# Patient Record
Sex: Female | Born: 1960
Health system: Southern US, Community
[De-identification: ages and names within clinical notes are randomized; demographics above are authoritative.]

## PROBLEM LIST (undated history)

## (undated) DIAGNOSIS — E039 Hypothyroidism, unspecified: Secondary | ICD-10-CM

## (undated) DIAGNOSIS — K604 Rectal fistula, unspecified: Secondary | ICD-10-CM

## (undated) DIAGNOSIS — E079 Disorder of thyroid, unspecified: Secondary | ICD-10-CM

## (undated) DIAGNOSIS — R7989 Other specified abnormal findings of blood chemistry: Secondary | ICD-10-CM

## (undated) DIAGNOSIS — Z5189 Encounter for other specified aftercare: Secondary | ICD-10-CM

## (undated) DIAGNOSIS — N189 Chronic kidney disease, unspecified: Secondary | ICD-10-CM

## (undated) DIAGNOSIS — F319 Bipolar disorder, unspecified: Secondary | ICD-10-CM

## (undated) DIAGNOSIS — A048 Other specified bacterial intestinal infections: Secondary | ICD-10-CM

## (undated) DIAGNOSIS — M7061 Trochanteric bursitis, right hip: Secondary | ICD-10-CM

## (undated) DIAGNOSIS — F32A Depression, unspecified: Secondary | ICD-10-CM

## (undated) HISTORY — DX: Disorder of thyroid, unspecified: E07.9

## (undated) HISTORY — DX: Depression, unspecified: F32.A

## (undated) HISTORY — PX: WISDOM TOOTH EXTRACTION: SHX21

## (undated) HISTORY — PX: HERNIA REPAIR: SHX51

## (undated) HISTORY — PX: LAPAROSCOPIC APPENDECTOMY: SUR753

## (undated) HISTORY — PX: POLYPECTOMY: SHX149

## (undated) HISTORY — PX: VAGINAL HYSTERECTOMY: SUR661

## (undated) HISTORY — DX: Chronic kidney disease, unspecified: N18.9

## (undated) HISTORY — DX: Encounter for other specified aftercare: Z51.89

---

## 2014-06-25 ENCOUNTER — Other Ambulatory Visit: Payer: Self-pay

## 2014-06-25 DIAGNOSIS — R5383 Other fatigue: Secondary | ICD-10-CM | POA: Insufficient documentation

## 2014-06-25 DIAGNOSIS — Z1231 Encounter for screening mammogram for malignant neoplasm of breast: Secondary | ICD-10-CM

## 2014-06-25 DIAGNOSIS — F319 Bipolar disorder, unspecified: Secondary | ICD-10-CM | POA: Insufficient documentation

## 2014-06-25 DIAGNOSIS — M25551 Pain in right hip: Secondary | ICD-10-CM | POA: Insufficient documentation

## 2014-06-25 DIAGNOSIS — M25559 Pain in unspecified hip: Secondary | ICD-10-CM | POA: Insufficient documentation

## 2014-06-25 DIAGNOSIS — E039 Hypothyroidism, unspecified: Secondary | ICD-10-CM | POA: Insufficient documentation

## 2014-06-27 ENCOUNTER — Other Ambulatory Visit: Payer: Self-pay | Admitting: Family Medicine

## 2014-06-27 DIAGNOSIS — R5381 Other malaise: Secondary | ICD-10-CM

## 2014-07-01 ENCOUNTER — Other Ambulatory Visit: Payer: Self-pay | Admitting: Family Medicine

## 2014-07-01 DIAGNOSIS — Z780 Asymptomatic menopausal state: Secondary | ICD-10-CM

## 2014-07-08 ENCOUNTER — Encounter (INDEPENDENT_AMBULATORY_CARE_PROVIDER_SITE_OTHER): Payer: Self-pay

## 2014-07-08 ENCOUNTER — Ambulatory Visit
Admission: RE | Admit: 2014-07-08 | Discharge: 2014-07-08 | Disposition: A | Discharge status: Home / Self Care | Payer: 59 | Source: Ambulatory Visit | Attending: Family Medicine | Admitting: Family Medicine

## 2014-07-08 ENCOUNTER — Ambulatory Visit
Admission: RE | Admit: 2014-07-08 | Discharge: 2014-07-08 | Disposition: A | Discharge status: Home / Self Care | Payer: 59 | Source: Ambulatory Visit

## 2014-07-08 ENCOUNTER — Ambulatory Visit: Payer: Self-pay

## 2014-07-08 DIAGNOSIS — Z780 Asymptomatic menopausal state: Secondary | ICD-10-CM

## 2014-07-08 DIAGNOSIS — Z1231 Encounter for screening mammogram for malignant neoplasm of breast: Secondary | ICD-10-CM

## 2014-07-31 ENCOUNTER — Other Ambulatory Visit: Payer: Self-pay | Admitting: Family Medicine

## 2014-08-26 DIAGNOSIS — R7989 Other specified abnormal findings of blood chemistry: Secondary | ICD-10-CM | POA: Insufficient documentation

## 2014-11-03 ENCOUNTER — Ambulatory Visit (HOSPITAL_BASED_OUTPATIENT_CLINIC_OR_DEPARTMENT_OTHER): Payer: 59 | Attending: Specialist | Admitting: Radiology

## 2014-11-03 VITALS — Ht 69.0 in | Wt 230.0 lb

## 2014-11-03 DIAGNOSIS — R0683 Snoring: Secondary | ICD-10-CM | POA: Insufficient documentation

## 2014-11-08 DIAGNOSIS — R0683 Snoring: Secondary | ICD-10-CM

## 2014-11-08 NOTE — Sleep Study (Signed)
   NAME: Miranda Harris DATE OF BIRTH:  09/17/1961 MEDICAL RECORD NUMBER [id number]  LOCATION: [facility]  PHYSICIAN: Marquize Seib D  DATE OF STUDY: 11/03/2014  SLEEP STUDY TYPE: Nocturnal Polysomnogram               REFERRING PHYSICIAN: Harvie Junior, MD  INDICATION FOR STUDY: Insomnia with sleep apnea  EPWORTH SLEEPINESS SCORE:   1/24 HEIGHT: 5\' 9"  (175.3 cm)  WEIGHT: 230 lb (104.327 kg)    Body mass index is 33.95 kg/(m^2).  NECK SIZE: 14.5 in.  MEDICATIONS: Charted for review  SLEEP ARCHITECTURE: Total sleep time 289 minutes with sleep efficiency 72%. Stage I was 2.1%, stage II 97.9%, stages 3 and REM were absent. Sleep latency 33 minutes, awake after sleep onset 68.5 minutes, arousal index 3.1, bedtime medication: Lithium, Prozac, Stelazine, Xanax, Synthroid  RESPIRATORY DATA: Apnea hypopnea index (AHI) 0.0 per hour. There were no respiratory events meeting scoring criteria.  OXYGEN DATA: Moderate snoring with oxygen desaturation to a nadir of 88% and mean saturation 93.1% on room air  CARDIAC DATA: Normal sinus rhythm  MOVEMENT/PARASOMNIA: No significant movement disturbance, bathroom 3. Reported leg cramps.  IMPRESSION/ RECOMMENDATION:   1) Sleep architecture noteworthy for absence of REM sleep. Sleep was otherwise unremarkable except interrupted 3 for bathroom leg cramps were reported. Bedtime medications as noted above. 2) There were no respiratory events causing sleep disturbance, within normal limits. AHI 0.0 per hour. The normal range for adults is an AHI from 0-5 events per hour. Moderate snoring with oxygen desaturation to a nadir of 88% and mean saturation 93.1% on room air.   Deneise Lever Diplomate, American Board of Sleep Medicine  ELECTRONICALLY SIGNED ON:  11/08/2014, 11:31 AM [place] PH: [phone]   FX: [phone] [place]

## 2014-12-10 DIAGNOSIS — Z8619 Personal history of other infectious and parasitic diseases: Secondary | ICD-10-CM | POA: Insufficient documentation

## 2014-12-10 DIAGNOSIS — Z9289 Personal history of other medical treatment: Secondary | ICD-10-CM | POA: Insufficient documentation

## 2015-04-19 DIAGNOSIS — M7060 Trochanteric bursitis, unspecified hip: Secondary | ICD-10-CM | POA: Insufficient documentation

## 2015-05-20 ENCOUNTER — Encounter: Payer: Self-pay | Admitting: Nurse Practitioner

## 2015-05-26 ENCOUNTER — Ambulatory Visit (INDEPENDENT_AMBULATORY_CARE_PROVIDER_SITE_OTHER): Payer: 59 | Admitting: Psychiatry

## 2015-05-26 ENCOUNTER — Encounter (INDEPENDENT_AMBULATORY_CARE_PROVIDER_SITE_OTHER): Payer: Self-pay

## 2015-05-26 ENCOUNTER — Encounter (HOSPITAL_COMMUNITY): Payer: Self-pay | Admitting: Psychiatry

## 2015-05-26 VITALS — BP 126/80 | HR 68 | Ht 69.0 in | Wt 243.6 lb

## 2015-05-26 DIAGNOSIS — F3160 Bipolar disorder, current episode mixed, unspecified: Secondary | ICD-10-CM | POA: Diagnosis not present

## 2015-05-26 DIAGNOSIS — F419 Anxiety disorder, unspecified: Secondary | ICD-10-CM

## 2015-05-26 DIAGNOSIS — Z79899 Other long term (current) drug therapy: Secondary | ICD-10-CM

## 2015-05-26 DIAGNOSIS — F3161 Bipolar disorder, current episode mixed, mild: Secondary | ICD-10-CM

## 2015-05-26 MED ORDER — TRAZODONE HCL 50 MG PO TABS: ORAL_TABLET | ORAL | Status: DC

## 2015-05-26 MED ORDER — FLUOXETINE HCL 10 MG PO CAPS: ORAL_CAPSULE | ORAL | Status: DC

## 2015-05-26 NOTE — Progress Notes (Signed)
[facility] Initial Assessment Note  Miranda Harris [id number] 54 y.o.  05/26/2015 11:57 AM  Chief Complaint:  I need a new psychiatrist.  My psychiatrist left.  I need medication.  History of Present Illness:  Patient is 54 year old Caucasian, employed, married female who is self-referred for the management of her bipolar disorder and depression.  She was seeing Dr. Candis Sch[facility] at [facility] however her psychiatrist left the practice.  Patient moved from [place] in March 2015 as her husband got a new job in this area.  Patient carry diagnoses of bipolar disorder and depression and she was seeing Dr. Harriette Bouillon in [place] and then Dr. Candis Schatz at [place].  She is taking Xanax , lithium and Stelazine.  She is taking the Stelazine, Prozac and lithium for more than 25 years.  She started taking Xanax four years ago prescribed by her psychiatrist because she could not sleep .  Initially she was taking 2 mg however when she read about Xanax she started cutting down and now she is taking 0.75 mg.  She had tried 0.5 mg which she get very nervous anxious and could not sleep.  Patient is still endorse symptoms of depression, anxiety, low energy, racing thoughts and social isolation but denies any mania or any psychosis.  She is taking Prozac 20 mg, she used to take 40 mg but she felt very restless and decided to take 20 mg.  Patient remember her last psychiatric hospitalization was at age 54 when she was going through severe manic psychosis and depression.  She admitted she was noncompliant with medication and after that she learn the lesson that she has to take the medication.  Says that she's been compliant with lithium and Stelazine.  She's not sure if her lithium level was done recently.  She wants to continue lithium Prozac and Stelazine however she wants to come off from Xanax.  She endorse there are times when she feel sad or depressed mood but denies any feeling of hopelessness or  worthlessness.  She denies any nightmares, flashback, panic attacks, PTSD symptoms or any OCD symptoms.  She lives with her husband who is very supportive.  She has a son who lives in [place].  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  She admitted limited social network and she is trying to make friends .  She goes to primary care physician [name] who is managing her medication for her health needs.  Patient denies any mania in recent years.  Patient denies drinking or using any illegal substances.  Her appetite is okay.  Her vitals are stable.  She has no tremors or shakes.    Suicidal Ideation: No Plan Formed: No Patient has means to carry out plan: No  Homicidal Ideation: No Plan Formed: No Patient has means to carry out plan: No  Past Psychiatric History/Hospitalization(s): Patient remember first hospitalization at age 54 at that time she has severe manic psychosis and depression.  She was hyperverbal, extreme mood swing, sexually promiscuous, paranoia, hallucination and having suicidal thoughts.  She denies any suicidal attempt in the past.  She was admitted in [place] but soon after admission she stopped taking her medication and age 54 she admitted to gain for same symptoms.  Since then she's been taking her medication most of the time.  In the past she had tried Abilify which causes flulike symptoms, Lunesta causes sedation and tired feeling, Ambien which causes sleepwalking, driving and eating and  the night, Haldol and Thorazine which causes EPS and shakes.  She's been taking Stelazine and lithium for more than 25 years and she reported no side effects.  She was seeing Dr. Harriette Bouillon in [place] and Dr. Candis Schatz at [facility].  She was getting Xanax in [place] for insomnia and she was taking 2 mg until recently she has been trying to cut down the dose. Anxiety: Yes Bipolar Disorder: Yes Depression: Yes Mania: Yes Psychosis:  Yes Schizophrenia: No Personality Disorder: No Hospitalization for psychiatric illness: Yes History of Electroconvulsive Shock Therapy: No Prior Suicide Attempts: No  Medical History; Patient has hypothyroidism, bladder problem and chronic pain.  She see [name] for her primary care needs.  Traumatic brain injury: Patient denies any history of traumatic brain injury.  Family History; Patient endorse mother has bipolar-like symptoms are she never diagnosed.  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  Psychosocial History; Patient born and raised in [place].  She lived in [place], [place] and recently [place].  Most of the time she moved due to her husband's job.  Together they have 88 year old son who lives in [place].  Patient recently moved to this area and she has limited social network.  Legal History; Patient denies any legal issues.  History Of Abuse; Patient denies any history of abuse.  Substance Abuse History; Patient endorse distant history of drinking alcohol heavily however claims to be sober for many years.  She denies any withdrawal symptoms including tremors, shakes or any seizures.  Review of Systems: Psychiatric: Agitation: No Hallucination: No Depressed Mood: Yes Insomnia: No Hypersomnia: No Altered Concentration: No Feels Worthless: No Grandiose Ideas: No Belief In Special Powers: No New/Increased Substance Abuse: No Compulsions: No  Neurologic: Headache: No Seizure: No Paresthesias: No   Outpatient Encounter Prescriptions as of 05/26/2015  Medication Sig  . FLUoxetine (PROZAC) 10 MG capsule Take 3 capsule daily  . ibuprofen (ADVIL,MOTRIN) 800 MG tablet Take 800 mg by mouth every 8 (eight) hours as needed. for pain  . levothyroxine (SYNTHROID, LEVOTHROID) 150 MCG tablet   . lithium 300 MG tablet Take 1,200 mg by mouth at bedtime.  . traZODone (DESYREL) 50 MG  tablet Take 1/2 to 1 tab at bed time  . trifluoperazine (STELAZINE) 2 MG tablet Take 2 mg by mouth at bedtime.  . [DISCONTINUED] FLUoxetine (PROZAC) 20 MG capsule Take 20 mg by mouth at bedtime.   No facility-administered encounter medications on file as of 05/26/2015.    No results found for this or any previous visit (from the past 2160 hour(s)).    Constitutional:  BP 126/80 mmHg  Pulse 68  Ht 5\' 9"  (1.753 m)  Wt 243 lb 9.6 oz (110.496 kg)  BMI 35.96 kg/m2   Musculoskeletal: Strength & Muscle Tone: within normal limits Gait & Station: normal Patient leans: N/A  Psychiatric Specialty Exam: General Appearance: Casual  Eye Contact::  Good  Speech:  Slow  Volume:  Decreased  Mood:  Anxious and Depressed  Affect:  Congruent  Thought Process:  Coherent  Orientation:  Full (Time, Place, and Person)  Thought Content:  WDL  Suicidal Thoughts:  No  Homicidal Thoughts:  No  Memory:  Immediate;   Fair Recent;   Fair Remote;   Fair  Judgement:  Fair  Insight:  Good  Psychomotor Activity:  Normal  Concentration:  Good  Recall:  Good  Fund of Knowledge:  Good  Language:  Good  Akathisia:  No  Handed:  Right  AIMS (if indicated):     Assets:  Communication Skills Desire for Improvement Financial Resources/Insurance Housing Physical Health Social Support  ADL's:  Intact  Cognition:  WNL  Sleep:        Established Problem, Stable/Improving (1), Review of Psycho-Social Stressors (1), Review or order clinical lab tests (1), Decision to obtain old records (1), Review and summation of old records (2), Established Problem, Worsening (2), New Problem, with no additional work-up planned (3), Review of Medication Regimen & Side Effects (2) and Review of New Medication or Change in Dosage (2)  Assessment: Axis I: Bipolar disorder, mixed.  Anxiety disorder NOS.  Axis II: Deferred  Axis III:  Past Medical History  Diagnosis Date  . Thyroid disease      Plan:  I review  her symptoms, history, psychosocial stressors, current medication, blood work from primary care physician.  She has mild elevation of BUN however there were no lithium level done.  CBC is normal.  Discussed medication in detail.  Patient wants to come off from Xanax.  Currently she is taking Xanax 0.75 mg, she used to take 2 mg and now she read about benzodiazepine dependence she wants to come off slowly.  She had never tried trazodone before.  In the past she had tried Ambien, Lunesta, but side effects.  I recommended to try trazodone 50 mg half to one tablet to help insomnia and anxiety.  I also recommended increased Prozac 30 mg since she had tried 40 mg causing restlessness.  At this time continue lithium 1200 mg a day.  Patient has no side effects however we will do a lithium level along with him ability A1c and comprehensive metabolic panel.  Encourage to think about seeing a therapist but patient like to take some time to make a decision.  Discussed medication side effects and benefits.  We will get records from Dr. Candis Schatz.  Recommended to call us back if she has any question or any concern.  I will see her again in 3 weeks.  Discuss safety plan that anytime having active suicidal thoughts or homicidal thoughts and she need to call 911 or go to the local emergency room.  Zarek Relph T., MD 05/26/2015

## 2015-06-02 ENCOUNTER — Ambulatory Visit (HOSPITAL_COMMUNITY): Payer: 59 | Admitting: Psychiatry

## 2015-06-11 LAB — HEMOGLOBIN A1C
Hgb A1c MFr Bld: 5.4 % (ref <5.7)
MEAN PLASMA GLUCOSE: 108 mg/dL (ref <117)

## 2015-06-11 LAB — LITHIUM LEVEL: LITHIUM LVL: 0.8 meq/L (ref 0.80–1.40)

## 2015-06-12 LAB — COMPLETE METABOLIC PANEL WITH GFR
ALT: 20 U/L (ref 6–29)
AST: 20 U/L (ref 10–35)
Albumin: 4 g/dL (ref 3.6–5.1)
Alkaline Phosphatase: 79 U/L (ref 33–130)
BUN: 19 mg/dL (ref 7–25)
CHLORIDE: 109 mmol/L (ref 98–110)
CO2: 22 mmol/L (ref 20–31)
Calcium: 10.1 mg/dL (ref 8.6–10.4)
Creat: 1.04 mg/dL (ref 0.50–1.05)
GFR, EST NON AFRICAN AMERICAN: 61 mL/min (ref >=60)
GFR, Est African American: 70 mL/min (ref >=60)
GLUCOSE: 76 mg/dL (ref 65–99)
POTASSIUM: 4.6 mmol/L (ref 3.5–5.3)
SODIUM: 138 mmol/L (ref 135–146)
Total Bilirubin: 0.4 mg/dL (ref 0.2–1.2)
Total Protein: 6.2 g/dL (ref 6.1–8.1)

## 2015-06-19 ENCOUNTER — Ambulatory Visit (INDEPENDENT_AMBULATORY_CARE_PROVIDER_SITE_OTHER): Payer: 59 | Admitting: Psychiatry

## 2015-06-19 ENCOUNTER — Encounter (HOSPITAL_COMMUNITY): Payer: Self-pay | Admitting: Psychiatry

## 2015-06-19 VITALS — BP 122/84 | HR 63 | Ht 69.0 in | Wt 245.4 lb

## 2015-06-19 DIAGNOSIS — F419 Anxiety disorder, unspecified: Secondary | ICD-10-CM | POA: Diagnosis not present

## 2015-06-19 DIAGNOSIS — F3160 Bipolar disorder, current episode mixed, unspecified: Secondary | ICD-10-CM | POA: Diagnosis not present

## 2015-06-19 DIAGNOSIS — F3161 Bipolar disorder, current episode mixed, mild: Secondary | ICD-10-CM

## 2015-06-19 MED ORDER — ALPRAZOLAM 0.5 MG PO TABS: 0.5000 mg | ORAL_TABLET | Freq: Every evening | ORAL | Status: DC | PRN

## 2015-06-19 MED ORDER — HYDROXYZINE HCL 25 MG PO TABS: 25.0000 mg | ORAL_TABLET | Freq: Every day | ORAL | Status: DC

## 2015-06-19 MED ORDER — TRIFLUOPERAZINE HCL 2 MG PO TABS: 2.0000 mg | ORAL_TABLET | Freq: Every day | ORAL | Status: DC

## 2015-06-19 MED ORDER — FLUOXETINE HCL 10 MG PO CAPS: ORAL_CAPSULE | ORAL | Status: DC

## 2015-06-19 MED ORDER — LITHIUM CARBONATE 300 MG PO TABS: 1200.0000 mg | ORAL_TABLET | Freq: Every day | ORAL | Status: DC

## 2015-06-19 NOTE — Progress Notes (Signed)
Miranda Harris [phone] Progress Note  Miranda Harris [id number] 54 y.o.  06/19/2015 12:09 PM  Chief Complaint:  I cannot take trazodone.  It is making him very drowsy in the morning.    History of Present Illness:  Miranda Harris came for her follow-up appointment.  She is a 54 year old Caucasian employed married female who was seen first time on August 30 as initial evaluation.  Patient has history of bipolar disorder and she was seeing Dr. Candis Schatz who recently left the practice.  She was taking Xanax 0.75 mg, lithium 1200 mg, Stelazine 2 mg and Prozac 20 mg.  Despite taking these medication she continued to have anxiety, depression, sadness, decreased energy and racing thoughts.  We have suggested to increase Prozac 30 mg.  She wanted to stop Xanax but she was worried about her poor sleep and we recommended to try trazodone.  She tried trazodone 50 mg half tablet but she complained excessive sedation and drowsiness in the morning.  She tried for 7 days and she could not tolerate and decided to stop the trazodone.  She restarted taking Xanax 0.75 mg and she is not happy about it.  She wants to stop because she felt that she is getting addicted and feeling dependent on Xanax.  In the past she had tried Ambien and Lunesta but it causes sleepwalking, driving and eating in the nighttime.  She like increase Prozac because it helped her depression and energy level.  She also had blood work and her lithium level is 0.80, hemoglobin A1c 5.4 and her basic chemistry is normal.  Patient denies any paranoia or any hallucination.  She lives with her husband who is very supportive.  Her son lives in [place].  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  Patient denies any feeling of hopelessness or worthlessness.  Patient denies any hallucination paranoia or any active or passive suicidal thoughts.  Her appetite is  okay.  Her vitals are stable.  Suicidal Ideation: No Plan Formed: No Patient has means to carry out plan: No  Homicidal Ideation: No Plan Formed: No Patient has means to carry out plan: No  Past Psychiatric History/Hospitalization(s): Patient has hospitalization at age 26 due to mania and psychosis and depression.  She was hyperverbal, extreme mood swing, sexually promiscuous, paranoia, hallucination and having suicidal thoughts.  She has another episode at age 68 due to noncompliance with medication.  Since then she is taking her medication most of the time.  In the past she had tried Abilify which causes flulike symptoms, Lunesta causes sedation and tired feeling, Ambien which causes sleepwalking, driving and eating and the night, Haldol and Thorazine which causes EPS and shakes.  She's been taking Stelazine and lithium for more than 25 years and she reported no side effects.  She was seeing Dr. Harriette Bouillon in [place] and Dr. [facility]is Schatz at [facility].  She was getting Xanax in [place] for insomnia and she was taking 2 mg until recently she has been trying to cut down the dose. Anxiety: Yes Bipolar Disorder: Yes Depression: Yes Mania: Yes Psychosis: Yes Schizophrenia: No Personality Disorder: No Hospitalization for psychiatric illness: Yes History of Electroconvulsive Shock Therapy: No Prior Suicide Attempts: No  Medical History; Patient has hypothyroidism, bladder problem and chronic pain.  She see [name] for her primary care needs.  Family History; Patient endorse mother has bipolar-like symptoms are she never diagnosed.  Substance  Abuse History; Patient endorse distant history of drinking alcohol heavily however claims to be sober for many years.  She denies any withdrawal symptoms including tremors, shakes or any seizures.  Review of Systems: Psychiatric: Agitation: No Hallucination: No Depressed Mood: No Insomnia: No Hypersomnia: No Altered Concentration: No Feels  Worthless: No Grandiose Ideas: No Belief In Special Powers: No New/Increased Substance Abuse: No Compulsions: No  Neurologic: Headache: No Seizure: No Paresthesias: No   Outpatient Encounter Prescriptions as of 06/19/2015  Medication Sig  . FLUoxetine (PROZAC) 10 MG capsule Take 3 capsule daily  . ibuprofen (ADVIL,MOTRIN) 800 MG tablet Take 800 mg by mouth every 8 (eight) hours as needed. for pain  . levothyroxine (SYNTHROID, LEVOTHROID) 150 MCG tablet   . lithium 300 MG tablet Take 4 tablets (1,200 mg total) by mouth at bedtime.  [name] trifluoperazine (STELAZINE) 2 MG tablet Take 1 tablet (2 mg total) by mouth at bedtime.  . [DISCONTINUED] FLUoxetine (PROZAC) 10 MG capsule Take 3 capsule daily  . [DISCONTINUED] lithium 300 MG tablet Take 1,200 mg by mouth at bedtime.  . [DISCONTINUED] trifluoperazine (STELAZINE) 2 MG tablet Take 2 mg by mouth at bedtime.  . ALPRAZolam (XANAX) 0.5 MG tablet Take 1 tablet (0.5 mg total) by mouth at bedtime as needed for anxiety.  . hydrOXYzine (ATARAX/VISTARIL) 25 MG tablet Take 1 tablet (25 mg total) by mouth at bedtime.  . [DISCONTINUED] traZODone (DESYREL) 50 MG tablet Take 1/2 to 1 tab at bed time (Patient not taking: Reported on 06/19/2015)   No facility-administered encounter medications on file as of 06/19/2015.    Recent Results (from the past 2160 hour(s))  Lithium level     Status: None   Collection Time: 06/10/15 11:56 AM  Result Value Ref Range   Lithium Lvl 0.80 0.80 - 1.40 mEq/L    Comment:   Footnotes:  (1) ** Please note change in unit of measure and reference range(s). **     Hemoglobin A1c     Status: None   Collection Time: 06/10/15 11:56 AM  Result Value Ref Range   Hgb A1c MFr Bld 5.4 <5.7 %    Comment:                                                                        According to the ADA Clinical Practice Recommendations for 2011, when HbA1c is used as a screening test:     >=6.5%   Diagnostic of Diabetes Mellitus             (if abnormal result is confirmed)   5.7-6.4%   Increased risk of developing Diabetes Mellitus   References:Diagnosis and Classification of Diabetes Mellitus,Diabetes ZYYQ,8250,03(BCWUG 1):S62-S69 and Standards of Medical Care in         Diabetes - 2011,Diabetes Care,2011,34 (Suppl 1):S11-S61.      Mean Plasma Glucose 108 <117 mg/dL  COMPLETE METABOLIC PANEL WITH GFR     Status: None   Collection Time: 06/10/15 11:56 AM  Result Value Ref Range   Sodium 138 135 - 146 mmol/L   Potassium 4.6 3.5 - 5.3 mmol/L   Chloride 109 98 - 110 mmol/L   CO2 22 20 - 31 mmol/L   Glucose, Bld 76 65 -  99 mg/dL   BUN 19 7 - 25 mg/dL   Creat 1.04 0.50 - 1.05 mg/dL   Total Bilirubin 0.4 0.2 - 1.2 mg/dL   Alkaline Phosphatase 79 33 - 130 U/L   AST 20 10 - 35 U/L   ALT 20 6 - 29 U/L   Total Protein 6.2 6.1 - 8.1 g/dL   Albumin 4.0 3.6 - 5.1 g/dL   Calcium 10.1 8.6 - 10.4 mg/dL   GFR, Est African American 70 >=60 mL/min   GFR, Est Non African American 61 >=60 mL/min    Comment:   The estimated GFR is a calculation valid for adults (>=45 years old) that uses the CKD-EPI algorithm to adjust for age and sex. It is   not to be used for children, pregnant women, hospitalized patients,    patients on dialysis, or with rapidly changing kidney function. According to the NKDEP, eGFR >89 is normal, 60-89 shows mild impairment, 30-59 shows moderate impairment, 15-29 shows severe impairment and <15 is ESRD.         Constitutional:  BP 122/84 mmHg  Pulse 63  Ht 5' 9"  (1.753 m)  Wt 245 lb 6.4 oz (111.313 kg)  BMI 36.22 kg/m2   Musculoskeletal: Strength & Muscle Tone: within normal limits Gait & Station: normal Patient leans: N/A  Psychiatric Specialty Exam: General Appearance: Casual  Eye Contact::  Good  Speech:  Slow  Volume:  Decreased  Mood:  Anxious  Affect:  Congruent  Thought Process:  Coherent  Orientation:  Full (Time, Place, and Person)  Thought Content:  WDL  Suicidal  Thoughts:  No  Homicidal Thoughts:  No  Memory:  Immediate;   Fair Recent;   Fair Remote;   Fair  Judgement:  Fair  Insight:  Good  Psychomotor Activity:  Normal  Concentration:  Good  Recall:  Good  Fund of Knowledge:  Good  Language:  Good  Akathisia:  No  Handed:  Right  AIMS (if indicated):     Assets:  Communication Skills Desire for Improvement Financial Resources/Insurance Housing Physical Health Social Support  ADL's:  Intact  Cognition:  WNL  Sleep:        Established Problem, Stable/Improving (1), Review of Psycho-Social Stressors (1), Review or order clinical lab tests (1), Review and summation of old records (2), Review of Last Therapy Session (1), Review of Medication Regimen & Side Effects (2) and Review of New Medication or Change in Dosage (2)  Assessment: Axis I: Bipolar disorder, mixed.  Anxiety disorder NOS.  Axis II: Deferred  Axis III:  Past Medical History  Diagnosis Date  . Thyroid disease      Plan:  I review her chart, current medication and blood work results.  Her lithium level is normal.  Her hemoglobin A1c and CBC chemistry is also normal.  She has no tremors, shakes or any EPS.  We have a long and lengthy discussion about benzodiazepine dependence and tolerance.  Patient like to come off from Xanax however she need something to help her sleep at night.  We tried trazodone but it costs too with sedation in the morning and interfere with her job.  I will discontinue trazodone and we will try low-dose Vistaril which she has never tried before.  We will start 25 mg at bedtime.  I also recommended if she cannot sleep with the Vistaril that she can take 0.5 mg Xanax for now.  I explained that she need to come off slowly from benzodiazepine.  Continue lithium 1200 mg daily, Stelazine 2 mg at bedtime and Prozac 30 mg daily.  Recommended to call us back if she has any question or any concern.  At this time patient is not interested to see a counselor.   We are still awaiting records from Dr. Dayle Points office.  follow-up in 4 weeks.  Discuss safety plan that anytime having active suicidal thoughts or homicidal thoughts and she need to call 911 or go to the local emergency room.  ARFEEN,SYED T., MD 06/19/2015

## 2015-06-25 ENCOUNTER — Other Ambulatory Visit (HOSPITAL_COMMUNITY): Payer: Self-pay | Admitting: Psychiatry

## 2015-06-25 NOTE — Telephone Encounter (Signed)
It was discontinued on 06/19/15.

## 2015-07-14 ENCOUNTER — Encounter (HOSPITAL_COMMUNITY): Payer: Self-pay | Admitting: Psychiatry

## 2015-07-14 ENCOUNTER — Ambulatory Visit (INDEPENDENT_AMBULATORY_CARE_PROVIDER_SITE_OTHER): Payer: 59 | Admitting: Psychiatry

## 2015-07-14 VITALS — BP 130/84 | HR 75 | Ht 69.0 in | Wt 248.6 lb

## 2015-07-14 DIAGNOSIS — F3161 Bipolar disorder, current episode mixed, mild: Secondary | ICD-10-CM

## 2015-07-14 DIAGNOSIS — F419 Anxiety disorder, unspecified: Secondary | ICD-10-CM

## 2015-07-14 MED ORDER — ALPRAZOLAM 0.5 MG PO TABS: 0.5000 mg | ORAL_TABLET | Freq: Every evening | ORAL | Status: DC | PRN

## 2015-07-14 MED ORDER — HYDROXYZINE HCL 25 MG PO TABS: ORAL_TABLET | ORAL | Status: DC

## 2015-07-14 MED ORDER — LITHIUM CARBONATE 300 MG PO TABS: 1200.0000 mg | ORAL_TABLET | Freq: Every day | ORAL | Status: DC

## 2015-07-14 MED ORDER — FLUOXETINE HCL 10 MG PO CAPS: ORAL_CAPSULE | ORAL | Status: DC

## 2015-07-14 MED ORDER — TRIFLUOPERAZINE HCL 2 MG PO TABS: 2.0000 mg | ORAL_TABLET | Freq: Every day | ORAL | Status: DC

## 2015-07-14 NOTE — Progress Notes (Signed)
[facility] Progress Note  Miranda Harris [id number] 54 y.o.  07/14/2015 4:35 PM  Chief Complaint:  I tried Vistaril 50 mg but it did not help sleep.  I still have to take Xanax.  I want to come off from Xanax.   History of Present Illness:  Miranda Harris came for her follow-up appointment.  On her last visit we recommended to try Vistaril which she took up to 50 mg along with Xanax 0.5 mg but she did not see any improvement.  However she did not have any side effects either.  She wants to come off from Xanax.  She denies any paranoia, hallucination, irritability.  She admitted anxiety and racing thoughts because of insomnia.  She denies any side effects of medication.  In the past she had tried multiple medication with limited response.  She like increase Prozac which is helping her depression.  She has no side effects.  She denies any feeling of hopelessness or worthlessness.  Her job is going very well.  She denies any hallucination or any paranoia.  She denies any mania or any suicidal thoughts.  She is compliant with Stelazine.  Her vitals are okay.  Her appetite is okay.  She denies drinking or using any illegal substances.  Suicidal Ideation: No Plan Formed: No Patient has means to carry out plan: No  Homicidal Ideation: No Plan Formed: No Patient has means to carry out plan: No  Past Psychiatric History/Hospitalization(s): Patient has hospitalization at age 47 due to mania and psychosis and depression.  She was hyperverbal, extreme mood swing, sexually promiscuous, paranoia, hallucination and having suicidal thoughts.  She has another episode at age 89 due to noncompliance with medication.  Since then she is taking her medication most of the time.  In the past she had tried Abilify which causes flulike symptoms, Lunesta causes sedation and tired feeling, Ambien which causes sleepwalking, driving and eating and the night, Haldol and Thorazine which causes EPS and shakes and  trazodone which cause excessive sedation.[name]  She's been taking Stelazine and lithium for more than 25 years and she reported no side effects.  She was seeing Dr. Harriette Bouillon in [place] and [facility].  She was getting Xanax in [place] for insomnia and she was taking 2 mg until recently she has been trying to cut down the dose. Anxiety: Yes Bipolar Disorder: Yes Depression: Yes Mania: Yes Psychosis: Yes Schizophrenia: No Personality Disorder: No Hospitalization for psychiatric illness: Yes History of Electroconvulsive Shock Therapy: No Prior Suicide Attempts: No  Medical History; Patient has hypothyroidism, bladder problem and chronic pain.  She see [name] for her primary care needs.  Family History; Patient endorse mother has bipolar-like symptoms are she never diagnosed.  Substance Abuse History; Patient endorse distant history of drinking alcohol heavily however claims to be sober for many years.  She denies any withdrawal symptoms including tremors, shakes or any seizures.  Review of Systems: Psychiatric: Agitation: No Hallucination: No Depressed Mood: No Insomnia: No Hypersomnia: No Altered Concentration: No Feels Worthless: No Grandiose Ideas: No Belief In Special Powers: No New/Increased Substance Abuse: No Compulsions: No  Neurologic: Headache: No Seizure: No Paresthesias: No   Outpatient Encounter Prescriptions as of 07/14/2015  Medication Sig  . ALPRAZolam (XANAX) 0.5 MG tablet Take 1 tablet (0.5 mg total) by mouth at bedtime as needed for anxiety.  [name] FLUoxetine (PROZAC) 10 MG capsule Take 3 capsule daily  . hydrOXYzine (ATARAX/VISTARIL) 25 MG tablet Take 3-4 at bed time  .  ibuprofen (ADVIL,MOTRIN) 800 MG tablet Take 800 mg by mouth every 8 (eight) hours as needed. for pain  . levothyroxine (SYNTHROID, LEVOTHROID) 150 MCG tablet   . lithium 300 MG tablet Take 4 tablets (1,200 mg total) by mouth at bedtime.  [name] trifluoperazine  (STELAZINE) 2 MG tablet Take 1 tablet (2 mg total) by mouth at bedtime.  . [DISCONTINUED] ALPRAZolam (XANAX) 0.5 MG tablet Take 1 tablet (0.5 mg total) by mouth at bedtime as needed for anxiety.  . [DISCONTINUED] FLUoxetine (PROZAC) 10 MG capsule Take 3 capsule daily  . [DISCONTINUED] hydrOXYzine (ATARAX/VISTARIL) 25 MG tablet Take 1 tablet (25 mg total) by mouth at bedtime.  . [DISCONTINUED] lithium 300 MG tablet Take 4 tablets (1,200 mg total) by mouth at bedtime.  . [DISCONTINUED] trifluoperazine (STELAZINE) 2 MG tablet Take 1 tablet (2 mg total) by mouth at bedtime.   No facility-administered encounter medications on file as of 07/14/2015.    Recent Results (from the past 2160 hour(s))  Lithium level     Status: None   Collection Time: 06/10/15 11:56 AM  Result Value Ref Range   Lithium Lvl 0.80 0.80 - 1.40 mEq/L    Comment:   Footnotes:  (1) ** Please note change in unit of measure and reference range(s). **     Hemoglobin A1c     Status: None   Collection Time: 06/10/15 11:56 AM  Result Value Ref Range   Hgb A1c MFr Bld 5.4 <5.7 %    Comment:                                                                        According to the ADA Clinical Practice Recommendations for 2011, when HbA1c is used as a screening test:     >=6.5%   Diagnostic of Diabetes Mellitus            (if abnormal result is confirmed)   5.7-6.4%   Increased risk of developing Diabetes Mellitus   References:Diagnosis and Classification of Diabetes Mellitus,Diabetes LAGT,3646,80(HOZYY 1):S62-S69 and Standards of Medical Care in         Diabetes - 2011,Diabetes Care,2011,34 (Suppl 1):S11-S61.      Mean Plasma Glucose 108 <117 mg/dL  COMPLETE METABOLIC PANEL WITH GFR     Status: None   Collection Time: 06/10/15 11:56 AM  Result Value Ref Range   Sodium 138 135 - 146 mmol/L   Potassium 4.6 3.5 - 5.3 mmol/L   Chloride 109 98 - 110 mmol/L   CO2 22 20 - 31 mmol/L   Glucose, Bld 76 65 - 99 mg/dL   BUN  19 7 - 25 mg/dL   Creat 1.04 0.50 - 1.05 mg/dL   Total Bilirubin 0.4 0.2 - 1.2 mg/dL   Alkaline Phosphatase 79 33 - 130 U/L   AST 20 10 - 35 U/L   ALT 20 6 - 29 U/L   Total Protein 6.2 6.1 - 8.1 g/dL   Albumin 4.0 3.6 - 5.1 g/dL   Calcium 10.1 8.6 - 10.4 mg/dL   GFR, Est African American 70 >=60 mL/min   GFR, Est Non African American 61 >=60 mL/min    Comment:   The estimated GFR is a calculation valid for adults (>=18  years old) that uses the CKD-EPI algorithm to adjust for age and sex. It is   not to be used for children, pregnant women, hospitalized patients,    patients on dialysis, or with rapidly changing kidney function. According to the NKDEP, eGFR >89 is normal, 60-89 shows mild impairment, 30-59 shows moderate impairment, 15-29 shows severe impairment and <15 is ESRD.         Constitutional:  BP 130/84 mmHg  Pulse 75  Ht 5' 9" (1.753 m)  Wt 248 lb 9.6 oz (112.764 kg)  BMI 36.69 kg/m2   Musculoskeletal: Strength & Muscle Tone: within normal limits Gait & Station: normal Patient leans: N/A  Psychiatric Specialty Exam: General Appearance: Casual  Eye Contact::  Good  Speech:  Slow  Volume:  Decreased  Mood:  Anxious  Affect:  Congruent  Thought Process:  Coherent  Orientation:  Full (Time, Place, and Person)  Thought Content:  WDL  Suicidal Thoughts:  No  Homicidal Thoughts:  No  Memory:  Immediate;   Fair Recent;   Fair Remote;   Fair  Judgement:  Fair  Insight:  Good  Psychomotor Activity:  Normal  Concentration:  Good  Recall:  Good  Fund of Knowledge:  Good  Language:  Good  Akathisia:  No  Handed:  Right  AIMS (if indicated):     Assets:  Communication Skills Desire for Improvement Financial Resources/Insurance Housing Physical Health Social Support  ADL's:  Intact  Cognition:  WNL  Sleep:        Established Problem, Stable/Improving (1), Review of Psycho-Social Stressors (1), Review of Last Therapy Session (1), Review of  Medication Regimen & Side Effects (2) and Review of New Medication or Change in Dosage (2)  Assessment: Axis I: Bipolar disorder, mixed.  Anxiety disorder NOS.  Axis II: Deferred  Axis III:  Past Medical History  Diagnosis Date  . Thyroid disease      Plan:  I recommended to increase Vistaril up to 75-100 mg at bedtime and takes Xanax 0.5 mg half to one tablet as needed.  Discussed benzodiazepine withdrawals, dependence and tolerance.  She wants to come off from Xanax eventually.  Continue lithium 1200 mg daily, Stelazine 2 mg at bedtime and Prozac 30 mg daily.  Recommended to call us back if she has any question or any concern.  At this time patient is not interested to see a counselor.  Follow-up in 6 weeks.  Discuss safety plan that anytime having active suicidal thoughts or homicidal thoughts and she need to call 911 or go to the local emergency room.  Kelty Szafran T., MD 07/14/2015

## 2015-07-16 ENCOUNTER — Telehealth (HOSPITAL_COMMUNITY): Payer: Self-pay

## 2015-07-16 NOTE — Telephone Encounter (Signed)
Medication management - Telephone call with pt. to follow up on stated needed new Alprazolam prescription.  Informed pt. this was given to her on 07/14/15 when evaluated and pt. stated she was not sure if she had thrown it away by mistake.  Informed patient she should look for the prescription and let this nurse know if it is not found.  Agreed would inform Dr. Adele Schilder she is looking for the prescription and would call our office back.

## 2015-07-20 ENCOUNTER — Ambulatory Visit (HOSPITAL_COMMUNITY): Payer: Self-pay | Admitting: Psychiatry

## 2015-08-17 ENCOUNTER — Other Ambulatory Visit: Payer: Self-pay

## 2015-08-17 DIAGNOSIS — Z1231 Encounter for screening mammogram for malignant neoplasm of breast: Secondary | ICD-10-CM

## 2015-08-26 ENCOUNTER — Encounter (HOSPITAL_COMMUNITY): Payer: Self-pay | Admitting: Psychiatry

## 2015-08-26 ENCOUNTER — Ambulatory Visit (INDEPENDENT_AMBULATORY_CARE_PROVIDER_SITE_OTHER): Payer: 59 | Admitting: Psychiatry

## 2015-08-26 VITALS — BP 131/78 | HR 98 | Ht 69.0 in | Wt 240.0 lb

## 2015-08-26 DIAGNOSIS — F419 Anxiety disorder, unspecified: Secondary | ICD-10-CM

## 2015-08-26 DIAGNOSIS — F3161 Bipolar disorder, current episode mixed, mild: Secondary | ICD-10-CM | POA: Diagnosis not present

## 2015-08-26 MED ORDER — ALPRAZOLAM 0.5 MG PO TABS: 0.5000 mg | ORAL_TABLET | Freq: Every evening | ORAL | Status: DC | PRN

## 2015-08-26 MED ORDER — ESCITALOPRAM OXALATE 10 MG PO TABS
10.0000 mg | ORAL_TABLET | Freq: Every day | ORAL | Status: DC
Start: 2015-08-26 — End: 2015-10-07

## 2015-08-26 MED ORDER — LITHIUM CARBONATE 300 MG PO TABS: 1200.0000 mg | ORAL_TABLET | Freq: Every day | ORAL | Status: DC

## 2015-08-26 MED ORDER — HYDROXYZINE HCL 25 MG PO TABS: ORAL_TABLET | ORAL | Status: DC

## 2015-08-26 MED ORDER — TRIFLUOPERAZINE HCL 2 MG PO TABS: 2.0000 mg | ORAL_TABLET | Freq: Every day | ORAL | Status: DC

## 2015-08-26 NOTE — Progress Notes (Signed)
[facility] [phone] Progress Note  Miranda Harris [id number] 54 y.o.  08/26/2015 5:11 PM  Chief Complaint:  I tried coming off from Xanax but I cannot sleep.  I think I'm getting depressed.  I don't think Prozac working.    History of Present Illness:  Miranda Harris came for her follow-up appointment.  She tried coming off from Xanax but she could not sleep and decided to have hallucination and she got scared and now she is taking Xanax 0.5 mg along with Vistaril 75 mg at bedtime.  She is relieved that her sleep is improved but she still have depression and having crying spells.  Sometimes she feel isolated, withdrawn and hopeless.  She is not sure what trigger the depression .  She admitted some time having passive and fleeting suicidal thoughts but denies any plan or any intent.  She has no concerned about her job which is going very well.  She had a good Thanksgiving.  She has no tremors or shakes.  She does not feel Prozac working and she is willing to try a different antidepressant.  Her appetite is okay.  Her vitals are stable.  She denies any mania or any delusions.  Patient denies drinking or using any illegal substances.    Suicidal Ideation: Patient endorse fleeting and passive suicidal thoughts but no plan. Plan Formed: No Patient has means to carry out plan: No  Homicidal Ideation: No Plan Formed: No Patient has means to carry out plan: No  Past Psychiatric History/Hospitalization(s): Patient has hospitalization at age 34 due to mania and psychosis and depression.  She was hyperverbal, extreme mood swing, sexually promiscuous, paranoia, hallucination and having suicidal thoughts.  She has another episode at age 56 due to noncompliance with medication.  Since then she is taking her medication most of the time.  In the past she had tried Abilify which causes flulike symptoms, Lunesta causes sedation and tired feeling, Ambien which causes sleepwalking, driving and eating and the night,  Haldol and Thorazine which causes EPS and shakes and trazodone which cause excessive sedation.[name]  She's been taking Stelazine and lithium for more than 25 years and she reported no side effects.  She had tried Cymbalta, Zoloft and Wellbutrin but limited response.  She was seeing Dr. Harriette Bouillon in [place] and [facility].  She was getting Xanax in [place] for insomnia and she was taking 2 mg until recently she has been trying to cut down the dose. Anxiety: Yes Bipolar Disorder: Yes Depression: Yes Mania: Yes Psychosis: Yes Schizophrenia: No Personality Disorder: No Hospitalization for psychiatric illness: Yes History of Electroconvulsive Shock Therapy: No Prior Suicide Attempts: No  Medical History; Patient has hypothyroidism, bladder problem and chronic pain.  She see [name] for her primary care needs.  Family History; Patient endorse mother has bipolar-like symptoms but never diagnosed.  Substance Abuse History; Patient endorse distant history of drinking alcohol heavily however claims to be sober for many years.  She denies any withdrawal symptoms including tremors, shakes or any seizures.  Review of Systems  Constitutional: Negative.   Cardiovascular: Negative for chest pain.  Musculoskeletal: Negative.   Skin: Negative for itching and rash.  Neurological: Negative for dizziness, tremors and headaches.  Psychiatric/Behavioral: Positive for depression. The patient has insomnia.     Psychiatric: Agitation: No Hallucination: No Depressed Mood: Yes Insomnia: No Hypersomnia: No Altered Concentration: No Feels Worthless: Yes Grandiose Ideas: No Belief In Special Powers: No New/Increased Substance Abuse: No Compulsions: No  Neurologic:  Headache: No Seizure: No Paresthesias: No   Outpatient Encounter Prescriptions as of 08/26/2015  Medication Sig  . ALPRAZolam (XANAX) 0.5 MG tablet Take 1 tablet (0.5 mg total) by mouth at bedtime as needed for  anxiety.  [name] escitalopram (LEXAPRO) 10 MG tablet Take 1 tablet (10 mg total) by mouth daily.  . hydrOXYzine (ATARAX/VISTARIL) 25 MG tablet Take 3-4 at bed time  . ibuprofen (ADVIL,MOTRIN) 800 MG tablet Take 800 mg by mouth every 8 (eight) hours as needed. for pain  . levothyroxine (SYNTHROID, LEVOTHROID) 150 MCG tablet   . lithium 300 MG tablet Take 4 tablets (1,200 mg total) by mouth at bedtime.  [name] trifluoperazine (STELAZINE) 2 MG tablet Take 1 tablet (2 mg total) by mouth at bedtime.  . [DISCONTINUED] ALPRAZolam (XANAX) 0.5 MG tablet Take 1 tablet (0.5 mg total) by mouth at bedtime as needed for anxiety.  . [DISCONTINUED] FLUoxetine (PROZAC) 10 MG capsule Take 3 capsule daily  . [DISCONTINUED] hydrOXYzine (ATARAX/VISTARIL) 25 MG tablet Take 3-4 at bed time  . [DISCONTINUED] lithium 300 MG tablet Take 4 tablets (1,200 mg total) by mouth at bedtime.  . [DISCONTINUED] trifluoperazine (STELAZINE) 2 MG tablet Take 1 tablet (2 mg total) by mouth at bedtime.   No facility-administered encounter medications on file as of 08/26/2015.    Recent Results (from the past 2160 hour(s))  Lithium level     Status: None   Collection Time: 06/10/15 11:56 AM  Result Value Ref Range   Lithium Lvl 0.80 0.80 - 1.40 mEq/L    Comment:   Footnotes:  (1) ** Please note change in unit of measure and reference range(s). **     Hemoglobin A1c     Status: None   Collection Time: 06/10/15 11:56 AM  Result Value Ref Range   Hgb A1c MFr Bld 5.4 <5.7 %    Comment:                                                                        According to the ADA Clinical Practice Recommendations for 2011, when HbA1c is used as a screening test:     >=6.5%   Diagnostic of Diabetes Mellitus            (if abnormal result is confirmed)   5.7-6.4%   Increased risk of developing Diabetes Mellitus   References:Diagnosis and Classification of Diabetes Mellitus,Diabetes WOEH,2122,48(GNOIB 1):S62-S69 and Standards of Medical  Care in         Diabetes - 2011,Diabetes Care,2011,34 (Suppl 1):S11-S61.      Mean Plasma Glucose 108 <117 mg/dL  COMPLETE METABOLIC PANEL WITH GFR     Status: None   Collection Time: 06/10/15 11:56 AM  Result Value Ref Range   Sodium 138 135 - 146 mmol/L   Potassium 4.6 3.5 - 5.3 mmol/L   Chloride 109 98 - 110 mmol/L   CO2 22 20 - 31 mmol/L   Glucose, Bld 76 65 - 99 mg/dL   BUN 19 7 - 25 mg/dL   Creat 1.04 0.50 - 1.05 mg/dL   Total Bilirubin 0.4 0.2 - 1.2 mg/dL   Alkaline Phosphatase 79 33 - 130 U/L   AST 20 10 - 35 U/L   ALT 20 6 -  29 U/L   Total Protein 6.2 6.1 - 8.1 g/dL   Albumin 4.0 3.6 - 5.1 g/dL   Calcium 10.1 8.6 - 10.4 mg/dL   GFR, Est African American 70 >=60 mL/min   GFR, Est Non African American 61 >=60 mL/min    Comment:   The estimated GFR is a calculation valid for adults (>=33 years old) that uses the CKD-EPI algorithm to adjust for age and sex. It is   not to be used for children, pregnant women, hospitalized patients,    patients on dialysis, or with rapidly changing kidney function. According to the NKDEP, eGFR >89 is normal, 60-89 shows mild impairment, 30-59 shows moderate impairment, 15-29 shows severe impairment and <15 is ESRD.         Constitutional:  BP 131/78 mmHg  Pulse 98  Ht _0  (1.753 m)  Wt 240 lb (108.863 kg)  BMI 35.43 kg/m2   Musculoskeletal: Strength & Muscle Tone: within normal limits Gait & Station: normal Patient leans: N/A  Psychiatric Specialty Exam: General Appearance: Casual  Eye Contact::  Fair  Speech:  Slow  Volume:  Decreased  Mood:  Anxious  Affect:  Congruent  Thought Process:  Coherent  Orientation:  Full (Time, Place, and Person)  Thought Content:  WDL  Suicidal Thoughts:  Passive and fleeting thoughts but no plan or intent  Homicidal Thoughts:  No  Memory:  Immediate;   Fair Recent;   Fair Remote;   Fair  Judgement:  Fair  Insight:  Good  Psychomotor Activity:  Normal  Concentration:  Good   Recall:  Good  Fund of Knowledge:  Good  Language:  Good  Akathisia:  No  Handed:  Right  AIMS (if indicated):     Assets:  Communication Skills Desire for Improvement Financial Resources/Insurance Housing Physical Health Social Support  ADL's:  Intact  Cognition:  WNL  Sleep:        Established Problem, Stable/Improving (1), Review of Psycho-Social Stressors (1), Review and summation of old records (2), Established Problem, Worsening (2), Review of Last Therapy Session (1), Review of Medication Regimen & Side Effects (2) and Review of New Medication or Change in Dosage (2)  Assessment: Axis I: Bipolar disorder, mixed.  Anxiety disorder NOS.  Axis II: Deferred  Axis III:  Past Medical History  Diagnosis Date  . Thyroid disease      Plan:  Patient is experiencing more depression and decided to have passive and fleeting suicidal thoughts.  She denies any plan or any intent and contracts for safety.  She wants to try a different antidepressant.  I reviewed her past history in the past she had tried Cymbalta, Zoloft and Wellbutrin with limited response.  She had never tried Lexapro I recommended to try Lexapro 10 mg .  She tried to come off from Xanax but could not sleep and having hallucination and I suggested not to stop Xanax and continue Xanax and Vistaril combination which is helping her sleep.  Patient agree with the plan.  She will also continue Stelazine 2 mg at bedtime.  I would discontinue Prozac.  Discuss safety plan that anytime having active suicidal thoughts or homicidal thought and she need to call 911 or go to the local emergency room.  I will see her again in 4-6 weeks.    Zacchaeus Halm T., MD 08/26/2015

## 2015-09-04 ENCOUNTER — Ambulatory Visit: Payer: Self-pay

## 2015-09-07 ENCOUNTER — Ambulatory Visit
Admission: RE | Admit: 2015-09-07 | Discharge: 2015-09-07 | Disposition: A | Discharge status: Home / Self Care | Payer: 59 | Source: Ambulatory Visit

## 2015-09-07 DIAGNOSIS — Z1231 Encounter for screening mammogram for malignant neoplasm of breast: Secondary | ICD-10-CM

## 2015-10-07 ENCOUNTER — Encounter (HOSPITAL_COMMUNITY): Payer: Self-pay | Admitting: Psychiatry

## 2015-10-07 ENCOUNTER — Ambulatory Visit (INDEPENDENT_AMBULATORY_CARE_PROVIDER_SITE_OTHER): Payer: 59 | Admitting: Psychiatry

## 2015-10-07 VITALS — BP 127/86 | HR 74 | Ht 69.0 in | Wt 242.2 lb

## 2015-10-07 DIAGNOSIS — F3161 Bipolar disorder, current episode mixed, mild: Secondary | ICD-10-CM | POA: Diagnosis not present

## 2015-10-07 DIAGNOSIS — F419 Anxiety disorder, unspecified: Secondary | ICD-10-CM | POA: Diagnosis not present

## 2015-10-07 MED ORDER — ESCITALOPRAM OXALATE 20 MG PO TABS: 20.0000 mg | ORAL_TABLET | Freq: Every day | ORAL | Status: DC

## 2015-10-07 MED ORDER — TRIFLUOPERAZINE HCL 2 MG PO TABS: 2.0000 mg | ORAL_TABLET | Freq: Every day | ORAL | Status: DC

## 2015-10-07 MED ORDER — LITHIUM CARBONATE 300 MG PO TABS: 1200.0000 mg | ORAL_TABLET | Freq: Every day | ORAL | Status: DC

## 2015-10-07 MED ORDER — HYDROXYZINE HCL 25 MG PO TABS: ORAL_TABLET | ORAL | Status: DC

## 2015-10-07 NOTE — Progress Notes (Signed)
[place] [phone] Progress Note  Miranda Harris [id number] 55 y.o.  10/07/2015 4:09 PM  Chief Complaint:  I like Lexapro.  I'm feeling better.  I'm taking 20 mg and I have no side effects.     History of Present Illness:  Miranda Harris came for her follow-up appointment.  On her last visit we started her on Lexapro 10 mg and she is tolerating well.  Recently she increased 20 mg and she has seen a huge improvement in her depression and anxiety symptoms.  She is more relaxed and calm.  She denies any irritability, paranoia, crying spells or any feeling of hopelessness or worthlessness.  She sleeping very well with Vistaril 150 mg and Xanax 0.5 mg at bedtime.  She has no tremors or shakes.  She had a good Christmas.  She was disappointed because her children did not come for Christmas but she had a very good time with her husband.  Since taking Lexapro she has no longer having any suicidal thoughts.  Her energy level is good.  Her attention and concentration is also improved.  She is more social and active.  Patient denies drinking or using any illegal substances.  Her appetite is okay.  Her vitals are stable.  Patient denies drinking or using any illegal substances.  She is taking lithium 1200 mg at bedtime, Stelazine 2 mg at bedtime, Lexapro 20 mg daily and Vistaril one 50 mg at bedtime.   Suicidal Ideation: No Plan Formed: No Patient has means to carry out plan: No  Homicidal Ideation: No Plan Formed: No Patient has means to carry out plan: No  Past Psychiatric History/Hospitalization(s): Patient has hospitalization at age 67 due to mania and psychosis and depression.  She was hyperverbal, extreme mood swing, sexually promiscuous, paranoia, hallucination and having suicidal thoughts.  She has another episode at age 14 due to noncompliance with medication.  Since then she is taking her medication most of the time.  In the past she had tried Abilify which causes flulike symptoms, Lunesta causes  sedation and tired feeling, Ambien which causes sleepwalking, driving and eating and the night, Haldol and Thorazine which causes EPS and shakes and trazodone which cause excessive sedation.[name]  She's been taking Stelazine and lithium for more than 25 years and she reported no side effects.  She had tried Cymbalta, Zoloft and Wellbutrin but limited response.  She was seeing Dr. Harriette Harris in [place] and [facility].  She was getting Xanax in [place] for insomnia and she was taking 2 mg until recently she has been trying to cut down the dose. Anxiety: Yes Bipolar Disorder: Yes Depression: Yes Mania: Yes Psychosis: Yes Schizophrenia: No Personality Disorder: No Hospitalization for psychiatric illness: Yes History of Electroconvulsive Shock Therapy: No Prior Suicide Attempts: No  Medical History; Patient has hypothyroidism, bladder problem and chronic pain.  She see Miranda Harris for her primary care needs.  Family History; Patient endorse mother has bipolar-like symptoms but never diagnosed.  Substance Abuse History; Patient endorse distant history of drinking alcohol heavily however claims to be sober for many years.  She denies any withdrawal symptoms including tremors, shakes or any seizures.  Review of Systems  Constitutional: Negative.   Cardiovascular: Negative for chest pain.  Musculoskeletal: Negative.   Skin: Negative for itching and rash.  Neurological: Negative for dizziness, tremors and headaches.  Psychiatric/Behavioral: Positive for depression. The patient has insomnia.     Psychiatric: Agitation: No Hallucination: No Depressed Mood: No Insomnia: No Hypersomnia: No  Altered Concentration: No Feels Worthless: No Grandiose Ideas: No Belief In Special Powers: No New/Increased Substance Abuse: No Compulsions: No  Neurologic: Headache: No Seizure: No Paresthesias: No   Outpatient Encounter Prescriptions as of 10/07/2015  Medication Sig  .  amoxicillin (AMOXIL) 875 MG tablet Take 875 mg by mouth.  . hydrocortisone (ANUSOL-HC) 25 MG suppository Place 25 mg rectally.  . ALPRAZolam (XANAX) 0.5 MG tablet Take 1 tablet (0.5 mg total) by mouth at bedtime as needed for anxiety.  [name] escitalopram (LEXAPRO) 20 MG tablet Take 1 tablet (20 mg total) by mouth daily.  . hydrOXYzine (ATARAX/VISTARIL) 25 MG tablet Take 3 at bed time  . ibuprofen (ADVIL,MOTRIN) 800 MG tablet Take 800 mg by mouth every 8 (eight) hours as needed. for pain  . levothyroxine (SYNTHROID, LEVOTHROID) 150 MCG tablet   . lithium 300 MG tablet Take 4 tablets (1,200 mg total) by mouth at bedtime.  [name] trifluoperazine (STELAZINE) 2 MG tablet Take 1 tablet (2 mg total) by mouth at bedtime.  . [DISCONTINUED] escitalopram (LEXAPRO) 10 MG tablet Take 1 tablet (10 mg total) by mouth daily.  . [DISCONTINUED] hydrOXYzine (ATARAX/VISTARIL) 25 MG tablet Take 3-4 at bed time  . [DISCONTINUED] lithium 300 MG tablet Take 4 tablets (1,200 mg total) by mouth at bedtime.  . [DISCONTINUED] trifluoperazine (STELAZINE) 2 MG tablet Take 1 tablet (2 mg total) by mouth at bedtime.   No facility-administered encounter medications on file as of 10/07/2015.    No results found for this or any previous visit (from the past 2160 hour(s)).    Constitutional:  BP 127/86 mmHg  Pulse 74  Ht 5\' 9"  (1.753 m)  Wt 242 lb 3.2 oz (109.861 kg)  BMI 35.75 kg/m2   Musculoskeletal: Strength & Muscle Tone: within normal limits Gait & Station: normal Patient leans: N/A  Psychiatric Specialty Exam: General Appearance: Casual  Eye Contact::  Fair  Speech:  Slow  Volume:  Normal  Mood:  Anxious  Affect:  Congruent  Thought Process:  Coherent  Orientation:  Full (Time, Place, and Person)  Thought Content:  WDL  Suicidal Thoughts:  No  Homicidal Thoughts:  No  Memory:  Immediate;   Fair Recent;   Fair Remote;   Fair  Judgement:  Fair  Insight:  Good  Psychomotor Activity:  Normal  Concentration:   Good  Recall:  Good  Fund of Knowledge:  Good  Language:  Good  Akathisia:  No  Handed:  Right  AIMS (if indicated):     Assets:  Communication Skills Desire for Improvement Financial Resources/Insurance Housing Physical Health Social Support  ADL's:  Intact  Cognition:  WNL  Sleep:        Established Problem, Stable/Improving (1), Review of Last Therapy Session (1) and Review of Medication Regimen & Side Effects (2)  Assessment: Axis I: Bipolar disorder, mixed.  Anxiety disorder NOS.  Axis II: Deferred  Axis III:  Past Medical History  Diagnosis Date  . Thyroid disease      Plan:  Patient is doing better on her current medication.  Continue Lexapro 20 mg daily , Xanax 0.5 mg at bedtime, Stelazine 2 mg at bedtime , lithium 1200 mg a day and Vistaril one 50 mg at bedtime.  We will do lithium level on her next appointment.  Discussed medication side effects and benefits.  Recommended to call us back if she has any question or any concern.  Follow-up in 3 months.  Discuss safety plan that anytime having active  suicidal thoughts or homicidal thought and she need to call 911 or go to the local emergency room.   Armanii Pressnell T., MD 10/07/2015

## 2015-10-12 ENCOUNTER — Other Ambulatory Visit (HOSPITAL_COMMUNITY): Payer: Self-pay

## 2015-10-12 DIAGNOSIS — F3161 Bipolar disorder, current episode mixed, mild: Secondary | ICD-10-CM

## 2015-10-12 MED ORDER — ALPRAZOLAM 0.5 MG PO TABS: 0.5000 mg | ORAL_TABLET | Freq: Every evening | ORAL | Status: DC | PRN

## 2015-10-12 NOTE — Telephone Encounter (Signed)
Patient called this morning, she forgot to get a refill of her xanax when she was here on 10/07/15. Will it be okay to refill this medication? Please advise, thank you.

## 2015-10-12 NOTE — Telephone Encounter (Signed)
Refilled xanax, called into [facility]. I accidentally printed the rx - that was voided out and called in. I called the patient as well and let her know that it was okay to fill

## 2015-11-06 ENCOUNTER — Other Ambulatory Visit (HOSPITAL_COMMUNITY): Payer: Self-pay

## 2015-11-06 DIAGNOSIS — F3161 Bipolar disorder, current episode mixed, mild: Secondary | ICD-10-CM

## 2015-11-06 MED ORDER — ALPRAZOLAM 0.5 MG PO TABS: 0.5000 mg | ORAL_TABLET | Freq: Every evening | ORAL | Status: DC | PRN

## 2015-11-06 NOTE — Telephone Encounter (Signed)
Patient is calling for a refill on alprazolam, last visit 1/11 and her f/u is 4/11. She got a prescription of 30 on 1/16 no refills. Is it okay to refill? Please advise, thank you

## 2015-11-06 NOTE — Telephone Encounter (Signed)
I s/w Dr. Adele Schilder and he gave the verbal okay to refill the patients alprazolam. [facility] [address] and called patient to let her know that this was done.

## 2015-11-09 ENCOUNTER — Telehealth (HOSPITAL_COMMUNITY): Payer: Self-pay

## 2015-11-09 DIAGNOSIS — F3161 Bipolar disorder, current episode mixed, mild: Secondary | ICD-10-CM

## 2015-11-09 MED ORDER — ESCITALOPRAM OXALATE 20 MG PO TABS: 20.0000 mg | ORAL_TABLET | Freq: Every day | ORAL | Status: DC

## 2015-11-09 NOTE — Telephone Encounter (Signed)
Medication management - Fax received from [place] on [facility] to change patient's Lexapro to a 90 day prescription.  Met with Dr. Adele Schilder who approved change and new 90 day order e-scribed to pt's [facility] as requested.

## 2015-12-10 ENCOUNTER — Other Ambulatory Visit (HOSPITAL_COMMUNITY): Payer: Self-pay

## 2015-12-10 DIAGNOSIS — F3161 Bipolar disorder, current episode mixed, mild: Secondary | ICD-10-CM

## 2015-12-10 NOTE — Telephone Encounter (Signed)
Patient has an appointment coming up but is running out of Xanax, she would like a refill. Please review and advise, thank you

## 2015-12-11 MED ORDER — ALPRAZOLAM 0.5 MG PO TABS: 0.5000 mg | ORAL_TABLET | Freq: Every evening | ORAL | Status: DC | PRN

## 2015-12-11 NOTE — Telephone Encounter (Signed)
Per Dr. Adele Schilder [facility] and called the patient to let her know that this had been done

## 2016-01-05 ENCOUNTER — Encounter (HOSPITAL_COMMUNITY): Payer: Self-pay | Admitting: Psychiatry

## 2016-01-05 ENCOUNTER — Ambulatory Visit (INDEPENDENT_AMBULATORY_CARE_PROVIDER_SITE_OTHER): Payer: 59 | Admitting: Psychiatry

## 2016-01-05 VITALS — BP 120/76 | HR 78 | Ht 69.0 in | Wt 245.6 lb

## 2016-01-05 DIAGNOSIS — F419 Anxiety disorder, unspecified: Secondary | ICD-10-CM | POA: Diagnosis not present

## 2016-01-05 DIAGNOSIS — F3161 Bipolar disorder, current episode mixed, mild: Secondary | ICD-10-CM

## 2016-01-05 MED ORDER — ESCITALOPRAM OXALATE 20 MG PO TABS
20.0000 mg | ORAL_TABLET | Freq: Every day | ORAL | Status: DC
Start: 2016-01-05 — End: 2016-03-28

## 2016-01-05 MED ORDER — HYDROXYZINE HCL 25 MG PO TABS: ORAL_TABLET | ORAL | Status: DC

## 2016-01-05 MED ORDER — ALPRAZOLAM 0.5 MG PO TABS: 0.5000 mg | ORAL_TABLET | Freq: Every evening | ORAL | Status: DC | PRN

## 2016-01-05 MED ORDER — TRIFLUOPERAZINE HCL 2 MG PO TABS: 2.0000 mg | ORAL_TABLET | Freq: Every day | ORAL | Status: DC

## 2016-01-05 MED ORDER — LITHIUM CARBONATE 600 MG PO CAPS: 600.0000 mg | ORAL_CAPSULE | Freq: Two times a day (BID) | ORAL | Status: DC

## 2016-01-05 NOTE — Progress Notes (Signed)
[place] [phone] Progress Note  Miranda Harris [id number] 55 y.o.  01/05/2016 3:23 PM  Chief Complaint:  I am doing better.  I like my medication.       History of Present Illness:  Miranda Harris came for her follow-up appointment.  She is compliant with her medication and reported no side effects.  She still have some time upset and down but they are less intense and less frequent.  She is taking Vistaril at bedtime.  Helping her sleep.  She denies any irritability, mania, psychosis, anger or any feeling of hopelessness or worthlessness.  She has no tremors or shakes.  She denies any crying spells.  She has noticed much improvement with the addition of Lexapro in recent months.  Patient denies drinking or using any illegal substances.  She continues to take Xanax 0.5 mg at bedtime along with other psychiatric medication.  Her attention concentration is good.  She had good energy level.  Her appetite is okay.  Her vitals are stable.  Patient denies drinking or using any illegal substances.  She lives with her husband.  Suicidal Ideation: No Plan Formed: No Patient has means to carry out plan: No  Homicidal Ideation: No Plan Formed: No Patient has means to carry out plan: No  Past Psychiatric History/Hospitalization(s): Patient has hospitalization at age 22 due to mania and psychosis and depression.  She was hyperverbal, extreme mood swing, sexually promiscuous, paranoia, hallucination and having suicidal thoughts.  She has another episode at age 63 due to noncompliance with medication.  Since then she is taking her medication most of the time.  In the past she had tried Abilify which causes flulike symptoms, Lunesta causes sedation and tired feeling, Ambien which causes sleepwalking, driving and eating and the night, Haldol and Thorazine which causes EPS and shakes and trazodone which cause excessive sedation.[name]  She's been taking Stelazine and lithium for more than 25 years and she reported no  side effects.  She had tried Cymbalta, Zoloft and Wellbutrin but limited response.  She was seeing Dr. Harriette Bouillon in [place] and [facility].  She was getting Xanax in [place] for insomnia and she was taking 2 mg until recently she has been trying to cut down the dose. Anxiety: Yes Bipolar Disorder: Yes Depression: Yes Mania: Yes Psychosis: Yes Schizophrenia: No Personality Disorder: No Hospitalization for psychiatric illness: Yes History of Electroconvulsive Shock Therapy: No Prior Suicide Attempts: No  Medical History; Patient has hypothyroidism, bladder problem and chronic pain.  She see [name] for her primary care needs.  Family History; Patient endorse mother has bipolar-like symptoms but never diagnosed.  Substance Abuse History; Patient endorse distant history of drinking alcohol heavily however claims to be sober for many years.  She denies any withdrawal symptoms including tremors, shakes or any seizures.  Review of Systems  Constitutional: Negative.   Cardiovascular: Negative for chest pain.  Musculoskeletal: Negative.   Skin: Negative for itching and rash.  Neurological: Negative for dizziness, tremors and headaches.    Psychiatric: Agitation: No Hallucination: No Depressed Mood: No Insomnia: No Hypersomnia: No Altered Concentration: No Feels Worthless: No Grandiose Ideas: No Belief In Special Powers: No New/Increased Substance Abuse: No Compulsions: No  Neurologic: Headache: No Seizure: No Paresthesias: No   Outpatient Encounter Prescriptions as of 01/05/2016  Medication Sig  . ALPRAZolam (XANAX) 0.5 MG tablet Take 1 tablet (0.5 mg total) by mouth at bedtime as needed for anxiety.  [name] escitalopram (LEXAPRO) 20 MG tablet Take 1  tablet (20 mg total) by mouth daily.  . hydrocortisone (ANUSOL-HC) 25 MG suppository Place 25 mg rectally.  . hydrOXYzine (ATARAX/VISTARIL) 25 MG tablet Take 3 at bed time  . ibuprofen (ADVIL,MOTRIN) 800  MG tablet Take 800 mg by mouth every 8 (eight) hours as needed. for pain  . levothyroxine (SYNTHROID, LEVOTHROID) 150 MCG tablet   . lithium carbonate 600 MG capsule Take 1 capsule (600 mg total) by mouth 2 (two) times daily with a meal.  . trifluoperazine (STELAZINE) 2 MG tablet Take 1 tablet (2 mg total) by mouth at bedtime.  . [DISCONTINUED] ALPRAZolam (XANAX) 0.5 MG tablet Take 1 tablet (0.5 mg total) by mouth at bedtime as needed for anxiety.  . [DISCONTINUED] escitalopram (LEXAPRO) 20 MG tablet Take 1 tablet (20 mg total) by mouth daily.  . [DISCONTINUED] hydrOXYzine (ATARAX/VISTARIL) 25 MG tablet Take 3 at bed time  . [DISCONTINUED] lithium 300 MG tablet Take 4 tablets (1,200 mg total) by mouth at bedtime.  . [DISCONTINUED] trifluoperazine (STELAZINE) 2 MG tablet Take 1 tablet (2 mg total) by mouth at bedtime.   No facility-administered encounter medications on file as of 01/05/2016.    No results found for this or any previous visit (from the past 2160 hour(s)).    Constitutional:  BP 120/76 mmHg  Pulse 78  Ht 5\' 9"  (1.753 m)  Wt 245 lb 9.6 oz (111.403 kg)  BMI 36.25 kg/m2   Musculoskeletal: Strength & Muscle Tone: within normal limits Gait & Station: normal Patient leans: N/A  Psychiatric Specialty Exam: General Appearance: Casual  Eye Contact::  Fair  Speech:  Slow  Volume:  Normal  Mood:  Euthymic  Affect:  Congruent  Thought Process:  Coherent  Orientation:  Full (Time, Place, and Person)  Thought Content:  WDL  Suicidal Thoughts:  No  Homicidal Thoughts:  No  Memory:  Immediate;   Fair Recent;   Fair Remote;   Fair  Judgement:  Fair  Insight:  Good  Psychomotor Activity:  Normal  Concentration:  Good  Recall:  Good  Fund of Knowledge:  Good  Language:  Good  Akathisia:  No  Handed:  Right  AIMS (if indicated):     Assets:  Communication Skills Desire for Improvement Financial Resources/Insurance Housing Physical Health Social Support  ADL's:   Intact  Cognition:  WNL  Sleep:        Established Problem, Stable/Improving (1), Review of Last Therapy Session (1) and Review of Medication Regimen & Side Effects (2)  Assessment: Axis I: Bipolar disorder, mixed.  Anxiety disorder NOS.  Axis II: Deferred  Axis III:  Past Medical History  Diagnosis Date  . Thyroid disease      Plan:  Patient is Stable on her current psychiatric medication.  I will continue Lexapro 20 mg daily , Xanax 0.5 mg at bedtime, Stelazine 2 mg at bedtime , lithium 1200 mg a day and Vistaril 150 mg at bedtime.  We will do lithium level on her next appointment.  Discussed medication side effects and benefits.  Recommended to call us back if she has any question or any concern.  Follow-up in 3 months.  Discuss safety plan that anytime having active suicidal thoughts or homicidal thought and she need to call 911 or go to the local emergency room.   Kambryn Dapolito T., MD 01/05/2016

## 2016-01-06 ENCOUNTER — Telehealth (HOSPITAL_COMMUNITY): Payer: Self-pay

## 2016-01-06 ENCOUNTER — Other Ambulatory Visit (HOSPITAL_COMMUNITY): Payer: Self-pay | Admitting: Psychiatry

## 2016-01-06 DIAGNOSIS — F3161 Bipolar disorder, current episode mixed, mild: Secondary | ICD-10-CM

## 2016-01-06 HISTORY — PX: COLONOSCOPY: SHX174

## 2016-01-06 MED ORDER — LITHIUM CARBONATE 300 MG PO TABS: 300.0000 mg | ORAL_TABLET | Freq: Four times a day (QID) | ORAL | Status: DC

## 2016-01-06 NOTE — Telephone Encounter (Signed)
Ok to refill 300 mg

## 2016-01-06 NOTE — Telephone Encounter (Signed)
Patient is calling, she states that her pharmacy does not have Lithium 600 mg in stock and would like to know if the prescription can be changed from 600 mg bid to 300 mg 4 times a day. Please review and advise, thank you

## 2016-01-06 NOTE — Telephone Encounter (Signed)
Refilled for 300 mg 4 times a day, I called patient and left voicemail letting her know

## 2016-01-07 ENCOUNTER — Other Ambulatory Visit (HOSPITAL_COMMUNITY): Payer: Self-pay | Admitting: Psychiatry

## 2016-01-07 NOTE — Telephone Encounter (Signed)
Already given 90 days supply

## 2016-03-23 ENCOUNTER — Telehealth (HOSPITAL_COMMUNITY): Payer: Self-pay

## 2016-03-23 DIAGNOSIS — F3161 Bipolar disorder, current episode mixed, mild: Secondary | ICD-10-CM

## 2016-03-23 NOTE — Telephone Encounter (Signed)
Patient is calling, she had to reschedule her appointment for 8/3 and will run out of her medications at the beginning of July. Patient asking for a one month refill to last until her follow up. Please review and advise, thank you

## 2016-03-28 ENCOUNTER — Other Ambulatory Visit (HOSPITAL_COMMUNITY): Payer: Self-pay | Admitting: Psychiatry

## 2016-03-28 MED ORDER — HYDROXYZINE HCL 25 MG PO TABS: ORAL_TABLET | ORAL | Status: DC

## 2016-03-28 MED ORDER — ESCITALOPRAM OXALATE 20 MG PO TABS: 20.0000 mg | ORAL_TABLET | Freq: Every day | ORAL | Status: DC

## 2016-03-28 MED ORDER — TRIFLUOPERAZINE HCL 2 MG PO TABS: 2.0000 mg | ORAL_TABLET | Freq: Every day | ORAL | Status: DC

## 2016-03-28 MED ORDER — ALPRAZOLAM 0.5 MG PO TABS: 0.5000 mg | ORAL_TABLET | Freq: Every evening | ORAL | Status: DC | PRN

## 2016-03-28 MED ORDER — LITHIUM CARBONATE 300 MG PO TABS: 300.0000 mg | ORAL_TABLET | Freq: Four times a day (QID) | ORAL | Status: DC

## 2016-03-28 NOTE — Telephone Encounter (Signed)
Okay to refill? 

## 2016-03-28 NOTE — Telephone Encounter (Signed)
Sent in a one month order for all of patents medications. Called patient and let her know

## 2016-04-06 ENCOUNTER — Other Ambulatory Visit (HOSPITAL_COMMUNITY): Payer: Self-pay | Admitting: Psychiatry

## 2016-04-12 ENCOUNTER — Ambulatory Visit (HOSPITAL_COMMUNITY): Payer: Self-pay | Admitting: Psychiatry

## 2016-04-28 ENCOUNTER — Ambulatory Visit (HOSPITAL_COMMUNITY): Payer: Self-pay | Admitting: Psychiatry

## 2016-05-02 ENCOUNTER — Other Ambulatory Visit (HOSPITAL_COMMUNITY): Payer: Self-pay | Admitting: Psychiatry

## 2016-05-02 ENCOUNTER — Other Ambulatory Visit (HOSPITAL_COMMUNITY): Payer: Self-pay

## 2016-05-02 DIAGNOSIS — F3161 Bipolar disorder, current episode mixed, mild: Secondary | ICD-10-CM

## 2016-05-02 MED ORDER — ALPRAZOLAM 0.5 MG PO TABS: 0.5000 mg | ORAL_TABLET | Freq: Every evening | ORAL | 0 refills | Status: DC | PRN

## 2016-05-03 ENCOUNTER — Other Ambulatory Visit (HOSPITAL_COMMUNITY): Payer: Self-pay | Admitting: Psychiatry

## 2016-05-03 DIAGNOSIS — F3161 Bipolar disorder, current episode mixed, mild: Secondary | ICD-10-CM

## 2016-05-04 DIAGNOSIS — M7061 Trochanteric bursitis, right hip: Secondary | ICD-10-CM | POA: Insufficient documentation

## 2016-05-06 ENCOUNTER — Other Ambulatory Visit (HOSPITAL_COMMUNITY): Payer: Self-pay | Admitting: Psychiatry

## 2016-05-06 DIAGNOSIS — F3161 Bipolar disorder, current episode mixed, mild: Secondary | ICD-10-CM

## 2016-05-09 ENCOUNTER — Other Ambulatory Visit (HOSPITAL_COMMUNITY): Payer: Self-pay

## 2016-05-09 DIAGNOSIS — F3161 Bipolar disorder, current episode mixed, mild: Secondary | ICD-10-CM

## 2016-05-09 MED ORDER — LITHIUM CARBONATE 300 MG PO TABS: 300.0000 mg | ORAL_TABLET | Freq: Four times a day (QID) | ORAL | 0 refills | Status: DC

## 2016-05-31 ENCOUNTER — Other Ambulatory Visit (HOSPITAL_COMMUNITY): Payer: Self-pay | Admitting: Psychiatry

## 2016-05-31 DIAGNOSIS — F3161 Bipolar disorder, current episode mixed, mild: Secondary | ICD-10-CM

## 2016-06-06 ENCOUNTER — Other Ambulatory Visit (HOSPITAL_COMMUNITY): Payer: Self-pay | Admitting: Psychiatry

## 2016-06-06 ENCOUNTER — Other Ambulatory Visit (HOSPITAL_COMMUNITY): Payer: Self-pay

## 2016-06-06 DIAGNOSIS — F3161 Bipolar disorder, current episode mixed, mild: Secondary | ICD-10-CM

## 2016-06-06 MED ORDER — ESCITALOPRAM OXALATE 20 MG PO TABS: 20.0000 mg | ORAL_TABLET | Freq: Every day | ORAL | 0 refills | Status: DC

## 2016-06-06 MED ORDER — HYDROXYZINE HCL 25 MG PO TABS: 75.0000 mg | ORAL_TABLET | Freq: Every day | ORAL | 1 refills | Status: DC

## 2016-06-06 MED ORDER — ALPRAZOLAM 0.5 MG PO TABS: 0.5000 mg | ORAL_TABLET | Freq: Every evening | ORAL | 1 refills | Status: DC | PRN

## 2016-06-06 MED ORDER — ESCITALOPRAM OXALATE 20 MG PO TABS: 20.0000 mg | ORAL_TABLET | Freq: Every day | ORAL | 1 refills | Status: DC

## 2016-06-06 MED ORDER — LITHIUM CARBONATE 300 MG PO TABS: 300.0000 mg | ORAL_TABLET | Freq: Four times a day (QID) | ORAL | 1 refills | Status: DC

## 2016-06-06 MED ORDER — ALPRAZOLAM 0.5 MG PO TABS: 0.5000 mg | ORAL_TABLET | Freq: Every evening | ORAL | 0 refills | Status: DC | PRN

## 2016-06-06 MED ORDER — LITHIUM CARBONATE 300 MG PO TABS: 300.0000 mg | ORAL_TABLET | Freq: Four times a day (QID) | ORAL | 0 refills | Status: DC

## 2016-06-06 NOTE — Progress Notes (Signed)
Patient called for a refill, she had to reschedule due to provider not being in office. Refill is appropriate and patient has a follow up in Nov. Per protocol I sent a one month order to the pharmacy with one refill to get patient to her appointment.

## 2016-06-08 ENCOUNTER — Ambulatory Visit (HOSPITAL_COMMUNITY): Payer: Self-pay | Admitting: Psychiatry

## 2016-06-21 NOTE — Progress Notes (Addendum)
[place] Progress Note  Miranda Harris [id number] 55 y.o.  06/21/2016 12:00 PM  Chief Complaint:  "I feel extremely depressed."  History of Present Illness:  Miranda Harris came for her follow-up appointment.    Patient states that she feels extremely depressed, and does not feel that antidepressant is working. She was fired from her job 3 weeks ago due to difficulty concentration. She hopes to have some medication as she we will start her job next Monday. She tends to be very "negative" and has passive SI with occasional plan to shoot her or cut her wrist, although she adamantly denies any intent, stating that she has family. She discontinued Stelazine, as she does not see any benefit from this medication. (She used to take it for 10 years).  She has insomnia. She denies decreased need for sleep. She denies AH/VH. She denies any previous suicide attempt. She denies alcohol use or drug use. She hopes to wean off her Xanax given her history of substance use in the past. (currently takes 0.25 mg )  Suicidal Ideation: Yes Plan Formed: Yes Patient has means to carry out plan: No  Homicidal Ideation: No Plan Formed: No Patient has means to carry out plan: No  Past Psychiatric History/Hospitalization(s): Per Dr. Marguerite Olea note which verified with patient "Patient has hospitalization at age 76 due to mania and psychosis and depression.  She was hyperverbal, extreme mood swing, sexually promiscuous, paranoia, hallucination and having suicidal thoughts.  She has another episode at age 56 due to noncompliance with medication.  Since then she is taking her medication most of the time.  In the past she had tried Abilify which causes flulike symptoms, Lunesta causes sedation and tired feeling, Ambien which causes sleepwalking, driving and eating and the night, Haldol and Thorazine which causes EPS and shakes and trazodone which cause excessive sedation.[name]  She's been taking Stelazine and lithium for  more than 25 years and she reported no side effects.  She had tried Cymbalta, Zoloft and Wellbutrin but limited response.  She was seeing Dr. Harriette Bouillon in [place] and [facility]  She was getting Xanax in [place] for insomnia and she was taking 2 mg until recently she has been trying to cut down the dose." Anxiety: Yes Bipolar Disorder: Yes Depression: Yes Mania: Yes Psychosis: Yes Schizophrenia: No Personality Disorder: No Hospitalization for psychiatric illness: Yes History of Electroconvulsive Shock Therapy: No Prior Suicide Attempts: No  Medical History; Patient has hypothyroidism, bladder problem and chronic pain.  She see [name] for her primary care needs.  Active Ambulatory Problems    Diagnosis Date Noted  . Affective bipolar disorder ([place]) 06/25/2014  . Arthralgia of hip 06/25/2014  . Adult hypothyroidism 06/25/2014  . Bursitis, trochanteric 04/19/2015   Resolved Ambulatory Problems    Diagnosis Date Noted  . No Resolved Ambulatory Problems   Past Medical History:  Diagnosis Date  . Thyroid disease     Family History; Patient endorse mother has bipolar-like symptoms but never diagnosed.  Substance Abuse History; Patient endorse distant history of drinking alcohol heavily however claims to be sober for many years.  She denies any withdrawal symptoms including tremors, shakes or any seizures.  Review of Systems  Constitutional: Negative.   Cardiovascular: Negative for chest pain.  Musculoskeletal: Negative.   Skin: Negative for itching and rash.  Neurological: Negative for dizziness, tremors and headaches.  Psychiatric/Behavioral: Positive for depression and suicidal ideas. Negative for hallucinations and substance abuse. The patient is nervous/anxious and  has insomnia.     Psychiatric: Agitation: No Hallucination: No Depressed Mood: Yes Insomnia: No Hypersomnia: No Altered Concentration: No Feels Worthless: Yes Grandiose  Ideas: No Belief In Special Powers: No New/Increased Substance Abuse: No Compulsions: No  Neurologic: Headache: No Seizure: No Paresthesias: No   Outpatient Encounter Prescriptions as of 06/22/2016  Medication Sig  . ALPRAZolam (XANAX) 0.5 MG tablet Take 1 tablet (0.5 mg total) by mouth at bedtime as needed for anxiety.  [name] escitalopram (LEXAPRO) 20 MG tablet Take 1 tablet (20 mg total) by mouth daily.  . hydrocortisone (ANUSOL-HC) 25 MG suppository Place 25 mg rectally.  . hydrOXYzine (ATARAX/VISTARIL) 25 MG tablet Take 3 tablets (75 mg total) by mouth at bedtime.  [name] ibuprofen (ADVIL,MOTRIN) 800 MG tablet Take 800 mg by mouth every 8 (eight) hours as needed. for pain  . levothyroxine (SYNTHROID, LEVOTHROID) 150 MCG tablet   . lithium 300 MG tablet Take 1 tablet (300 mg total) by mouth 4 (four) times daily.  [name] trifluoperazine (STELAZINE) 2 MG tablet TAKE 1 TABLET (2 MG TOTAL) BY MOUTH AT BEDTIME.   No facility-administered encounter medications on file as of 06/22/2016.     No results found for this or any previous visit (from the past 2160 hour(s)).    Constitutional:  There were no vitals taken for this visit.   Musculoskeletal: Strength & Muscle Tone: within normal limits Gait & Station: normal Patient leans: N/A  Psychiatric Specialty Exam: General Appearance: Casual  Eye Contact::  Fair  Speech:  Normal Rate  Volume:  Normal  Mood:  Depressed  Affect:  down, slightly restricted  Thought Process:  Coherent  Orientation:  Full (Time, Place, and Person)  Thought Content:  Logical Perceptions: denies AH/Vh  Suicidal Thoughts:  Yes.  without intent/plan, occasionally has plan, but adamantly denies any intent  Homicidal Thoughts:  No  Memory:  Immediate;   Fair Recent;   Fair Remote;   Fair  Judgement:  Fair  Insight:  Good  Psychomotor Activity:  Normal  Concentration:  Good  Recall:  Good  Fund of Knowledge:  Good  Language:  Good  Akathisia:  No  Handed:   Right  AIMS (if indicated):     Assets:  Communication Skills Desire for Improvement Financial Resources/Insurance Housing Physical Health Social Support  ADL's:  Intact  Cognition:  WNL  Sleep:        Established Problem, Stable/Improving (1), Review of Last Therapy Session (1) and Review of Medication Regimen & Side Effects (2)  Assessment: Miranda Harris is a 55 year old female with bipolar disorder, anxiety, presented to the clinic for follow up appointment.   # Bipolar disorder Patient endorses worsening neurovegetative symptoms and anxiety since the last encounter. Patient reports preference to discontinue Lexapro; will switch to Effexor with uptitration. Will obtain labs to check lithium level and r/o other side effects. Noted that patient self discontinued Stelazine. Will not start antipsychotics at this time given lack of any manic/psychotic symptoms on today's evaluation. Will discontinue Xanax and start Trazodone prn for insomnia given concern for her history of substance use.   Plan:   1. Discontinue Lexapro 2. Start Venlafaxine 37.5 mg for two weeks, then 75 mg daily 3. Discontinue Xanax 4. Start Trazodone 25 mg at night as needed for sleep 5. Get blood test (Lithium, CBC, BMP, TSH) 6. Return to clinic in 3 months  The patient demonstrates the following  risk factors for suicide: Chronic risk factors for suicide include psychiatric  disorder /bipolar disorder, history of substance use disorder. Acute risk factors for suicide include none. Protective factors for this patient include positive social support, responsibility to others (family), coping skills, hope for the future. Although patient reports passive SI, she adamantly denies any intent and has had no previous suicide attempt. Considering these factors, the overall suicide risk at this point appears to be low. Discussed emergency resources including calling 911, suicide crisis line and coming to ED. Patient is advised  to call the clinic for earlier appointment if any worsening in her symptoms.  [name], MD 06/21/2016

## 2016-06-22 ENCOUNTER — Ambulatory Visit (INDEPENDENT_AMBULATORY_CARE_PROVIDER_SITE_OTHER): Payer: 59 | Admitting: Psychiatry

## 2016-06-22 ENCOUNTER — Encounter (HOSPITAL_COMMUNITY): Payer: Self-pay | Admitting: Psychiatry

## 2016-06-22 VITALS — BP 110/66 | HR 68 | Ht 69.0 in | Wt 226.2 lb

## 2016-06-22 DIAGNOSIS — F3161 Bipolar disorder, current episode mixed, mild: Secondary | ICD-10-CM

## 2016-06-22 DIAGNOSIS — F3130 Bipolar disorder, current episode depressed, mild or moderate severity, unspecified: Secondary | ICD-10-CM | POA: Insufficient documentation

## 2016-06-22 MED ORDER — HYDROXYZINE HCL 25 MG PO TABS: 75.0000 mg | ORAL_TABLET | Freq: Every day | ORAL | 2 refills | Status: DC

## 2016-06-22 MED ORDER — TRAZODONE HCL 50 MG PO TABS: 25.0000 mg | ORAL_TABLET | Freq: Every day | ORAL | 2 refills | Status: DC

## 2016-06-22 MED ORDER — VENLAFAXINE HCL ER 37.5 MG PO CP24: 37.5000 mg | ORAL_CAPSULE | Freq: Every day | ORAL | 2 refills | Status: DC

## 2016-06-22 NOTE — Patient Instructions (Addendum)
1. Discontinue Lexapro 2. Start Venlafaxine 37.5 mg for two weeks, then 75 mg daily 3. Discontinue Xanax 4. Start Trazodone 25 mg at night as needed for sleep 5. Get blood test (Lithium, CBC, BMP, TSH) 6. Return to clinic in 3 months

## 2016-06-29 ENCOUNTER — Telehealth (HOSPITAL_COMMUNITY): Payer: Self-pay

## 2016-06-29 MED ORDER — TRAZODONE HCL 50 MG PO TABS: 50.0000 mg | ORAL_TABLET | Freq: Every day | ORAL | 2 refills | Status: DC

## 2016-06-29 NOTE — Telephone Encounter (Signed)
Patient calls in if she can try 50 mg trazodone. Will order Trazodone 50 mg qhs prn for insomnia.

## 2016-06-29 NOTE — Telephone Encounter (Signed)
Patient is calling to see if she can take a whole trazodone at night, you currently have her on a half and she says it is not working and would like to increase. Please review and advise, thank you

## 2016-06-29 NOTE — Telephone Encounter (Signed)
Yes, she can take 50 mg. I ordered electrically so that she can take 50 mg 30 tabs for one month with refills. I appreciate if you could inform the patient.

## 2016-06-30 NOTE — Telephone Encounter (Signed)
I called patient and left voicemail letting her know that the Trazodone was at the pharmacy

## 2016-07-26 NOTE — Progress Notes (Signed)
[facility] [id number] Progress Note  Miranda Harris [id number] 55 y.o.  07/27/2016 8:57 AM  Chief Complaint:  "Not so good, I lost another job."  History of Present Illness:  Miranda Harris came for her follow-up appointment.  Patient states that she has not been feeling well since the last appointment. She feels exhausted during the day and could not focus at work. Although she started a new job at  [employer] since 10/2, she was laid off Last Friday. She has been trying to find a job and doing house chores. She denies taking a nap during the day. Her husband of 30 years as being very supportive for her. She reports the last time she felt back to herself was about 3 years ago when she worked for 7 years.   She feels depressed. She endorses insomnia. She states trazodone makes her very tired during the day. Although she endorses passive SI with occasional plan, she adamantly denies any intent stating that she had her husband, son and her family. She also states that she does not want her family to suffer as she did, when her father attempted suicide when she was 42.   Suicidal Ideation: Yes Plan Formed: Yes Patient has means to carry out plan: No  Homicidal Ideation: No Plan Formed: No Patient has means to carry out plan: No  Past Psychiatric History/Hospitalization(s): Per Dr. Marguerite Olea note which verified with patient "Patient has hospitalization at age 43 due to mania and psychosis and depression.  She was hyper verbal, extreme mood swing, sexually promiscuous, paranoia, hallucination and having suicidal thoughts.  She has another episode at age 27 due to noncompliance with medication.  Since then she is taking her medication most of the time.  In the past she had tried Abilify which causes flu like symptoms, Lunesta causes sedation and tired feeling, Ambien which causes sleepwalking, driving and eating and the night, Haldol and Thorazine which causes EPS and shakes and trazodone which  cause excessive sedation..  She used to be taking Stelazine and lithium for more than 25 years and she reported no side effects.  She had tried Cymbalta, Zoloft and Wellbutrin but limited response.  She was seeing Dr. Harriette Bouillon in [place] and [facility].  She was getting Xanax in [place] for insomnia and she was taking 2 mg until recently she has been trying to cut down the dose."  Anxiety: Yes Bipolar Disorder: Yes Depression: Yes Mania: Yes Psychosis: Yes Schizophrenia: No Personality Disorder: No Hospitalization for psychiatric illness: Yes History of Electroconvulsive Shock Therapy: No Prior Suicide Attempts: No  Medical History; Patient has hypothyroidism, bladder problem and chronic pain.  She see [name] for her primary care needs.  Active Ambulatory Problems    Diagnosis Date Noted  . Affective bipolar disorder ([place]) 06/25/2014  . Arthralgia of hip 06/25/2014  . Adult hypothyroidism 06/25/2014  . Bursitis, trochanteric 04/19/2015  . Bipolar I disorder, most recent episode depressed ([place]) 06/22/2016   Resolved Ambulatory Problems    Diagnosis Date Noted  . No Resolved Ambulatory Problems   Past Medical History:  Diagnosis Date  . Thyroid disease     Family History; Patient endorse mother has bipolar-like symptoms but never diagnosed. Her father attempted suicide.   Substance Abuse History; Patient endorse distant history of drinking alcohol heavily however claims to be sober for many years.  She denies any withdrawal symptoms including tremors, shakes or any seizures.  Review of Systems  Constitutional: Negative.   Cardiovascular: Negative  for chest pain.  Musculoskeletal: Negative.   Skin: Negative for itching and rash.  Neurological: Negative for dizziness, tremors and headaches.  Psychiatric/Behavioral: Positive for depression and suicidal ideas. Negative for hallucinations and substance abuse. The patient is nervous/anxious and has  insomnia.   All other systems reviewed and are negative.   Psychiatric: Agitation: No Hallucination: No Depressed Mood: Yes Insomnia: Yes Hypersomnia: No Altered Concentration: Yes Feels Worthless: Yes Grandiose Ideas: No Belief In Special Powers: No New/Increased Substance Abuse: No Compulsions: No  Neurologic: Headache: No Seizure: No Paresthesias: No   Outpatient Encounter Prescriptions as of 07/27/2016  Medication Sig  . hydrocortisone (ANUSOL-HC) 25 MG suppository Place 25 mg rectally.  [name] ibuprofen (ADVIL,MOTRIN) 800 MG tablet Take 800 mg by mouth every 8 (eight) hours as needed. for pain  . levothyroxine (SYNTHROID, LEVOTHROID) 150 MCG tablet   . lithium 300 MG tablet Take 1 tablet (300 mg total) by mouth 4 (four) times daily.  . QUEtiapine (SEROQUEL) 25 MG tablet Take 25-50 mg at night as needed for sleep  . venlafaxine XR (EFFEXOR-XR) 37.5 MG 24 hr capsule Take total of 112.5 mg (75+ 37.5 mg) for two weeks, then increase to 150 mg daily  . venlafaxine XR (EFFEXOR-XR) 75 MG 24 hr capsule Take 1 capsule (75 mg total) by mouth daily with breakfast. Take total of 112.5 mg (75+ 37.5 mg) for two weeks, then 150 mg daily  . [DISCONTINUED] ALPRAZolam (XANAX) 0.5 MG tablet Take 1 tablet (0.5 mg total) by mouth at bedtime as needed for anxiety.  . [DISCONTINUED] hydrOXYzine (ATARAX/VISTARIL) 25 MG tablet Take 3 tablets (75 mg total) by mouth at bedtime.  . [DISCONTINUED] traZODone (DESYREL) 50 MG tablet Take 1 tablet (50 mg total) by mouth at bedtime.  . [DISCONTINUED] trifluoperazine (STELAZINE) 2 MG tablet TAKE 1 TABLET (2 MG TOTAL) BY MOUTH AT BEDTIME.  . [DISCONTINUED] venlafaxine XR (EFFEXOR XR) 37.5 MG 24 hr capsule Take 1 capsule (37.5 mg total) by mouth daily with breakfast. 37.5 mg for two weeks, then increase to 75 mg   No facility-administered encounter medications on file as of 07/27/2016.     No results found for this or any previous visit (from the past 2160  hour(s)).    Constitutional:  BP 126/78   Pulse 75   Ht 5\' 9"  (1.753 m)   Wt 233 lb 9.6 oz (106 kg)   BMI 34.50 kg/m    Musculoskeletal: Strength & Muscle Tone: within normal limits Gait & Station: normal Patient leans: N/A  Psychiatric Specialty Exam: General Appearance: Casual  Eye Contact::  Fair  Speech:  Normal Rate  Volume:  Normal  Mood:  Depressed  Affect:  Restricted and Tearful, down  Thought Process:  Coherent  Orientation:  Full (Time, Place, and Person)  Thought Content:  Logical Perceptions: denies AH/Vh  Suicidal Thoughts:  Yes.  without intent/plan, occasionally has plan, but adamantly denies any intent  Homicidal Thoughts:  No  Memory:  Immediate;   Fair Recent;   Fair Remote;   Fair  Judgement:  Fair  Insight:  Good  Psychomotor Activity:  Normal  Concentration:  Good  Recall:  Good  Fund of Knowledge:  Good  Language:  Good  Akathisia:  No  Handed:  Right  AIMS (if indicated):     Assets:  Communication Skills Desire for Improvement Financial Resources/Insurance Housing Physical Health Social Support  ADL's:  Intact  Cognition:  WNL  Sleep:        Established  Problem, Stable/Improving (1), Review of Last Therapy Session (1) and Review of Medication Regimen & Side Effects (2)  Assessment: Miranda Harris is a 55 year old female with bipolar I disorder, anxiety, alcohol use disorder in sustained remission, presented to the clinic for follow up appointment.   # Bipolar disorder Patient continues to endorse neurovegetative symptoms and anxiety since the last encounter. Will uptitrate Effexor to optimize its effect. Will slowly uptitrate given her history of sensitivity to medication.  Will continue lithium as mood stabilizer; no results available on chart; will inquire it. Will start quetiapine prn for insomnia. Risks including metabolic side effects were discussed. Will discontinue Trazodone (fatigue), hydroxyzine (depression) given adverse  events.   Plan:   1. Increase Effexor to 112.5 mg (75+ 37.5 mg) for two weeks, then increase to 150 mg daily 2. Continue Lithium 3. Discontinue Trazodone, hydroxyzine.  4. Start quetiapine 25 -50 mg at night as needed for sleep 5. Return to clinic in one month 6. Referral to therapy  The patient demonstrates the following  risk factors for suicide: Chronic risk factors for suicide include psychiatric disorder /bipolar disorder, history of substance use disorder, family history of suicide attempt. Acute risk factors for suicide include none. Protective factors for this patient include positive social support, responsibility to others (family), coping skills, hope for the future. Although patient reports passive SI, she adamantly denies any intent and has had no previous suicide attempt. Considering these factors, the overall suicide risk at this point appears to be low. Discussed emergency resources including calling 911, suicide crisis line and coming to ED. Patient is advised to call the clinic for earlier appointment if any worsening in her symptoms.  [name], MD 07/27/2016

## 2016-07-27 ENCOUNTER — Encounter (HOSPITAL_COMMUNITY): Payer: Self-pay | Admitting: Psychiatry

## 2016-07-27 ENCOUNTER — Ambulatory Visit (INDEPENDENT_AMBULATORY_CARE_PROVIDER_SITE_OTHER): Payer: 59 | Admitting: Psychiatry

## 2016-07-27 VITALS — BP 126/78 | HR 75 | Ht 69.0 in | Wt 233.6 lb

## 2016-07-27 DIAGNOSIS — R45851 Suicidal ideations: Secondary | ICD-10-CM | POA: Diagnosis not present

## 2016-07-27 DIAGNOSIS — F3130 Bipolar disorder, current episode depressed, mild or moderate severity, unspecified: Secondary | ICD-10-CM

## 2016-07-27 DIAGNOSIS — Z818 Family history of other mental and behavioral disorders: Secondary | ICD-10-CM | POA: Diagnosis not present

## 2016-07-27 DIAGNOSIS — Z79899 Other long term (current) drug therapy: Secondary | ICD-10-CM

## 2016-07-27 MED ORDER — VENLAFAXINE HCL ER 75 MG PO CP24: 37.5000 mg | ORAL_CAPSULE | Freq: Every day | ORAL | 0 refills | Status: DC

## 2016-07-27 MED ORDER — VENLAFAXINE HCL ER 37.5 MG PO CP24: ORAL_CAPSULE | ORAL | 0 refills | Status: DC

## 2016-07-27 MED ORDER — QUETIAPINE FUMARATE 25 MG PO TABS: ORAL_TABLET | ORAL | 0 refills | Status: DC

## 2016-07-27 NOTE — Patient Instructions (Addendum)
1. Increase Effexor to 112.5 mg (75+ 37.5 mg) for two weeks, then increase to 150 mg daily 2. Continue Lithium 3. Discontinue Trazodone, hydroxyzine.  4. Start quetiapine 25 -50 mg at night as needed for sleep 5. Return to clinic in one month 6. Referral to therapy

## 2016-08-08 ENCOUNTER — Other Ambulatory Visit (HOSPITAL_COMMUNITY): Payer: Self-pay

## 2016-08-08 MED ORDER — VENLAFAXINE HCL ER 150 MG PO CP24: 150.0000 mg | ORAL_CAPSULE | Freq: Every day | ORAL | 2 refills | Status: DC

## 2016-08-25 NOTE — Progress Notes (Deleted)
[facility] [phone] Progress Note  Miranda Harris [id number] 55 y.o.  08/29/2016 10:57 AM  Chief Complaint:  "it's depressed again"  History of Present Illness:  Miranda Harris came for her follow-up appointment.   Seroquel, sertraline worked very well for sleep, issues with sleep  Insomnia, felt great,  Depressed, anger, irritable, unmotivated Thinks about the past Monday, start a job, Web designer, excited,  [employer] work, shopping, watching TV Neighbor friend, boring,  Not excited to things,  Once in a while, taking a nap, three times a week,  Denies SI Taking 50 mg qhs Only on fluoxetine, euphoria Mood swings, a couple of weeks, irritability, down,  Acting inappropriately, manic depression, admission for 6 weeks, at age 51 Fear of making a mistake "not good enough" 55 year old "manic episode" after delivery of her baby  One admission,   Lithium, thryroid,   Patient states that she has not been feeling well since the last appointment. She feels exhausted during the day and could not focus at work. Although she started a new job at  [employer] since 10/2, she was laid off Last Friday. She has been trying to find a job and doing house chores. She denies taking a nap during the day. Her husband of 30 years as being very supportive for her. She reports the last time she felt back to herself was about 3 years ago when she worked for 7 years.   She feels depressed. She endorses insomnia. She states trazodone makes her very tired during the day. Although she endorses passive SI with occasional plan, she adamantly denies any intent stating that she had her husband, son and her family. She also states that she does not want her family to suffer as she did, when her father attempted suicide when she was 74.   Suicidal Ideation: Yes Plan Formed: Yes Patient has means to carry out plan: No  Homicidal Ideation: No Plan Formed: No Patient has means to carry out plan:  No  Past Psychiatric History/Hospitalization(s): Per Dr. Marguerite Harris note which verified with patient "Patient has hospitalization at age 52 due to mania and psychosis and depression.  She was hyper verbal, extreme mood swing, sexually promiscuous, paranoia, hallucination and having suicidal thoughts.  She has another episode at age 49 due to noncompliance with medication.  Since then she is taking her medication most of the time.  In the past she had tried Abilify which causes flu like symptoms, Lunesta causes sedation and tired feeling, Ambien which causes sleepwalking, driving and eating and the night, Haldol and Thorazine which causes EPS and shakes and trazodone which cause excessive sedation..  She used to be taking Stelazine and lithium for more than 25 years and she reported no side effects.  She had tried Cymbalta, Zoloft and Wellbutrin but limited response.  She was seeing Dr. Harriette Harris in [place] and [facility].  She was getting Xanax in [place] for insomnia and she was taking 2 mg until recently she has been trying to cut down the dose."  Anxiety: Yes Bipolar Disorder: Yes Depression: Yes Mania: Yes Psychosis: Yes Schizophrenia: No Personality Disorder: No Hospitalization for psychiatric illness: Yes History of Electroconvulsive Shock Therapy: No Prior Suicide Attempts: No  Medical History; Patient has hypothyroidism, bladder problem and chronic pain.  She see Miranda Harris for her primary care needs.  Active Ambulatory Problems    Diagnosis Date Noted  . Affective bipolar disorder ([place]) 06/25/2014  . Arthralgia of hip 06/25/2014  .  Adult hypothyroidism 06/25/2014  . Bursitis, trochanteric 04/19/2015  . Bipolar I disorder, most recent episode depressed ([place]) 06/22/2016   Resolved Ambulatory Problems    Diagnosis Date Noted  . No Resolved Ambulatory Problems   Past Medical History:  Diagnosis Date  . Thyroid disease     Family History; Patient  endorse mother has bipolar-like symptoms but never diagnosed. Her father attempted suicide.   Substance Abuse History; Patient endorse distant history of drinking alcohol heavily however claims to be sober for many years.  She denies any withdrawal symptoms including tremors, shakes or any seizures.  Review of Systems  Constitutional: Negative.   Cardiovascular: Negative for chest pain.  Musculoskeletal: Negative.   Skin: Negative for itching and rash.  Neurological: Negative for dizziness, tremors and headaches.  Psychiatric/Behavioral: Positive for depression and suicidal ideas. Negative for hallucinations and substance abuse. The patient is nervous/anxious and has insomnia.   All other systems reviewed and are negative.   Psychiatric: Agitation: No Hallucination: No Depressed Mood: Yes Insomnia: Yes Hypersomnia: No Altered Concentration: Yes Feels Worthless: Yes Grandiose Ideas: No Belief In Special Powers: No New/Increased Substance Abuse: No Compulsions: No  Neurologic: Headache: No Seizure: No Paresthesias: No   Outpatient Encounter Prescriptions as of 08/29/2016  Medication Sig  . hydrocortisone (ANUSOL-HC) 25 MG suppository Place 25 mg rectally.  [name] ibuprofen (ADVIL,MOTRIN) 800 MG tablet Take 800 mg by mouth every 8 (eight) hours as needed. for pain  . levothyroxine (SYNTHROID, LEVOTHROID) 150 MCG tablet   . lithium 300 MG tablet Take 1 tablet (300 mg total) by mouth 4 (four) times daily.  . QUEtiapine (SEROQUEL) 25 MG tablet Take 25-50 mg at night as needed for sleep  . venlafaxine XR (EFFEXOR-XR) 150 MG 24 hr capsule Take 1 capsule (150 mg total) by mouth daily with breakfast.   No facility-administered encounter medications on file as of 08/29/2016.     No results found for this or any previous visit (from the past 2160 hour(s)).  Labs 06/23/2016 CBC wnl, BMP wnl except Cre 1.10, TSH 0.31, Li 1.2,   Constitutional:  BP 122/74 (BP Location: Left Arm, Patient  Position: Sitting, Cuff Size: Normal)   Pulse 81   Wt 238 lb 6.4 oz (108.1 kg)   BMI 35.21 kg/m    Musculoskeletal: Strength & Muscle Tone: within normal limits Gait & Station: normal Patient leans: N/A  Psychiatric Specialty Exam: General Appearance: Casual  Eye Contact::  Fair  Speech:  Normal Rate  Volume:  Normal  Mood:  Depressed  Affect:  Restricted and Tearful, down  Thought Process:  Coherent  Orientation:  Full (Time, Place, and Person)  Thought Content:  Logical Perceptions: denies AH/Vh  Suicidal Thoughts:  No,  Homicidal Thoughts:  No  Memory:  Immediate;   Fair Recent;   Fair Remote;   Fair  Judgement:  Fair  Insight:  Good  Psychomotor Activity:  Normal  Concentration:  Good  Recall:  Good  Fund of Knowledge:  Good  Language:  Good  Akathisia:  No  Handed:  Right  AIMS (if indicated):     Assets:  Communication Skills Desire for Improvement Financial Resources/Insurance Housing Physical Health Social Support  ADL's:  Intact  Cognition:  WNL  Sleep:        Established Problem, Stable/Improving (1), Review of Last Therapy Session (1) and Review of Medication Regimen & Side Effects (2)  Assessment: Miranda Harris is a 55 year old female with bipolar I disorder, anxiety, alcohol use  disorder in sustained remission, presented to the clinic for follow up appointment.   # Bipolar disorder Patient continues to endorse neurovegetative symptoms and anxiety since the last encounter. Will uptitrate Effexor to optimize its effect. Will slowly uptitrate given her history of sensitivity to medication.  Will continue lithium as mood stabilizer; no results available on chart; will inquire it. Will start quetiapine prn for insomnia. Risks including metabolic side effects were discussed. Will discontinue Trazodone (fatigue), hydroxyzine (depression) given adverse events.   Plan:   1. Increase Effexor to 112.5 mg (75+ 37.5 mg) for two weeks, then increase to 150 mg  daily 2. Continue Lithium 3. Discontinue Trazodone, hydroxyzine.  4. Start quetiapine 25 -50 mg at night as needed for sleep 5. Return to clinic in one month 6. Referral to therapy  The patient demonstrates the following  risk factors for suicide: Chronic risk factors for suicide include psychiatric disorder /bipolar disorder, history of substance use disorder, family history of suicide attempt. Acute risk factors for suicide include none. Protective factors for this patient include positive social support, responsibility to others (family), coping skills, hope for the future. Although patient reports passive SI, she adamantly denies any intent and has had no previous suicide attempt. Considering these factors, the overall suicide risk at this point appears to be low. Discussed emergency resources including calling 911, suicide crisis line and coming to ED. Patient is advised to call the clinic for earlier appointment if any worsening in her symptoms.  [name], MD 08/29/2016

## 2016-08-29 ENCOUNTER — Encounter (HOSPITAL_COMMUNITY): Payer: Self-pay | Admitting: Psychiatry

## 2016-08-29 ENCOUNTER — Ambulatory Visit (INDEPENDENT_AMBULATORY_CARE_PROVIDER_SITE_OTHER): Payer: 59 | Admitting: Psychiatry

## 2016-08-29 DIAGNOSIS — Z79899 Other long term (current) drug therapy: Secondary | ICD-10-CM

## 2016-08-29 DIAGNOSIS — Z818 Family history of other mental and behavioral disorders: Secondary | ICD-10-CM

## 2016-08-29 DIAGNOSIS — F3132 Bipolar disorder, current episode depressed, moderate: Secondary | ICD-10-CM | POA: Diagnosis not present

## 2016-08-29 MED ORDER — VENLAFAXINE HCL ER 150 MG PO CP24: 150.0000 mg | ORAL_CAPSULE | Freq: Every day | ORAL | 2 refills | Status: DC

## 2016-08-29 MED ORDER — VENLAFAXINE HCL ER 37.5 MG PO CP24: ORAL_CAPSULE | ORAL | 1 refills | Status: DC

## 2016-08-29 MED ORDER — QUETIAPINE FUMARATE 100 MG PO TABS: ORAL_TABLET | ORAL | 1 refills | Status: DC

## 2016-08-29 MED ORDER — LITHIUM CARBONATE 600 MG PO CAPS: 600.0000 mg | ORAL_CAPSULE | Freq: Two times a day (BID) | ORAL | 1 refills | Status: DC

## 2016-08-29 NOTE — Progress Notes (Signed)
[facility] MD/PA/NP OP Progress Note  08/29/2016 11:34 AM Olgie Reedus  MRN:  [id number]  Chief Complaint:  Chief Complaint    Follow-up; Depression     Subjective:  "I'm depressed again" HPI:  Miranda Harris came for her follow-up appointment.  She states that the combination of Effexor and Seroquel worked quite well for a few weeks. She was doing very well and her husband told her that she appeared to be back to herself. She started to notice that medication stopped working; she started to feel depressed and she takes a nap a couple of times per week, feeling tired. She tends to dwell on things in the past. She endorses anhedonia and irritability. 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 She feels excited about this and is hoping that she would feel better after starting a job. She is doing house chores, shopping and watching TV. She occasionally visits her friend in the neighborhood. Patient lives with her husband of 30 years who is supportive to her.   She endorses insomnia with nighttime awakening. She denies SI. She denies AH, VH.  She denies tremor or confusion. She reports history of acting inappropriately at age 36, when she was admitted for 6 weeks; she was diagnosed manic depression. She had another time of "manic episode" doing "inappropriate things" one year after she had delivery. She denies any manic episode since then and reports feeling mostly depressed.   Visit Diagnosis:    ICD-9-CM ICD-10-CM   1. Bipolar 1 disorder, depressed, moderate ([facility]) 296.52 F31.32     Past Psychiatric History:  Outpatient: Used to see Dr. Harriette Bouillon in [place] and [facility] Sch[facility] Psychiatry admission: once at age 11 for "manic depression" Previous suicide attempt: denies  Past trials of medication: Sertraline, Duloxetine, Wellbutrin. Abilify (flu like symptoms), Haldol (EPS), Thorazine (EPS), Stelazine, Lithium, Lunesta (sedation), Ambien (sleepwalking, driving, eating), Trazodone  (excessive sedation), Xanax History of violence: denies  Past Medical History:  Past Medical History:  Diagnosis Date  . Thyroid disease     Past Surgical History:  Procedure Laterality Date  . ABDOMINAL HYSTERECTOMY    . HERNIA REPAIR      Family Psychiatric History:  Patient endorse mother has bipolar-like symptoms but never diagnosed. Her father attempted suicide.   Family History:  Family History  Problem Relation Age of Onset  . Anxiety disorder Brother     Social History:  Social History   Social History  . Marital status: Married    Spouse name: N/A  . Number of children: N/A  . Years of education: N/A   Social History Main Topics  . Smoking status: Former Research scientist (life sciences)  . Smokeless tobacco: Never Used  . Alcohol use No     Comment: Occasional use  . Drug use: No  . Sexual activity: Yes    Partners: Male    Birth control/ protection: Surgical   Other Topics Concern  . None   Social History Narrative  . None    Allergies: No Known Allergies  Metabolic Disorder Labs: Lab Results  Component Value Date   HGBA1C 5.4 06/10/2015   MPG 108 06/10/2015   No results found for: PROLACTIN No results found for: CHOL, TRIG, HDL, CHOLHDL, VLDL, LDLCALC   Labs 06/23/2016 CBC wnl, BMP wnl except Cre 1.10, TSH 0.31, Li 1.2,   Current Medications: Current Outpatient Prescriptions  Medication Sig Dispense Refill  . hydrocortisone (ANUSOL-HC) 25 MG suppository Place 25 mg rectally.    [name] ibuprofen (ADVIL,MOTRIN) 800  MG tablet Take 800 mg by mouth every 8 (eight) hours as needed. for pain  0  . levothyroxine (SYNTHROID, LEVOTHROID) 150 MCG tablet     . lithium 600 MG capsule Take 1 capsule (600 mg total) by mouth 2 (two) times daily with a meal. 60 capsule 1  . QUEtiapine (SEROQUEL) 100 MG tablet Take 50 mg - 100 mg as needed for sleep 30 tablet 1  . venlafaxine XR (EFFEXOR-XR) 150 MG 24 hr capsule Take 1 capsule (150 mg total) by mouth daily with breakfast. 30 capsule 2   . venlafaxine XR (EFFEXOR-XR) 37.5 MG 24 hr capsule Take total of 187.5 mg (37.5 mg + 150 mg) for two weeks, then total of 225 mg (75 mg+ 150 mg) 60 capsule 1   No current facility-administered medications for this visit.     Neurologic: Headache: No Seizure: No Paresthesias: No  Musculoskeletal: Strength & Muscle Tone: within normal limits Gait & Station: normal Patient leans: N/A  Psychiatric Specialty Exam: Review of Systems  Constitutional: Positive for malaise/fatigue.  Psychiatric/Behavioral: Positive for depression. Negative for hallucinations, substance abuse and suicidal ideas. The patient is nervous/anxious and has insomnia.   All other systems reviewed and are negative.   Blood pressure 122/74, pulse 81, weight 238 lb 6.4 oz (108.1 kg).Body mass index is 35.21 kg/m.  General Appearance: Well Groomed  Eye Contact:  Good  Speech:  Clear and Coherent  Volume:  Normal  Mood:  Depressed and Irritable  Affect:  Restricted- improving  Thought Process:  Coherent and Goal Directed  Orientation:  Full (Time, Place, and Person)  Thought Content: Logical Perceptions: denies AH/VH  Suicidal Thoughts:  No  Homicidal Thoughts:  No  Memory:  Immediate;   Good Recent;   Good Remote;   Good  Judgement:  Good  Insight:  Good  Psychomotor Activity:  Normal  Concentration:  Concentration: Good and Attention Span: Good  Recall:  Good  Fund of Knowledge: Good  Language: Good  Akathisia:  No  Handed:  Right  AIMS (if indicated):  N/A  Assets:  Communication Skills Desire for Improvement  ADL's:  Intact  Cognition: WNL  Sleep:  poor   Assessment: Laylana Koster is a 55 year old female with bipolar I disorder, anxiety, alcohol use disorder in sustained remission, hypothyroidism who  presented to the clinic for follow up appointment.   # Bipolar I disorder Today's exam is notable for her appropriately brighter affect and she appears to engage well on activities (meeting with  her friend, start working) despite her subjective neurovegetative symptoms. Will increase Effexor to optimize its effect. Will slowly uptitrate given her history of sensitivity to medication.  Will continue lithium as mood stabilizer; lithium is on higher normal range (1.2) but she denies any side effect from it. Continue current dose; side effects which includes tremors, hypothyroidism and confusion are discussed. Will have quetiapine prn for insomnia; which would serve as adjunctive treatment for bipolar as well. Risks including metabolic side effects were discussed. Will discontinue Trazodone (fatigue), hydroxyzine (depression) given adverse events.   # Hypothyroidism TSH 0.31 in 05/2016. Advised patient to see her PCP for evaluation. Lithium dose may be decreased if necessary in the future (although it is more common to cause hypothyroidism).   Plan:   1. Increase venlafaxine: 187.5 mg (150+37.5 mg) daily for two weeks, then 225 mg (150 mg + 37.5 mg x2) daily  2. Increase quetiapine: take 50- 100 mg at night as needed for sleep  3. Continue Lithium 600 mg twice a day 4. Check in with primary care physician for thyroid 5. Return to clinic in January  The patient demonstrates the following  risk factors for suicide: Chronic risk factors for suicide include psychiatric disorder /bipolar disorder, history of substance use disorder, family history of suicide attempt. Acute risk factors for suicide include none. Protective factors for this patient include positive social support, responsibility to others (family), coping skills, hope for the future. Although patient reports passive SI, she adamantly denies any intent and has had no previous suicide attempt. Considering these factors, the overall suicide risk at this point appears to be low. Discussed emergency resources including calling 911, suicide crisis line and coming to ED. Patient is advised to call the clinic for earlier appointment if any  worsening in her symptoms.  Treatment Plan Summary:Plan as above   [name], MD 08/29/2016, 11:34 AM

## 2016-08-29 NOTE — Patient Instructions (Signed)
1. Increase venlafaxine: 187.5 mg (150+37.5 mg) daily for two weeks, then 225 mg (150 mg + 37.5 mg x2) daily  2. Increase quetiapine: you may take 50- 100 mg at night as needed for sleep 3. Continue Lithium 600 mg twice a day 4. Check in with your primary care physician for your thyroid 5. Return to clinic in January

## 2016-08-31 ENCOUNTER — Ambulatory Visit (HOSPITAL_COMMUNITY): Payer: Self-pay | Admitting: Psychology

## 2016-09-07 ENCOUNTER — Ambulatory Visit (HOSPITAL_COMMUNITY): Payer: Self-pay | Admitting: Psychology

## 2016-09-29 ENCOUNTER — Ambulatory Visit (HOSPITAL_COMMUNITY): Payer: Self-pay | Admitting: Psychiatry

## 2016-10-17 ENCOUNTER — Other Ambulatory Visit (HOSPITAL_COMMUNITY): Payer: Self-pay

## 2016-10-17 MED ORDER — QUETIAPINE FUMARATE 100 MG PO TABS: ORAL_TABLET | ORAL | 0 refills | Status: DC

## 2016-10-17 MED ORDER — VENLAFAXINE HCL ER 37.5 MG PO CP24: ORAL_CAPSULE | ORAL | 0 refills | Status: DC

## 2016-10-17 MED ORDER — LITHIUM CARBONATE 600 MG PO CAPS: 600.0000 mg | ORAL_CAPSULE | Freq: Two times a day (BID) | ORAL | 0 refills | Status: DC

## 2016-10-17 MED ORDER — VENLAFAXINE HCL ER 150 MG PO CP24
150.0000 mg | ORAL_CAPSULE | Freq: Every day | ORAL | 0 refills | Status: DC
Start: 2016-10-17 — End: 2016-10-31

## 2016-10-24 ENCOUNTER — Ambulatory Visit (HOSPITAL_COMMUNITY): Payer: Self-pay | Admitting: Psychiatry

## 2016-10-26 ENCOUNTER — Other Ambulatory Visit: Payer: Self-pay | Admitting: Nurse Practitioner

## 2016-10-26 DIAGNOSIS — Z1231 Encounter for screening mammogram for malignant neoplasm of breast: Secondary | ICD-10-CM

## 2016-10-27 ENCOUNTER — Ambulatory Visit: Payer: Self-pay

## 2016-10-27 NOTE — Progress Notes (Addendum)
BH MD/PA/NP OP Progress Note  10/31/2016 9:11 AM Miranda Harris  MRN:  [id number]  Chief Complaint:  Chief Complaint    Depression; Follow-up     Subjective:  "I'm doing better" HPI:  Miranda Harris came for her follow-up appointment. She states that she has been feeling quite well since the last appointment. She believes that current medication works very well for her. 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 She and her husband will close the house soon. She feels more motivated. She endorses insomnia, but reports that quetiapine 100 mg works good for her. She denies SI. She denies AH/VH.    Visit Diagnosis:    ICD-9-CM ICD-10-CM   1. Bipolar 1 disorder, depressed, moderate (HCC) 296.52 F31.32     Past Psychiatric History:  Outpatient: Used to see Dr. Harriette Bouillon in [place] and [facility] [place] -She reports history of acting inappropriately at age 37, when she was admitted for 6 weeks; she was diagnosed manic depression. She had another time of "manic episode" doing "inappropriate things" one year after she had delivery. She denies any manic episode since then and reports feeling mostly depressed.  Psychiatry admission: once at age 8 for "manic depression" Previous suicide attempt: denies  Past trials of medication: Sertraline, Duloxetine, Wellbutrin. Abilify (flu like symptoms), Haldol (EPS), Thorazine (EPS), Stelazine, Lithium, Lunesta (sedation), Ambien (sleepwalking, driving, eating), Trazodone (excessive sedation), Xanax History of violence: denies  Past Medical History:  Past Medical History:  Diagnosis Date  . Thyroid disease     Past Surgical History:  Procedure Laterality Date  . ABDOMINAL HYSTERECTOMY    . HERNIA REPAIR      Family Psychiatric History:  Patient endorse mother has bipolar-like symptoms but never diagnosed. Her father attempted suicide.    Family History:  Family History  Problem Relation Age of Onset  . Anxiety disorder Brother     Social History:  Social History   Social History  . Marital status: Married    Spouse name: N/A  . Number of children: N/A  . Years of education: N/A   Social History Main Topics  . Smoking status: Former Research scientist (life sciences)  . Smokeless tobacco: Never Used  . Alcohol use No     Comment: Occasional use  . Drug use: No  . Sexual activity: Yes    Partners: Male    Birth control/ protection: Surgical   Other Topics Concern  . None   Social History Narrative  . None    Allergies: No Known Allergies  Metabolic Disorder Labs: Lab Results  Component Value Date   HGBA1C 5.4 06/10/2015   MPG 108 06/10/2015   No results found for: PROLACTIN No results found for: CHOL, TRIG, HDL, CHOLHDL, VLDL, LDLCALC   Labs 06/23/2016 CBC wnl, BMP wnl except Cre 1.10, TSH 0.31, Li 1.2,   Current Medications: Current Outpatient Prescriptions  Medication Sig Dispense Refill  . ibuprofen (ADVIL,MOTRIN) 800 MG tablet Take 800 mg by mouth every 8 (eight) hours as needed. for pain  0  . levothyroxine (SYNTHROID, LEVOTHROID) 150 MCG tablet     . lithium 600 MG capsule Take 1 capsule (600 mg total) by mouth 2 (two) times daily with a meal. 180 capsule 0  . QUEtiapine (SEROQUEL) 100 MG tablet Take 50 mg - 100 mg as needed for sleep 90 tablet 0  . venlafaxine XR (EFFEXOR-XR) 150 MG 24 hr capsule  Take 1 capsule (150 mg total) by mouth daily with breakfast. 90 capsule 0  . venlafaxine XR (EFFEXOR-XR) 75 MG 24 hr capsule Take total of 225 mg (75 mg+ 150 mg) 90 capsule 0   No current facility-administered medications for this visit.     Neurologic: Headache: No Seizure: No Paresthesias: No  Musculoskeletal: Strength & Muscle Tone: within normal limits Gait & Station: normal Patient leans: N/A  Psychiatric Specialty Exam: Review of Systems  Constitutional: Positive for malaise/fatigue.   Psychiatric/Behavioral: Positive for depression. Negative for hallucinations, substance abuse and suicidal ideas. The patient is nervous/anxious and has insomnia.   All other systems reviewed and are negative.   Blood pressure 120/80, pulse 89, height 5\' 8"  (1.727 m), weight 236 lb (107 kg).Body mass index is 35.88 kg/m.  General Appearance: Well Groomed  Eye Contact:  Good  Speech:  Clear and Coherent  Volume:  Normal  Mood:  "better"  Affect:  Restricted- improving  Thought Process:  Coherent and Goal Directed  Orientation:  Full (Time, Place, and Person)  Thought Content: Logical Perceptions: denies AH/VH  Suicidal Thoughts:  No  Homicidal Thoughts:  No  Memory:  Immediate;   Good Recent;   Good Remote;   Good  Judgement:  Good  Insight:  Good  Psychomotor Activity:  Normal  Concentration:  Concentration: Good and Attention Span: Good  Recall:  Good  Fund of Knowledge: Good  Language: Good  Akathisia:  No  Handed:  Right  AIMS (if indicated):  N/A  Assets:  Communication Skills Desire for Improvement  ADL's:  Intact  Cognition: WNL  Sleep:  poor   Labs:   Lab Results  Component Value Date   LITHIUM 0.80 06/10/2015   NA 138 06/10/2015   BUN 19 06/10/2015   CREATININE 1.04 06/10/2015  Lithium 1.2 07/2016  10/28/2015 TSH 0.682      Assessment: Miranda Harris is a 56 year old female with bipolar I disorder, anxiety, alcohol use disorder in sustained remission, hypothyroidism who  presented to the clinic for follow up appointment.   # Bipolar I disorder Patient has responded very well to uptitration of Effexor. Will continue current medication. Will continue lithium as mood stabilizer; lithium is on higher normal range (1.2) but she denies any side effect from it. Continue current dose; side effects which includes tremors, hypothyroidism and confusion are discussed.   # Insomnia Patient reports insomnia which responds well to quetiapine; continue current  medication, which would serve as adjunctive treatment for bipolar as well. Risks including metabolic side effects were discussed.   # Hypothyroidism Reviewed Labs done by her PCP; euthyroid in 10/2015. Will consider monitoring in 6 months given patient is on lithium.   Plan:   1. Continue venlafaxine 225 mg (150 mg + 75 mg) daily  2. Continue quetiapine 50-100 mg at night as needed for sleep 3. Continue Lithium 600 mg twice a day 4. Return to clinic in 3 months (Repeat lithium, TSH in May)  The patient demonstrates the following  risk factors for suicide: Chronic risk factors for suicide include psychiatric disorder /bipolar disorder, history of substance use disorder, family history of suicide attempt. Acute risk factors for suicide include none. Protective factors for this patient include positive social support, responsibility to others (family), coping skills, hope for the future. Although patient reports passive SI, she adamantly denies any intent and has had no previous suicide attempt. Considering these factors, the overall suicide risk at this point appears to be  low. Discussed emergency resources including calling 911, suicide crisis line and coming to ED. Patient is advised to call the clinic for earlier appointment if any worsening in her symptoms.  Treatment Plan Summary:Plan as above   [name], MD 10/31/2016, 9:11 AM

## 2016-10-31 ENCOUNTER — Encounter (HOSPITAL_COMMUNITY): Payer: Self-pay | Admitting: Psychiatry

## 2016-10-31 ENCOUNTER — Ambulatory Visit (INDEPENDENT_AMBULATORY_CARE_PROVIDER_SITE_OTHER): Payer: 59 | Admitting: Psychiatry

## 2016-10-31 VITALS — BP 120/80 | HR 89 | Ht 68.0 in | Wt 236.0 lb

## 2016-10-31 DIAGNOSIS — Z79899 Other long term (current) drug therapy: Secondary | ICD-10-CM

## 2016-10-31 DIAGNOSIS — Z818 Family history of other mental and behavioral disorders: Secondary | ICD-10-CM | POA: Diagnosis not present

## 2016-10-31 DIAGNOSIS — Z87891 Personal history of nicotine dependence: Secondary | ICD-10-CM | POA: Diagnosis not present

## 2016-10-31 DIAGNOSIS — F3132 Bipolar disorder, current episode depressed, moderate: Secondary | ICD-10-CM

## 2016-10-31 MED ORDER — QUETIAPINE FUMARATE 100 MG PO TABS: ORAL_TABLET | ORAL | 0 refills | Status: DC

## 2016-10-31 MED ORDER — LITHIUM CARBONATE 600 MG PO CAPS: 600.0000 mg | ORAL_CAPSULE | Freq: Two times a day (BID) | ORAL | 0 refills | Status: DC

## 2016-10-31 MED ORDER — VENLAFAXINE HCL ER 75 MG PO CP24: ORAL_CAPSULE | ORAL | 0 refills | Status: DC

## 2016-10-31 MED ORDER — VENLAFAXINE HCL ER 150 MG PO CP24: 150.0000 mg | ORAL_CAPSULE | Freq: Every day | ORAL | 0 refills | Status: DC

## 2016-10-31 NOTE — Patient Instructions (Addendum)
1. Continue venlafaxine 225 mg (150 mg + 75 mg) daily  2. Continue quetiapine 50-100 mg at night as needed for sleep 3. Continue Lithium 600 mg twice a day 4. Return to clinic in 3 months

## 2016-11-03 ENCOUNTER — Telehealth (HOSPITAL_COMMUNITY): Payer: Self-pay

## 2016-11-03 ENCOUNTER — Encounter (HOSPITAL_COMMUNITY): Payer: Self-pay | Admitting: Psychiatry

## 2016-11-03 NOTE — Telephone Encounter (Signed)
Please confirm with patient whether it is Trazodone or Quetiapine (as I did not prescribe Trazodone at the last visit). It is fine to prescribe either of them 200 mg qhs.

## 2016-11-03 NOTE — Telephone Encounter (Signed)
Patient is calling because when she picked up her Trazodone and read the label, she realized that she had been taking it wrong. She thought they were 50 mg tabs and she had been taking 2 of them. She now knows they are 100 mg tabs, but she says she still needs to take 2 of them for her to be able to sleep through the night. Patient is requesting a new prescription for 2 tabs of 100 mg qhs. Please review and advise, thank you

## 2016-11-03 NOTE — Telephone Encounter (Signed)
It is quetiapine, I apologize - that was my mistake! I let patient know that she could take 200 mg, and to call me if she ran out before appointment

## 2016-11-10 ENCOUNTER — Ambulatory Visit
Admission: RE | Admit: 2016-11-10 | Discharge: 2016-11-10 | Disposition: A | Discharge status: Home / Self Care | Payer: Managed Care, Other (non HMO) | Source: Ambulatory Visit | Attending: Nurse Practitioner | Admitting: Nurse Practitioner

## 2016-11-10 DIAGNOSIS — Z1231 Encounter for screening mammogram for malignant neoplasm of breast: Secondary | ICD-10-CM

## 2016-11-10 NOTE — Telephone Encounter (Signed)
This encounter was created in error - please disregard.

## 2016-12-19 ENCOUNTER — Other Ambulatory Visit (HOSPITAL_COMMUNITY): Payer: Self-pay

## 2016-12-19 ENCOUNTER — Telehealth (HOSPITAL_COMMUNITY): Payer: Self-pay

## 2016-12-19 MED ORDER — QUETIAPINE FUMARATE 200 MG PO TABS: ORAL_TABLET | ORAL | 1 refills | Status: DC

## 2016-12-19 NOTE — Telephone Encounter (Signed)
Patient called and said that she does not follow up with you until May - she is coming to the [place] office to see you - and she was about out of her Quetiapine. I sent a refill to the pharmacy for 1 month with a refill to get her to the appointment

## 2016-12-19 NOTE — Telephone Encounter (Signed)
Noted  

## 2017-01-05 NOTE — Progress Notes (Addendum)
BH MD/PA/NP OP Progress Note  01/11/2017 2:58 PM Miranda Harris  MRN:  [id number]  Chief Complaint:  Chief Complaint    Depression; Follow-up     Subjective:  "I'm doing well" HPI:  Miranda Harris came for her follow-up appointment. She states that she has been doing well since the last appointment. She feels quetiapine has been working for her, although she is slightly concerned that she does not have any dream. She talks about an history of "no REM sleep" while on Xanax in the past, based on sleep study. She was feeling worse and had sleep walking. She also reports she feels drowsy later in the day; reports Effexor may be helping her to have more energy during the day. 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 She enjoys her job. She and her husband bought a house and she feels good about it. She reports good appetite. She sleeps well. She denies SI, HI, Ah/VH.  Wt Readings from Last 3 Encounters:  10/31/16 236 lb (107 kg)  08/29/16 238 lb 6.4 oz (108.1 kg)  07/27/16 233 lb 9.6 oz (106 kg)    Visit Diagnosis:    ICD-9-CM ICD-10-CM   1. Bipolar I disorder, most recent episode depressed ([place]) 296.50 F31.30     Past Psychiatric History:  Outpatient: Used to see Dr. Harriette Bouillon in [place] and [facility] -She reports history of acting inappropriately at age 61, when she was admitted for 6 weeks; she was diagnosed manic depression. She had another time of "manic episode" doing "inappropriate things" one year after she had delivery. She denies any manic episode since then and reports feeling mostly depressed.  Psychiatry admission: once at age 30 for "manic depression" Previous suicide attempt: denies  Past trials of medication: Sertraline, Duloxetine, Wellbutrin. Abilify (flu like symptoms), Haldol (EPS), Thorazine (EPS), Stelazine, Lithium, Lunesta (sedation), Ambien (sleepwalking, driving, eating), Trazodone (excessive sedation), Xanax History of  violence: denies  Past Medical History:  Past Medical History:  Diagnosis Date  . Thyroid disease     Past Surgical History:  Procedure Laterality Date  . ABDOMINAL HYSTERECTOMY    . HERNIA REPAIR      Family Psychiatric History:  Patient endorse mother has bipolar-like symptoms but never diagnosed. Her father attempted suicide.   Family History:  Family History  Problem Relation Age of Onset  . Anxiety disorder Brother     Social History:  Social History   Social History  . Marital status: Married    Spouse name: N/A  . Number of children: N/A  . Years of education: N/A   Social History Main Topics  . Smoking status: Former Research scientist (life sciences)  . Smokeless tobacco: Never Used  . Alcohol use No     Comment: Occasional use  . Drug use: No  . Sexual activity: Yes    Partners: Male    Birth control/ protection: Surgical   Other Topics Concern  . Not on file   Social History Narrative  . No narrative on file    Allergies: No Known Allergies  Metabolic Disorder Labs: Lab Results  Component Value Date   HGBA1C 5.4 06/10/2015   MPG 108 06/10/2015   No results found for: PROLACTIN No results found for: CHOL, TRIG, HDL, CHOLHDL, VLDL, LDLCALC   Labs 06/23/2016 CBC wnl, BMP wnl except Cre 1.10, TSH 0.31, Li 1.2,   Current Medications: Current Outpatient Prescriptions  Medication Sig Dispense Refill  . ibuprofen (ADVIL,MOTRIN) 800 MG tablet  Take 800 mg by mouth every 8 (eight) hours as needed. for pain  0  . levothyroxine (SYNTHROID, LEVOTHROID) 150 MCG tablet     . lithium 600 MG capsule Take 1 capsule (600 mg total) by mouth 2 (two) times daily with a meal. 180 capsule 1  . QUEtiapine (SEROQUEL) 200 MG tablet Take 1 tablet (200 mg total) by mouth at bedtime. 90 tablet 1  . venlafaxine XR (EFFEXOR-XR) 150 MG 24 hr capsule Take 1 capsule (150 mg total) by mouth daily with breakfast. 90 capsule 1  . venlafaxine XR (EFFEXOR-XR) 75 MG 24 hr capsule Take total of 225 mg (75  mg+ 150 mg) 90 capsule 1   No current facility-administered medications for this visit.     Neurologic: Headache: No Seizure: No Paresthesias: No  Musculoskeletal: Strength & Muscle Tone: within normal limits Gait & Station: normal Patient leans: N/A  Psychiatric Specialty Exam: Review of Systems  Constitutional: Positive for malaise/fatigue.  Psychiatric/Behavioral: Negative for depression, hallucinations, substance abuse and suicidal ideas. The patient is not nervous/anxious and does not have insomnia.   All other systems reviewed and are negative.   There were no vitals taken for this visit.There is no height or weight on file to calculate BMI.- although patient is reminded to take vital signs before she leaves, she left the clinic without taking it. It was likely due to prolonged waiting time.  General Appearance: Well Groomed  Eye Contact:  Good  Speech:  Clear and Coherent  Volume:  Normal  Mood:  "good"  Affect:  Appropriate and Congruent smiles at times- significantly improved  Thought Process:  Coherent and Goal Directed  Orientation:  Full (Time, Place, and Person)  Thought Content: Logical Perceptions: denies AH/VH  Suicidal Thoughts:  No  Homicidal Thoughts:  No  Memory:  Immediate;   Good Recent;   Good Remote;   Good  Judgement:  Good  Insight:  Good  Psychomotor Activity:  Normal  Concentration:  Concentration: Good and Attention Span: Good  Recall:  Good  Fund of Knowledge: Good  Language: Good  Akathisia:  No  Handed:  Right  AIMS (if indicated):  N/A  Assets:  Communication Skills Desire for Improvement  ADL's:  Intact  Cognition: WNL  Sleep:  good   Labs:   Lab Results  Component Value Date   LITHIUM 0.80 06/10/2015   NA 138 06/10/2015   BUN 19 06/10/2015   CREATININE 1.04 06/10/2015  Lithium 1.2 07/2016  10/28/2015 TSH 0.682      Assessment: Miranda Harris is a 56 year old female with bipolar I disorder, anxiety, alcohol use  disorder in sustained remission, hypothyroidism who  presented to the clinic for follow up appointment.   # Bipolar I disorder Patient has responded very well to uptitration of Effexor and quetiapine. Will continue current medication. Risks including metabolic side effects are discussed. Will continue lithium as mood stabilizer; she will see her PCP to get her levels checked.   # Insomnia Discussed behavioral activation. She has responded very well to uptitration of seroquel; will continue current dose. Of note, patient reports history of sleep walking when she used to be on Xanax. She is advised to contact the clinic if any issues with her sleep.   Plan:   1. Continue venlafaxine 225 mg (150 mg + 75 mg) daily  2. Continue quetiapine 200 mg every night 3. Continue Lithium 600 mg twice a day 4. Return to clinic in 4 months for 30  mins (Patient will call the clinic if any worsening in her symptoms) 5. Patient to inform her PCP to send lithium, thyroid test to our office  The patient demonstrates the following  risk factors for suicide: Chronic risk factors for suicide include psychiatric disorder /bipolar disorder, history of substance use disorder, family history of suicide attempt. Acute risk factors for suicide include none. Protective factors for this patient include positive social support, responsibility to others (family), coping skills, hope for the future. Although patient reports passive SI, she adamantly denies any intent and has had no previous suicide attempt. Considering these factors, the overall suicide risk at this point appears to be low. Discussed emergency resources including calling 911, suicide crisis line and coming to ED. Patient is advised to call the clinic for earlier appointment if any worsening in her symptoms.  Treatment Plan Summary:Plan as above  [name], MD 01/11/2017, 2:58 PM

## 2017-01-11 ENCOUNTER — Ambulatory Visit (INDEPENDENT_AMBULATORY_CARE_PROVIDER_SITE_OTHER): Payer: 59 | Admitting: Psychiatry

## 2017-01-11 DIAGNOSIS — Z79899 Other long term (current) drug therapy: Secondary | ICD-10-CM | POA: Diagnosis not present

## 2017-01-11 DIAGNOSIS — G4700 Insomnia, unspecified: Secondary | ICD-10-CM | POA: Diagnosis not present

## 2017-01-11 DIAGNOSIS — F3130 Bipolar disorder, current episode depressed, mild or moderate severity, unspecified: Secondary | ICD-10-CM | POA: Diagnosis not present

## 2017-01-11 DIAGNOSIS — Z818 Family history of other mental and behavioral disorders: Secondary | ICD-10-CM | POA: Diagnosis not present

## 2017-01-11 DIAGNOSIS — Z87891 Personal history of nicotine dependence: Secondary | ICD-10-CM

## 2017-01-11 MED ORDER — VENLAFAXINE HCL ER 150 MG PO CP24: 150.0000 mg | ORAL_CAPSULE | Freq: Every day | ORAL | 1 refills | Status: DC

## 2017-01-11 MED ORDER — QUETIAPINE FUMARATE 200 MG PO TABS: 200.0000 mg | ORAL_TABLET | Freq: Every day | ORAL | 1 refills | Status: DC

## 2017-01-11 MED ORDER — VENLAFAXINE HCL ER 75 MG PO CP24: ORAL_CAPSULE | ORAL | 1 refills | Status: DC

## 2017-01-11 MED ORDER — LITHIUM CARBONATE 600 MG PO CAPS: 600.0000 mg | ORAL_CAPSULE | Freq: Two times a day (BID) | ORAL | 1 refills | Status: DC

## 2017-01-11 NOTE — Addendum Note (Signed)
Addended by: [name] on: 01/11/2017 02:58 PM   Modules accepted: Level of Service

## 2017-01-11 NOTE — Patient Instructions (Addendum)
1. Continue venlafaxine 225 mg (150 mg + 75 mg) daily  2. Continue quetiapine 200 mg every night 3. Continue Lithium 600 mg twice a day 4. Return to clinic in 4 months for 30 mins 5. Please send lithium, thyroid test to our office

## 2017-01-26 ENCOUNTER — Telehealth (HOSPITAL_COMMUNITY): Payer: Self-pay | Admitting: Psychiatry

## 2017-01-26 NOTE — Telephone Encounter (Signed)
Fax received; reviewed labs results on 01/17/2017 Lithium 1.2 TSH 2.86 BMP wnl- Cre 1.13, BUN 21

## 2017-02-06 ENCOUNTER — Ambulatory Visit (HOSPITAL_COMMUNITY): Payer: Self-pay | Admitting: Psychiatry

## 2017-02-09 ENCOUNTER — Other Ambulatory Visit (HOSPITAL_COMMUNITY): Payer: Self-pay | Admitting: Psychiatry

## 2017-02-09 ENCOUNTER — Telehealth (HOSPITAL_COMMUNITY): Payer: Self-pay | Admitting: *Deleted

## 2017-02-09 MED ORDER — QUETIAPINE FUMARATE 100 MG PO TABS: 100.0000 mg | ORAL_TABLET | Freq: Every day | ORAL | 3 refills | Status: DC

## 2017-02-09 MED ORDER — QUETIAPINE FUMARATE 100 MG PO TABS: 150.0000 mg | ORAL_TABLET | Freq: Every day | ORAL | 3 refills | Status: DC

## 2017-02-09 NOTE — Telephone Encounter (Signed)
Discussed with patient. Will try quetiapine 100 mg at night. She will see her PCP to get blood test (TSH, BMP, Lithium) and will have earlier appointment at this clinic. Reviewed symptoms of lithium toxicity. She is advised to go to urgent care if she develops those symptoms.

## 2017-02-09 NOTE — Telephone Encounter (Signed)
phone call from patient, said Seroquel is no REM sleep, getting really tired, also excessive urination,

## 2017-02-09 NOTE — Telephone Encounter (Signed)
Discussed with patient. She states that she has had no dreams, feels confused at times (unable to get back home) and has increased urinary frequency. She is concerned about side effect from quetiapine as she experienced side effect from Xanax (no dreams, later sleep walking) in the past. She denies polydipsia and she denies tremors. Her speech is intact. Last labs with Na 141 and Cre 1.13, Glu 51, Lithium 1.2 on 12/2016. She is scheduled to see her PCP next week; she is advised to check labs again to rule out medical cause (hyponatremia secondary to antidepressant, diabetes insipidus secondary to lithium and lithium toxicity). Will decrease quetiapine to avoid worsening in her confusion and given patient strong preference. Patient is advised to contact the clinic if any worsening symptoms despite this change.  - Decrease quetiapine 150 mg qhs

## 2017-02-09 NOTE — Telephone Encounter (Signed)
please call patient back.  She called back at 3:40 p.m. she said the side effects are so bad for the Seroquel that she just want to get off it, and get on something else.

## 2017-02-15 NOTE — Telephone Encounter (Signed)
noted 

## 2017-02-16 NOTE — Progress Notes (Signed)
[facility] MD/PA/NP OP Progress Note  02/17/2017 12:09 PM Miranda Harris  MRN:  [id number]  Chief Complaint:  Chief Complaint    Depression; Follow-up     Subjective:  "I'm doing well" HPI:  - Since the last appointment, quetiapine was tapered down to 100 mg qhs with concern for adverse events of confusion.  - She was seen by PCP, endorsed diarrhea and dysuria;   Patient presents for follow up appointment. She states that there was improvement in her fatigue and confusion since she was treated for UTI. She has significant concern about quetiapine; she describes in details about the episode she had while she was on Xanax before. She wants to make sure that she has enough "REM sleep" so that she would not experience the same event. She quit the part time job, as the system did not work for her and reports there were similar complaints from other people as well.  She feels good and denies feeling depressed. She has good motivation. She has good appetite. She denies SI. She has mild insomnia with difficulty initiating sleep, but feels rested in the morning. She denies decreased need for sleep or euphoria.   Wt Readings from Last 3 Encounters:  02/17/17 231 lb 6.4 oz (105 kg)  10/31/16 236 lb (107 kg)  08/29/16 238 lb 6.4 oz (108.1 kg)    Visit Diagnosis:    ICD-9-CM ICD-10-CM   1. Bipolar I disorder, most recent episode depressed ([place]) 296.50 F31.30     Past Psychiatric History:  Outpatient: Used to see Dr. Harriette Bouillon in [place] and [place] Cand[place] [place] [facility] -She reports history of acting inappropriately at age 30, when she was admitted for 6 weeks; she was diagnosed manic depression. She had another time of "manic episode" doing "inappropriate things" one year after she had delivery. She denies any manic episode since then and reports feeling mostly depressed.  Psychiatry admission: once at age 40 for "manic depression" Previous suicide attempt: denies  Past trials of medication: Sertraline,  Duloxetine, Wellbutrin. Abilify (flu like symptoms), Haldol (EPS), Thorazine (EPS), Stelazine, Lithium, Lunesta (sedation), Ambien (sleepwalking, driving, eating), Trazodone (excessive sedation), Xanax History of violence: denies  Past Medical History:  Past Medical History:  Diagnosis Date  . Thyroid disease     Past Surgical History:  Procedure Laterality Date  . ABDOMINAL HYSTERECTOMY    . HERNIA REPAIR      Family Psychiatric History:  Patient endorse mother has bipolar-like symptoms but never diagnosed. Her father attempted suicide.   Family History:  Family History  Problem Relation Age of Onset  . Anxiety disorder Brother     Social History:  Social History   Social History  . Marital status: Married    Spouse name: N/A  . Number of children: N/A  . Years of education: N/A   Social History Main Topics  . Smoking status: Former Research scientist (life sciences)  . Smokeless tobacco: Never Used  . Alcohol use No     Comment: Occasional use  . Drug use: No  . Sexual activity: Yes    Partners: Male    Birth control/ protection: Surgical   Other Topics Concern  . Not on file   Social History Narrative  . No narrative on file    Allergies: No Known Allergies  Metabolic Disorder Labs: Lab Results  Component Value Date   HGBA1C 5.4 06/10/2015   MPG 108 06/10/2015   No results found for: PROLACTIN No results found for: CHOL, TRIG, HDL, CHOLHDL, VLDL, LDLCALC  Labs  CBC wnl, BMP wnl except Cre 1.10, TSH 0.31, Li 1.2, on 06/23/2016 Na 141 and Cre 1.13, Glu 51, Lithium 1.2, TSH 2/86  on 12/2016  Current Medications: Current Outpatient Prescriptions  Medication Sig Dispense Refill  . ibuprofen (ADVIL,MOTRIN) 800 MG tablet Take 800 mg by mouth every 8 (eight) hours as needed. for pain  0  . levothyroxine (SYNTHROID, LEVOTHROID) 150 MCG tablet     . lithium 600 MG capsule Take 1 capsule (600 mg total) by mouth 2 (two) times daily with a meal. 180 capsule 1  . QUEtiapine (SEROQUEL)  100 MG tablet Take 1.5 tablets (150 mg total) by mouth at bedtime. 135 tablet 1  . venlafaxine XR (EFFEXOR-XR) 150 MG 24 hr capsule Take 1 capsule (150 mg total) by mouth daily with breakfast. 90 capsule 1  . venlafaxine XR (EFFEXOR-XR) 75 MG 24 hr capsule Take total of 225 mg (75 mg+ 150 mg) 90 capsule 1   No current facility-administered medications for this visit.     Neurologic: Headache: No Seizure: No Paresthesias: No  Musculoskeletal: Strength & Muscle Tone: within normal limits Gait & Station: normal Patient leans: N/A  Psychiatric Specialty Exam: Review of Systems  Constitutional: Negative for malaise/fatigue.  Psychiatric/Behavioral: Negative for depression, hallucinations, substance abuse and suicidal ideas. The patient has insomnia. The patient is not nervous/anxious.   All other systems reviewed and are negative.   Blood pressure 120/88, pulse 84, height 5\' 8"  (1.727 m), weight 231 lb 6.4 oz (105 kg), SpO2 99 %.Body mass index is 35.18 kg/m.  General Appearance: Well Groomed  Eye Contact:  Good  Speech:  Clear and Coherent  Volume:  Normal  Mood:  "good"  Affect:  Appropriate and Congruent smiles, euthymic  Thought Process:  Coherent and Goal Directed  Orientation:  Full (Time, Place, and Person)  Thought Content: Logical Perceptions: denies AH/VH  Suicidal Thoughts:  No  Homicidal Thoughts:  No  Memory:  Immediate;   Good Recent;   Good Remote;   Good  Judgement:  Good  Insight:  Good  Psychomotor Activity:  Normal  Concentration:  Concentration: Good and Attention Span: Good  Recall:  Good  Fund of Knowledge: Good  Language: Good  Akathisia:  No  Handed:  Right  AIMS (if indicated):  N/A  Assets:  Communication Skills Desire for Improvement  ADL's:  Intact  Cognition: WNL  Sleep:  fair    Assessment: Miranda Harris is a 56 year old female with bipolar I disorder, anxiety, alcohol use disorder in sustained remission, hypothyroidism who presented to  the clinic for follow up appointment. This appointment was made urgently to discuss concern about quetiapine.  # Bipolar I disorder Patient reports significant concern about quetiapine may affect her sleep architecture. Discussed in length regarding the benefit and side effect from quetiapine. Given she has had good response to quetiapine as adjunctive treatment for her mood  , it would be beneficial for her to try slow taper so that she would not experience any worsening in her symptoms. Will continue Effexor to target depression. Will continue lithium as mood stabilizer. Discussed lithium toxicity and she is encouraged to hydrate enough during summer time.   # Insomnia Discussed behavioral activation. Although she has responded very well up quetiapine, she would like to taper it off given concern of its impact on her sleep architecture. Will slowly taper own as described above.   Plan:   1. Continue venlafaxine 225 mg (150 mg + 75  mg) daily  2. Continue quetiapine 150 mg every night 3. Continue Lithium 600 mg twice a day 4. Return to clinic in September for 30 mins   The patient demonstrates the following  risk factors for suicide: Chronic risk factors for suicide include psychiatric disorder /bipolar disorder, history of substance use disorder, family history of suicide attempt. Acute risk factors for suicide include none. Protective factors for this patient include positive social support, responsibility to others (family), coping skills, hope for the future. Although patient reports passive SI, she adamantly denies any intent and has had no previous suicide attempt. Considering these factors, the overall suicide risk at this point appears to be low. Discussed emergency resources including calling 911, suicide crisis line and coming to ED. Patient is advised to call the clinic for earlier appointment if any worsening in her symptoms.  Treatment Plan Summary:Plan as above  The duration of  this appointment visit was 30 minutes of face-to-face time with the patient.  Greater than 50% of this time was spent in counseling, explanation of  diagnosis, planning of further management, and coordination of care.  [name], MD 02/17/2017, 12:09 PM

## 2017-02-17 ENCOUNTER — Ambulatory Visit (INDEPENDENT_AMBULATORY_CARE_PROVIDER_SITE_OTHER): Payer: 59 | Admitting: Psychiatry

## 2017-02-17 VITALS — BP 120/88 | HR 84 | Ht 68.0 in | Wt 231.4 lb

## 2017-02-17 DIAGNOSIS — E039 Hypothyroidism, unspecified: Secondary | ICD-10-CM | POA: Diagnosis not present

## 2017-02-17 DIAGNOSIS — F1011 Alcohol abuse, in remission: Secondary | ICD-10-CM

## 2017-02-17 DIAGNOSIS — F3130 Bipolar disorder, current episode depressed, mild or moderate severity, unspecified: Secondary | ICD-10-CM | POA: Diagnosis not present

## 2017-02-17 DIAGNOSIS — F419 Anxiety disorder, unspecified: Secondary | ICD-10-CM | POA: Diagnosis not present

## 2017-02-17 DIAGNOSIS — Z818 Family history of other mental and behavioral disorders: Secondary | ICD-10-CM

## 2017-02-17 DIAGNOSIS — G4700 Insomnia, unspecified: Secondary | ICD-10-CM

## 2017-02-17 DIAGNOSIS — Z87891 Personal history of nicotine dependence: Secondary | ICD-10-CM

## 2017-02-17 MED ORDER — LITHIUM CARBONATE 600 MG PO CAPS: 600.0000 mg | ORAL_CAPSULE | Freq: Two times a day (BID) | ORAL | 1 refills | Status: DC

## 2017-02-17 MED ORDER — VENLAFAXINE HCL ER 75 MG PO CP24: ORAL_CAPSULE | ORAL | 1 refills | Status: DC

## 2017-02-17 MED ORDER — QUETIAPINE FUMARATE 100 MG PO TABS: 150.0000 mg | ORAL_TABLET | Freq: Every day | ORAL | 1 refills | Status: DC

## 2017-02-17 MED ORDER — VENLAFAXINE HCL ER 150 MG PO CP24: 150.0000 mg | ORAL_CAPSULE | Freq: Every day | ORAL | 1 refills | Status: DC

## 2017-02-17 NOTE — Patient Instructions (Addendum)
1. Continue venlafaxine 225 mg (150 mg + 75 mg) daily  2. Continue quetiapine 150 mg every night 3. Continue Lithium 600 mg twice a day 4. Return to clinic in September for 30 mins

## 2017-04-11 ENCOUNTER — Other Ambulatory Visit: Payer: Self-pay | Admitting: Surgery

## 2017-04-18 ENCOUNTER — Encounter (HOSPITAL_COMMUNITY)
Admission: EM | Disposition: A | Discharge status: Home / Self Care | Payer: Self-pay | Source: Home / Self Care | Attending: Emergency Medicine

## 2017-04-18 ENCOUNTER — Emergency Department (HOSPITAL_COMMUNITY): Payer: Managed Care, Other (non HMO)

## 2017-04-18 ENCOUNTER — Encounter (HOSPITAL_COMMUNITY): Payer: Self-pay | Admitting: Certified Registered"

## 2017-04-18 ENCOUNTER — Encounter (HOSPITAL_COMMUNITY): Payer: Self-pay

## 2017-04-18 ENCOUNTER — Emergency Department (HOSPITAL_COMMUNITY): Payer: Managed Care, Other (non HMO) | Admitting: Certified Registered"

## 2017-04-18 ENCOUNTER — Ambulatory Visit (HOSPITAL_COMMUNITY)
Admission: EM | Admit: 2017-04-18 | Discharge: 2017-04-19 | Disposition: A | Discharge status: Home / Self Care | Payer: Managed Care, Other (non HMO) | Attending: Emergency Medicine | Admitting: Emergency Medicine

## 2017-04-18 ENCOUNTER — Encounter (HOSPITAL_COMMUNITY): Payer: Self-pay | Admitting: Emergency Medicine

## 2017-04-18 DIAGNOSIS — L0291 Cutaneous abscess, unspecified: Secondary | ICD-10-CM

## 2017-04-18 DIAGNOSIS — E669 Obesity, unspecified: Secondary | ICD-10-CM | POA: Insufficient documentation

## 2017-04-18 DIAGNOSIS — K612 Anorectal abscess: Secondary | ICD-10-CM | POA: Insufficient documentation

## 2017-04-18 DIAGNOSIS — Z87891 Personal history of nicotine dependence: Secondary | ICD-10-CM | POA: Diagnosis not present

## 2017-04-18 DIAGNOSIS — F319 Bipolar disorder, unspecified: Secondary | ICD-10-CM | POA: Diagnosis not present

## 2017-04-18 DIAGNOSIS — Z79899 Other long term (current) drug therapy: Secondary | ICD-10-CM | POA: Diagnosis not present

## 2017-04-18 DIAGNOSIS — Z6832 Body mass index (BMI) 32.0-32.9, adult: Secondary | ICD-10-CM | POA: Diagnosis not present

## 2017-04-18 DIAGNOSIS — E039 Hypothyroidism, unspecified: Secondary | ICD-10-CM | POA: Diagnosis not present

## 2017-04-18 DIAGNOSIS — F3130 Bipolar disorder, current episode depressed, mild or moderate severity, unspecified: Secondary | ICD-10-CM

## 2017-04-18 HISTORY — PX: INCISION AND DRAINAGE PERIRECTAL ABSCESS: SHX1804

## 2017-04-18 LAB — CBC WITH DIFFERENTIAL/PLATELET
BASOS ABS: 0.1 10*3/uL (ref 0.0–0.1)
BASOS PCT: 1 %
Eosinophils Absolute: 0.4 10*3/uL (ref 0.0–0.7)
Eosinophils Relative: 4 %
HEMATOCRIT: 38.8 % (ref 36.0–46.0)
Hemoglobin: 12.6 g/dL (ref 12.0–15.0)
Lymphocytes Relative: 20 %
Lymphs Abs: 2.1 10*3/uL (ref 0.7–4.0)
MCH: 30.1 pg (ref 26.0–34.0)
MCHC: 32.5 g/dL (ref 30.0–36.0)
MCV: 92.6 fL (ref 78.0–100.0)
MONO ABS: 0.6 10*3/uL (ref 0.1–1.0)
Monocytes Relative: 6 %
NEUTROS ABS: 7 10*3/uL (ref 1.7–7.7)
NEUTROS PCT: 69 %
Platelets: 336 10*3/uL (ref 150–400)
RBC: 4.19 MIL/uL (ref 3.87–5.11)
RDW: 13.7 % (ref 11.5–15.5)
WBC: 10.1 10*3/uL (ref 4.0–10.5)

## 2017-04-18 LAB — COMPREHENSIVE METABOLIC PANEL
ALK PHOS: 85 U/L (ref 38–126)
ALT: 24 U/L (ref 14–54)
ANION GAP: 9 (ref 5–15)
AST: 23 U/L (ref 15–41)
Albumin: 4.1 g/dL (ref 3.5–5.0)
BILIRUBIN TOTAL: 0.5 mg/dL (ref 0.3–1.2)
BUN: 23 mg/dL — ABNORMAL HIGH (ref 6–20)
CALCIUM: 10 mg/dL (ref 8.9–10.3)
CO2: 21 mmol/L — ABNORMAL LOW (ref 22–32)
Chloride: 106 mmol/L (ref 101–111)
Creatinine, Ser: 1.43 mg/dL — ABNORMAL HIGH (ref 0.44–1.00)
GFR, EST AFRICAN AMERICAN: 46 mL/min — AB (ref >60)
GFR, EST NON AFRICAN AMERICAN: 40 mL/min — AB (ref >60)
GLUCOSE: 94 mg/dL (ref 65–99)
Potassium: 4.5 mmol/L (ref 3.5–5.1)
Sodium: 136 mmol/L (ref 135–145)
TOTAL PROTEIN: 7.7 g/dL (ref 6.5–8.1)

## 2017-04-18 LAB — URINALYSIS, ROUTINE W REFLEX MICROSCOPIC
BILIRUBIN URINE: NEGATIVE
Glucose, UA: NEGATIVE mg/dL
HGB URINE DIPSTICK: NEGATIVE
Ketones, ur: NEGATIVE mg/dL
NITRITE: NEGATIVE
PH: 6 (ref 5.0–8.0)
Protein, ur: NEGATIVE mg/dL
SPECIFIC GRAVITY, URINE: 1.003 — AB (ref 1.005–1.030)

## 2017-04-18 LAB — I-STAT CG4 LACTIC ACID, ED
Lactic Acid, Venous: 0.8 mmol/L (ref 0.5–1.9)
Lactic Acid, Venous: 1.96 mmol/L — ABNORMAL HIGH (ref 0.5–1.9)

## 2017-04-18 SURGERY — INCISION AND DRAINAGE, ABSCESS, PERIRECTAL
Anesthesia: General

## 2017-04-18 MED ORDER — BUPIVACAINE LIPOSOME 1.3 % IJ SUSP
20.0000 mL | Freq: Once | INTRAMUSCULAR | Status: AC
  Administered 2017-04-18: 20 mL
  Filled 2017-04-18: qty 20

## 2017-04-18 MED ORDER — SODIUM CHLORIDE 0.9 % IV BOLUS (SEPSIS)
1000.0000 mL | Freq: Once | INTRAVENOUS | Status: AC
  Administered 2017-04-18: 1000 mL via INTRAVENOUS

## 2017-04-18 MED ORDER — GABAPENTIN 300 MG PO CAPS: 300.0000 mg | ORAL_CAPSULE | ORAL | Status: DC

## 2017-04-18 MED ORDER — ONDANSETRON HCL 4 MG/2ML IJ SOLN
INTRAMUSCULAR | Status: DC | PRN
  Administered 2017-04-18: 4 mg via INTRAVENOUS

## 2017-04-18 MED ORDER — SODIUM CHLORIDE 0.9 % IR SOLN
Status: DC | PRN
  Administered 2017-04-18: 1000 mL

## 2017-04-18 MED ORDER — CHLORHEXIDINE GLUCONATE CLOTH 2 % EX PADS: 6.0000 | MEDICATED_PAD | Freq: Once | CUTANEOUS | Status: DC

## 2017-04-18 MED ORDER — ACETAMINOPHEN 500 MG PO TABS: 1000.0000 mg | ORAL_TABLET | ORAL | Status: DC

## 2017-04-18 MED ORDER — MORPHINE SULFATE (PF) 2 MG/ML IV SOLN
4.0000 mg | Freq: Once | INTRAVENOUS | Status: AC
  Administered 2017-04-18: 4 mg via INTRAVENOUS
  Filled 2017-04-18: qty 2

## 2017-04-18 MED ORDER — MIDAZOLAM HCL 2 MG/2ML IJ SOLN
INTRAMUSCULAR | Status: AC
  Filled 2017-04-18: qty 2

## 2017-04-18 MED ORDER — OXYCODONE HCL 5 MG PO TABS: 5.0000 mg | ORAL_TABLET | Freq: Once | ORAL | Status: DC | PRN

## 2017-04-18 MED ORDER — CLINDAMYCIN PHOSPHATE 600 MG/50ML IV SOLN
600.0000 mg | Freq: Once | INTRAVENOUS | Status: AC
  Administered 2017-04-18: 600 mg via INTRAVENOUS
  Filled 2017-04-18: qty 50

## 2017-04-18 MED ORDER — LIDOCAINE 2% (20 MG/ML) 5 ML SYRINGE
INTRAMUSCULAR | Status: DC | PRN
  Administered 2017-04-18: 100 mg via INTRAVENOUS

## 2017-04-18 MED ORDER — PROPOFOL 10 MG/ML IV BOLUS
INTRAVENOUS | Status: DC | PRN
  Administered 2017-04-18: 200 mg via INTRAVENOUS

## 2017-04-18 MED ORDER — BUPIVACAINE HCL (PF) 0.25 % IJ SOLN
INTRAMUSCULAR | Status: AC
  Filled 2017-04-18: qty 30

## 2017-04-18 MED ORDER — FENTANYL CITRATE (PF) 100 MCG/2ML IJ SOLN
INTRAMUSCULAR | Status: AC
  Filled 2017-04-18: qty 2

## 2017-04-18 MED ORDER — DEXTROSE 5 % IV SOLN
2.0000 g | INTRAVENOUS | Status: AC
  Administered 2017-04-18: 2 g via INTRAVENOUS

## 2017-04-18 MED ORDER — IOPAMIDOL (ISOVUE-300) INJECTION 61%
INTRAVENOUS | Status: AC
  Filled 2017-04-18: qty 100

## 2017-04-18 MED ORDER — PROPOFOL 10 MG/ML IV BOLUS
INTRAVENOUS | Status: AC
  Filled 2017-04-18: qty 20

## 2017-04-18 MED ORDER — DEXTROSE 5 % IV SOLN
INTRAVENOUS | Status: AC
  Filled 2017-04-18: qty 2

## 2017-04-18 MED ORDER — CEFOTETAN DISODIUM-DEXTROSE 2-2.08 GM-% IV SOLR: 2.0000 g | INTRAVENOUS | Status: DC

## 2017-04-18 MED ORDER — AMOXICILLIN-POT CLAVULANATE 875-125 MG PO TABS: 1.0000 | ORAL_TABLET | Freq: Two times a day (BID) | ORAL | 0 refills | Status: AC

## 2017-04-18 MED ORDER — IOPAMIDOL (ISOVUE-300) INJECTION 61%
100.0000 mL | Freq: Once | INTRAVENOUS | Status: AC | PRN
  Administered 2017-04-18: 100 mL via INTRAVENOUS

## 2017-04-18 MED ORDER — PROMETHAZINE HCL 25 MG/ML IJ SOLN: 6.2500 mg | INTRAMUSCULAR | Status: DC | PRN

## 2017-04-18 MED ORDER — SODIUM CHLORIDE 0.9 % IV SOLN: INTRAVENOUS | Status: DC

## 2017-04-18 MED ORDER — SODIUM CHLORIDE 0.9 % IV SOLN
INTRAVENOUS | Status: DC | PRN
  Administered 2017-04-18 (×2): via INTRAVENOUS

## 2017-04-18 MED ORDER — MIDAZOLAM HCL 5 MG/5ML IJ SOLN
INTRAMUSCULAR | Status: DC | PRN
  Administered 2017-04-18: 2 mg via INTRAVENOUS

## 2017-04-18 MED ORDER — LIDOCAINE 2% (20 MG/ML) 5 ML SYRINGE
INTRAMUSCULAR | Status: AC
  Filled 2017-04-18: qty 5

## 2017-04-18 MED ORDER — ONDANSETRON HCL 4 MG/2ML IJ SOLN
INTRAMUSCULAR | Status: AC
  Filled 2017-04-18: qty 2

## 2017-04-18 MED ORDER — OXYCODONE HCL 5 MG PO TABS: 5.0000 mg | ORAL_TABLET | ORAL | 0 refills | Status: DC | PRN

## 2017-04-18 MED ORDER — BUPIVACAINE HCL (PF) 0.25 % IJ SOLN
INTRAMUSCULAR | Status: DC | PRN
  Administered 2017-04-18: 25 mL

## 2017-04-18 MED ORDER — HYDROMORPHONE HCL-NACL 0.5-0.9 MG/ML-% IV SOSY: 0.2500 mg | PREFILLED_SYRINGE | INTRAVENOUS | Status: DC | PRN

## 2017-04-18 MED ORDER — OXYCODONE HCL 5 MG/5ML PO SOLN: 5.0000 mg | Freq: Once | ORAL | Status: DC | PRN

## 2017-04-18 MED ORDER — FENTANYL CITRATE (PF) 100 MCG/2ML IJ SOLN
INTRAMUSCULAR | Status: DC | PRN
  Administered 2017-04-18: 50 ug via INTRAVENOUS
  Administered 2017-04-18: 100 ug via INTRAVENOUS
  Administered 2017-04-18: 50 ug via INTRAVENOUS

## 2017-04-18 SURGICAL SUPPLY — 25 items
BLADE HEX COATED 2.75 (ELECTRODE) ×3 IMPLANT
BLADE SURG 15 STRL LF DISP TIS (BLADE) ×1 IMPLANT
BLADE SURG 15 STRL SS (BLADE) ×2
COVER SURGICAL LIGHT HANDLE (MISCELLANEOUS) ×3 IMPLANT
ELECT PENCIL ROCKER SW 15FT (MISCELLANEOUS) ×3 IMPLANT
ELECT REM PT RETURN 15FT ADLT (MISCELLANEOUS) ×3 IMPLANT
GAUZE PACKING IODOFORM 1/4X15 (GAUZE/BANDAGES/DRESSINGS) ×3 IMPLANT
GAUZE SPONGE 4X4 12PLY STRL (GAUZE/BANDAGES/DRESSINGS) ×3 IMPLANT
GLOVE BIOGEL PI IND STRL 7.0 (GLOVE) ×1 IMPLANT
GLOVE BIOGEL PI IND STRL 7.5 (GLOVE) ×1 IMPLANT
GLOVE BIOGEL PI INDICATOR 7.0 (GLOVE) ×2
GLOVE BIOGEL PI INDICATOR 7.5 (GLOVE) ×2
GOWN STRL REUS W/TWL LRG LVL3 (GOWN DISPOSABLE) ×3 IMPLANT
GOWN STRL REUS W/TWL XL LVL3 (GOWN DISPOSABLE) ×3 IMPLANT
KIT BASIN OR (CUSTOM PROCEDURE TRAY) ×3 IMPLANT
LOOP VESSEL MAXI BLUE (MISCELLANEOUS) ×3 IMPLANT
LUBRICANT JELLY K Y 4OZ (MISCELLANEOUS) IMPLANT
PACK LITHOTOMY IV (CUSTOM PROCEDURE TRAY) ×3 IMPLANT
PAD ABD 8X10 STRL (GAUZE/BANDAGES/DRESSINGS) ×3 IMPLANT
SOL PREP PROV IODINE SCRUB 4OZ (MISCELLANEOUS) ×3 IMPLANT
SWAB COLLECTION DEVICE MRSA (MISCELLANEOUS) ×3 IMPLANT
SYR CONTROL 10ML LL (SYRINGE) ×3 IMPLANT
TOWEL OR 17X26 10 PK STRL BLUE (TOWEL DISPOSABLE) ×3 IMPLANT
UNDERPAD 30X30 (UNDERPADS AND DIAPERS) ×3 IMPLANT
YANKAUER SUCT BULB TIP 10FT TU (MISCELLANEOUS) ×3 IMPLANT

## 2017-04-18 NOTE — Discharge Instructions (Signed)
[facility]  RECTAL SURGERY POST OP INSTRUCTIONS: POST OP INSTRUCTIONS  Always review your discharge instruction sheet given to you by the facility where your surgery was performed. IF YOU HAVE DISABILITY OR FAMILY LEAVE FORMS, YOU MUST BRING THEM TO THE OFFICE FOR PROCESSING.   DO NOT GIVE THEM TO YOUR DOCTOR.  1. A  prescription for pain medication may be given to you upon discharge.  Take your pain medication as prescribed, if needed.  If narcotic pain medicine is not needed, then you may take acetaminophen (Tylenol) or ibuprofen (Advil) as needed. 2. Take your usually prescribed medications unless otherwise directed. 3. If you need a refill on your pain medication, please contact your pharmacy.  They will contact our office to request authorization. Prescriptions will not be filled after 5 pm or on week-ends. 4. You should follow a light diet the first 48 hours after arrival home, such as soup and crackers, etc.  Be sure to include lots of fluids daily.  Resume your normal diet 2-3 days after surgery.. 5. Most patients will experience some swelling and discomfort in the rectal area. Ice packs, reclining and warm tub soaks will help.  Swelling and discomfort can take several days to resolve.  6. It is common to experience some constipation if taking pain medication after surgery.  Increasing fluid intake and taking a stool softener (such as Colace) will usually help or prevent this problem from occurring.  A mild laxative (Milk of Magnesia or Miralax) should be taken according to package directions if there are no bowel movements after 48 hours. 7. Unless discharge instructions indicate otherwise, leave your bandage dry and in place for 24 hours, or remove the bandage if you have a bowel movement. You may notice a small amount of bleeding with bowel movements for the first few days. You may have some packing in the rectum which will come out over the first day or two. You  will need to wear an absorbent pad or soft cotton gauze in your underwear until the drainage stops.it. 8. ACTIVITIES:  You may resume regular (light) daily activities beginning the next day--such as daily self-care, walking, climbing stairs--gradually increasing activities as tolerated.  You may have sexual intercourse when it is comfortable.  Refrain from any heavy lifting or straining until approved by your doctor. a. You may drive when you are no longer taking prescription pain medication, you can comfortably wear a seatbelt, and you can safely maneuver your car and apply brakes. b. RETURN TO WORK: : ___As tolerated _________________ c.  9. You should see your doctor in the office for a follow-up appointment approximately 2-3 weeks after your surgery.  Make sure that you call for this appointment within a day or two after you arrive home to insure a convenient appointment time. 10. OTHER INSTRUCTIONS:  ______________________________ remove gauze packing on Friday after soaking in a time ____________________________________________________________________________________________________________________________________________________________  WHEN TO CALL YOUR DOCTOR: 1. Fever over 101.0 2. Inability to urinate 3. Nausea and/or vomiting 4. Extreme swelling or bruising 5. Continued bleeding from rectum. 6. Increased pain, redness, or drainage from the incision 7. Constipation  The clinic staff is available to answer your questions during regular business hours.  Please dont hesitate to call and ask to speak to one of the nurses for clinical concerns.  If you have a medical emergency, go to the nearest emergency room or call 911.  A surgeon from [facility] is always on call at the  hospital   [address], [place], [place], [place]  [address] ?  [address], [place], [place]   [address] [phone] ? [phone] ? FAX [phone] Web site:  [email or web address] General Anesthesia, Adult, Care After These instructions provide you with information about caring for yourself after your procedure. Your health care provider may also give you more specific instructions. Your treatment has been planned according to current medical practices, but problems sometimes occur. Call your health care provider if you have any problems or questions after your procedure. What can I expect after the procedure? After the procedure, it is common to have:  Vomiting.  A sore throat.  Mental slowness.  It is common to feel:  Nauseous.  Cold or shivery.  Sleepy.  Tired.  Sore or achy, even in parts of your body where you did not have surgery.  Follow these instructions at home: For at least 24 hours after the procedure:  Do not: ? Participate in activities where you could fall or become injured. ? Drive. ? Use heavy machinery. ? Drink alcohol. ? Take sleeping pills or medicines that cause drowsiness. ? Make important decisions or sign legal documents. ? Take care of children on your own.  Rest. Eating and drinking  If you vomit, drink water, juice, or soup when you can drink without vomiting.  Drink enough fluid to keep your urine clear or pale yellow.  Make sure you have little or no nausea before eating solid foods.  Follow the diet recommended by your health care provider. General instructions  Have a responsible adult stay with you until you are awake and alert.  Return to your normal activities as told by your health care provider. Ask your health care provider what activities are safe for you.  Take over-the-counter and prescription medicines only as told by your health care provider.  If you smoke, do not smoke without supervision.  Keep all follow-up visits as told by your health care provider. This is important. Contact a health care provider if:  You continue to have nausea or vomiting at home, and  medicines are not helpful.  You cannot drink fluids or start eating again.  You cannot urinate after 8-12 hours.  You develop a skin rash.  You have fever.  You have increasing redness at the site of your procedure. Get help right away if:  You have difficulty breathing.  You have chest pain.  You have unexpected bleeding.  You feel that you are having a life-threatening or urgent problem. This information is not intended to replace advice given to you by your health care provider. Make sure you discuss any questions you have with your health care provider. Document Released: 12/19/2000 Document Revised: 02/15/2016 Document Reviewed: 08/27/2015 Elsevier Interactive Patient Education  [employer].

## 2017-04-18 NOTE — Op Note (Signed)
Preoperative Diagnosis: Perirectal abscess and fistula  Postoprative Diagnosis: Same Procedure: Procedure(s): Incision and drainage PERIRECTAL ABSCESS, placement of seton, sigmoidoscopy   Surgeon: Excell Seltzer T   Assistants: None  Anesthesia:  General endotracheal anesthesia  Indications: Patient is a 56 year old female with an approximately 6-8 week history of persistent peri-anal drainage and swelling and tenderness. Previous exam has shown a probable chronic perirectal abscess and fistula. Electrosurgery was scheduled. She presents with 5 days of worsening pain and swelling and fever. She presented to the ED and CT scan shows a 4 cm perirectal abscess. I recommended exam under anesthesia and incision and drainage with possible fistulotomy or seton placement and sigmoidoscopy. The procedure and risks were discussed detailed elsewhere and she agrees to proceed.    Procedure Detail:  Patient was brought to the operating room, placed in the supine position on the operating table, and general endotracheal anesthesia induced. She received preoperative IV antibiotics. She was carefully positioned in lithotomy position and the perineum widely sterilely prepped and draped. Patient timeout was performed and correct procedure verified. Exam revealed a chronic external fistula opening in the right anterior perianal area which was draining with pressure. In the right lateral perirectal space was a fairly large, 5 cm or so area of induration and mild erythema. Anoscopy was performed and I was able to easily insert a probe from the external fistula opening through an obvious internal opening at the dentate line straight and from the external opening. This did not appear to communicate with the larger lateral abscess. I then made an incision over the area of induration more laterally and dissection was carried down to subcutaneous tissue and a deep abscess cavity entered several centimeters in diameter with  purulence and necrotic debris. This was completely broken up and evacuated and thoroughly irrigated. A sessile loop seton was placed via the probe and passed back through the fistula opening and loosely tied. This appeared to traverse at least a portion of the external sphincter. I then performed rigid sigmoidoscopy to 20 cm and the mucosa appeared normal. The abscess cavity was packed with iodoform gauze. Soft tissue was extensively infiltrated with Exparel diluted with Marcaine. There was no bleeding or other complication. Clean dry dressings were applied and patient was taken to recovery in good condition.    Findings: As above  Estimated Blood Loss:  Minimal         Drains: Wound packed with iodoform gauze and seton placed through the fistula tract  Blood Given: none          Specimens: Culture and sensitivity        Complications:  * No complications entered in OR log *         Disposition: PACU - hemodynamically stable.         Condition: stable

## 2017-04-18 NOTE — Transfer of Care (Signed)
Immediate Anesthesia Transfer of Care Note  Patient: Miranda Harris  Procedure(s) Performed: Procedure(s): IRRIGATION AND DEBRIDEMENT PERIRECTAL ABSCESS, placement of seton, sigmoidoscopy (N/A)  Patient Location: PACU  Anesthesia Type:General  Level of Consciousness: awake, alert  and oriented  Airway & Oxygen Therapy: Patient Spontanous Breathing and Patient connected to face mask oxygen  Post-op Assessment: Report given to RN and Post -op Vital signs reviewed and stable  Post vital signs: Reviewed and stable  Last Vitals:  Vitals:   04/18/17 1531 04/18/17 1857  BP: 140/85 128/83  Pulse: 83 65  Resp: 16 18  Temp: 36.8 C     Last Pain:  Vitals:   04/18/17 1946  TempSrc:   PainSc: 4          Complications: No apparent anesthesia complications

## 2017-04-18 NOTE — ED Provider Notes (Signed)
[facility] DEPT Provider Note   CSN: [id number] Arrival date & time: 04/18/17  1319     History   Chief Complaint Chief Complaint  Patient presents with  . Abscess  . Fever    HPI Miranda Harris is a 56 y.o. female.  The history is provided by the patient, medical records and the spouse.  Abscess  Location:  Pelvis Pelvic abscess location:  Perianal Abscess quality: fluctuance, induration, painful, redness and warmth   Abscess quality: not draining   Duration:  4 weeks Progression:  Worsening Pain details:    Quality:  Pressure and sharp   Severity:  Severe   Duration:  5 days   Timing:  Constant   Progression:  Worsening Chronicity:  New Relieved by:  Nothing Ineffective treatments:  Oral antibiotics Associated symptoms: fever   Associated symptoms: no fatigue, no nausea and no vomiting     Past Medical History:  Diagnosis Date  . Bipolar disorder ([place])   . Elevated serum creatinine   . Helicobacter pylori (H. pylori) infection   . Hypothyroidism   . Thyroid disease   . Trochanteric bursitis of right hip     Patient Active Problem List   Diagnosis Date Noted  . Bipolar I disorder, most recent episode depressed ([place]) 06/22/2016  . Bursitis, trochanteric 04/19/2015  . Affective bipolar disorder ([place]) 06/25/2014  . Arthralgia of hip 06/25/2014  . Adult hypothyroidism 06/25/2014    Past Surgical History:  Procedure Laterality Date  . ABDOMINAL HYSTERECTOMY    . HERNIA REPAIR    . LAPAROSCOPIC APPENDECTOMY    . WISDOM TOOTH EXTRACTION      OB History    No data available       Home Medications    Prior to Admission medications   Medication Sig Start Date End Date Taking? Authorizing Provider  Calcium Carbonate-Vitamin D (CALTRATE 600+D PO) Take 2 tablets by mouth daily.    [provider]  cyanocobalamin 500 MCG tablet Take 500 mcg by mouth daily.    [provider]  levothyroxine (SYNTHROID, LEVOTHROID) 137 MCG tablet  Take 137 mcg by mouth daily before breakfast.    [provider]  lithium 600 MG capsule Take 1 capsule (600 mg total) by mouth 2 (two) times daily with a meal. 02/17/17   [name], MD  Multiple Vitamin (MULTIVITAMIN WITH MINERALS) TABS tablet Take 1 tablet by mouth daily.    [provider]  Omega-3 Fatty Acids (FISH OIL) 1000 MG CAPS Take 2,000 mg by mouth daily.    [provider]  QUERCETIN PO Take 1 capsule by mouth 2 (two) times daily.    [provider]  QUEtiapine (SEROQUEL) 100 MG tablet Take 1.5 tablets (150 mg total) by mouth at bedtime. Patient taking differently: Take 100 mg by mouth at bedtime.  02/17/17 02/17/18  [name], MD  sulfamethoxazole-trimethoprim (BACTRIM DS,SEPTRA DS) 800-160 MG tablet Take 1 tablet by mouth 2 (two) times daily. 04/11/17   [provider]  venlafaxine XR (EFFEXOR-XR) 150 MG 24 hr capsule Take 1 capsule (150 mg total) by mouth daily with breakfast. Patient taking differently: Take 300 mg by mouth daily with breakfast.  02/17/17   [name], MD    Family History Family History  Problem Relation Age of Onset  . Anxiety disorder Brother     Social History Social History  Substance Use Topics  . Smoking status: Former Research scientist (life sciences)  . Smokeless tobacco: Never Used  . Alcohol use No  Comment: Occasional use     Allergies   Patient has no known allergies.   Review of Systems Review of Systems  Constitutional: Positive for chills and fever. Negative for diaphoresis and fatigue.  HENT: Negative for congestion and sneezing.   Respiratory: Negative for chest tightness, shortness of breath, wheezing and stridor.   Gastrointestinal: Positive for diarrhea and rectal pain. Negative for abdominal pain, constipation, nausea and vomiting.  Genitourinary: Negative for dysuria and flank pain.  Musculoskeletal: Negative for back pain, neck pain and neck stiffness.  Skin: Positive for rash. Negative for  wound.  Psychiatric/Behavioral: Negative for agitation.  All other systems reviewed and are negative.    Physical Exam Updated Vital Signs BP 140/85 (BP Location: Left Arm)   Pulse 83   Temp 98.2 F (36.8 C) (Oral)   Resp 16   Ht 5\' 9"  (1.753 m)   Wt 99.8 kg (220 lb)   SpO2 100%   BMI 32.49 kg/m   Physical Exam  Constitutional: She is oriented to person, place, and time. She appears well-developed and well-nourished. No distress.  HENT:  Head: Normocephalic and atraumatic.  Mouth/Throat: Oropharynx is clear and moist. No oropharyngeal exudate.  Eyes: Pupils are equal, round, and reactive to light. Conjunctivae and EOM are normal.  Neck: Neck supple.  Cardiovascular: Normal rate and regular rhythm.   No murmur heard. Pulmonary/Chest: Effort normal and breath sounds normal. No stridor. No respiratory distress.  Abdominal: Soft. There is no tenderness.  Genitourinary: Rectal exam shows external hemorrhoid and tenderness.     Musculoskeletal: She exhibits tenderness. She exhibits no edema.  Neurological: She is alert and oriented to person, place, and time. No sensory deficit. She exhibits normal muscle tone.  Skin: Skin is warm and dry. Capillary refill takes less than 2 seconds. There is erythema.  Psychiatric: She has a normal mood and affect.  Nursing note and vitals reviewed.    ED Treatments / Results  Labs (all labs ordered are listed, but only abnormal results are displayed) Labs Reviewed  COMPREHENSIVE METABOLIC PANEL - Abnormal; Notable for the following:       Result Value   CO2 21 (*)    BUN 23 (*)    Creatinine, Ser 1.43 (*)    GFR calc non Af Amer 40 (*)    GFR calc Af Amer 46 (*)    All other components within normal limits  URINALYSIS, ROUTINE W REFLEX MICROSCOPIC - Abnormal; Notable for the following:    Color, Urine COLORLESS (*)    Specific Gravity, Urine 1.003 (*)    Leukocytes, UA TRACE (*)    Bacteria, UA RARE (*)    Squamous Epithelial /  LPF 0-5 (*)    All other components within normal limits  I-STAT CG4 LACTIC ACID, ED - Abnormal; Notable for the following:    Lactic Acid, Venous 1.96 (*)    All other components within normal limits  CULTURE, BLOOD (ROUTINE X 2)  CULTURE, BLOOD (ROUTINE X 2)  AEROBIC/ANAEROBIC CULTURE (SURGICAL/DEEP WOUND)  CBC WITH DIFFERENTIAL/PLATELET  I-STAT CG4 LACTIC ACID, ED    EKG  EKG Interpretation None       Radiology Ct Abdomen Pelvis W Contrast  Result Date: 04/18/2017 CLINICAL DATA:  Anal abscess with new fever EXAM: CT ABDOMEN AND PELVIS WITH CONTRAST TECHNIQUE: Multidetector CT imaging of the abdomen and pelvis was performed using the standard protocol following bolus administration of intravenous contrast. CONTRAST:  144mL ISOVUE-300 IOPAMIDOL (ISOVUE-300) INJECTION 61% COMPARISON:  03/16/2017  FINDINGS: Lower chest: Lung bases show no acute consolidation or pleural effusion. Normal heart size Hepatobiliary: No focal liver abnormality is seen. No gallstones, gallbladder wall thickening, or biliary dilatation. Pancreas: Unremarkable. No pancreatic ductal dilatation or surrounding inflammatory changes. Spleen: Normal in size without focal abnormality. Adrenals/Urinary Tract: Adrenal glands are within normal limits. subcentimeter hypodensities within the kidneys too small to further characterize. 13 mm cyst upper pole left kidney. Bladder normal. Stomach/Bowel: Stomach is within normal limits. Status post appendectomy. No evidence of bowel wall thickening, distention, or inflammatory changes. Perirectal soft tissue thickening. Few colon diverticula without acute inflammation Vascular/Lymphatic: Non aneurysmal aorta. No significantly enlarged abdominal or pelvic lymph nodes. Prominent right inguinal lymph node measuring 13 mm. Reproductive: Status post hysterectomy. No adnexal masses. Other: No free air. Since the prior study, increased edema and soft tissue thickening in the right perirectal  region. Increased right posterior perianal fluid and gas collection, now measuring 3.9 x 1.7 by 3.3 cm. Musculoskeletal: Degenerative changes. No acute or suspicious bone lesion. IMPRESSION: 1. Increased soft tissue thickening and inflammation in the right perirectal/perianal soft tissues. Increased right perianal gas and fluid collection, now measuring 3.9 cm in maximum diameter, consistent with perianal abscess. 2. No acute intra-abdominal or pelvic abnormalities are seen Electronically Signed   By: Donavan Foil M.D.   On: 04/18/2017 18:52    Procedures Procedures (including critical care time)  Medications Ordered in ED Medications  iopamidol (ISOVUE-300) 61 % injection (not administered)  0.9 %  sodium chloride infusion (not administered)  oxyCODONE (Oxy IR/ROXICODONE) immediate release tablet 5 mg (not administered)    Or  oxyCODONE (ROXICODONE) 5 MG/5ML solution 5 mg (not administered)  HYDROmorphone (DILAUDID) injection 0.25-0.5 mg (not administered)  promethazine (PHENERGAN) injection 6.25-12.5 mg (not administered)  Chlorhexidine Gluconate Cloth 2 % PADS 6 each (not administered)    And  Chlorhexidine Gluconate Cloth 2 % PADS 6 each (not administered)  acetaminophen (TYLENOL) tablet 1,000 mg (not administered)  cefoTEtan in Dextrose 5% (CEFOTAN) IVPB 2 g (not administered)  gabapentin (NEURONTIN) capsule 300 mg (not administered)  morphine 2 MG/ML injection 4 mg (4 mg Intravenous Given 04/18/17 1805)  sodium chloride 0.9 % bolus 1,000 mL (0 mLs Intravenous Stopped 04/18/17 1945)  iopamidol (ISOVUE-300) 61 % injection 100 mL (100 mLs Intravenous Contrast Given 04/18/17 1820)  clindamycin (CLEOCIN) IVPB 600 mg (600 mg Intravenous New Bag/Given 04/18/17 1945)  cefTRIAXone (ROCEPHIN) 2 g in dextrose 5 % 50 mL IVPB (2 g Intravenous New Bag/Given 04/18/17 2148)  dextrose 5 % with cefTRIAXone (ROCEPHIN) ADS Med (  Override pull for Anesthesia 04/18/17 2148)  bupivacaine liposome (EXPAREL) 1.3  % injection 266 mg (20 mLs Infiltration Given 04/18/17 2236)     Initial Impression / Assessment and Plan / ED Course  I have reviewed the triage vital signs and the nursing notes.  Pertinent labs & imaging results that were available during my care of the patient were reviewed by me and considered in my medical decision making (see chart for details).     Mckennah Kretchmer is a 56 y.o. female with a past medical history significant for bipolar disorder and recent perirectal abscess with fistulization currently on antibiotics scheduled for surgical exploration and drainage in 7 days who presents with worsening swelling, worsening pain and fevers. Patient reports that for the last several weeks, she has had pain and swelling in her right rectal area. Patient had imaging last month showing concern for pararectal abscess. At that time, patient was placed  on antibiotics. Patient has been on Bactrim for the last few weeks. She says that it was initially draining on its own through her rectum and they were concerned about fistula however, with it persisting despite antibiotics, a decision was made by the general surgery team with Dr. Lucia Gaskins at [facility] to schedule surgical exploration under anesthesia with fistulotomy and incision and drainage on 04/25/17. Patient says that over the last 5 days, it stopped draining and began growing with more pain. Patient also developed fevers up to 101 for the last 5 days. Patient taking Tylenol for her fevers and discomfort but due to worsening she presents for evaluation.  Patient reports persistent diarrhea with no rectal bleeding the last 5 days. She reports no nausea or vomiting but has general malaise and intermittent lightheadedness. She denies any constipation. She denies any other abdominal pain, or urinary symptoms. She denies any chest pain, shortness of breath, or other symptoms. Next  She describes her pain as 7 out of 10 in severity and radiates near groin.  On  exam with a chaperone, patient had external hemorrhoids that were not thrombosed. Patient had severe tenderness and swelling in the right perianal area. Patient had digital rectal exam performed however due to significant pain on that side, exam was stopped. Patient had no other abdominal tenderness and no evidence of purulence. No CVA tenderness. Lungs clear.  Due to concern for worsening abscess with the stop of drainage, patient will have laboratory testing and imaging ordered to evaluate. Patient was given pain medicine and fluids. Patient made nothing by mouth.  Anticipate speaking with general surgery to determine disposition as she was scheduled for procedure this week.   Diagnostic testing results show rising lactic acid. Creatinine also slightly elevated. No leukocytosis.  CT imaging showed concern for worsening perianal abscess with air present.    General surgery was called and will evaluate patient. After their evaluation, they will but the patient to take to the OR for surgical exploration and drainage. Patient was given IV antibiotics and cultures were obtained. Patient admitted in stable condition for further management.   Final Clinical Impressions(s) / ED Diagnoses   Final diagnoses:  Abscess    Clinical Impression: 1. Abscess     Disposition: Admit to General Surgery    Miranda Harris, Gwenyth Allegra, MD 04/19/17 1110

## 2017-04-18 NOTE — Anesthesia Procedure Notes (Signed)
Performed by: Noralyn Pick D

## 2017-04-18 NOTE — Anesthesia Procedure Notes (Signed)
Procedure Name: LMA Insertion Date/Time: 04/18/2017 9:47 PM Performed by: Noralyn Pick D Pre-anesthesia Checklist: Patient identified, Emergency Drugs available, Suction available and Patient being monitored Patient Re-evaluated:Patient Re-evaluated prior to induction Oxygen Delivery Method: Circle system utilized Preoxygenation: Pre-oxygenation with 100% oxygen Induction Type: IV induction Ventilation: Mask ventilation without difficulty LMA: LMA with gastric port inserted LMA Size: 4.0 Tube type: Oral Number of attempts: 1 Placement Confirmation: ETT inserted through vocal cords under direct vision,  positive ETCO2 and breath sounds checked- equal and bilateral Tube secured with: Tape Dental Injury: Teeth and Oropharynx as per pre-operative assessment

## 2017-04-18 NOTE — H&P (Signed)
Miranda Harris is an 56 y.o. female.    Chief Complaint: Rectal pain, drainage  HPI: Patient is a 56 year old female with no history of previous similar symptoms or any chronic GI complaints until about 8 weeks ago. She has had gradually worsening discomfort and swelling and drainage in her right perianal space. She was started on oral antibiotics by her primary several weeks ago. She was referred to our office and seen by Dr. Lucia Gaskins who felt she had a small developing abscess and fistula and incision and drainage and possible fistulotomy was scheduled for next week. About 5 days ago the area stop draining. She has had increasing pain and swelling. She has had increasing fever up as high as 101 on 1 occasion. Some malaise as well. No change and normal bowel movements. Again no previous history of anything similar.   Past Medical History:  Diagnosis Date  . Bipolar disorder ([place])   . Elevated serum creatinine   . Helicobacter pylori (H. pylori) infection   . Hypothyroidism   . Thyroid disease   . Trochanteric bursitis of right hip     Past Surgical History:  Procedure Laterality Date  . ABDOMINAL HYSTERECTOMY    . HERNIA REPAIR    . LAPAROSCOPIC APPENDECTOMY    . WISDOM TOOTH EXTRACTION      Family History  Problem Relation Age of Onset  . Anxiety disorder Brother    Social History:  reports that she has quit smoking. She has never used smokeless tobacco. She reports that she does not drink alcohol or use drugs.  Allergies: No Known Allergies  Current Facility-Administered Medications  Medication Dose Route Frequency Provider Last Rate Last Dose  . iopamidol (ISOVUE-300) 61 % injection            Current Outpatient Prescriptions  Medication Sig Dispense Refill  . Calcium Carbonate-Vitamin D (CALTRATE 600+D PO) Take 2 tablets by mouth daily.    . cyanocobalamin 500 MCG tablet Take 500 mcg by mouth daily.    [name] levothyroxine (SYNTHROID, LEVOTHROID) 137 MCG tablet Take 137 mcg by  mouth daily before breakfast.    . lithium 600 MG capsule Take 1 capsule (600 mg total) by mouth 2 (two) times daily with a meal. 180 capsule 1  . Multiple Vitamin (MULTIVITAMIN WITH MINERALS) TABS tablet Take 1 tablet by mouth daily.    . Omega-3 Fatty Acids (FISH OIL) 1000 MG CAPS Take 2,000 mg by mouth daily.    [name] QUERCETIN PO Take 1 capsule by mouth 2 (two) times daily.    . QUEtiapine (SEROQUEL) 100 MG tablet Take 1.5 tablets (150 mg total) by mouth at bedtime. 135 tablet 1  . sulfamethoxazole-trimethoprim (BACTRIM DS,SEPTRA DS) 800-160 MG tablet Take 1 tablet by mouth 2 (two) times daily.    [name] venlafaxine XR (EFFEXOR-XR) 150 MG 24 hr capsule Take 1 capsule (150 mg total) by mouth daily with breakfast. (Patient taking differently: Take 300 mg by mouth daily with breakfast. ) 90 capsule 1     Results for orders placed or performed during the hospital encounter of 04/18/17 (from the past 48 hour(s))  Comprehensive metabolic panel     Status: Abnormal   Collection Time: 04/18/17  1:29 PM  Result Value Ref Range   Sodium 136 135 - 145 mmol/L   Potassium 4.5 3.5 - 5.1 mmol/L   Chloride 106 101 - 111 mmol/L   CO2 21 (L) 22 - 32 mmol/L   Glucose, Bld 94 65 - 99 mg/dL  BUN 23 (H) 6 - 20 mg/dL   Creatinine, Ser 1.43 (H) 0.44 - 1.00 mg/dL   Calcium 10.0 8.9 - 10.3 mg/dL   Total Protein 7.7 6.5 - 8.1 g/dL   Albumin 4.1 3.5 - 5.0 g/dL   AST 23 15 - 41 U/L   ALT 24 14 - 54 U/L   Alkaline Phosphatase 85 38 - 126 U/L   Total Bilirubin 0.5 0.3 - 1.2 mg/dL   GFR calc non Af Amer 40 (L) >60 mL/min   GFR calc Af Amer 46 (L) >60 mL/min    Comment: (NOTE) The eGFR has been calculated using the CKD EPI equation. This calculation has not been validated in all clinical situations. eGFR's persistently <60 mL/min signify possible Chronic Kidney Disease.    Anion gap 9 5 - 15  CBC with Differential     Status: None   Collection Time: 04/18/17  1:29 PM  Result Value Ref Range   WBC 10.1 4.0 - 10.5  K/uL   RBC 4.19 3.87 - 5.11 MIL/uL   Hemoglobin 12.6 12.0 - 15.0 g/dL   HCT 38.8 36.0 - 46.0 %   MCV 92.6 78.0 - 100.0 fL   MCH 30.1 26.0 - 34.0 pg   MCHC 32.5 30.0 - 36.0 g/dL   RDW 13.7 11.5 - 15.5 %   Platelets 336 150 - 400 K/uL   Neutrophils Relative % 69 %   Neutro Abs 7.0 1.7 - 7.7 K/uL   Lymphocytes Relative 20 %   Lymphs Abs 2.1 0.7 - 4.0 K/uL   Monocytes Relative 6 %   Monocytes Absolute 0.6 0.1 - 1.0 K/uL   Eosinophils Relative 4 %   Eosinophils Absolute 0.4 0.0 - 0.7 K/uL   Basophils Relative 1 %   Basophils Absolute 0.1 0.0 - 0.1 K/uL  I-Stat CG4 Lactic Acid, ED     Status: None   Collection Time: 04/18/17  1:41 PM  Result Value Ref Range   Lactic Acid, Venous 0.80 0.5 - 1.9 mmol/L  Urinalysis, Routine w reflex microscopic     Status: Abnormal   Collection Time: 04/18/17  5:02 PM  Result Value Ref Range   Color, Urine COLORLESS (A) YELLOW   APPearance CLEAR CLEAR   Specific Gravity, Urine 1.003 (L) 1.005 - 1.030   pH 6.0 5.0 - 8.0   Glucose, UA NEGATIVE NEGATIVE mg/dL   Hgb urine dipstick NEGATIVE NEGATIVE   Bilirubin Urine NEGATIVE NEGATIVE   Ketones, ur NEGATIVE NEGATIVE mg/dL   Protein, ur NEGATIVE NEGATIVE mg/dL   Nitrite NEGATIVE NEGATIVE   Leukocytes, UA TRACE (A) NEGATIVE   RBC / HPF 0-5 0 - 5 RBC/hpf   WBC, UA 0-5 0 - 5 WBC/hpf   Bacteria, UA RARE (A) NONE SEEN   Squamous Epithelial / LPF 0-5 (A) NONE SEEN  I-Stat CG4 Lactic Acid, ED     Status: Abnormal   Collection Time: 04/18/17  5:58 PM  Result Value Ref Range   Lactic Acid, Venous 1.96 (H) 0.5 - 1.9 mmol/L   Ct Abdomen Pelvis W Contrast  Result Date: 04/18/2017 CLINICAL DATA:  Anal abscess with new fever EXAM: CT ABDOMEN AND PELVIS WITH CONTRAST TECHNIQUE: Multidetector CT imaging of the abdomen and pelvis was performed using the standard protocol following bolus administration of intravenous contrast. CONTRAST:  135m ISOVUE-300 IOPAMIDOL (ISOVUE-300) INJECTION 61% COMPARISON:  03/16/2017  FINDINGS: Lower chest: Lung bases show no acute consolidation or pleural effusion. Normal heart size Hepatobiliary: No focal liver abnormality is  seen. No gallstones, gallbladder wall thickening, or biliary dilatation. Pancreas: Unremarkable. No pancreatic ductal dilatation or surrounding inflammatory changes. Spleen: Normal in size without focal abnormality. Adrenals/Urinary Tract: Adrenal glands are within normal limits. subcentimeter hypodensities within the kidneys too small to further characterize. 13 mm cyst upper pole left kidney. Bladder normal. Stomach/Bowel: Stomach is within normal limits. Status post appendectomy. No evidence of bowel wall thickening, distention, or inflammatory changes. Perirectal soft tissue thickening. Few colon diverticula without acute inflammation Vascular/Lymphatic: Non aneurysmal aorta. No significantly enlarged abdominal or pelvic lymph nodes. Prominent right inguinal lymph node measuring 13 mm. Reproductive: Status post hysterectomy. No adnexal masses. Other: No free air. Since the prior study, increased edema and soft tissue thickening in the right perirectal region. Increased right posterior perianal fluid and gas collection, now measuring 3.9 x 1.7 by 3.3 cm. Musculoskeletal: Degenerative changes. No acute or suspicious bone lesion. IMPRESSION: 1. Increased soft tissue thickening and inflammation in the right perirectal/perianal soft tissues. Increased right perianal gas and fluid collection, now measuring 3.9 cm in maximum diameter, consistent with perianal abscess. 2. No acute intra-abdominal or pelvic abnormalities are seen Electronically Signed   By: Donavan Foil M.D.   On: 04/18/2017 18:52    Review of Systems  Constitutional: Positive for chills and fever.  Respiratory: Negative.   Cardiovascular: Negative.   Gastrointestinal: Negative.   Genitourinary: Negative.     Blood pressure 128/83, pulse 65, temperature 98.2 F (36.8 C), temperature source Oral,  resp. rate 18, height 5' 9"  (1.753 m), weight 99.8 kg (220 lb), SpO2 100 %. Physical Exam  General: Alert, moderately obese Caucasian female in no distress Skin: Warm and dry without rash or infection. HEENT: No palpable masses or thyromegaly. Sclera nonicteric. Pupils equal round and reactive. Oropharynx clear. Lungs: Breath sounds clear and equal without increased work of breathing Cardiovascular: Regular rate and rhythm without murmur. No JVD or edema. Peripheral pulses intact. Abdomen: Nondistended. Soft and nontender. No masses palpable. No organomegaly. No palpable hernias. Rectal: In the right posterior perirectal space is a 4-5 cm area of firm induration and tenderness and mild erythema. No definite fluctuance. Extremities: No edema or joint swelling or deformity. No chronic venous stasis changes. Neurologic: Alert and fully oriented. Gait normal.   Assessment/Plan Worsening perirectal abscess and probably fistula. Patient is not tolerating this at home and I do not believe can be discharged for later surgery. I recommended proceeding with incision and drainage of her perirectal abscess under general anesthesia today. We will try to identify fistula. I discussed the nature of the surgery with the patient and her husband including its indications and expected recovery. We discussed we may or may not be able to find the fistula and recurrent abscess/fistula is possible. In the face of acute abscess and inflammation if I can find a fistula opening I would plan to place a seton for marking and I discussed this with him. All questions were answered and she desires to proceed. She is receiving broad-spectrum IV antibiotics.  Edward Jolly, MD 04/18/2017, 8:50 PM

## 2017-04-18 NOTE — ED Triage Notes (Signed)
Pt verbalizes scheduled for surgery end of month related to anal abscess; abscess draining up until 5 days ago and new onset fever within past 5 days.

## 2017-04-18 NOTE — Anesthesia Preprocedure Evaluation (Signed)
Anesthesia Evaluation  Patient identified by MRN, date of birth, ID band Patient awake    Reviewed: Allergy & Precautions, NPO status , Patient's Chart, lab work & pertinent test results  Airway Mallampati: III  TM Distance: >3 FB Neck ROM: Full    Dental no notable dental hx.    Pulmonary former smoker,    Pulmonary exam normal breath sounds clear to auscultation       Cardiovascular Exercise Tolerance: Good negative cardio ROS Normal cardiovascular exam Rhythm:Regular Rate:Normal     Neuro/Psych PSYCHIATRIC DISORDERS Depression Bipolar Disorder negative neurological ROS     GI/Hepatic negative GI ROS, Neg liver ROS,   Endo/Other  Hypothyroidism   Renal/GU negative Renal ROS     Musculoskeletal negative musculoskeletal ROS (+)   Abdominal (+) + obese,   Peds  Hematology negative hematology ROS (+)   Anesthesia Other Findings   Reproductive/Obstetrics                             Anesthesia Physical Anesthesia Plan  ASA: II and emergent  Anesthesia Plan: General   Post-op Pain Management:    Induction: Intravenous  PONV Risk Score and Plan: 3 and Ondansetron, Dexamethasone, Propofol and Midazolam  Airway Management Planned: LMA  Additional Equipment:   Intra-op Plan:   Post-operative Plan: Extubation in OR  Informed Consent: I have reviewed the patients History and Physical, chart, labs and discussed the procedure including the risks, benefits and alternatives for the proposed anesthesia with the patient or authorized representative who has indicated his/her understanding and acceptance.   Dental advisory given  Plan Discussed with: CRNA  Anesthesia Plan Comments:         Anesthesia Quick Evaluation

## 2017-04-18 NOTE — Anesthesia Procedure Notes (Signed)
Procedures

## 2017-04-19 ENCOUNTER — Inpatient Hospital Stay (HOSPITAL_COMMUNITY): Admission: RE | Admit: 2017-04-19 | Payer: Self-pay | Source: Ambulatory Visit

## 2017-04-19 ENCOUNTER — Encounter (HOSPITAL_COMMUNITY): Payer: Self-pay | Admitting: General Surgery

## 2017-04-19 HISTORY — DX: Other specified abnormal findings of blood chemistry: R79.89

## 2017-04-19 HISTORY — DX: Other specified bacterial intestinal infections: A04.8

## 2017-04-19 HISTORY — DX: Bipolar disorder, unspecified: F31.9

## 2017-04-19 HISTORY — DX: Trochanteric bursitis, right hip: M70.61

## 2017-04-19 HISTORY — DX: Hypothyroidism, unspecified: E03.9

## 2017-04-20 NOTE — Anesthesia Postprocedure Evaluation (Signed)
Anesthesia Post Note  Patient: Miranda Harris  Procedure(s) Performed: Procedure(s) (LRB): IRRIGATION AND DEBRIDEMENT PERIRECTAL ABSCESS, placement of seton, sigmoidoscopy (N/A)     Patient location during evaluation: PACU Anesthesia Type: General Level of consciousness: awake and alert Pain management: pain level controlled Vital Signs Assessment: post-procedure vital signs reviewed and stable Respiratory status: spontaneous breathing, nonlabored ventilation, respiratory function stable and patient connected to nasal cannula oxygen Cardiovascular status: blood pressure returned to baseline and stable Postop Assessment: no signs of nausea or vomiting Anesthetic complications: no    Last Vitals:  Vitals:   04/18/17 2330 04/18/17 2345  BP: 135/87 135/80  Pulse: 61 60  Resp: 18 20  Temp:  36.8 C    Last Pain:  Vitals:   04/18/17 1946  TempSrc:   PainSc: 4    Pain Goal:                 Miranda Harris

## 2017-04-23 LAB — CULTURE, BLOOD (ROUTINE X 2)
CULTURE: NO GROWTH
Culture: NO GROWTH
Special Requests: ADEQUATE
Special Requests: ADEQUATE

## 2017-04-24 LAB — AEROBIC/ANAEROBIC CULTURE W GRAM STAIN (SURGICAL/DEEP WOUND)

## 2017-04-24 LAB — AEROBIC/ANAEROBIC CULTURE (SURGICAL/DEEP WOUND): CULTURE: NORMAL

## 2017-04-25 ENCOUNTER — Ambulatory Visit (HOSPITAL_COMMUNITY): Admission: RE | Admit: 2017-04-25 | Payer: Managed Care, Other (non HMO) | Source: Ambulatory Visit | Admitting: Surgery

## 2017-04-25 ENCOUNTER — Encounter (HOSPITAL_COMMUNITY): Admission: RE | Payer: Self-pay | Source: Ambulatory Visit

## 2017-04-25 SURGERY — EXAM UNDER ANESTHESIA WITH ANAL FISTULECTOMY
Anesthesia: General
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
## 2017-05-12 ENCOUNTER — Encounter (HOSPITAL_COMMUNITY): Payer: Self-pay | Admitting: Psychiatry

## 2017-05-12 ENCOUNTER — Ambulatory Visit (INDEPENDENT_AMBULATORY_CARE_PROVIDER_SITE_OTHER): Payer: 59 | Admitting: Psychiatry

## 2017-05-12 VITALS — BP 114/73 | HR 75 | Ht 69.0 in | Wt 227.0 lb

## 2017-05-12 DIAGNOSIS — E039 Hypothyroidism, unspecified: Secondary | ICD-10-CM | POA: Diagnosis not present

## 2017-05-12 DIAGNOSIS — F419 Anxiety disorder, unspecified: Secondary | ICD-10-CM

## 2017-05-12 DIAGNOSIS — K611 Rectal abscess: Secondary | ICD-10-CM

## 2017-05-12 DIAGNOSIS — M25559 Pain in unspecified hip: Secondary | ICD-10-CM | POA: Diagnosis not present

## 2017-05-12 DIAGNOSIS — Z87891 Personal history of nicotine dependence: Secondary | ICD-10-CM

## 2017-05-12 DIAGNOSIS — Z818 Family history of other mental and behavioral disorders: Secondary | ICD-10-CM | POA: Diagnosis not present

## 2017-05-12 DIAGNOSIS — Z560 Unemployment, unspecified: Secondary | ICD-10-CM | POA: Diagnosis not present

## 2017-05-12 DIAGNOSIS — F3130 Bipolar disorder, current episode depressed, mild or moderate severity, unspecified: Secondary | ICD-10-CM

## 2017-05-12 DIAGNOSIS — F1011 Alcohol abuse, in remission: Secondary | ICD-10-CM

## 2017-05-12 DIAGNOSIS — R5383 Other fatigue: Secondary | ICD-10-CM | POA: Diagnosis not present

## 2017-05-12 MED ORDER — VENLAFAXINE HCL ER 150 MG PO CP24: ORAL_CAPSULE | ORAL | 1 refills | Status: DC

## 2017-05-12 MED ORDER — LITHIUM CARBONATE 600 MG PO CAPS: 600.0000 mg | ORAL_CAPSULE | Freq: Two times a day (BID) | ORAL | 1 refills | Status: DC

## 2017-05-12 MED ORDER — VENLAFAXINE HCL ER 75 MG PO CP24: ORAL_CAPSULE | ORAL | 1 refills | Status: DC

## 2017-05-12 MED ORDER — QUETIAPINE FUMARATE 100 MG PO TABS
150.0000 mg | ORAL_TABLET | Freq: Every day | ORAL | 1 refills | Status: DC
Start: 2017-05-12 — End: 2017-05-12

## 2017-05-12 MED ORDER — QUETIAPINE FUMARATE 100 MG PO TABS: 150.0000 mg | ORAL_TABLET | Freq: Every day | ORAL | 1 refills | Status: DC

## 2017-05-12 NOTE — Patient Instructions (Signed)
1. Continue vanlafaxine 225 mg (150+75 mg) 2. Continue quetiapine 150 mg at night 3. Continue lithium 600 mg twice a day 4. Return to clinic in three months for 30 mins

## 2017-06-08 ENCOUNTER — Other Ambulatory Visit: Payer: Self-pay | Admitting: Surgery

## 2017-06-15 ENCOUNTER — Other Ambulatory Visit: Payer: Self-pay | Admitting: Surgery

## 2017-06-15 DIAGNOSIS — K603 Anal fistula: Secondary | ICD-10-CM

## 2017-06-20 ENCOUNTER — Ambulatory Visit: Payer: Self-pay | Admitting: Surgery

## 2017-06-20 NOTE — H&P (Signed)
Miranda Harris 06/20/2017 9:20 AM Location: [facility] Patient #: [id number] DOB: July 09, 1961 Married / Language: Miranda Harris / Race: White Female  History of Present Illness Miranda Hector MD; 06/20/2017 10:09 AM) The patient is a 56 year old female who presents with anal fistula. Note for "Anal fistula": ` ` ` Patient sent for surgical consultation at the request of Dr. Lucia Harris  Chief Complaint: Miranda Harris  The patient is a 56 year old female who had severe perirectal pain and found to have a perirectal abscess suspected in late June. Seen by our office in July. Felt to have a draining perirectal abscess and possible fistula. Dr. Elder Harris recommended to complete antibiotics and probable examination under anesthesia and fistula repair once the infection resolved. Unfortunately she felt worsening pain and swelling. Repeat CT scan showed worsening abscess. Therefore she underwent emergent incision and drainage 04/18/2017 by Dr Miranda Harris. Fistula diagnosed. Seton placed since it was felt to involve the external sphincter at least. Sent follow-up in the office. Miranda Harris was irritating. In trying to adjust it. Loosened and fell out. Followed up back with original surgeon Dr. Lucia Harris. Seton apparently fell out. Recommendation made for semination under anesthesia with fistula repair. She wished for a second opinion, therefore Dr. Lucia Harris sent to me.  She notes that she is having some mild blood and drainage. Moving her bowels a little more often than usual. She takes Metamucil every day and usually moves her bowels once or twice a day. She's occasionally up to 3 about once a day. More loose. Endoscopies in the past for polyps. She believes the last one was in 2016 or 17. She's been off antibiotics for a while. No personal nor family history of GI/colon cancer, inflammatory bowel disease, irritable bowel syndrome, allergy such as Celiac Sprue, dietary/dairy problems, colitis,  ulcers nor gastritis. No recent sick contacts/gastroenteritis. No travel outside the country. No changes in diet. No dysphagia to solids or liquids. No significant heartburn or reflux. No hematochezia, hematemesis, coffee ground emesis. No evidence of prior gastric/peptic ulceration.   (Review of systems as stated in this history (HPI) or in the review of systems. Otherwise all other 12 point ROS are negative)    Physical Exam Miranda Hector MD; 06/20/2017 10:10 AM)  General Mental Status-Alert. General Appearance-Not in acute distress, Not Sickly. Orientation-Oriented X3. Hydration-Well hydrated. Voice-Normal.  Integumentary Global Assessment Upon inspection and palpation of skin surfaces of the - Axillae: non-tender, no inflammation or ulceration, no drainage. and Distribution of scalp and body hair is normal. General Characteristics Temperature - normal warmth is noted.  Head and Neck Head-normocephalic, atraumatic with no lesions or palpable masses. Face Global Assessment - atraumatic, no absence of expression. Neck Global Assessment - no abnormal movements, no bruit auscultated on the right, no bruit auscultated on the left, no decreased range of motion, non-tender. Trachea-midline. Thyroid Gland Characteristics - non-tender.  Eye Eyeball - Left-Extraocular movements intact, No Nystagmus. Eyeball - Right-Extraocular movements intact, No Nystagmus. Cornea - Left-No Hazy. Cornea - Right-No Hazy. Sclera/Conjunctiva - Left-No scleral icterus, No Discharge. Sclera/Conjunctiva - Right-No scleral icterus, No Discharge. Pupil - Left-Direct reaction to light normal. Pupil - Right-Direct reaction to light normal.  ENMT Ears Pinna - Left - no drainage observed, no generalized tenderness observed. Right - no drainage observed, no generalized tenderness observed. Nose and Sinuses External Inspection of the Nose - no destructive lesion  observed. Inspection of the nares - Left - quiet respiration. Right - quiet respiration. Mouth and Throat Lips - Upper  Lip - no fissures observed, no pallor noted. Lower Lip - no fissures observed, no pallor noted. Nasopharynx - no discharge present. Oral Cavity/Oropharynx - Tongue - no dryness observed. Oral Mucosa - no cyanosis observed. Hypopharynx - no evidence of airway distress observed.  Chest and Lung Exam Inspection Movements - Normal and Symmetrical. Accessory muscles - No use of accessory muscles in breathing. Palpation Palpation of the chest reveals - Non-tender. Auscultation Breath sounds - Normal and Clear.  Cardiovascular Auscultation Rhythm - Regular. Murmurs & Other Heart Sounds - Auscultation of the heart reveals - No Murmurs and No Systolic Clicks.  Abdomen Inspection Inspection of the abdomen reveals - No Visible peristalsis and No Abnormal pulsations. Umbilicus - No Bleeding, No Urine drainage. Palpation/Percussion Palpation and Percussion of the abdomen reveal - Soft, Non Tender, No Rebound tenderness, No Rigidity (guarding) and No Cutaneous hyperesthesia. Note: Abdomen obese but soft. Nondistended. Mild discomfort left lower side but no guarding. No umbilical or other anterior abdominal wall hernias  Female Genitourinary Sexual Maturity Tanner 5 - Adult hair pattern. Note: No vaginal bleeding nor discharge  Rectal Note: Perianal skin clear. Right anterior perianal hypertrophic granulation tissue 1 cm, 2.5 cm from sphincter. Some drainage. Cord felt toward sphincter. Consistent with perirectal fistula. No abscess. No cellulitis. Mildly increased sphincter tone. No fissure. Anterior external hemorrhoid tags with mild irritation. No pruritus. NO warts. Held off on more aggressive digital or anoscopic exam given her recent prior examinations  Peripheral Vascular Upper Extremity Inspection - Left - No Cyanotic nailbeds, Not Ischemic. Right - No Cyanotic  nailbeds, Not Ischemic.  Neurologic Neurologic evaluation reveals -normal attention span and ability to concentrate, able to name objects and repeat phrases. Appropriate fund of knowledge , normal sensation and normal coordination. Mental Status Affect - not angry, not paranoid. Cranial Nerves-Normal Bilaterally. Gait-Normal.  Neuropsychiatric Mental status exam performed with findings of-able to articulate well with normal speech/language, rate, volume and coherence, thought content normal with ability to perform basic computations and apply abstract reasoning and no evidence of hallucinations, delusions, obsessions or homicidal/suicidal ideation.  Musculoskeletal Global Assessment Spine, Ribs and Pelvis - no instability, subluxation or laxity. Right Upper Extremity - no instability, subluxation or laxity.  Lymphatic Head & Neck  General Head & Neck Lymphatics: Bilateral - Description - No Localized lymphadenopathy. Axillary  General Axillary Region: Bilateral - Description - No Localized lymphadenopathy. Femoral & Inguinal  Generalized Femoral & Inguinal Lymphatics: Left - Description - No Localized lymphadenopathy. Right - Description - No Localized lymphadenopathy.    Assessment & Plan Miranda Hector MD; 06/20/2017 10:04 AM)  ANAL FISTULA (K60.3) Impression: No evidence of abscess but definite persistent chronic anal fistulous tract.  I agree that she would benefit from examination under anesthesia and fistulotomy versus repair. I suspect that this is at least partially transected enteric and will require a LIFT repair. Possible repeat seton.  I do not think she needs an MRI at this point. Anatomy seems rather straightforward. Type of intervention and repair will be based on intraoperative findings  I reassured her she has no active infection or abscess at that time. Explained the reasoning for the drainage of the abscess with seton placement. Allow the infection  and cellulitis to resolve. Allow the fistula tract to mature before proceeding with fistula repair surgery. I think that help her understand why there was the need for emergent and then delayed intervention. She expressed understanding and is interested in proceeding with surgery.  Current Plans Pt Education -  [facility] Abscess/Fistula (AT): discussed with patient and provided information. The anatomy & physiology of the anorectal region was discussed. We discussed the pathophysiology of anorectal abscess and fistula. Differential diagnosis was discussed. Natural history progression was discussed. I stressed the importance of a bowel regimen to have daily soft bowel movements to minimize progression of disease.  The patient's condition is not adequately controlled. Non-operative treatment has not healed the fistula. Therefore, I recommended examination under anaesthesia to confirm the diagnosis and treat the fistula. I discussed techniques that may be required such as fistulotomy, ligation by LIFT technique, and/or seton placement. Benefits & alternatives discussed. I noted a good likelihood this will help address the problem, but sometimes repeat operations and prolonged healing times may occur. Risks such as bleeding, pain, recurrence, reoperation, incontinence, heart attack, death, and other risks were discussed.  Educational handouts further explaining the pathology, treatment options, and bowel regimen were given. The patient expressed understanding & wishes to proceed. We will work to coordinate surgery for a mutually convenient time.  ENCOUNTER FOR PREOPERATIVE EXAMINATION FOR GENERAL SURGICAL PROCEDURE (Z01.818)  Current Plans You are being scheduled for surgery- Our schedulers will call you.  You should hear from our office's scheduling department within 5 working days about the location, date, and time of surgery. We try to make accommodations for patient's preferences in  scheduling surgery, but sometimes the OR schedule or the surgeon's schedule prevents [place] from making those accommodations.  If you have not heard from our office [phone]) in 5 working days, call the office and ask for your surgeon's nurse.  If you have other questions about your diagnosis, plan, or surgery, call the office and ask for your surgeon's nurse.  Pt Education - [facility] Rectal Prep for Anorectal outpatient/office surgery: discussed with patient and provided information. Pt Education - [facility] Rectal Surgery HCI (Ambera Fedele): discussed with patient and provided information. Pt Education - [facility] Good Bowel Health (Cherron Blitzer) EXTERNAL HEMORRHOIDS WITH COMPLICATION (Y70.6) Impression: Some irritated external hemorrhoids anteriorly. She would like to have those removed at the same time if possible. I think that is reasonable.  Current Plans The anatomy & physiology of the anorectal region was discussed. The pathophysiology of hemorrhoids and differential diagnosis was discussed. Natural history risks without surgery was discussed. I stressed the importance of a bowel regimen to have daily soft bowel movements to minimize progression of disease. Interventions such as sclerotherapy & banding were discussed.  The patient's symptoms are not adequately controlled by medicines and other non-operative treatments. I feel the risks & problems of no surgery outweigh the operative risks; therefore, I recommended surgery to treat the hemorrhoids by ligation, pexy, and possible resection.  Risks such as bleeding, infection, urinary difficulties, need for further treatment, heart attack, death, and other risks were discussed. I noted a good likelihood this will help address the problem. Goals of post-operative recovery were discussed as well. Possibility that this will not correct all symptoms was explained. Post-operative pain, bleeding, constipation, and other problems after surgery were discussed. We will  work to minimize complications. Educational handouts further explaining the pathology, treatment options, and bowel regimen were given as well. Questions were answered. The patient expresses understanding & wishes to proceed with surgery.  Miranda Harris, M.D., F.A.C.S. Gastrointestinal and Minimally Invasive Surgery [facility] [address] [place], [place] [id number] [phone] Main / Paging

## 2017-06-23 ENCOUNTER — Encounter (HOSPITAL_BASED_OUTPATIENT_CLINIC_OR_DEPARTMENT_OTHER): Payer: Self-pay | Admitting: *Deleted

## 2017-06-23 NOTE — Progress Notes (Signed)
Npo after midnight arrive 1315 needs isat 8. Take levothryoxine, lithium,  Venlafaxine, (EFFEXOR), PT HAS RECTAL PREP FROM DR GROSS OFFICE INSTRUCTIONS

## 2017-06-27 ENCOUNTER — Other Ambulatory Visit: Payer: Self-pay

## 2017-06-28 ENCOUNTER — Ambulatory Visit (HOSPITAL_BASED_OUTPATIENT_CLINIC_OR_DEPARTMENT_OTHER)
Admission: RE | Admit: 2017-06-28 | Discharge: 2017-06-28 | Disposition: A | Discharge status: Home / Self Care | Payer: Managed Care, Other (non HMO) | Source: Ambulatory Visit | Attending: Surgery | Admitting: Surgery

## 2017-06-28 ENCOUNTER — Ambulatory Visit (HOSPITAL_BASED_OUTPATIENT_CLINIC_OR_DEPARTMENT_OTHER): Payer: Managed Care, Other (non HMO) | Admitting: Anesthesiology

## 2017-06-28 ENCOUNTER — Encounter (HOSPITAL_BASED_OUTPATIENT_CLINIC_OR_DEPARTMENT_OTHER)
Admission: RE | Disposition: A | Discharge status: Home / Self Care | Payer: Self-pay | Source: Ambulatory Visit | Attending: Surgery

## 2017-06-28 ENCOUNTER — Encounter (HOSPITAL_BASED_OUTPATIENT_CLINIC_OR_DEPARTMENT_OTHER): Payer: Self-pay | Admitting: Anesthesiology

## 2017-06-28 DIAGNOSIS — Z87891 Personal history of nicotine dependence: Secondary | ICD-10-CM | POA: Diagnosis not present

## 2017-06-28 DIAGNOSIS — Z90710 Acquired absence of both cervix and uterus: Secondary | ICD-10-CM | POA: Diagnosis not present

## 2017-06-28 DIAGNOSIS — Z6832 Body mass index (BMI) 32.0-32.9, adult: Secondary | ICD-10-CM | POA: Diagnosis not present

## 2017-06-28 DIAGNOSIS — K603 Anal fistula, unspecified: Secondary | ICD-10-CM | POA: Insufficient documentation

## 2017-06-28 DIAGNOSIS — K604 Rectal fistula: Secondary | ICD-10-CM | POA: Insufficient documentation

## 2017-06-28 DIAGNOSIS — E039 Hypothyroidism, unspecified: Secondary | ICD-10-CM | POA: Diagnosis not present

## 2017-06-28 DIAGNOSIS — Z818 Family history of other mental and behavioral disorders: Secondary | ICD-10-CM | POA: Insufficient documentation

## 2017-06-28 DIAGNOSIS — K644 Residual hemorrhoidal skin tags: Secondary | ICD-10-CM | POA: Diagnosis not present

## 2017-06-28 DIAGNOSIS — K6289 Other specified diseases of anus and rectum: Secondary | ICD-10-CM | POA: Insufficient documentation

## 2017-06-28 DIAGNOSIS — Z79899 Other long term (current) drug therapy: Secondary | ICD-10-CM | POA: Insufficient documentation

## 2017-06-28 DIAGNOSIS — E669 Obesity, unspecified: Secondary | ICD-10-CM | POA: Insufficient documentation

## 2017-06-28 DIAGNOSIS — F319 Bipolar disorder, unspecified: Secondary | ICD-10-CM | POA: Diagnosis not present

## 2017-06-28 DIAGNOSIS — K614 Intrasphincteric abscess: Secondary | ICD-10-CM | POA: Insufficient documentation

## 2017-06-28 DIAGNOSIS — Z9889 Other specified postprocedural states: Secondary | ICD-10-CM | POA: Diagnosis not present

## 2017-06-28 HISTORY — PX: EVALUATION UNDER ANESTHESIA WITH HEMORRHOIDECTOMY: SHX5624

## 2017-06-28 HISTORY — DX: Rectal fistula: K60.4

## 2017-06-28 HISTORY — DX: Rectal fistula, unspecified: K60.40

## 2017-06-28 LAB — POCT I-STAT, CHEM 8
BUN: 25 mg/dL — ABNORMAL HIGH (ref 6–20)
CHLORIDE: 107 mmol/L (ref 101–111)
CREATININE: 1.2 mg/dL — AB (ref 0.44–1.00)
Calcium, Ion: 1.34 mmol/L (ref 1.15–1.40)
GLUCOSE: 95 mg/dL (ref 65–99)
HEMATOCRIT: 39 % (ref 36.0–46.0)
Hemoglobin: 13.3 g/dL (ref 12.0–15.0)
POTASSIUM: 4.5 mmol/L (ref 3.5–5.1)
Sodium: 140 mmol/L (ref 135–145)
TCO2: 27 mmol/L (ref 22–32)

## 2017-06-28 SURGERY — EXAM UNDER ANESTHESIA WITH HEMORRHOIDECTOMY
Anesthesia: General

## 2017-06-28 MED ORDER — FENTANYL CITRATE (PF) 100 MCG/2ML IJ SOLN
INTRAMUSCULAR | Status: DC | PRN
  Administered 2017-06-28 (×4): 50 ug via INTRAVENOUS

## 2017-06-28 MED ORDER — SUGAMMADEX SODIUM 200 MG/2ML IV SOLN
INTRAVENOUS | Status: AC
  Filled 2017-06-28: qty 2

## 2017-06-28 MED ORDER — MIDAZOLAM HCL 2 MG/2ML IJ SOLN
INTRAMUSCULAR | Status: DC | PRN
  Administered 2017-06-28: 2 mg via INTRAVENOUS

## 2017-06-28 MED ORDER — CHLORHEXIDINE GLUCONATE CLOTH 2 % EX PADS
6.0000 | MEDICATED_PAD | Freq: Once | CUTANEOUS | Status: DC
  Filled 2017-06-28: qty 6

## 2017-06-28 MED ORDER — BUPIVACAINE LIPOSOME 1.3 % IJ SUSP
20.0000 mL | INTRAMUSCULAR | Status: DC
  Filled 2017-06-28: qty 20

## 2017-06-28 MED ORDER — CEFAZOLIN SODIUM-DEXTROSE 2-4 GM/100ML-% IV SOLN
INTRAVENOUS | Status: AC
  Filled 2017-06-28: qty 100

## 2017-06-28 MED ORDER — PROPOFOL 10 MG/ML IV BOLUS
INTRAVENOUS | Status: AC
  Filled 2017-06-28: qty 40

## 2017-06-28 MED ORDER — LIDOCAINE 2% (20 MG/ML) 5 ML SYRINGE
INTRAMUSCULAR | Status: AC
  Filled 2017-06-28: qty 5

## 2017-06-28 MED ORDER — FENTANYL CITRATE (PF) 100 MCG/2ML IJ SOLN
25.0000 ug | INTRAMUSCULAR | Status: DC | PRN
  Filled 2017-06-28: qty 1

## 2017-06-28 MED ORDER — CEFAZOLIN SODIUM-DEXTROSE 2-4 GM/100ML-% IV SOLN
2.0000 g | INTRAVENOUS | Status: AC
  Administered 2017-06-28: 2 g via INTRAVENOUS
  Filled 2017-06-28: qty 100

## 2017-06-28 MED ORDER — EPHEDRINE SULFATE-NACL 50-0.9 MG/10ML-% IV SOSY
PREFILLED_SYRINGE | INTRAVENOUS | Status: DC | PRN
  Administered 2017-06-28 (×5): 10 mg via INTRAVENOUS

## 2017-06-28 MED ORDER — LACTATED RINGERS IV SOLN
INTRAVENOUS | Status: DC
  Administered 2017-06-28 (×2): via INTRAVENOUS
  Filled 2017-06-28: qty 1000

## 2017-06-28 MED ORDER — LIDOCAINE HCL 4 % MT SOLN
OROMUCOSAL | Status: DC | PRN
  Administered 2017-06-28: 2 mL via TOPICAL

## 2017-06-28 MED ORDER — GABAPENTIN 300 MG PO CAPS
300.0000 mg | ORAL_CAPSULE | ORAL | Status: AC
  Administered 2017-06-28: 300 mg via ORAL
  Filled 2017-06-28: qty 1

## 2017-06-28 MED ORDER — ONDANSETRON HCL 4 MG/2ML IJ SOLN
INTRAMUSCULAR | Status: DC | PRN
  Administered 2017-06-28: 4 mg via INTRAVENOUS

## 2017-06-28 MED ORDER — GLYCOPYRROLATE 0.2 MG/ML IV SOSY
PREFILLED_SYRINGE | INTRAVENOUS | Status: AC
  Filled 2017-06-28: qty 5

## 2017-06-28 MED ORDER — OXYCODONE HCL 5 MG PO TABS: 5.0000 mg | ORAL_TABLET | ORAL | 0 refills | Status: DC | PRN

## 2017-06-28 MED ORDER — CELECOXIB 200 MG PO CAPS
ORAL_CAPSULE | ORAL | Status: AC
  Filled 2017-06-28: qty 1

## 2017-06-28 MED ORDER — OXYCODONE HCL 5 MG PO TABS
ORAL_TABLET | ORAL | Status: AC
  Filled 2017-06-28: qty 1

## 2017-06-28 MED ORDER — FENTANYL CITRATE (PF) 100 MCG/2ML IJ SOLN
INTRAMUSCULAR | Status: AC
  Filled 2017-06-28: qty 2

## 2017-06-28 MED ORDER — BUPIVACAINE LIPOSOME 1.3 % IJ SUSP
INTRAMUSCULAR | Status: DC | PRN
  Administered 2017-06-28: 20 mL

## 2017-06-28 MED ORDER — PROMETHAZINE HCL 25 MG/ML IJ SOLN
6.2500 mg | INTRAMUSCULAR | Status: DC | PRN
  Filled 2017-06-28: qty 1

## 2017-06-28 MED ORDER — DEXAMETHASONE SODIUM PHOSPHATE 10 MG/ML IJ SOLN
INTRAMUSCULAR | Status: AC
  Filled 2017-06-28: qty 1

## 2017-06-28 MED ORDER — EPHEDRINE 5 MG/ML INJ
INTRAVENOUS | Status: AC
  Filled 2017-06-28: qty 10

## 2017-06-28 MED ORDER — OXYCODONE HCL 5 MG PO TABS
5.0000 mg | ORAL_TABLET | ORAL | Status: DC | PRN
  Administered 2017-06-28: 5 mg via ORAL
  Filled 2017-06-28: qty 1

## 2017-06-28 MED ORDER — GABAPENTIN 300 MG PO CAPS
ORAL_CAPSULE | ORAL | Status: AC
  Filled 2017-06-28: qty 1

## 2017-06-28 MED ORDER — ACETAMINOPHEN 500 MG PO TABS
1000.0000 mg | ORAL_TABLET | ORAL | Status: AC
  Administered 2017-06-28: 1000 mg via ORAL
  Filled 2017-06-28: qty 2

## 2017-06-28 MED ORDER — ACETAMINOPHEN 500 MG PO TABS
ORAL_TABLET | ORAL | Status: AC
  Filled 2017-06-28: qty 2

## 2017-06-28 MED ORDER — SUGAMMADEX SODIUM 200 MG/2ML IV SOLN
INTRAVENOUS | Status: DC | PRN
  Administered 2017-06-28: 200 mg via INTRAVENOUS

## 2017-06-28 MED ORDER — ROCURONIUM BROMIDE 10 MG/ML (PF) SYRINGE
PREFILLED_SYRINGE | INTRAVENOUS | Status: DC | PRN
  Administered 2017-06-28: 40 mg via INTRAVENOUS
  Administered 2017-06-28: 10 mg via INTRAVENOUS

## 2017-06-28 MED ORDER — LIDOCAINE 2% (20 MG/ML) 5 ML SYRINGE
INTRAMUSCULAR | Status: DC | PRN
  Administered 2017-06-28: 100 mg via INTRAVENOUS

## 2017-06-28 MED ORDER — DEXAMETHASONE SODIUM PHOSPHATE 10 MG/ML IJ SOLN
INTRAMUSCULAR | Status: DC | PRN
  Administered 2017-06-28: 10 mg via INTRAVENOUS

## 2017-06-28 MED ORDER — CELECOXIB 200 MG PO CAPS
200.0000 mg | ORAL_CAPSULE | ORAL | Status: AC
  Administered 2017-06-28: 200 mg via ORAL
  Filled 2017-06-28: qty 1

## 2017-06-28 MED ORDER — METRONIDAZOLE IN NACL 5-0.79 MG/ML-% IV SOLN
500.0000 mg | INTRAVENOUS | Status: AC
  Administered 2017-06-28: 500 mg via INTRAVENOUS
  Filled 2017-06-28 (×2): qty 100

## 2017-06-28 MED ORDER — MIDAZOLAM HCL 2 MG/2ML IJ SOLN
INTRAMUSCULAR | Status: AC
  Filled 2017-06-28: qty 2

## 2017-06-28 MED ORDER — HYDROCORTISONE 2.5 % RE CREA: 1.0000 "application " | TOPICAL_CREAM | Freq: Two times a day (BID) | RECTAL | 2 refills | Status: DC

## 2017-06-28 MED ORDER — GLYCOPYRROLATE 0.2 MG/ML IV SOSY
PREFILLED_SYRINGE | INTRAVENOUS | Status: DC | PRN
  Administered 2017-06-28: .2 mg via INTRAVENOUS

## 2017-06-28 MED ORDER — BUPIVACAINE-EPINEPHRINE 0.25% -1:200000 IJ SOLN
INTRAMUSCULAR | Status: DC | PRN
  Administered 2017-06-28: 20 mL

## 2017-06-28 MED ORDER — PROPOFOL 10 MG/ML IV BOLUS
INTRAVENOUS | Status: DC | PRN
  Administered 2017-06-28: 50 mg via INTRAVENOUS
  Administered 2017-06-28: 150 mg via INTRAVENOUS

## 2017-06-28 MED ORDER — ONDANSETRON HCL 4 MG/2ML IJ SOLN
INTRAMUSCULAR | Status: AC
  Filled 2017-06-28: qty 2

## 2017-06-28 SURGICAL SUPPLY — 55 items
BENZOIN TINCTURE PRP APPL 2/3 (GAUZE/BANDAGES/DRESSINGS) ×3 IMPLANT
BLADE HEX COATED 2.75 (ELECTRODE) ×3 IMPLANT
BLADE SURG 10 STRL SS (BLADE) IMPLANT
BLADE SURG 15 STRL LF DISP TIS (BLADE) ×1 IMPLANT
BLADE SURG 15 STRL SS (BLADE) ×2
BRIEF STRETCH FOR OB PAD LRG (UNDERPADS AND DIAPERS) ×3 IMPLANT
CANISTER SUCTION 1200CC (MISCELLANEOUS) ×3 IMPLANT
COVER BACK TABLE 60X90IN (DRAPES) ×3 IMPLANT
COVER MAYO STAND STRL (DRAPES) ×3 IMPLANT
DECANTER SPIKE VIAL GLASS SM (MISCELLANEOUS) ×3 IMPLANT
DRAPE LAPAROTOMY 100X72 PEDS (DRAPES) ×3 IMPLANT
DRAPE LG THREE QUARTER DISP (DRAPES) IMPLANT
ELECT NEEDLE TIP 2.8 STRL (NEEDLE) IMPLANT
ELECT REM PT RETURN 9FT ADLT (ELECTROSURGICAL) ×3
ELECTRODE REM PT RTRN 9FT ADLT (ELECTROSURGICAL) ×1 IMPLANT
GAUZE IODOFORM PACK 1/2 7832 (GAUZE/BANDAGES/DRESSINGS) ×3 IMPLANT
GAUZE SPONGE 4X4 16PLY XRAY LF (GAUZE/BANDAGES/DRESSINGS) ×6 IMPLANT
GAUZE SPONGE 4X4 8PLY STR LF (GAUZE/BANDAGES/DRESSINGS) ×3 IMPLANT
GLOVE BIOGEL PI IND STRL 8 (GLOVE) ×1 IMPLANT
GLOVE BIOGEL PI INDICATOR 8 (GLOVE) ×2
GLOVE ECLIPSE 8.0 STRL XLNG CF (GLOVE) ×3 IMPLANT
GOWN STRL REUS W/ TWL LRG LVL3 (GOWN DISPOSABLE) ×1 IMPLANT
GOWN STRL REUS W/ TWL XL LVL3 (GOWN DISPOSABLE) ×1 IMPLANT
GOWN STRL REUS W/TWL LRG LVL3 (GOWN DISPOSABLE) ×2
GOWN STRL REUS W/TWL XL LVL3 (GOWN DISPOSABLE) ×2
KIT RM TURNOVER CYSTO AR (KITS) ×3 IMPLANT
LEGGING LITHOTOMY PAIR STRL (DRAPES) IMPLANT
NEEDLE HYPO 22GX1.5 SAFETY (NEEDLE) ×3 IMPLANT
NS IRRIG 500ML POUR BTL (IV SOLUTION) ×3 IMPLANT
PACK BASIN DAY SURGERY FS (CUSTOM PROCEDURE TRAY) ×3 IMPLANT
PAD ABD 8X10 STRL (GAUZE/BANDAGES/DRESSINGS) ×3 IMPLANT
PENCIL BUTTON HOLSTER BLD 10FT (ELECTRODE) ×3 IMPLANT
SCRUB TECHNI CARE 4 OZ NO DYE (MISCELLANEOUS) ×6 IMPLANT
SHEARS HARMONIC 9CM CVD (BLADE) ×3 IMPLANT
SPONGE SURGIFOAM ABS GEL 100 (HEMOSTASIS) IMPLANT
SPONGE SURGIFOAM ABS GEL 12-7 (HEMOSTASIS) IMPLANT
SURGILUBE 2OZ TUBE FLIPTOP (MISCELLANEOUS) ×3 IMPLANT
SUT CHROMIC 2 0 SH (SUTURE) ×3 IMPLANT
SUT CHROMIC 3 0 SH 27 (SUTURE) IMPLANT
SUT VIC AB 2-0 SH 27 (SUTURE)
SUT VIC AB 2-0 SH 27XBRD (SUTURE) IMPLANT
SUT VIC AB 2-0 UR6 27 (SUTURE) ×21 IMPLANT
SUT VICRYL 0 UR6 27IN ABS (SUTURE) IMPLANT
SUT VICRYL AB 2 0 TIE (SUTURE) IMPLANT
SUT VICRYL AB 2 0 TIES (SUTURE)
SYR 20CC LL (SYRINGE) ×3 IMPLANT
SYR BULB IRRIGATION 50ML (SYRINGE) ×3 IMPLANT
SYR CONTROL 10ML LL (SYRINGE) IMPLANT
TAPE CLOTH 3X10 TAN LF (GAUZE/BANDAGES/DRESSINGS) ×3 IMPLANT
TOWEL OR 17X24 6PK STRL BLUE (TOWEL DISPOSABLE) ×6 IMPLANT
TRAY DSU PREP LF (CUSTOM PROCEDURE TRAY) ×3 IMPLANT
TUBE CONNECTING 12'X1/4 (SUCTIONS) ×1
TUBE CONNECTING 12X1/4 (SUCTIONS) ×2 IMPLANT
UNDERPAD 30X30 INCONTINENT (UNDERPADS AND DIAPERS) ×3 IMPLANT
YANKAUER SUCT BULB TIP NO VENT (SUCTIONS) ×3 IMPLANT

## 2017-06-28 NOTE — Op Note (Signed)
06/28/2017  4:28 PM  PATIENT:  Miranda Harris  56 y.o. female  Patient Care Team: Berkley Harvey, NP as PCP - General (Nurse Practitioner) Michael Boston, MD as Consulting Physician (General Surgery)  PRE-OPERATIVE DIAGNOSIS:    INTERSPHINCTERIC PERIRECTAL FISTULA EXTERNAL HEMORRHOIDS  POST-OPERATIVE DIAGNOSIS:    INTERSPHINCTERIC PERIRECTAL FISTULA CHRONIC PERIRECTAL ABSCESS CAVITY EXTERNAL HEMORRHOIDS  PROCEDURE:    LIFT REPAIR INTERSPHINCTERIC PERIRECTAL FISTULA EXAM UNDER ANESTHESIA  EXTERNAL HEMORRHOIDECTOMY   SURGEON:  Adin Hector, MD  ASSISTANT: [place] Hall-Potvin, PA-S, [employer]   ANESTHESIA:   Local field block Anorectal block General  0.25% bupivacaine with epinephrine WITH Liposomal bupivacaine (Experel)   EBL:  Total I/O In: 800 [I.V.:800] Out: - .  See anesthesia record  Delay start of Pharmacological VTE agent (>24hrs) due to surgical blood loss or risk of bleeding:  no  DRAINS: none   SPECIMEN:  Source of Specimen:  1.  PERIRECTAL FISTULA  2.  EXTERNAL HEMORRHOID TAGS  DISPOSITION OF SPECIMEN:  PATHOLOGY  COUNTS:  YES  PLAN OF CARE: Discharge to home after PACU  PATIENT DISPOSITION:  PACU - hemodynamically stable.  INDICATION: Patient with probable perirectal fistula.  I recommended examination and surgical treatment:  The anatomy & physiology of the anorectal region was discussed.  We discussed the pathophysiology of anorectal abscess and fistula.  Differential diagnosis was discussed.  Natural history progression was discussed.   I stressed the importance of a bowel regimen to have daily soft bowel movements to minimize progression of disease.     The patient's condition is not adequately controlled.  Non-operative treatment has not healed the fistula.  Therefore, I recommended examination under anaesthesia to confirm the diagnosis and treat the fistula.  I discussed techniques that may be required such as fistulotomy, ligation  by LIFT technique, and/or seton placement.  Benefits & alternatives discussed.  I noted a good likelihood this will help address the problem, but sometimes repeat operations and prolonged healing times may occur.  Risks such as bleeding, pain, recurrence, reoperation, incontinence, heart attack, death, and other risks were discussed.      Educational handouts further explaining the pathology, treatment options, and bowel regimen were given.  The patient expressed understanding & wishes to proceed.  We will work to coordinate surgery for a mutually convenient time.   OR FINDINGS: patient had a superficial right anterior midline fistula.  Patient had an intersphincteric fistula.    External location RIGHT ANTERIOR   about 4 cm from anal verge.  Internal location : RIGHT Anterior midline at anal crypt about 1 cm from anal verge.  Chronic right sided ischiorectal fat 7x4x4cm cavity consistent with drained with collapsed perirectal abscess.  Contiguous with intersphincteric fistula.  Unroofed with counterincision right posteriorly..External hemorrhoidal tags along anterior and left & right anterior circumference.  Some excised to remove chronic scar.  DESCRIPTION:   Informed consent was confirmed. Patient underwent general anesthesia without difficulty. Patient was placed into prone positioning.  The perianal region was prepped and draped in sterile fashion. Surgical timeout confirmed or plan.  I did digital rectal examination and then transitioned over to anoscopy to get a sense of the anatomy.  Patient had a 2 x 2 centimeter elevated patch of hypertrophic granulation tissue in the right anterior aspect.  Pressing on this relieves some thinly feculent material and coagulum.   I did place a probe through this external opening.  It did head toward the right anterior sphincter complex but I could not  get it to go through it.  I also injected the track with methylene blue.  With this I was able to locate an  internal opening in the right anterior anal canal.  In examining this area patient had a superficial perirectal fistula in addition on top of it.  It was 1 cm long and superficial.  The deeper tract did not feel superficial, concerning for a probable intersphincteric fistula.  No abscess located.  I did a fistulotomy of the superficial fistula after confirming the sphincters were not involved.  The resulting external hemorrhoid tags around here were carefully trimmed off to help flatten out the anal verge.  I began to excise the granulation tissue of the external opening.  As I did this I came into an obvious cavity in the right anterior ischial rectal fat pad.  It was thinly stained with some methylene blue.  I could probe my finger easily up towards the right posterior SQ fat about 7 cm.  I came around this  collapsed cavity to help straighten out the fistulous tract.  With that I could get the probe to go intersphincterically with a proximal exit point at a right anterior anal crypt.   I went ahead and proceeded with the LIFT technique.  I made a transverse incision at the right anterior anal squamocolumnar junction .  Did careful dissection to get down to the sphincter complex.  I carefully went between the internal and external sphincter using careful blunt dissection parallel to the fibers.  There was chronically inflamed.  Planes were poor and scarred Carefully, I was able to locate the intersphincteric component of the fistulous tract.  I was able to get around it gently with a right angle clamp.  I carefully skeletonized the intersphincteric component.  I placed 2-0 Vicryl stitches through the intersphincteric tract on the proximal side and on the distal side in between the external & internal sphincters.  I transected the fistulous tract between the sphincters.  I then ligated the transected opened fistulous tract with 2-0 Vicryl again parallel to the sphincter fibers.  I then transitioned to the  rectal component.  Did a figure-of-eight stitch of 2-0 Vicryl suture 6 centimeters proximal to the internal opening in the rectum.  I ran that longitudinally until it came to the opening.  I debrided off some fibrous material and side had much healthy noninflamed anoderm.   I transected the rectal tissue anorectal component and opening like an internal hemorrhoidectomy to get healthier tissue.  I then ran the 2-0 Vicryl stitch down to the anal verge to also help cover up the intersphincteric ligation wound as well.  I tied that running suture down, thus closing the internal opening and protecting the LIFT repair.  I did this over a large Hill-Ferguson retractor to avoid narrowing.[name]  Hemostasis was excellent.  I excised to excise the external opening.  I transitioned to cautery and help free the ischiorectal external part of the fistulous tract circumferentially all way down towards the sphincter component.  I removed the superficial end of the fistulous tract.  I probed the methylene blue tract to fine a short segmentof the fistula tract going towards the sphincter complex.  I confirmed that I had ligated the intersphincteric tract.  I additionallyligated the base of the external wound just outside the sphincter component with 2-0 vicryl.   I excised some redundant skin to create a more open and flat wound that would not easily closed down.  This help remove  some redundancies and also take a little tension off the closure of the anal verge.   I then closed over the anoderm skin and anal verge skin sphincters and anal verge with interrupted 2-0 Vicryl horizontal mattress sutures over a medium Hill-Ferguson retractor.  I reexamined the anal canal.  There was was no narrowing.  Hemostasis was excellent.  I repeated anoscopy and examination.  Hemostasis was good.  Sphincter ring complex felt intact. I created a counter incision in the posterior gluteal region.  I packed a 1 inch iodoform Nu Gauze from the large right  anterior wound out the counterincision posterior wound.  We placed fluff gauze to onlay over the wounds.  Patient is being extubated go to recovery room.  I had discussed postop care with the patient and her husband in the holding area.  Instructions are written as well.  I discussed operative findings, updated the patient's status, discussed probable steps to recovery, and gave postoperative recommendations to the patient's spouse.  Recommendations were made.  Questions were answered.  He expressed understanding & appreciation.   Adin Hector, M.D., F.A.C.S. Gastrointestinal and Minimally Invasive Surgery [facility] [address] [place], [place] [address] [phone] Main / Paging

## 2017-06-28 NOTE — Discharge Instructions (Signed)
ANORECTAL SURGERY:  POST OPERATIVE INSTRUCTIONS  ######################################################################  EAT Start with a pureed / full liquid diet After 24 hours, gradually transition to a high fiber diet.    CONTROL PAIN Control pain so you can tolerate bowel movements,  walk, sleep, tolerate sneezing/coughing, and go up/down stairs.   HAVE A BOWEL MOVEMENT DAILY Keep your bowels regular to avoid problems.   Taking a fiber supplement every day help.   Try a laxative to override constipation. Use an antidairrheal to slow down diarrhea.   Call if not better after 2 tries  WALK Walk an hour a day.  Control your pain to do that.   CALL IF YOU HAVE PROBLEMS/CONCERNS Call if you are still struggling despite following these instructions. Call if you have concerns not answered by these instructions  ######################################################################    1. Take your usually prescribed home medications unless otherwise directed. 2. DIET: Follow a light bland diet the first 24 hours after arrival home, such as soup, liquids, crackers, etc.  Be sure to include lots of fluids daily.  Avoid fast food or heavy meals as your are more likely to get nauseated.  Eat a low fat the next few days after surgery.   3. PAIN CONTROL: a. Pain is best controlled by a usual combination of three different methods TOGETHER: i. Ice/Heat ii. Over the counter pain medication iii. Prescription pain medication b. Expect swelling and discomfort in the anus/rectal area.  Warm water baths (30-60 minutes up to 6 times a day, especially after bowel meovements) will help. Use ice for the first few days to help decrease swelling and bruising, then switch to heat such as warm towels, sitz baths, warm baths, etc to help relax tight/sore spots and speed recovery.  Some people prefer to use ice alone, heat alone, alternating between ice & heat.  Experiment to what works for you.    c. It is helpful to take an over-the-counter pain medication regularly for the first few weeks.  Choose one of the following that works best for you: i. Naproxen (Aleve, etc)  Two 244m tabs twice a day ii. Ibuprofen (Advil, etc) Three 2023mtabs four times a day (every meal & bedtime) iii. Acetaminophen (Tylenol, etc) 500-65031mour times a day (every meal & bedtime) d. A  prescription for pain medication (such as oxycodone, hydrocodone, etc) should be given to you upon discharge.  Take your pain medication as prescribed.  i. If you are having problems/concerns with the prescription medicine (does not control pain, nausea, vomiting, rash, itching, etc), please call us [phone] see if we need to switch you to a different pain medicine that will work better for you and/or control your side effect better. ii. If you need a refill on your pain medication, please contact your pharmacy.  They will contact our office to request authorization. Prescriptions will not be filled after 5 pm or on week-ends.  Use a Sitz Bath 4-8 times a day for relief   [employer] bath is a warm water bath taken in the sitting position that covers only the hips and buttocks. It may be used for either healing or hygiene purposes. Sitz baths are also used to relieve pain, itching, or muscle spasms. The water may contain medicine. Moist heat will help you heal and relax.  HOME CARE INSTRUCTIONS  Take 3 to 4 sitz baths a day. 1. Fill the bathtub half full with warm water. 2. Sit in the water and open  the drain a little. 3. Turn on the warm water to keep the tub half full. Keep the water running constantly. 4. Soak in the water for 15 to 20 minutes. 5. After the sitz bath, pat the affected area dry first.   4. KEEP YOUR BOWELS REGULAR a. The goal is one bowel movement a day b. Avoid getting constipated.  Between the surgery and the pain medications, it is common to experience some constipation.  Increasing  fluid intake and taking a fiber supplement (such as Metamucil, Citrucel, FiberCon, MiraLax, etc) 2-3 times a day regularly will usually help prevent this problem from occurring.  A mild laxative (prune juice, Milk of Magnesia, MiraLax, etc) should be taken according to package directions if there are no bowel movements after 48 hours. c. Watch out for diarrhea.  If you have many loose bowel movements, simplify your diet to bland foods & liquids for a few days.  Stop any stool softeners and decrease your fiber supplement.  Switching to mild anti-diarrheal medications (Kayopectate, Pepto Bismol) can help.  If this worsens or does not improve, please call us.  5. Wound Care  a. Remove your bandages with your first bowel movement, usually the day after surgery.  You may have packing if you had an abscess.  Let any packing come out.   b. Wear an absorbent pad or soft cotton balls in your underwear as needed to catch any drainage and help keep the area  c. Keep the area clean and dry.  Bathe / shower every day.  Keep the area clean by showering / bathing over the incision / wound.   It is okay to soak an open wound to help wash it.  Wet wipes or showers / gentle washing after bowel movements is often less traumatic than regular toilet paper. d. Dennis Bast will often notice bleeding with bowel movements.  This should slow down by the end of the first week of surgery.  Sitting on an ice pack can help. e. Expect some drainage.  This should slow down by the end of the first week of surgery, but you will have occasional bleeding or drainage up to a few months after surgery.  Wear an absorbent pad or soft cotton gauze in your underwear until the drainage stops.  6. ACTIVITIES as tolerated:   a. You may resume regular (light) daily activities beginning the next day--such as daily self-care, walking, climbing stairs--gradually increasing activities as tolerated.  If you can walk 30 minutes without difficulty, it is safe to  try more intense activity such as jogging, treadmill, bicycling, low-impact aerobics, swimming, etc. b. Save the most intensive and strenuous activity for last such as sit-ups, heavy lifting, contact sports, etc  Refrain from any heavy lifting or straining until you are off narcotics for pain control.   c. DO NOT PUSH THROUGH PAIN.  Let pain be your guide: If it hurts to do something, don't do it.  Pain is your body warning you to avoid that activity for another week until the pain goes down. d. You may drive when you are no longer taking prescription pain medication, you can comfortably sit for long periods of time, and you can safely maneuver your car and apply brakes. e. Dennis Bast may have sexual intercourse when it is comfortable.  7. FOLLOW UP in our office a. Please call [facility] at [phone] to set up an appointment to see your surgeon in the office for a follow-up appointment approximately 2 weeks after  your surgery. b. Make sure that you call for this appointment the day you arrive home to insure a convenient appointment time. 10. IF YOU HAVE DISABILITY OR FAMILY LEAVE FORMS, BRING THEM TO THE OFFICE FOR PROCESSING.  DO NOT GIVE THEM TO YOUR DOCTOR.        WHEN TO CALL us [phone]: 1. Poor pain control 2. Reactions / problems with new medications (rash/itching, nausea, etc)  3. Fever over 101.5 F (38.5 C) 4. Inability to urinate 5. Nausea and/or vomiting 6. Worsening swelling or bruising 7. Continued bleeding from incision. 8. Increased pain, redness, or drainage from the incision  The clinic staff is available to answer your questions during regular business hours (8:30am-5pm).  Please dont hesitate to call and ask to speak to one of our nurses for clinical concerns.   A surgeon from [facility] is always on call at the hospitals   If you have a medical emergency, go to the nearest emergency room or call 911.    [facility], [place], [place], [place], [place]  [address] ? MAIN: [phone] ? TOLL FREE: [phone] ? FAX [phone] [email or web address]    Anal Fistula An anal fistula is an abnormal tunnel that develops between the bowel and the skin near the outside of the anus, where stool (feces) comes out. The anus has many tiny glands that make lubricating fluid. Sometimes, these glands become plugged and infected, and that can cause a fluid-filled pocket (abscess) to form. An anal fistula often develops after this infection or abscess. What are the causes? In most cases, an anal fistula is caused by a past or current anal abscess. Other causes include:  A complication of surgery.  Trauma to the rectal area.  Radiation to the area.  Medical conditions or diseases, such as: ? Chronic inflammatory bowel disease, such as Crohn disease or ulcerative colitis. ? Colon cancer or rectal cancer. ? Diverticular disease, such as diverticulitis. ? An STD (sexually transmitted disease), such as gonorrhea, chlamydia, or syphilis. ? An infection that is caused by HIV (human immunodeficiency virus). ? Foreign body in the rectum.  What are the signs or symptoms? Symptoms of this condition include:  Throbbing or constant pain that may be worse while you are sitting.  Swelling or irritation around the anus.  Drainage of pus or blood from an opening near the anus.  Pain with bowel movements.  Fever or chills.  How is this diagnosed? Your health care provider will examine the area to find the openings of the anal fistula and the fistula tract. The external opening of the anal fistula may be seen during a physical exam. You may also have tests, including:  An exam of the rectal area with a gloved hand (digital rectal exam).  An exam with a probe or scope to help locate the internal opening of the fistula.  Imaging tests to find the exact location and path of the fistula. These tests may include  X-rays, an ultrasound, a CT scan, or MRI. The path is made visible by a dye that is injected into the fistula opening.  You may have other tests to find the cause of the anal fistula. How is this treated? The most common treatment for an anal fistula is surgery. The type of surgery that is used will depend on where the fistula is located and how complex the fistula is. Surgical options include:  A fistulotomy. The whole fistula is opened  up, and the contents are drained to promote healing.  Seton placement. A silk string (seton) is placed into the fistula during a fistulotomy. This helps to drain any infection to promote healing.  Advancement flap procedure. Tissue is removed from your rectum or the skin around the anus and is attached to the opening of the fistula.  Bioprosthetic plug. A cone-shaped plug is made from your tissue and is used to block the opening of the fistula.  Some anal fistulas do not require surgery. A nonsurgical treatment option involves injecting a fibrin glue to seal the fistula. You also may be prescribed an antibiotic medicine to treat an infection. Follow these instructions at home: Medicines  Take over-the-counter and prescription medicines only as told by your health care provider.  If you were prescribed an antibiotic medicine, take it as told by your health care provider. Do not stop taking the antibiotic even if you start to feel better.  Use a stool softener or a laxative if told to do so by your health care provider. General instructions  Eat a high-fiber diet as told by your health care provider. This can help to prevent constipation.  Drink enough fluid to keep your urine clear or pale yellow.  Take a warm sitz bath for 15-20 minutes, 3-4 times per day, or as told by your health care provider. Sitz baths can ease your pain and discomfort and help with healing.  Follow good hygiene to keep the anal area as clean and dry as possible. Use wet toilet  paper or a moist towelette after each bowel movement.  Keep all follow-up visits as told by your health care provider. This is important. Contact a health care provider if:  You have increased pain that is not controlled with medicines.  You have new redness or swelling around the anal area.  You have new fluid, blood, or pus coming from the anal area.  You have tenderness or warmth around the anal area. Get help right away if:  You have a fever.  You have severe pain.  You have chills or diarrhea.  You have severe problems urinating or having a bowel movement. This information is not intended to replace advice given to you by your health care provider. Make sure you discuss any questions you have with your health care provider. Document Released: 08/25/2008 Document Revised: 02/18/2016 Document Reviewed: 12/08/2014 Elsevier Interactive Patient Education  2018 [place] [facility] Home Care Instructions  Activity: Get plenty of rest for the remainder of the day. A responsible individual must stay with you for 24 hours following the procedure.  For the next 24 hours, DO NOT: -Drive a car -Paediatric nurse -Drink alcoholic beverages -Take any medication unless instructed by your physician -Make any legal decisions or sign important papers.  Meals: Start with liquid foods such as gelatin or soup. Progress to regular foods as tolerated. Avoid greasy, spicy, heavy foods. If nausea and/or vomiting occur, drink only clear liquids until the nausea and/or vomiting subsides. Call your physician if vomiting continues.  Special Instructions/Symptoms: Your throat may feel dry or sore from the anesthesia or the breathing tube placed in your throat during surgery. If this causes discomfort, gargle with warm salt water. The discomfort should disappear within 24 hours.  If you had a scopolamine patch placed behind your ear for the management of post- operative nausea and/or  vomiting:  1. The medication in the patch is effective for 72 hours, after which it should be  removed.  Wrap patch in a tissue and discard in the trash. Wash hands thoroughly with soap and water. 2. You may remove the patch earlier than 72 hours if you experience unpleasant side effects which may include dry mouth, dizziness or visual disturbances. 3. Avoid touching the patch. Wash your hands with soap and water after contact with the patch.   Information for Discharge Teaching: EXPAREL (bupivacaine liposome injectable suspension)   Your surgeon gave you EXPAREL(bupivacaine) in your surgical incision to help control your pain after surgery.   EXPAREL is a local anesthetic that provides pain relief by numbing the tissue around the surgical site.  EXPAREL is designed to release pain medication over time and can control pain for up to 72 hours.  Depending on how you respond to EXPAREL, you may require less pain medication during your recovery.  Possible side effects:  Temporary loss of sensation or ability to move in the area where bupivacaine was injected.  Nausea, vomiting, constipation  Rarely, numbness and tingling in your mouth or lips, lightheadedness, or anxiety may occur.  Call your doctor right away if you think you may be experiencing any of these sensations, or if you have other questions regarding possible side effects.  Follow all other discharge instructions given to you by your surgeon or nurse. Eat a healthy diet and drink plenty of water or other fluids.  If you return to the hospital for any reason within 96 hours following the administration of EXPAREL, please inform your health care providers.

## 2017-06-28 NOTE — Anesthesia Postprocedure Evaluation (Signed)
Anesthesia Post Note  Patient: Riata Ikeda  Procedure(s) Performed: EXAM UNDER ANESTHESIA WITH EXTERNAL HEMORRHOIDECTOMY AND REPAIR OF PERIRECTAL FISTULA (N/A )     Patient location during evaluation: PACU Anesthesia Type: General Level of consciousness: awake and alert Pain management: pain level controlled Vital Signs Assessment: post-procedure vital signs reviewed and stable Respiratory status: spontaneous breathing, nonlabored ventilation, respiratory function stable and patient connected to nasal cannula oxygen Cardiovascular status: blood pressure returned to baseline and stable Postop Assessment: no apparent nausea or vomiting Anesthetic complications: no    Last Vitals:  Vitals:   06/28/17 1730 06/28/17 1745  BP: 118/75 131/78  Pulse: 64 62  Resp: 18 16  Temp:    SpO2: 100% 100%    Last Pain:  Vitals:   06/28/17 1730  TempSrc:   PainSc: 4                  Tiajuana Amass

## 2017-06-28 NOTE — Anesthesia Procedure Notes (Signed)
Procedure Name: Intubation Date/Time: 06/28/2017 2:52 PM Performed by: Candida Peeling RAY Pre-anesthesia Checklist: Patient identified, Emergency Drugs available, Suction available and Patient being monitored Patient Re-evaluated:Patient Re-evaluated prior to induction Oxygen Delivery Method: Circle system utilized Preoxygenation: Pre-oxygenation with 100% oxygen Induction Type: IV induction Ventilation: Mask ventilation without difficulty Laryngoscope Size: Mac and 3 Grade View: Grade I Tube type: Oral Number of attempts: 1 Airway Equipment and Method: Stylet and LTA kit utilized Placement Confirmation: ETT inserted through vocal cords under direct vision,  positive ETCO2 and breath sounds checked- equal and bilateral Secured at: 21 cm Tube secured with: Tape Dental Injury: Teeth and Oropharynx as per pre-operative assessment

## 2017-06-28 NOTE — H&P (View-Only) (Signed)
Miranda Harris 06/20/2017 9:20 AM Location: [facility] Patient #: [id number] DOB: 1961/08/30 Married / Language: Cleophus Molt / Race: White Female  History of Present Illness Adin Hector MD; 06/20/2017 10:09 AM) The patient is a 56 year old female who presents with anal fistula. Note for "Anal fistula": ` ` ` Patient sent for surgical consultation at the request of Dr. Lucia Gaskins  Chief Complaint: [name]  The patient is a 56 year old female who had severe perirectal pain and found to have a perirectal abscess suspected in late June. Seen by our office in July. Felt to have a draining perirectal abscess and possible fistula. Dr. Elder Love recommended to complete antibiotics and probable examination under anesthesia and fistula repair once the infection resolved. Unfortunately she felt worsening pain and swelling. Repeat CT scan showed worsening abscess. Therefore she underwent emergent incision and drainage 04/18/2017 by Dr Excell Seltzer. Fistula diagnosed. Seton placed since it was felt to involve the external sphincter at least. Sent follow-up in the office. Delphina Cahill was irritating. In trying to adjust it. Loosened and fell out. Followed up back with original surgeon Dr. Lucia Gaskins. Seton apparently fell out. Recommendation made for semination under anesthesia with fistula repair. She wished for a second opinion, therefore Dr. Lucia Gaskins sent to me.  She notes that she is having some mild blood and drainage. Moving her bowels a little more often than usual. She takes Metamucil every day and usually moves her bowels once or twice a day. She's occasionally up to 3 about once a day. More loose. Endoscopies in the past for polyps. She believes the last one was in 2016 or 17. She's been off antibiotics for a while. No personal nor family history of GI/colon cancer, inflammatory bowel disease, irritable bowel syndrome, allergy such as Celiac Sprue, dietary/dairy problems, colitis,  ulcers nor gastritis. No recent sick contacts/gastroenteritis. No travel outside the country. No changes in diet. No dysphagia to solids or liquids. No significant heartburn or reflux. No hematochezia, hematemesis, coffee ground emesis. No evidence of prior gastric/peptic ulceration.   (Review of systems as stated in this history (HPI) or in the review of systems. Otherwise all other 12 point ROS are negative)    Physical Exam Adin Hector MD; 06/20/2017 10:10 AM)  General Mental Status-Alert. General Appearance-Not in acute distress, Not Sickly. Orientation-Oriented X3. Hydration-Well hydrated. Voice-Normal.  Integumentary Global Assessment Upon inspection and palpation of skin surfaces of the - Axillae: non-tender, no inflammation or ulceration, no drainage. and Distribution of scalp and body hair is normal. General Characteristics Temperature - normal warmth is noted.  Head and Neck Head-normocephalic, atraumatic with no lesions or palpable masses. Face Global Assessment - atraumatic, no absence of expression. Neck Global Assessment - no abnormal movements, no bruit auscultated on the right, no bruit auscultated on the left, no decreased range of motion, non-tender. Trachea-midline. Thyroid Gland Characteristics - non-tender.  Eye Eyeball - Left-Extraocular movements intact, No Nystagmus. Eyeball - Right-Extraocular movements intact, No Nystagmus. Cornea - Left-No Hazy. Cornea - Right-No Hazy. Sclera/Conjunctiva - Left-No scleral icterus, No Discharge. Sclera/Conjunctiva - Right-No scleral icterus, No Discharge. Pupil - Left-Direct reaction to light normal. Pupil - Right-Direct reaction to light normal.  ENMT Ears Pinna - Left - no drainage observed, no generalized tenderness observed. Right - no drainage observed, no generalized tenderness observed. Nose and Sinuses External Inspection of the Nose - no destructive lesion  observed. Inspection of the nares - Left - quiet respiration. Right - quiet respiration. Mouth and Throat Lips - Upper  Lip - no fissures observed, no pallor noted. Lower Lip - no fissures observed, no pallor noted. Nasopharynx - no discharge present. Oral Cavity/Oropharynx - Tongue - no dryness observed. Oral Mucosa - no cyanosis observed. Hypopharynx - no evidence of airway distress observed.  Chest and Lung Exam Inspection Movements - Normal and Symmetrical. Accessory muscles - No use of accessory muscles in breathing. Palpation Palpation of the chest reveals - Non-tender. Auscultation Breath sounds - Normal and Clear.  Cardiovascular Auscultation Rhythm - Regular. Murmurs & Other Heart Sounds - Auscultation of the heart reveals - No Murmurs and No Systolic Clicks.  Abdomen Inspection Inspection of the abdomen reveals - No Visible peristalsis and No Abnormal pulsations. Umbilicus - No Bleeding, No Urine drainage. Palpation/Percussion Palpation and Percussion of the abdomen reveal - Soft, Non Tender, No Rebound tenderness, No Rigidity (guarding) and No Cutaneous hyperesthesia. Note: Abdomen obese but soft. Nondistended. Mild discomfort left lower side but no guarding. No umbilical or other anterior abdominal wall hernias  Female Genitourinary Sexual Maturity Tanner 5 - Adult hair pattern. Note: No vaginal bleeding nor discharge  Rectal Note: Perianal skin clear. Right anterior perianal hypertrophic granulation tissue 1 cm, 2.5 cm from sphincter. Some drainage. Cord felt toward sphincter. Consistent with perirectal fistula. No abscess. No cellulitis. Mildly increased sphincter tone. No fissure. Anterior external hemorrhoid tags with mild irritation. No pruritus. NO warts. Held off on more aggressive digital or anoscopic exam given her recent prior examinations  Peripheral Vascular Upper Extremity Inspection - Left - No Cyanotic nailbeds, Not Ischemic. Right - No Cyanotic  nailbeds, Not Ischemic.  Neurologic Neurologic evaluation reveals -normal attention span and ability to concentrate, able to name objects and repeat phrases. Appropriate fund of knowledge , normal sensation and normal coordination. Mental Status Affect - not angry, not paranoid. Cranial Nerves-Normal Bilaterally. Gait-Normal.  Neuropsychiatric Mental status exam performed with findings of-able to articulate well with normal speech/language, rate, volume and coherence, thought content normal with ability to perform basic computations and apply abstract reasoning and no evidence of hallucinations, delusions, obsessions or homicidal/suicidal ideation.  Musculoskeletal Global Assessment Spine, Ribs and Pelvis - no instability, subluxation or laxity. Right Upper Extremity - no instability, subluxation or laxity.  Lymphatic Head & Neck  General Head & Neck Lymphatics: Bilateral - Description - No Localized lymphadenopathy. Axillary  General Axillary Region: Bilateral - Description - No Localized lymphadenopathy. Femoral & Inguinal  Generalized Femoral & Inguinal Lymphatics: Left - Description - No Localized lymphadenopathy. Right - Description - No Localized lymphadenopathy.    Assessment & Plan Adin Hector MD; 06/20/2017 10:04 AM)  ANAL FISTULA (K60.3) Impression: No evidence of abscess but definite persistent chronic anal fistulous tract.  I agree that she would benefit from examination under anesthesia and fistulotomy versus repair. I suspect that this is at least partially transected enteric and will require a LIFT repair. Possible repeat seton.  I do not think she needs an MRI at this point. Anatomy seems rather straightforward. Type of intervention and repair will be based on intraoperative findings  I reassured her she has no active infection or abscess at that time. Explained the reasoning for the drainage of the abscess with seton placement. Allow the infection  and cellulitis to resolve. Allow the fistula tract to mature before proceeding with fistula repair surgery. I think that help her understand why there was the need for emergent and then delayed intervention. She expressed understanding and is interested in proceeding with surgery.  Current Plans Pt Education -  CCS Abscess/Fistula (AT): discussed with patient and provided information. The anatomy & physiology of the anorectal region was discussed. We discussed the pathophysiology of anorectal abscess and fistula. Differential diagnosis was discussed. Natural history progression was discussed. I stressed the importance of a bowel regimen to have daily soft bowel movements to minimize progression of disease.  The patient's condition is not adequately controlled. Non-operative treatment has not healed the fistula. Therefore, I recommended examination under anaesthesia to confirm the diagnosis and treat the fistula. I discussed techniques that may be required such as fistulotomy, ligation by LIFT technique, and/or seton placement. Benefits & alternatives discussed. I noted a good likelihood this will help address the problem, but sometimes repeat operations and prolonged healing times may occur. Risks such as bleeding, pain, recurrence, reoperation, incontinence, heart attack, death, and other risks were discussed.  Educational handouts further explaining the pathology, treatment options, and bowel regimen were given. The patient expressed understanding & wishes to proceed. We will work to coordinate surgery for a mutually convenient time.  ENCOUNTER FOR PREOPERATIVE EXAMINATION FOR GENERAL SURGICAL PROCEDURE (Z01.818)  Current Plans You are being scheduled for surgery- Our schedulers will call you.  You should hear from our office's scheduling department within 5 working days about the location, date, and time of surgery. We try to make accommodations for patient's preferences in  scheduling surgery, but sometimes the OR schedule or the surgeon's schedule prevents [place] from making those accommodations.  If you have not heard from our office [phone]) in 5 working days, call the office and ask for your surgeon's nurse.  If you have other questions about your diagnosis, plan, or surgery, call the office and ask for your surgeon's nurse.  Pt Education - CCS Rectal Prep for Anorectal outpatient/office surgery: discussed with patient and provided information. Pt Education - CCS Rectal Surgery HCI (Halo Shevlin): discussed with patient and provided information. Pt Education - CCS Good Bowel Health (Shauntavia Brackin) EXTERNAL HEMORRHOIDS WITH COMPLICATION (M19.6) Impression: Some irritated external hemorrhoids anteriorly. She would like to have those removed at the same time if possible. I think that is reasonable.  Current Plans The anatomy & physiology of the anorectal region was discussed. The pathophysiology of hemorrhoids and differential diagnosis was discussed. Natural history risks without surgery was discussed. I stressed the importance of a bowel regimen to have daily soft bowel movements to minimize progression of disease. Interventions such as sclerotherapy & banding were discussed.  The patient's symptoms are not adequately controlled by medicines and other non-operative treatments. I feel the risks & problems of no surgery outweigh the operative risks; therefore, I recommended surgery to treat the hemorrhoids by ligation, pexy, and possible resection.  Risks such as bleeding, infection, urinary difficulties, need for further treatment, heart attack, death, and other risks were discussed. I noted a good likelihood this will help address the problem. Goals of post-operative recovery were discussed as well. Possibility that this will not correct all symptoms was explained. Post-operative pain, bleeding, constipation, and other problems after surgery were discussed. We will  work to minimize complications. Educational handouts further explaining the pathology, treatment options, and bowel regimen were given as well. Questions were answered. The patient expresses understanding & wishes to proceed with surgery.  Adin Hector, M.D., F.A.C.S. Gastrointestinal and Minimally Invasive Surgery [facility] [address] [place], [place] [address] [phone] Main / Paging

## 2017-06-28 NOTE — Anesthesia Preprocedure Evaluation (Signed)
Anesthesia Evaluation  Patient identified by MRN, date of birth, ID band Patient awake    Reviewed: Allergy & Precautions, NPO status , Patient's Chart, lab work & pertinent test results  Airway Mallampati: II  TM Distance: >3 FB Neck ROM: Full    Dental  (+) Teeth Intact, Dental Advisory Given   Pulmonary former smoker,    Pulmonary exam normal breath sounds clear to auscultation       Cardiovascular negative cardio ROS Normal cardiovascular exam Rhythm:Regular Rate:Normal     Neuro/Psych PSYCHIATRIC DISORDERS Bipolar Disorder negative neurological ROS     GI/Hepatic negative GI ROS, Neg liver ROS, Perirectal fistula    Endo/Other  Hypothyroidism Obesity   Renal/GU Renal InsufficiencyRenal disease (Related to lithium use)     Musculoskeletal negative musculoskeletal ROS (+)   Abdominal   Peds  Hematology negative hematology ROS (+)   Anesthesia Other Findings Day of surgery medications reviewed with the patient.  Reproductive/Obstetrics                             Anesthesia Physical Anesthesia Plan  ASA: II  Anesthesia Plan: General   Post-op Pain Management:    Induction: Intravenous  PONV Risk Score and Plan: 3 and Ondansetron, Dexamethasone and Midazolam  Airway Management Planned: Oral ETT  Additional Equipment:   Intra-op Plan:   Post-operative Plan: Extubation in OR  Informed Consent: I have reviewed the patients History and Physical, chart, labs and discussed the procedure including the risks, benefits and alternatives for the proposed anesthesia with the patient or authorized representative who has indicated his/her understanding and acceptance.   Dental advisory given  Plan Discussed with: CRNA  Anesthesia Plan Comments: (Risks/benefits of general anesthesia discussed with patient including risk of damage to teeth, lips, gum, and tongue, nausea/vomiting,  allergic reactions to medications, and the possibility of heart attack, stroke and death.  All patient questions answered.  Patient wishes to proceed.)        Anesthesia Quick Evaluation

## 2017-06-28 NOTE — Transfer of Care (Signed)
  Last Vitals:  Vitals:   06/28/17 1313 06/28/17 1643  BP: 125/73 (P) 119/69  Pulse: 67   Resp: 16   Temp: 37.2 C (P) 36.8 C  SpO2: 96%     Last Pain:  Vitals:   06/28/17 1349  TempSrc:   PainSc: 4       Patients Stated Pain Goal: 0 (06/28/17 1349) Immediate Anesthesia Transfer of Care Note  Patient: Miranda Harris  Procedure(s) Performed: Procedure(s) (LRB): EXAM UNDER ANESTHESIA WITH EXTERNAL HEMORRHOIDECTOMY AND REPAIR OF PERIRECTAL FISTULA (N/A)  Patient Location: PACU  Anesthesia Type: General  Level of Consciousness: awake, alert  and oriented  Airway & Oxygen Therapy: Patient Spontanous Breathing and Patient connected to face mask oxygen  Post-op Assessment: Report given to PACU RN and Post -op Vital signs reviewed and stable  Post vital signs: Reviewed and stable  Complications: No apparent anesthesia complications

## 2017-06-28 NOTE — Interval H&P Note (Signed)
History and Physical Interval Note:  06/28/2017 2:35 PM  Miranda Harris  has presented today for surgery, with the diagnosis of PERIRECTAL FISTULA, EXTERNAL HEMORRHOIDS  The various methods of treatment have been discussed with the patient and family. After consideration of risks, benefits and other options for treatment, the patient has consented to  Procedure(s): EXAM UNDER ANESTHESIA WITH EXTERNAL HEMORRHOIDECTOMY AND REPAIR OF PERIRECTAL FISTULA (N/A) as a surgical intervention .  The patient's history has been reviewed, patient examined, no change in status, stable for surgery.  I have reviewed the patient's chart and labs.  Questions were answered to the patient's satisfaction.    I have re-reviewed the the patient's records, history, medications, and allergies.  I have re-examined the patient.  I again discussed intraoperative plans and goals of post-operative recovery.  The patient agrees to proceed.  Analys Miranda Harris  23-May-1961 [id number]  Patient Care Team: Berkley Harvey, NP as PCP - General (Nurse Practitioner) Michael Boston, MD as Consulting Physician (General Surgery)  Patient Active Problem List   Diagnosis Date Noted  . Bipolar I disorder, most recent episode depressed ([place]) 06/22/2016  . Bursitis, trochanteric 04/19/2015  . Affective bipolar disorder ([place]) 06/25/2014  . Arthralgia of hip 06/25/2014  . Adult hypothyroidism 06/25/2014    Past Medical History:  Diagnosis Date  . Bipolar disorder ([place])   . Elevated serum creatinine    related to lithium use  . Helicobacter pylori (H. pylori) infection    pt denies  . Hypothyroidism   . Perirectal fistula   . Thyroid disease   . Trochanteric bursitis of right hip     Past Surgical History:  Procedure Laterality Date  . HERNIA REPAIR  7-84 yrs ago   umbilical hernia  . INCISION AND DRAINAGE PERIRECTAL ABSCESS N/A 04/18/2017   Procedure: IRRIGATION AND DEBRIDEMENT PERIRECTAL ABSCESS, placement of seton, sigmoidoscopy;   Surgeon: Excell Seltzer, MD;  Location: [facility];  Service: General;  Laterality: N/A;  . LAPAROSCOPIC APPENDECTOMY    . VAGINAL HYSTERECTOMY Bilateral    complete  . WISDOM TOOTH EXTRACTION      Social History   Social History  . Marital status: Married    Spouse name: N/A  . Number of children: N/A  . Years of education: N/A   Occupational History  . Not on file.   Social History Main Topics  . Smoking status: Former Smoker    Packs/day: 1.00    Years: 30.00    Types: Cigarettes  . Smokeless tobacco: Never Used     Comment: quit 2008  . Alcohol use No     Comment: Occasional use  . Drug use: No  . Sexual activity: Yes    Partners: Male    Birth control/ protection: Surgical   Other Topics Concern  . Not on file   Social History Narrative  . No narrative on file    Family History  Problem Relation Age of Onset  . Anxiety disorder Brother     Current Facility-Administered Medications  Medication Dose Route Frequency Provider Last Rate Last Dose  . [START ON 06/29/2017] bupivacaine liposome (EXPAREL) 1.3 % injection 266 mg  20 mL Infiltration On Call to OR Michael Boston, MD      . Derrill Memo ON 06/29/2017] ceFAZolin (ANCEF) IVPB 2g/100 mL premix  2 g Intravenous On Call to OR Michael Boston, MD       And  . Derrill Memo ON 06/29/2017] metroNIDAZOLE (FLAGYL) IVPB 500 mg  500 mg Intravenous On Call  to OR Michael Boston, MD      . Chlorhexidine Gluconate Cloth 2 % PADS 6 each  6 each Topical Once Alphonsa Overall, MD       And  . Chlorhexidine Gluconate Cloth 2 % PADS 6 each  6 each Topical Once Alphonsa Overall, MD      . lactated ringers infusion   Intravenous Continuous Catalina Gravel, MD         No Known Allergies  BP 125/73   Pulse 67   Temp 98.9 F (37.2 C) (Oral)   Resp 16   Ht 5\' 9"  (1.753 m)   Wt 101.2 kg (223 lb)   SpO2 96%   BMI 32.93 kg/m   Labs: Results for orders placed or performed during the hospital encounter of 06/28/17 (from the past 48 hour(s))   I-STAT, chem 8     Status: Abnormal   Collection Time: 06/28/17  2:13 PM  Result Value Ref Range   Sodium 140 135 - 145 mmol/L   Potassium 4.5 3.5 - 5.1 mmol/L   Chloride 107 101 - 111 mmol/L   BUN 25 (H) 6 - 20 mg/dL   Creatinine, Ser 1.20 (H) 0.44 - 1.00 mg/dL   Glucose, Bld 95 65 - 99 mg/dL   Calcium, Ion 1.34 1.15 - 1.40 mmol/L   TCO2 27 22 - 32 mmol/L   Hemoglobin 13.3 12.0 - 15.0 g/dL   HCT 39.0 36.0 - 46.0 %    Imaging / Studies: No results found.   Miranda Harris, M.D., F.A.C.S. Gastrointestinal and Minimally Invasive Surgery [facility] [address] [place], [place] [address] [phone] Main / Paging  06/28/2017 2:35 PM    Miranda Harris C.

## 2017-06-29 ENCOUNTER — Encounter (HOSPITAL_BASED_OUTPATIENT_CLINIC_OR_DEPARTMENT_OTHER): Payer: Self-pay | Admitting: Surgery

## 2017-09-01 NOTE — Progress Notes (Deleted)
BH MD/PA/NP OP Progress Note  09/01/2017 10:04 AM Miranda Harris  MRN:  [id number]  Chief Complaint:  HPI:  - Since the last appointment, patient was admitted for LIFT REPAIR INTERSPHINCTERIC PERIRECTAL FISTULA, EXTERNAL HEMORRHOIDECTOMY    Visit Diagnosis: No diagnosis found.  Past Psychiatric History:  I have reviewed the patient's psychiatry history in detail and updated the patient record. Outpatient: Used to see Dr. Harriette Bouillon in [place] and [facility] Candis [facility] -She reports history of acting inappropriately at age 37, when she was admitted for 6 weeks; she was diagnosed manic depression. She had another time of "manic episode" doing "inappropriate things" one year after she had delivery. She denies any manic episode since then and reports feeling mostly depressed.  Psychiatry admission: once at age 63 for "manic depression" Previous suicide attempt: denies  Past trials of medication: Sertraline, Duloxetine, Wellbutrin. Abilify (flu like symptoms), Haldol (EPS), Thorazine (EPS), Stelazine, Lithium, Lunesta (sedation), Ambien (sleepwalking, driving, eating), Trazodone (excessive sedation), Xanax History of violence: denies  Past Medical History:  Past Medical History:  Diagnosis Date  . Bipolar disorder ([place])   . Elevated serum creatinine    related to lithium use  . Helicobacter pylori (H. pylori) infection    pt denies  . Hypothyroidism   . Perirectal fistula   . Thyroid disease   . Trochanteric bursitis of right hip     Past Surgical History:  Procedure Laterality Date  . EVALUATION UNDER ANESTHESIA WITH HEMORRHOIDECTOMY N/A 06/28/2017   Procedure: EXAM UNDER ANESTHESIA WITH EXTERNAL HEMORRHOIDECTOMY AND REPAIR OF PERIRECTAL FISTULA;  Surgeon: Michael Boston, MD;  Location: [facility];  Service: General;  Laterality: N/A;  . HERNIA REPAIR  5-09 yrs ago   umbilical hernia  . INCISION AND DRAINAGE PERIRECTAL ABSCESS N/A 04/18/2017   Procedure:  IRRIGATION AND DEBRIDEMENT PERIRECTAL ABSCESS, placement of seton, sigmoidoscopy;  Surgeon: Excell Seltzer, MD;  Location: [facility];  Service: General;  Laterality: N/A;  . LAPAROSCOPIC APPENDECTOMY    . VAGINAL HYSTERECTOMY Bilateral    complete  . WISDOM TOOTH EXTRACTION      Family Psychiatric History:  I have reviewed the patient's family history in detail and updated the patient record. Patient endorse mother has bipolar-like symptoms but never diagnosed. Her father attempted suicide.     Family History:  Family History  Problem Relation Age of Onset  . Anxiety disorder Brother     Social History:  Social History   Socioeconomic History  . Marital status: Married    Spouse name: Not on file  . Number of children: Not on file  . Years of education: Not on file  . Highest education level: Not on file  Social Needs  . Financial resource strain: Not on file  . Food insecurity - worry: Not on file  . Food insecurity - inability: Not on file  . Transportation needs - medical: Not on file  . Transportation needs - non-medical: Not on file  Occupational History  . Not on file  Tobacco Use  . Smoking status: Former Smoker    Packs/day: 1.00    Years: 30.00    Pack years: 30.00    Types: Cigarettes  . Smokeless tobacco: Never Used  . Tobacco comment: quit 2008  Substance and Sexual Activity  . Alcohol use: No    Alcohol/week: 0.0 oz    Comment: Occasional use  . Drug use: No  . Sexual activity: Yes    Partners: Male    Birth control/protection: Surgical  Other Topics Concern  . Not on file  Social History Narrative  . Not on file    Allergies: No Known Allergies  Metabolic Disorder Labs: Lab Results  Component Value Date   HGBA1C 5.4 06/10/2015   MPG 108 06/10/2015   No results found for: PROLACTIN No results found for: CHOL, TRIG, HDL, CHOLHDL, VLDL, LDLCALC No results found for: TSH  Therapeutic Level Labs: Lab Results  Component Value Date    LITHIUM 0.80 06/10/2015   No results found for: VALPROATE No components found for:  CBMZ  Current Medications: Current Outpatient Medications  Medication Sig Dispense Refill  . Calcium Carbonate-Vitamin D (CALTRATE 600+D PO) Take 2 tablets by mouth daily.    . hydrocortisone (ANUSOL-HC) 2.5 % rectal cream Place 1 application rectally 2 (two) times daily. Apply around anus for irritated & painful hemorrhoids 15 g 2  . levothyroxine (SYNTHROID, LEVOTHROID) 137 MCG tablet Take 137 mcg by mouth daily before breakfast.    . lithium 600 MG capsule Take 1 capsule (600 mg total) by mouth 2 (two) times daily with a meal. 180 capsule 1  . Multiple Vitamin (MULTIVITAMIN WITH MINERALS) TABS tablet Take 1 tablet by mouth daily.    . Omega-3 Fatty Acids (FISH OIL) 1000 MG CAPS Take 2,000 mg by mouth daily.    [name] oxyCODONE (OXY IR/ROXICODONE) 5 MG immediate release tablet Take 1-2 tablets (5-10 mg total) by mouth every 4 (four) hours as needed for moderate pain, severe pain or breakthrough pain. 40 tablet 0  . QUERCETIN PO Take 1 capsule by mouth 2 (two) times daily.    . QUEtiapine (SEROQUEL) 100 MG tablet Take 1.5 tablets (150 mg total) by mouth at bedtime. 135 tablet 1  . venlafaxine XR (EFFEXOR-XR) 150 MG 24 hr capsule Take total of 225 mg (150+75) mg 90 capsule 1  . venlafaxine XR (EFFEXOR-XR) 75 MG 24 hr capsule Take total of 225 mg (150+75) mg 90 capsule 1   No current facility-administered medications for this visit.      Musculoskeletal: Strength & Muscle Tone: within normal limits Gait & Station: normal Patient leans: N/A  Psychiatric Specialty Exam: ROS  There were no vitals taken for this visit.There is no height or weight on file to calculate BMI.  General Appearance: Fairly Groomed  Eye Contact:  Good  Speech:  Clear and Coherent  Volume:  Normal  Mood:  {BHH MOOD:22306}  Affect:  {Affect (PAA):22687}  Thought Process:  Coherent and Goal Directed  Orientation:  Full (Time, Place,  and Person)  Thought Content: Logical   Suicidal Thoughts:  {ST/HT (PAA):22692}  Homicidal Thoughts:  {ST/HT (PAA):22692}  Memory:  Immediate;   Good Recent;   Good Remote;   Good  Judgement:  {Judgement (PAA):22694}  Insight:  {Insight (PAA):22695}  Psychomotor Activity:  Normal  Concentration:  Concentration: Good and Attention Span: Good  Recall:  Good  Fund of Knowledge: Good  Language: Good  Akathisia:  No  Handed:  Right  AIMS (if indicated): not done  Assets:  Communication Skills Desire for Improvement  ADL's:  Intact  Cognition: WNL  Sleep:  {BHH GOOD/FAIR/POOR:22877}   Screenings:   Assessment and Plan:  Miranda Harris is a 56 y.o. year old female with a history of bipolar I disorder, anxiety,  alcohol use disorder in sustained remission, hypothyroidism, who presents for follow up appointment for No diagnosis found.  # Bipolar I disorder  There has been overall improvement in her mood symptoms/insomnia on current medication (  She does not endorse any concern about her sleep architecture ). Will continue Effexor for depression. Will continue quetiapine for bipolar disorder, insomnia while monitoring metabolic side effect. Will continue lithium as mood stabilization. Explore her life value. Discussed behavioral activation.   # r/o cognitive impairment Patient endorses difficulty with concentration which has worsened gradually. ADL/IADL independent. Will evaluate with MOCA at the next encounter.  Plan:  1. Continue venlafaxine 225 mg (150+75 mg) 2. Continue quetiapine 150 mg at night 3. Continue lithium 600 mg twice a day 4. Return to clinic in four months for 30 mins (TSH wnl, creat 1.13, Lithium 1.2, checked 12/2016)  The patient demonstrates the following risk factors for suicide: Chronic risk factorsfor suicide include psychiatric disorder /bipolar disorder,history of substance use disorder, family history of suicide attempt. Acute risk factorsfor suicide  includenone.Protective factorsfor this patient include positive social support, responsibility to others (family), coping skills, hope for the future. Although patient reports passive SI, she adamantly denies any intent and has had no previous suicide attempt.Considering these factors, the overall suicide risk at this point appears to be low. Discussed emergency resources including calling 911, suicide crisis line and coming to ED. Patient is advised to call the clinic for earlier appointment if any worsening in her symptoms.     [name], MD 09/01/2017, 10:04 AM

## 2017-09-05 ENCOUNTER — Ambulatory Visit (HOSPITAL_COMMUNITY): Payer: Self-pay | Admitting: Psychiatry

## 2017-10-02 NOTE — Progress Notes (Deleted)
BH MD/PA/NP OP Progress Note  10/02/2017 10:35 AM Miranda Harris  MRN:  [id number]  Chief Complaint:  HPI:  - Since the last appointment, patient was admitted for LIFT REPAIR INTERSPHINCTERIC PERIRECTAL FISTULA, EXTERNAL HEMORRHOIDECTOMY    Visit Diagnosis: No diagnosis found.  Past Psychiatric History: I have reviewed the patient's psychiatry history in detail and updated the patient record. Outpatient: Used to see Dr. Harriette Bouillon in [place] and [facility] -She reports history of acting inappropriately at age 57, when she was admitted for 6 weeks; she was diagnosed manic depression. She had another time of "manic episode" doing "inappropriate things" one year after she had delivery. She denies any manic episode since then and reports feeling mostly depressed.  Psychiatry admission: once at age 57 for "manic depression" Previous suicide attempt: denies  Past trials of medication: Sertraline, Duloxetine, Wellbutrin. Abilify (flu like symptoms), Haldol (EPS), Thorazine (EPS), Stelazine, Lithium, Lunesta (sedation), Ambien (sleepwalking, driving, eating), Trazodone (excessive sedation), Xanax History of violence: denies   Past Medical History:  Past Medical History:  Diagnosis Date  . Bipolar disorder ([place])   . Elevated serum creatinine    related to lithium use  . Helicobacter pylori (H. pylori) infection    pt denies  . Hypothyroidism   . Perirectal fistula   . Thyroid disease   . Trochanteric bursitis of right hip     Past Surgical History:  Procedure Laterality Date  . EVALUATION UNDER ANESTHESIA WITH HEMORRHOIDECTOMY N/A 06/28/2017   Procedure: EXAM UNDER ANESTHESIA WITH EXTERNAL HEMORRHOIDECTOMY AND REPAIR OF PERIRECTAL FISTULA;  Surgeon: Michael Boston, MD;  Location: [facility];  Service: General;  Laterality: N/A;  . HERNIA REPAIR  5-00 yrs ago   umbilical hernia  . INCISION AND DRAINAGE PERIRECTAL ABSCESS N/A 04/18/2017   Procedure:  IRRIGATION AND DEBRIDEMENT PERIRECTAL ABSCESS, placement of seton, sigmoidoscopy;  Surgeon: Excell Seltzer, MD;  Location: [facility];  Service: General;  Laterality: N/A;  . LAPAROSCOPIC APPENDECTOMY    . VAGINAL HYSTERECTOMY Bilateral    complete  . WISDOM TOOTH EXTRACTION      Family Psychiatric History: I have reviewed the patient's family history in detail and updated the patient record. Patient endorse mother has bipolar-like symptoms but never diagnosed. Her father attempted suicide.     Family History:  Family History  Problem Relation Age of Onset  . Anxiety disorder Brother     Social History:  Social History   Socioeconomic History  . Marital status: Married    Spouse name: Not on file  . Number of children: Not on file  . Years of education: Not on file  . Highest education level: Not on file  Social Needs  . Financial resource strain: Not on file  . Food insecurity - worry: Not on file  . Food insecurity - inability: Not on file  . Transportation needs - medical: Not on file  . Transportation needs - non-medical: Not on file  Occupational History  . Not on file  Tobacco Use  . Smoking status: Former Smoker    Packs/day: 1.00    Years: 30.00    Pack years: 30.00    Types: Cigarettes  . Smokeless tobacco: Never Used  . Tobacco comment: quit 2008  Substance and Sexual Activity  . Alcohol use: No    Alcohol/week: 0.0 oz    Comment: Occasional use  . Drug use: No  . Sexual activity: Yes    Partners: Male    Birth control/protection: Surgical  Other Topics Concern  . Not on file  Social History Narrative  . Not on file    Allergies: No Known Allergies  Metabolic Disorder Labs: Lab Results  Component Value Date   HGBA1C 5.4 06/10/2015   MPG 108 06/10/2015   No results found for: PROLACTIN No results found for: CHOL, TRIG, HDL, CHOLHDL, VLDL, LDLCALC No results found for: TSH  Therapeutic Level Labs: Lab Results  Component Value Date    LITHIUM 0.80 06/10/2015   No results found for: VALPROATE No components found for:  CBMZ  Current Medications: Current Outpatient Medications  Medication Sig Dispense Refill  . Calcium Carbonate-Vitamin D (CALTRATE 600+D PO) Take 2 tablets by mouth daily.    . hydrocortisone (ANUSOL-HC) 2.5 % rectal cream Place 1 application rectally 2 (two) times daily. Apply around anus for irritated & painful hemorrhoids 15 g 2  . levothyroxine (SYNTHROID, LEVOTHROID) 137 MCG tablet Take 137 mcg by mouth daily before breakfast.    . lithium 600 MG capsule Take 1 capsule (600 mg total) by mouth 2 (two) times daily with a meal. 180 capsule 1  . Multiple Vitamin (MULTIVITAMIN WITH MINERALS) TABS tablet Take 1 tablet by mouth daily.    . Omega-3 Fatty Acids (FISH OIL) 1000 MG CAPS Take 2,000 mg by mouth daily.    [name] oxyCODONE (OXY IR/ROXICODONE) 5 MG immediate release tablet Take 1-2 tablets (5-10 mg total) by mouth every 4 (four) hours as needed for moderate pain, severe pain or breakthrough pain. 40 tablet 0  . QUERCETIN PO Take 1 capsule by mouth 2 (two) times daily.    . QUEtiapine (SEROQUEL) 100 MG tablet Take 1.5 tablets (150 mg total) by mouth at bedtime. 135 tablet 1  . venlafaxine XR (EFFEXOR-XR) 150 MG 24 hr capsule Take total of 225 mg (150+75) mg 90 capsule 1  . venlafaxine XR (EFFEXOR-XR) 75 MG 24 hr capsule Take total of 225 mg (150+75) mg 90 capsule 1   No current facility-administered medications for this visit.      Musculoskeletal: Strength & Muscle Tone: within normal limits Gait & Station: normal Patient leans: N/A  Psychiatric Specialty Exam: ROS  There were no vitals taken for this visit.There is no height or weight on file to calculate BMI.  General Appearance: Fairly Groomed  Eye Contact:  Good  Speech:  Clear and Coherent  Volume:  Normal  Mood:  {BHH MOOD:22306}  Affect:  {Affect (PAA):22687}  Thought Process:  Coherent and Goal Directed  Orientation:  Full (Time, Place,  and Person)  Thought Content: Logical   Suicidal Thoughts:  {ST/HT (PAA):22692}  Homicidal Thoughts:  {ST/HT (PAA):22692}  Memory:  Immediate;   Good Recent;   Good Remote;   Good  Judgement:  {Judgement (PAA):22694}  Insight:  {Insight (PAA):22695}  Psychomotor Activity:  Normal  Concentration:  Concentration: Good and Attention Span: Good  Recall:  Good  Fund of Knowledge: Good  Language: Good  Akathisia:  No  Handed:  Right  AIMS (if indicated): not done  Assets:  Communication Skills Desire for Improvement  ADL's:  Intact  Cognition: WNL  Sleep:  {BHH GOOD/FAIR/POOR:22877}   Screenings:   Assessment and Plan:  Tamora Huneke is a 57 y.o. year old female with a history of bipolar I disorder, anxiety, alcohol use disorder in sustained remissino, hypothyroidism, who presents for follow up appointment for No diagnosis found.  # Bipolar I disorder  There has been overall improvement in her mood symptoms/insomnia on current medication (She  does not endorse any concern about her sleep architecture ). Will continue Effexor for depression. Will continue quetiapine for bipolar disorder, insomnia while monitoring metabolic side effect. Will continue lithium as mood stabilization. Explore her life value. Discussed behavioral activation.   # r/o cognitive impairment Patient endorses difficulty with concentration which has worsened gradually. ADL/IADL independent. Will evaluate with MOCA at the next encounter.  Plan:  1. Continue venlafaxine 225 mg (150+75 mg) 2. Continue quetiapine 150 mg at night 3. Continue lithium 600 mg twice a day 4. Return to clinic in four months for 30 mins (TSH wnl, creat 1.13, Lithium 1.2, checked 12/2016)  The patient demonstrates the following risk factors for suicide: Chronic risk factorsfor suicide include psychiatric disorder /bipolar disorder,history of substance use disorder, family history of suicide attempt. Acute risk factorsfor suicide  includenone.Protective factorsfor this patient include positive social support, responsibility to others (family), coping skills, hope for the future. Although patient reports passive SI, she adamantly denies any intent and has had no previous suicide attempt.Considering these factors, the overall suicide risk at this point appears to be low. Discussed emergency resources including calling 911, suicide crisis line and coming to ED. Patient is advised to call the clinic for earlier appointment if any worsening in her symptoms.    [name], MD 10/02/2017, 10:35 AM

## 2017-10-04 ENCOUNTER — Ambulatory Visit (HOSPITAL_COMMUNITY): Payer: Self-pay | Admitting: Psychiatry

## 2017-10-16 NOTE — Progress Notes (Signed)
BH MD/PA/NP OP Progress Note  10/19/2017 12:11 PM Miranda Harris  MRN:  [id number]  Chief Complaint:  Chief Complaint    Follow-up; Manic Behavior; Anxiety; Depression     HPI:  She presents for follow up appointment for bipolar disorder. She states that she is not doing well. She is concerned about upcoming surgery for cataract in her left eye. She also complains of right hip pain and is planning to see her PCP. She states that she feels down about her physical changes, stating that her parents were healthier.  She is concerned about her memory loss; although she did not have issues when she was in [place],  working as an Psychologist, occupational assistance for many years, she has not been able to keep her job as she makes mistakes and "missing details". She denies difficulty with sustaining attention. She feels good that her son is getting married in September.  She has feels sleep.  She feels down and reports mild anhedonia.  She reports mildly fatigued.  She denies SI.  She denies decreased need for sleep or euphoria she denies increased goal-directed activity.    Functional Status Instrumental Activities of Daily Living (IADLs):  Regena Delucchi is independent in the following: medications, driving Requires assistance with the following: finances, (tends to forget things in shopping)  Activities of Daily Living (ADL's):  Leenah Seidner is independent in the following: bathing and hygiene, feeding, continence, grooming and toileting, walking  Visit Diagnosis:    ICD-10-CM   1. Bipolar I disorder, most recent episode depressed ([place]) F31.30     Past Psychiatric History:  I have reviewed the patient's psychiatry history in detail and updated the patient record. Outpatient: Used to see Dr. Harriette Bouillon in [place] and [facility] -She reports history of acting inappropriately at age 26, when she was admitted for 6 weeks; she was diagnosed manic depression. She had another time of "manic  episode" doing "inappropriate things" one year after she had delivery. She denies any manic episode since then and reports feeling mostly depressed.  Psychiatry admission: once at age 64 for "manic depression" Previous suicide attempt: denies  Past trials of medication: Sertraline, Duloxetine, Wellbutrin. Abilify (flu like symptoms), Haldol (EPS), Thorazine (EPS), Stelazine, Lithium, Lunesta (sedation), Ambien (sleepwalking, driving, eating), Trazodone (excessive sedation), Xanax History of violence: denies  Past Medical History:  Past Medical History:  Diagnosis Date  . Bipolar disorder ([place])   . Elevated serum creatinine    related to lithium use  . Helicobacter pylori (H. pylori) infection    pt denies  . Hypothyroidism   . Perirectal fistula   . Thyroid disease   . Trochanteric bursitis of right hip     Past Surgical History:  Procedure Laterality Date  . EVALUATION UNDER ANESTHESIA WITH HEMORRHOIDECTOMY N/A 06/28/2017   Procedure: EXAM UNDER ANESTHESIA WITH EXTERNAL HEMORRHOIDECTOMY AND REPAIR OF PERIRECTAL FISTULA;  Surgeon: Michael Boston, MD;  Location: [facility];  Service: General;  Laterality: N/A;  . HERNIA REPAIR  3-81 yrs ago   umbilical hernia  . INCISION AND DRAINAGE PERIRECTAL ABSCESS N/A 04/18/2017   Procedure: IRRIGATION AND DEBRIDEMENT PERIRECTAL ABSCESS, placement of seton, sigmoidoscopy;  Surgeon: Excell Seltzer, MD;  Location: [facility];  Service: General;  Laterality: N/A;  . LAPAROSCOPIC APPENDECTOMY    . VAGINAL HYSTERECTOMY Bilateral    complete  . WISDOM TOOTH EXTRACTION      Family Psychiatric History:  I have reviewed the patient's family history in detail and updated  the patient record. Patient endorse mother has bipolar-like symptoms but never diagnosed. Her father attempted suicide.    Family History:  Family History  Problem Relation Age of Onset  . Anxiety disorder Brother     Social History:  Social History    Socioeconomic History  . Marital status: Married    Spouse name: None  . Number of children: None  . Years of education: None  . Highest education level: None  Social Needs  . Financial resource strain: None  . Food insecurity - worry: None  . Food insecurity - inability: None  . Transportation needs - medical: None  . Transportation needs - non-medical: None  Occupational History  . None  Tobacco Use  . Smoking status: Former Smoker    Packs/day: 1.00    Years: 30.00    Pack years: 30.00    Types: Cigarettes  . Smokeless tobacco: Never Used  . Tobacco comment: quit 2008  Substance and Sexual Activity  . Alcohol use: No    Alcohol/week: 0.0 oz    Comment: Occasional use  . Drug use: No  . Sexual activity: Yes    Partners: Male    Birth control/protection: Surgical  Other Topics Concern  . None  Social History Narrative  . None    Allergies: No Known Allergies  Metabolic Disorder Labs: Lab Results  Component Value Date   HGBA1C 5.4 06/10/2015   MPG 108 06/10/2015   No results found for: PROLACTIN No results found for: CHOL, TRIG, HDL, CHOLHDL, VLDL, LDLCALC No results found for: TSH  Therapeutic Level Labs: Lab Results  Component Value Date   LITHIUM 0.80 06/10/2015   No results found for: VALPROATE No components found for:  CBMZ  Current Medications: Current Outpatient Medications  Medication Sig Dispense Refill  . Calcium Carbonate-Vitamin D (CALTRATE 600+D PO) Take 2 tablets by mouth daily.    . hydrocortisone (ANUSOL-HC) 2.5 % rectal cream Place 1 application rectally 2 (two) times daily. Apply around anus for irritated & painful hemorrhoids 15 g 2  . levothyroxine (SYNTHROID, LEVOTHROID) 137 MCG tablet Take 137 mcg by mouth daily before breakfast.    . lithium 600 MG capsule Take 1 capsule (600 mg total) by mouth 2 (two) times daily with a meal. 180 capsule 1  . Multiple Vitamin (MULTIVITAMIN WITH MINERALS) TABS tablet Take 1 tablet by mouth  daily.    . Omega-3 Fatty Acids (FISH OIL) 1000 MG CAPS Take 2,000 mg by mouth daily.    [name] oxyCODONE (OXY IR/ROXICODONE) 5 MG immediate release tablet Take 1-2 tablets (5-10 mg total) by mouth every 4 (four) hours as needed for moderate pain, severe pain or breakthrough pain. 40 tablet 0  . QUERCETIN PO Take 1 capsule by mouth 2 (two) times daily.    . QUEtiapine (SEROQUEL) 100 MG tablet Take 1.5 tablets (150 mg total) by mouth at bedtime. 135 tablet 1  . venlafaxine XR (EFFEXOR-XR) 150 MG 24 hr capsule Take total of 225 mg (150+75) mg 90 capsule 1  . venlafaxine XR (EFFEXOR-XR) 75 MG 24 hr capsule Take total of 225 mg (150+75) mg 90 capsule 1   No current facility-administered medications for this visit.      Musculoskeletal: Strength & Muscle Tone: within normal limits Gait & Station: normal Patient leans: N/A  Psychiatric Specialty Exam: Review of Systems  Neurological: Negative for loss of consciousness.  Psychiatric/Behavioral: Positive for depression and memory loss. Negative for hallucinations, substance abuse and suicidal ideas. The patient  is nervous/anxious.   All other systems reviewed and are negative.   Blood pressure 137/87, pulse 73, height 5\' 9"  (1.753 m), weight 219 lb (99.3 kg), SpO2 100 %.Body mass index is 32.34 kg/m.  General Appearance: Fairly Groomed  Eye Contact:  Good  Speech:  Clear and Coherent  Volume:  Normal  Mood:  Anxious  Affect:  Appropriate, Congruent, Restricted and down  Thought Process:  Coherent and Goal Directed  Orientation:  Full (Time, Place, and Person)  Thought Content: Logical   Suicidal Thoughts:  No  Homicidal Thoughts:  No  Memory:  Immediate;   Good  Judgement:  Good  Insight:  Fair  Psychomotor Activity:  Normal  Concentration:  Concentration: Good and Attention Span: Good  Recall:  [place] of Knowledge: Good  Language: Good  Akathisia:  No  Handed:  Right  AIMS (if indicated): not done  Assets:  Communication  Skills Desire for Improvement  ADL's:  Intact  Cognition: WNL  Sleep:  Fair   Screenings: MOCA 24/30 (-1 for visuospatial, -2 for attention, -3 for delayed recall)  Assessment and Plan:  Jaeleen Inzunza is a 57 y.o. year old female with a history of bipolar I disorder, anxiety,  alcohol use disorder in sustained remission, hypothyroidism , who presents for follow up appointment for Bipolar I disorder, most recent episode depressed ([place])  # Bipolar I disorder Patient reports slightly worsening in her neurovegetative symptoms since the last appointment.  She is demoralized by her physical condition.  Will continue Effexor for depression.  Will continue quetiapine for bipolar disorder while monitoring made metabolic side effect.  Will continue lithium for mood stabilization. Validated her concern.  Discussed behavioral activation.   # Mild neurocognitive disorder Patient endorses difficulty with concentration and memory loss. IADL partially independent. She would like to hold off neuropsych evaluation at this time. Will discuss as needed.   Plan:  I have reviewed and updated plans as below 1. Continue venlafaxine 225 mg (150+75 mg) 2. Continue quetiapine 150 mg at night 3. Continue lithium 600 mg twice a day 4. Return to clinic in four months for 30 mins (TSH wnl, creat 1.13, Lithium 1.2, checked 12/2016)  The patient demonstrates the following risk factors for suicide: Chronic risk factorsfor suicide include psychiatric disorder /bipolar disorder,history of substance use disorder, family history of suicide attempt. Acute risk factorsfor suicide includenone.Protective factorsfor this patient include positive social support, responsibility to others (family), coping skills, hope for the future. Although patient reports passive SI, she adamantly denies any intent and has had no previous suicide attempt.Considering these factors, the overall suicide risk at this point appears to be low.  Discussed emergency resources including calling 911, suicide crisis line and coming to ED. Patient is advised to call the clinic for earlier appointment if any worsening in her symptoms.  The duration of this appointment visit was 30 minutes of face-to-face time with the patient.  Greater than 50% of this time was spent in counseling, explanation of  diagnosis, planning of further management, and coordination of care. [name], MD 10/19/2017, 12:11 PM

## 2017-10-19 ENCOUNTER — Ambulatory Visit (INDEPENDENT_AMBULATORY_CARE_PROVIDER_SITE_OTHER): Payer: 59 | Admitting: Psychiatry

## 2017-10-19 ENCOUNTER — Encounter (HOSPITAL_COMMUNITY): Payer: Self-pay | Admitting: Psychiatry

## 2017-10-19 VITALS — BP 137/87 | HR 73 | Ht 69.0 in | Wt 219.0 lb

## 2017-10-19 DIAGNOSIS — R450 Nervousness: Secondary | ICD-10-CM

## 2017-10-19 DIAGNOSIS — R413 Other amnesia: Secondary | ICD-10-CM

## 2017-10-19 DIAGNOSIS — Z818 Family history of other mental and behavioral disorders: Secondary | ICD-10-CM

## 2017-10-19 DIAGNOSIS — F3130 Bipolar disorder, current episode depressed, mild or moderate severity, unspecified: Secondary | ICD-10-CM | POA: Diagnosis not present

## 2017-10-19 DIAGNOSIS — M25551 Pain in right hip: Secondary | ICD-10-CM

## 2017-10-19 DIAGNOSIS — Z87891 Personal history of nicotine dependence: Secondary | ICD-10-CM

## 2017-10-19 DIAGNOSIS — G3184 Mild cognitive impairment, so stated: Secondary | ICD-10-CM

## 2017-10-19 DIAGNOSIS — H269 Unspecified cataract: Secondary | ICD-10-CM

## 2017-10-19 DIAGNOSIS — F419 Anxiety disorder, unspecified: Secondary | ICD-10-CM

## 2017-10-19 MED ORDER — LITHIUM CARBONATE 600 MG PO CAPS: 600.0000 mg | ORAL_CAPSULE | Freq: Two times a day (BID) | ORAL | 1 refills | Status: DC

## 2017-10-19 MED ORDER — VENLAFAXINE HCL ER 75 MG PO CP24: ORAL_CAPSULE | ORAL | 1 refills | Status: DC

## 2017-10-19 MED ORDER — QUETIAPINE FUMARATE 100 MG PO TABS: 150.0000 mg | ORAL_TABLET | Freq: Every day | ORAL | 1 refills | Status: DC

## 2017-10-19 MED ORDER — VENLAFAXINE HCL ER 150 MG PO CP24: ORAL_CAPSULE | ORAL | 1 refills | Status: DC

## 2017-10-19 NOTE — Patient Instructions (Addendum)
1. Continue venlafaxine 225 mg (150+75 mg) 2. Continue quetiapine 150 mg at night 3. Continue lithium 600 mg twice a day 4. Return to clinic in four months for 30 mins

## 2018-02-14 NOTE — Progress Notes (Deleted)
BH MD/PA/NP OP Progress Note  02/14/2018 12:04 PM Miranda Harris  MRN:  [id number]  Chief Complaint:  HPI: *** Visit Diagnosis: No diagnosis found.  Past Psychiatric History:  Please see initial evaluation for full details. I have reviewed the history. No updates at this time.    Outpatient: Used to see Dr. Harriette Bouillon in [place] and [facility] -She reports history of acting inappropriately at age 57, when she was admitted for 6 weeks; she was diagnosed manic depression. She had another time of "manic episode" doing "inappropriate things" one year after she had delivery. She denies any manic episode since then and reports feeling mostly depressed.  Psychiatry admission: once at age 54 for "manic depression" Previous suicide attempt: denies  Past trials of medication: Sertraline, Duloxetine, Wellbutrin. Abilify (flu like symptoms), Haldol (EPS), Thorazine (EPS), Stelazine, Lithium, Lunesta (sedation), Ambien (sleepwalking, driving, eating), Trazodone (excessive sedation), Xanax History of violence: denies   Past Medical History:  Past Medical History:  Diagnosis Date  . Bipolar disorder ([place])   . Elevated serum creatinine    related to lithium use  . Helicobacter pylori (H. pylori) infection    pt denies  . Hypothyroidism   . Perirectal fistula   . Thyroid disease   . Trochanteric bursitis of right hip     Past Surgical History:  Procedure Laterality Date  . EVALUATION UNDER ANESTHESIA WITH HEMORRHOIDECTOMY N/A 06/28/2017   Procedure: EXAM UNDER ANESTHESIA WITH EXTERNAL HEMORRHOIDECTOMY AND REPAIR OF PERIRECTAL FISTULA;  Surgeon: Michael Boston, MD;  Location: [facility];  Service: General;  Laterality: N/A;  . HERNIA REPAIR  3-61 yrs ago   umbilical hernia  . INCISION AND DRAINAGE PERIRECTAL ABSCESS N/A 04/18/2017   Procedure: IRRIGATION AND DEBRIDEMENT PERIRECTAL ABSCESS, placement of seton, sigmoidoscopy;  Surgeon: Excell Seltzer, MD;   Location: [facility];  Service: General;  Laterality: N/A;  . LAPAROSCOPIC APPENDECTOMY    . VAGINAL HYSTERECTOMY Bilateral    complete  . WISDOM TOOTH EXTRACTION      Family Psychiatric History: Please see initial evaluation for full details. I have reviewed the history. No updates at this time.     Family History:  Family History  Problem Relation Age of Onset  . Anxiety disorder Brother     Social History:  Social History   Socioeconomic History  . Marital status: Married    Spouse name: Not on file  . Number of children: Not on file  . Years of education: Not on file  . Highest education level: Not on file  Occupational History  . Not on file  Social Needs  . Financial resource strain: Not on file  . Food insecurity:    Worry: Not on file    Inability: Not on file  . Transportation needs:    Medical: Not on file    Non-medical: Not on file  Tobacco Use  . Smoking status: Former Smoker    Packs/day: 1.00    Years: 30.00    Pack years: 30.00    Types: Cigarettes  . Smokeless tobacco: Never Used  . Tobacco comment: quit 2008  Substance and Sexual Activity  . Alcohol use: No    Alcohol/week: 0.0 oz    Comment: Occasional use  . Drug use: No  . Sexual activity: Yes    Partners: Male    Birth control/protection: Surgical  Lifestyle  . Physical activity:    Days per week: Not on file    Minutes per session: Not on  file  . Stress: Not on file  Relationships  . Social connections:    Talks on phone: Not on file    Gets together: Not on file    Attends religious service: Not on file    Active member of club or organization: Not on file    Attends meetings of clubs or organizations: Not on file    Relationship status: Not on file  Other Topics Concern  . Not on file  Social History Narrative  . Not on file    Allergies: No Known Allergies  Metabolic Disorder Labs: Lab Results  Component Value Date   HGBA1C 5.4 06/10/2015   MPG 108 06/10/2015   No  results found for: PROLACTIN No results found for: CHOL, TRIG, HDL, CHOLHDL, VLDL, LDLCALC No results found for: TSH  Therapeutic Level Labs: Lab Results  Component Value Date   LITHIUM 0.80 06/10/2015   No results found for: VALPROATE No components found for:  CBMZ  Current Medications: Current Outpatient Medications  Medication Sig Dispense Refill  . Calcium Carbonate-Vitamin D (CALTRATE 600+D PO) Take 2 tablets by mouth daily.    . hydrocortisone (ANUSOL-HC) 2.5 % rectal cream Place 1 application rectally 2 (two) times daily. Apply around anus for irritated & painful hemorrhoids 15 g 2  . levothyroxine (SYNTHROID, LEVOTHROID) 137 MCG tablet Take 137 mcg by mouth daily before breakfast.    . lithium 600 MG capsule Take 1 capsule (600 mg total) by mouth 2 (two) times daily with a meal. 180 capsule 1  . Multiple Vitamin (MULTIVITAMIN WITH MINERALS) TABS tablet Take 1 tablet by mouth daily.    . Omega-3 Fatty Acids (FISH OIL) 1000 MG CAPS Take 2,000 mg by mouth daily.    [name] oxyCODONE (OXY IR/ROXICODONE) 5 MG immediate release tablet Take 1-2 tablets (5-10 mg total) by mouth every 4 (four) hours as needed for moderate pain, severe pain or breakthrough pain. 40 tablet 0  . QUERCETIN PO Take 1 capsule by mouth 2 (two) times daily.    . QUEtiapine (SEROQUEL) 100 MG tablet Take 1.5 tablets (150 mg total) by mouth at bedtime. 135 tablet 1  . venlafaxine XR (EFFEXOR-XR) 150 MG 24 hr capsule Take total of 225 mg (150+75) mg 90 capsule 1  . venlafaxine XR (EFFEXOR-XR) 75 MG 24 hr capsule Take total of 225 mg (150+75) mg 90 capsule 1   No current facility-administered medications for this visit.      Musculoskeletal: Strength & Muscle Tone: within normal limits Gait & Station: normal Patient leans: N/A  Psychiatric Specialty Exam: ROS  There were no vitals taken for this visit.There is no height or weight on file to calculate BMI.  General Appearance: Fairly Groomed  Eye Contact:  Good   Speech:  Clear and Coherent  Volume:  Normal  Mood:  {BHH MOOD:22306}  Affect:  {Affect (PAA):22687}  Thought Process:  Coherent  Orientation:  Full (Time, Place, and Person)  Thought Content: Logical   Suicidal Thoughts:  {ST/HT (PAA):22692}  Homicidal Thoughts:  {ST/HT (PAA):22692}  Memory:  Immediate;   Good  Judgement:  {Judgement (PAA):22694}  Insight:  {Insight (PAA):22695}  Psychomotor Activity:  Normal  Concentration:  Concentration: Good and Attention Span: Good  Recall:  Good  Fund of Knowledge: Good  Language: Good  Akathisia:  No  Handed:  Right  AIMS (if indicated): not done  Assets:  Communication Skills Desire for Improvement  ADL's:  Intact  Cognition: WNL  Sleep:  {BHH GOOD/FAIR/POOR:22877}  Screenings: MOCA 24/30 (-1 for visuospatial, -2 for attention, -3 for delayed recall) 09/2017  Assessment and Plan:  Miranda Harris is a 57 y.o. year old female with a history of bipolar I disorder, anxiety,  alcohol use disorder in sustained remission, hypothyroidism, who presents for follow up appointment for No diagnosis found.  # Bipolar I disorder  Patient reports slightly worsening in her neurovegetative symptoms since the last appointment.  She is demoralized by her physical condition.  Will continue Effexor for depression.  Will continue quetiapine for bipolar disorder while monitoring made metabolic side effect.  Will continue lithium for mood stabilization. Validated her concern.  Discussed behavioral activation.   # Mild neurocognitive disorder Patient endorses difficulty with concentration and memory loss. IADL partially independent. She would like to hold off neuropsych evaluation at this time. Will discuss as needed.   Plan:   1. Continue venlafaxine 225 mg (150+75 mg) 2. Continue quetiapine 150 mg at night 3. Continue lithium 600 mg twice a day 4. Return to clinic in four months for 30 mins (TSH wnl, creat 1.13, Lithium 1.2, checked 12/2016)  The  patient demonstrates the following risk factors for suicide: Chronic risk factorsfor suicide include psychiatric disorder /bipolar disorder,history of substance use disorder, family history of suicide attempt. Acute risk factorsfor suicide includenone.Protective factorsfor this patient include positive social support, responsibility to others (family), coping skills, hope for the future. Although patient reports passive SI, she adamantly denies any intent and has had no previous suicide attempt.Considering these factors, the overall suicide risk at this point appears to be low. Discussed emergency resources including calling 911, suicide crisis line and coming to ED. Patient is advised to call the clinic for earlier appointment if any worsening in her symptoms.    [name], MD 02/14/2018, 12:04 PM

## 2018-02-20 ENCOUNTER — Ambulatory Visit (HOSPITAL_COMMUNITY): Payer: Self-pay | Admitting: Psychiatry

## 2018-03-05 ENCOUNTER — Ambulatory Visit (HOSPITAL_COMMUNITY): Payer: Self-pay | Admitting: Psychiatry

## 2018-03-20 NOTE — Progress Notes (Signed)
[facility] MD/PA/NP OP Progress Note  03/21/2018 4:49 PM Miranda Harris  MRN:  [id number]  Chief Complaint:  Chief Complaint    Follow-up; Depression; Other     HPI:  Patient presents for follow-up appointment for bipolar 1 disorder. She states that she has had insomnia, which she attributes to bursitis. She was given pain medication by PCP to see if it is helpful. She has started a job at [employer]. She works for 20 hours per week. She finds it helpful to keep her self doing something as she does not like to be bored. She enjoys doing gardening and making fence in the yard for her dogs. She meets with neighbor family and enjoys it. Although there was a time she felt more depressed than usual, she believes it is because of her insomnia. She also reports struggle about her vision until she had cataract surgery in April. She believes that she had worsening in depression last year when she had anal fistula, and she believes it was more situational. She has not had any manic symptoms for 25 years; she reports history of mania, followed by depression when she was at lower dose of lithium. She denies decreased need for sleep, euphoria. She feels fatigue. She denies SI. She denies anxiety or panic attacks. She is not concerned of memory loss; she is able to function well at work.   Reviewed and updated the following.  Functional Status Instrumental Activities of Daily Living (IADLs):  Miranda Harris is independent in the following: medications, driving Requires assistance with the following: finances, (tends to forget things in shopping)  Activities of Daily Living (ADL's):  Miranda Harris is independent in the following: bathing and hygiene, feeding, continence, grooming and toileting, walking   Wt Readings from Last 3 Encounters:  03/21/18 212 lb (96.2 kg)  10/19/17 219 lb (99.3 kg)  06/28/17 223 lb (101.2 kg)    Visit Diagnosis:    ICD-10-CM   1. Bipolar I disorder, most recent episode depressed ([place])  F31.30     Past Psychiatric History: Please see initial evaluation for full details. I have reviewed the history. No updates at this time.     Past Medical History:  Past Medical History:  Diagnosis Date  . Bipolar disorder ([place])   . Elevated serum creatinine    related to lithium use  . Helicobacter pylori (H. pylori) infection    pt denies  . Hypothyroidism   . Perirectal fistula   . Thyroid disease   . Trochanteric bursitis of right hip     Past Surgical History:  Procedure Laterality Date  . EVALUATION UNDER ANESTHESIA WITH HEMORRHOIDECTOMY N/A 06/28/2017   Procedure: EXAM UNDER ANESTHESIA WITH EXTERNAL HEMORRHOIDECTOMY AND REPAIR OF PERIRECTAL FISTULA;  Surgeon: Michael Boston, MD;  Location: [facility];  Service: General;  Laterality: N/A;  . HERNIA REPAIR  2-03 yrs ago   umbilical hernia  . INCISION AND DRAINAGE PERIRECTAL ABSCESS N/A 04/18/2017   Procedure: IRRIGATION AND DEBRIDEMENT PERIRECTAL ABSCESS, placement of seton, sigmoidoscopy;  Surgeon: Excell Seltzer, MD;  Location: [facility];  Service: General;  Laterality: N/A;  . LAPAROSCOPIC APPENDECTOMY    . VAGINAL HYSTERECTOMY Bilateral    complete  . WISDOM TOOTH EXTRACTION      Family Psychiatric History: Please see initial evaluation for full details. I have reviewed the history. No updates at this time.     Family History:  Family History  Problem Relation Age of Onset  . Anxiety disorder Brother  Social History:  Social History   Socioeconomic History  . Marital status: Married    Spouse name: Not on file  . Number of children: Not on file  . Years of education: Not on file  . Highest education level: Not on file  Occupational History  . Not on file  Social Needs  . Financial resource strain: Not on file  . Food insecurity:    Worry: Not on file    Inability: Not on file  . Transportation needs:    Medical: Not on file    Non-medical: Not on file  Tobacco Use  . Smoking  status: Former Smoker    Packs/day: 1.00    Years: 30.00    Pack years: 30.00    Types: Cigarettes  . Smokeless tobacco: Never Used  . Tobacco comment: quit 2008  Substance and Sexual Activity  . Alcohol use: No    Alcohol/week: 0.0 oz    Comment: Occasional use  . Drug use: No  . Sexual activity: Yes    Partners: Male    Birth control/protection: Surgical  Lifestyle  . Physical activity:    Days per week: Not on file    Minutes per session: Not on file  . Stress: Not on file  Relationships  . Social connections:    Talks on phone: Not on file    Gets together: Not on file    Attends religious service: Not on file    Active member of club or organization: Not on file    Attends meetings of clubs or organizations: Not on file    Relationship status: Not on file  Other Topics Concern  . Not on file  Social History Narrative  . Not on file    Allergies: No Known Allergies  Metabolic Disorder Labs: Lab Results  Component Value Date   HGBA1C 5.4 06/10/2015   MPG 108 06/10/2015   No results found for: PROLACTIN No results found for: CHOL, TRIG, HDL, CHOLHDL, VLDL, LDLCALC No results found for: TSH  Therapeutic Level Labs: Lab Results  Component Value Date   LITHIUM 0.80 06/10/2015   No results found for: VALPROATE No components found for:  CBMZ  Current Medications: Current Outpatient Medications  Medication Sig Dispense Refill  . Calcium Carbonate-Vitamin D (CALTRATE 600+D PO) Take 2 tablets by mouth daily.    . hydrocortisone (ANUSOL-HC) 2.5 % rectal cream Place 1 application rectally 2 (two) times daily. Apply around anus for irritated & painful hemorrhoids 15 g 2  . levothyroxine (SYNTHROID, LEVOTHROID) 137 MCG tablet Take 137 mcg by mouth daily before breakfast.    . lithium 600 MG capsule Take 1 capsule (600 mg total) by mouth 2 (two) times daily with a meal. 180 capsule 0  . Multiple Vitamin (MULTIVITAMIN WITH MINERALS) TABS tablet Take 1 tablet by mouth  daily.    . Omega-3 Fatty Acids (FISH OIL) 1000 MG CAPS Take 2,000 mg by mouth daily.    [name] oxyCODONE (OXY IR/ROXICODONE) 5 MG immediate release tablet Take 1-2 tablets (5-10 mg total) by mouth every 4 (four) hours as needed for moderate pain, severe pain or breakthrough pain. 40 tablet 0  . QUERCETIN PO Take 1 capsule by mouth 2 (two) times daily.    . QUEtiapine (SEROQUEL) 100 MG tablet Take 1.5 tablets (150 mg total) by mouth at bedtime. 135 tablet 0  . venlafaxine XR (EFFEXOR-XR) 150 MG 24 hr capsule Take total of 225 mg (150+75) mg 90 capsule 0  .  venlafaxine XR (EFFEXOR-XR) 75 MG 24 hr capsule Take total of 225 mg (150+75) mg 90 capsule 0   No current facility-administered medications for this visit.      Musculoskeletal: Strength & Muscle Tone: within normal limits Gait & Station: normal Patient leans: N/A  Psychiatric Specialty Exam: Review of Systems  Psychiatric/Behavioral: Positive for depression. Negative for hallucinations, memory loss, substance abuse and suicidal ideas. The patient has insomnia. The patient is not nervous/anxious.   All other systems reviewed and are negative.   Blood pressure 119/81, pulse 70, height 5\' 9"  (1.753 m), weight 212 lb (96.2 kg), SpO2 99 %.Body mass index is 31.31 kg/m.  General Appearance: Fairly Groomed  Eye Contact:  Good  Speech:  Clear and Coherent  Volume:  Normal  Mood:  "better"  Affect:  Appropriate, Congruent and smiles, euthymic  Thought Process:  Coherent  Orientation:  Full (Time, Place, and Person)  Thought Content: Logical   Suicidal Thoughts:  No  Homicidal Thoughts:  No  Memory:  Immediate;   Good  Judgement:  Good  Insight:  Good  Psychomotor Activity:  Normal  Concentration:  Concentration: Good and Attention Span: Good  Recall:  Good  Fund of Knowledge: Good  Language: Good  Akathisia:  No  Handed:  Right  AIMS (if indicated): not done  Assets:  Communication Skills Desire for Improvement  ADL's:  Intact   Cognition: WNL  Sleep:  Poor   Screenings:  MOCA 24/30 (-1 for visuospatial, -2 for attention, -3 for delayed recall) 09/2017   Assessment and Plan:  Miranda Harris is a 57 y.o. year old female with a history of bipolar I disorder, anxiety, alcohol use disorder in sustained remission, hypothyroidism , who presents for follow up appointment for Bipolar I disorder, most recent episode depressed ([place])  # Bipolar I disorder Although patient reports episode of slight worsening in neurovegetative symptoms, it has been improving in the context of starting a job. Will continue Effexor to target depression. Will continue quetiapine for bipolar disorder. Discussed risks, including, but not limited to metabolic side effect.  Willl continue lithium for mood stabilization/. She is advised to obtain labs with her PCP. Discussed behavioral activation.   # Mild neurocognitive disorder Patient denies significant concern since the last appointment. IADL partial independent. Will continue to monitor.  #Mild neurocognitive disorder Patient endorses difficulty with concentration and memory loss. IADL partially independent. She would like to hold off neuropsych evaluation at this time. Will discuss as needed.   Plan:  I have reviewed and updated plans as below 1. Continue venlafaxine 225 mg (150+75 mg) 2. Continue quetiapine 150 mg at night 3. Continue lithium 600 mg twice a day 4. Return to clinic in four months for 30 mins (TSH wnl, creat 1.13, Lithium 1.2, checked 12/2016) 5. Check blood test (lithium,BMP) - she will contact the clinic if she does not see her PCP. TSH is checked this morning per patient.   The patient demonstrates the following risk factors for suicide: Chronic risk factorsfor suicide include psychiatric disorder /bipolar disorder,history of substance use disorder, family history of suicide attempt. Acute risk factorsfor suicide includenone.Protective factorsfor this patient include  positive social support, responsibility to others (family), coping skills, hope for the future. Although patient reports passive SI, she adamantly denies any intent and has had no previous suicide attempt.Considering these factors, the overall suicide risk at this point appears to be low. Discussed emergency resources including calling 911, suicide crisis line and coming to ED.  Patient is advised to call the clinic for earlier appointment if any worsening in her symptoms.  The duration of this appointment visit was 30 minutes of face-to-face time with the patient.  Greater than 50% of this time was spent in counseling, explanation of  diagnosis, planning of further management, and coordination of care.  [name], MD 03/21/2018, 4:49 PM

## 2018-03-21 ENCOUNTER — Ambulatory Visit (INDEPENDENT_AMBULATORY_CARE_PROVIDER_SITE_OTHER): Payer: 59 | Admitting: Psychiatry

## 2018-03-21 VITALS — BP 119/81 | HR 70 | Ht 69.0 in | Wt 212.0 lb

## 2018-03-21 DIAGNOSIS — F3130 Bipolar disorder, current episode depressed, mild or moderate severity, unspecified: Secondary | ICD-10-CM

## 2018-03-21 MED ORDER — VENLAFAXINE HCL ER 75 MG PO CP24: ORAL_CAPSULE | ORAL | 0 refills | Status: DC

## 2018-03-21 MED ORDER — LITHIUM CARBONATE 600 MG PO CAPS: 600.0000 mg | ORAL_CAPSULE | Freq: Two times a day (BID) | ORAL | 0 refills | Status: DC

## 2018-03-21 MED ORDER — VENLAFAXINE HCL ER 150 MG PO CP24: ORAL_CAPSULE | ORAL | 0 refills | Status: DC

## 2018-03-21 MED ORDER — QUETIAPINE FUMARATE 100 MG PO TABS: 150.0000 mg | ORAL_TABLET | Freq: Every day | ORAL | 0 refills | Status: DC

## 2018-03-21 NOTE — Patient Instructions (Addendum)
1. Continue venlafaxine 225 mg (150+75 mg) 2. Continue quetiapine 150 mg at night 3. Continue lithium 600 mg twice a day 4. Return to clinic in four months for 30 mins ( 5. Check blood test (lithiium,BMP)

## 2018-03-27 ENCOUNTER — Other Ambulatory Visit: Payer: Self-pay | Admitting: Nurse Practitioner

## 2018-03-27 DIAGNOSIS — Z1231 Encounter for screening mammogram for malignant neoplasm of breast: Secondary | ICD-10-CM

## 2018-04-06 DIAGNOSIS — Z79899 Other long term (current) drug therapy: Secondary | ICD-10-CM | POA: Diagnosis not present

## 2018-04-10 ENCOUNTER — Telehealth (HOSPITAL_COMMUNITY): Payer: Self-pay | Admitting: Psychiatry

## 2018-04-10 NOTE — Telephone Encounter (Signed)
Received fax. Cre 1.3, Li 1.1. On 04/06/2018. Will continue current dose of lithium.

## 2018-04-25 ENCOUNTER — Ambulatory Visit
Admission: RE | Admit: 2018-04-25 | Discharge: 2018-04-25 | Disposition: A | Discharge status: Home / Self Care | Payer: Medicare Other | Source: Ambulatory Visit | Attending: Nurse Practitioner | Admitting: Nurse Practitioner

## 2018-04-25 DIAGNOSIS — Z1231 Encounter for screening mammogram for malignant neoplasm of breast: Secondary | ICD-10-CM

## 2018-05-23 ENCOUNTER — Encounter

## 2018-05-23 ENCOUNTER — Institutional Professional Consult (permissible substitution): Payer: Self-pay | Admitting: Neurology

## 2018-05-30 DIAGNOSIS — G4719 Other hypersomnia: Secondary | ICD-10-CM | POA: Diagnosis not present

## 2018-05-30 DIAGNOSIS — R400 Somnolence: Secondary | ICD-10-CM | POA: Diagnosis not present

## 2018-06-20 DIAGNOSIS — R400 Somnolence: Secondary | ICD-10-CM | POA: Insufficient documentation

## 2018-07-19 NOTE — Progress Notes (Signed)
[facility] MD/PA/NP OP Progress Note  07/23/2018 10:20 AM Miranda Harris  MRN:  [id number]  Chief Complaint:  Chief Complaint    Follow-up; Depression; Other     HPI:  Patient presents for follow-up appointment for bipolar 1 disorder.  She states that she has been feeling more depressed over the past few months.  She feels inadequate and tends to ruminate on the past.  She also regrets the things she has not done due to her mental illness. However, she also reports that she does not think she was able to meet with her husband and son if she were to go to other route.  She visited her son for wedding in [place].  She liked the [place] much better than [place]. She feels bored at work. 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 She has passive SI. She adamantly denies any intent, stating that her father committed suicide and she is so how devastating it was for her family.  She cannot do that to her husband and son.  She has difficulty in concentration.  She has anhedonia.  She has fair sleep.  She denies decreased need for sleep or euphoria.  She has mild anxiety.  She denies panic attacks. She has good appetite. She attributes her weight loss to be more active at work.   Wt Readings from Last 3 Encounters:  07/23/18 203 lb (92.1 kg)  03/21/18 212 lb (96.2 kg)  10/19/17 219 lb (99.3 kg)    Visit Diagnosis:    ICD-10-CM   1. Bipolar I disorder, most recent episode depressed ([place]) F31.30     Past Psychiatric History: Please see initial evaluation for full details. I have reviewed the history. No updates at this time.     Past Medical History:  Past Medical History:  Diagnosis Date  . Bipolar disorder ([place])   . Elevated serum creatinine    related to lithium use  . Helicobacter pylori (H. pylori) infection    pt denies  . Hypothyroidism   . Perirectal fistula   . Thyroid disease   . Trochanteric bursitis of right hip     Past Surgical History:  Procedure Laterality Date  . EVALUATION  UNDER ANESTHESIA WITH HEMORRHOIDECTOMY N/A 06/28/2017   Procedure: EXAM UNDER ANESTHESIA WITH EXTERNAL HEMORRHOIDECTOMY AND REPAIR OF PERIRECTAL FISTULA;  Surgeon: Michael Boston, MD;  Location: [facility];  Service: General;  Laterality: N/A;  . HERNIA REPAIR  1-06 yrs ago   umbilical hernia  . INCISION AND DRAINAGE PERIRECTAL ABSCESS N/A 04/18/2017   Procedure: IRRIGATION AND DEBRIDEMENT PERIRECTAL ABSCESS, placement of seton, sigmoidoscopy;  Surgeon: Excell Seltzer, MD;  Location: [facility];  Service: General;  Laterality: N/A;  . LAPAROSCOPIC APPENDECTOMY    . VAGINAL HYSTERECTOMY Bilateral    complete  . WISDOM TOOTH EXTRACTION      Family Psychiatric History: Please see initial evaluation for full details. I have reviewed the history. No updates at this time.     Family History:  Family History  Problem Relation Age of Onset  . Anxiety disorder Brother     Social History:  Social History   Socioeconomic History  . Marital status: Married    Spouse name: Not on file  . Number of children: Not on file  . Years of education: Not on file  . Highest education level: Not on file  Occupational History  . Not on file  Social Needs  . Financial resource strain: Not on file  . Food insecurity:  Worry: Not on file    Inability: Not on file  . Transportation needs:    Medical: Not on file    Non-medical: Not on file  Tobacco Use  . Smoking status: Former Smoker    Packs/day: 1.00    Years: 30.00    Pack years: 30.00    Types: Cigarettes  . Smokeless tobacco: Never Used  . Tobacco comment: quit 2008  Substance and Sexual Activity  . Alcohol use: No    Alcohol/week: 0.0 standard drinks    Comment: Occasional use  . Drug use: No  . Sexual activity: Yes    Partners: Male    Birth control/protection: Surgical  Lifestyle  . Physical activity:    Days per week: Not on file    Minutes per session: Not on file  . Stress: Not on file  Relationships  .  Social connections:    Talks on phone: Not on file    Gets together: Not on file    Attends religious service: Not on file    Active member of club or organization: Not on file    Attends meetings of clubs or organizations: Not on file    Relationship status: Not on file  Other Topics Concern  . Not on file  Social History Narrative  . Not on file    Allergies: No Known Allergies  Metabolic Disorder Labs: Lab Results  Component Value Date   HGBA1C 5.4 06/10/2015   MPG 108 06/10/2015   No results found for: PROLACTIN No results found for: CHOL, TRIG, HDL, CHOLHDL, VLDL, LDLCALC No results found for: TSH  Therapeutic Level Labs: Lab Results  Component Value Date   LITHIUM 0.80 06/10/2015   No results found for: VALPROATE No components found for:  CBMZ  Current Medications: Current Outpatient Medications  Medication Sig Dispense Refill  . Calcium Carbonate-Vitamin D (CALTRATE 600+D PO) Take 2 tablets by mouth daily.    . hydrocortisone (ANUSOL-HC) 2.5 % rectal cream Place 1 application rectally 2 (two) times daily. Apply around anus for irritated & painful hemorrhoids 15 g 2  . levothyroxine (SYNTHROID, LEVOTHROID) 137 MCG tablet Take 137 mcg by mouth daily before breakfast.    . lithium 600 MG capsule Take 1 capsule (600 mg total) by mouth 2 (two) times daily with a meal. 180 capsule 0  . Multiple Vitamin (MULTIVITAMIN WITH MINERALS) TABS tablet Take 1 tablet by mouth daily.    . Omega-3 Fatty Acids (FISH OIL) 1000 MG CAPS Take 2,000 mg by mouth daily.    [name] oxyCODONE (OXY IR/ROXICODONE) 5 MG immediate release tablet Take 1-2 tablets (5-10 mg total) by mouth every 4 (four) hours as needed for moderate pain, severe pain or breakthrough pain. 40 tablet 0  . QUERCETIN PO Take 1 capsule by mouth 2 (two) times daily.    [name] venlafaxine XR (EFFEXOR-XR) 150 MG 24 hr capsule Take total of 225 mg (150+75) mg 90 capsule 0  . venlafaxine XR (EFFEXOR-XR) 75 MG 24 hr capsule Take total of  225 mg (150+75) mg 90 capsule 0  . lurasidone (LATUDA) 20 MG TABS tablet Take 1 tablet (20 mg total) by mouth daily. 30 tablet 0  . QUEtiapine (SEROQUEL) 100 MG tablet Take 1.5 tablets (150 mg total) by mouth at bedtime. 135 tablet 0   No current facility-administered medications for this visit.      Musculoskeletal: Strength & Muscle Tone: within normal limits Gait & Station: normal Patient leans: N/A  Psychiatric Specialty Exam:  Review of Systems  Psychiatric/Behavioral: Positive for depression and suicidal ideas. Negative for hallucinations, memory loss and substance abuse. The patient is nervous/anxious. The patient does not have insomnia.   All other systems reviewed and are negative.   Blood pressure 133/84, pulse 74, height 5\' 9"  (1.753 m), weight 203 lb (92.1 kg), SpO2 100 %.Body mass index is 29.98 kg/m.  General Appearance: Fairly Groomed  Eye Contact:  Good  Speech:  Clear and Coherent  Volume:  Normal  Mood:  Depressed  Affect:  Appropriate, Congruent, Tearful and down  Thought Process:  Coherent and Goal Directed  Orientation:  Full (Time, Place, and Person)  Thought Content: Logical   Suicidal Thoughts:  Yes.  without intent/plan  Homicidal Thoughts:  No  Memory:  Immediate;   Good  Judgement:  Good  Insight:  Good  Psychomotor Activity:  Normal  Concentration:  Concentration: Good and Attention Span: Good  Recall:  Good  Fund of Knowledge: Good  Language: Good  Akathisia:  No  Handed:  Right  AIMS (if indicated): not done  Assets:  Communication Skills Desire for Improvement  ADL's:  Intact  Cognition: WNL  Sleep:  Fair   Screenings: MOCA 24/30 (-1 for visuospatial, -2 for attention, -3 for delayed recall) 09/2017  Assessment and Plan:  Shakela Donati is a 57 y.o. year old female with a history of bipolar I disorder, anxiety, alcohol use disorder in sustained remission, hypothyroidism, who presents for follow up appointment for Bipolar I disorder, most  recent episode depressed ([place])  # Bipolar I disorder Patient reports worsening in depressive symptoms without significant trigger.  Will switch from quetiapine to [facility] for treatment of bipolar depression.  Will discontinue quetiapine.  Will continue lithium for mood stabilization.  Will continue venlafaxine.   # Mild neurocognitive disorder Patient reports history of difficulty with concentration and memory loss.  Will continue to monitor.   Plan:  I have reviewed and updated plans as below 1. Continue venlafaxine 225 mg (150+75 mg) 2. Discontinue quetiapine 3. Start Latuda 20 mg daily  3. Continue lithium 600 mg twice a day 4. Return to clinic in one month for 30 mins  5. She will bring the blood test result of lithium, BMP (reportedly normal, a few months ago)  Past trials of medication: Sertraline, Lexapro, Duloxetine, Wellbutrin (insomnia),  Abilify (flu like symptoms), Haldol (EPS), Thorazine (EPS), Stelazine, Lithium, Lunesta (sedation), Ambien (sleepwalking, driving, eating), Trazodone (excessive sedation), Xanax  The patient demonstrates the following risk factors for suicide: Chronic risk factorsfor suicide include psychiatric disorder /bipolar disorder,history of substance use disorder, family history of suicide attempt. Acute risk factorsfor suicide includenone.Protective factorsfor this patient include positive social support, responsibility to others (family), coping skills, hope for the future. Although patient reports passive SI, she adamantly denies any intent and has had no previous suicide attempt.Considering these factors, the overall suicide risk at this point appears to be low. Discussed emergency resources including calling 911, suicide crisis line and coming to ED. Patient is advised to call the clinic for earlier appointment if any worsening in her symptoms.  The duration of this appointment visit was 30 minutes of face-to-face time with the patient.   Greater than 50% of this time was spent in counseling, explanation of  diagnosis, planning of further management, and coordination of care.  [name], MD 07/23/2018, 10:20 AM

## 2018-07-23 ENCOUNTER — Ambulatory Visit (INDEPENDENT_AMBULATORY_CARE_PROVIDER_SITE_OTHER): Payer: 59 | Admitting: Psychiatry

## 2018-07-23 ENCOUNTER — Encounter (HOSPITAL_COMMUNITY): Payer: Self-pay | Admitting: Psychiatry

## 2018-07-23 VITALS — BP 133/84 | HR 74 | Ht 69.0 in | Wt 203.0 lb

## 2018-07-23 DIAGNOSIS — F419 Anxiety disorder, unspecified: Secondary | ICD-10-CM

## 2018-07-23 DIAGNOSIS — F3130 Bipolar disorder, current episode depressed, mild or moderate severity, unspecified: Secondary | ICD-10-CM

## 2018-07-23 MED ORDER — LURASIDONE HCL 20 MG PO TABS: 20.0000 mg | ORAL_TABLET | Freq: Every day | ORAL | 0 refills | Status: DC

## 2018-07-23 MED ORDER — LITHIUM CARBONATE 600 MG PO CAPS: 600.0000 mg | ORAL_CAPSULE | Freq: Two times a day (BID) | ORAL | 0 refills | Status: DC

## 2018-07-23 MED ORDER — VENLAFAXINE HCL ER 150 MG PO CP24: ORAL_CAPSULE | ORAL | 0 refills | Status: DC

## 2018-07-23 MED ORDER — VENLAFAXINE HCL ER 75 MG PO CP24: ORAL_CAPSULE | ORAL | 0 refills | Status: DC

## 2018-07-23 NOTE — Patient Instructions (Signed)
1. Continue venlafaxine 225 mg (150+75 mg) 2. Discontinue quetiapine 3. Start latuda 20 mg daily  3. Continue lithium 600 mg twice a day 4. Return to clinic in one month for 30 mins

## 2018-08-14 DIAGNOSIS — E785 Hyperlipidemia, unspecified: Secondary | ICD-10-CM | POA: Diagnosis not present

## 2018-08-14 DIAGNOSIS — Z5181 Encounter for therapeutic drug level monitoring: Secondary | ICD-10-CM | POA: Diagnosis not present

## 2018-08-14 DIAGNOSIS — Z79899 Other long term (current) drug therapy: Secondary | ICD-10-CM | POA: Diagnosis not present

## 2018-08-16 ENCOUNTER — Telehealth (HOSPITAL_COMMUNITY): Payer: Self-pay | Admitting: Psychiatry

## 2018-08-16 NOTE — Telephone Encounter (Signed)
Lithium 1.3 on 11/19, Cre 1.3 on 04/2018  Left voice message to contact the office regarding her blood test.  Please inform the following when she calls back: her lithium level is marginally high, although it is fine to stay on the current dose unless she has any side effect such as  tremors, confusion or gait imbalances.  Will plan to recheck lithium level in a month. Will discuss more at the next visit.

## 2018-08-22 DIAGNOSIS — R1110 Vomiting, unspecified: Secondary | ICD-10-CM | POA: Diagnosis not present

## 2018-08-22 DIAGNOSIS — R509 Fever, unspecified: Secondary | ICD-10-CM | POA: Diagnosis not present

## 2018-08-22 NOTE — Progress Notes (Deleted)
[facility] MD/PA/NP OP Progress Note  08/22/2018 2:01 PM Miranda Harris  MRN:  [id number]  Chief Complaint:  HPI:   Lithium dose  Visit Diagnosis: No diagnosis found.  Past Psychiatric History: Please see initial evaluation for full details. I have reviewed the history. No updates at this time.     Past Medical History:  Past Medical History:  Diagnosis Date  . Bipolar disorder ([place])   . Elevated serum creatinine    related to lithium use  . Helicobacter pylori (H. pylori) infection    pt denies  . Hypothyroidism   . Perirectal fistula   . Thyroid disease   . Trochanteric bursitis of right hip     Past Surgical History:  Procedure Laterality Date  . EVALUATION UNDER ANESTHESIA WITH HEMORRHOIDECTOMY N/A 06/28/2017   Procedure: EXAM UNDER ANESTHESIA WITH EXTERNAL HEMORRHOIDECTOMY AND REPAIR OF PERIRECTAL FISTULA;  Surgeon: Michael Boston, MD;  Location: [facility];  Service: General;  Laterality: N/A;  . HERNIA REPAIR  3-24 yrs ago   umbilical hernia  . INCISION AND DRAINAGE PERIRECTAL ABSCESS N/A 04/18/2017   Procedure: IRRIGATION AND DEBRIDEMENT PERIRECTAL ABSCESS, placement of seton, sigmoidoscopy;  Surgeon: Excell Seltzer, MD;  Location: [facility];  Service: General;  Laterality: N/A;  . LAPAROSCOPIC APPENDECTOMY    . VAGINAL HYSTERECTOMY Bilateral    complete  . WISDOM TOOTH EXTRACTION      Family Psychiatric History: Please see initial evaluation for full details. I have reviewed the history. No updates at this time.     Family History:  Family History  Problem Relation Age of Onset  . Anxiety disorder Brother     Social History:  Social History   Socioeconomic History  . Marital status: Married    Spouse name: Not on file  . Number of children: Not on file  . Years of education: Not on file  . Highest education level: Not on file  Occupational History  . Not on file  Social Needs  . Financial resource strain: Not on file  . Food insecurity:     Worry: Not on file    Inability: Not on file  . Transportation needs:    Medical: Not on file    Non-medical: Not on file  Tobacco Use  . Smoking status: Former Smoker    Packs/day: 1.00    Years: 30.00    Pack years: 30.00    Types: Cigarettes  . Smokeless tobacco: Never Used  . Tobacco comment: quit 2008  Substance and Sexual Activity  . Alcohol use: No    Alcohol/week: 0.0 standard drinks    Comment: Occasional use  . Drug use: No  . Sexual activity: Yes    Partners: Male    Birth control/protection: Surgical  Lifestyle  . Physical activity:    Days per week: Not on file    Minutes per session: Not on file  . Stress: Not on file  Relationships  . Social connections:    Talks on phone: Not on file    Gets together: Not on file    Attends religious service: Not on file    Active member of club or organization: Not on file    Attends meetings of clubs or organizations: Not on file    Relationship status: Not on file  Other Topics Concern  . Not on file  Social History Narrative  . Not on file    Allergies: No Known Allergies  Metabolic Disorder Labs: Lab Results  Component Value  Date   HGBA1C 5.4 06/10/2015   MPG 108 06/10/2015   No results found for: PROLACTIN No results found for: CHOL, TRIG, HDL, CHOLHDL, VLDL, LDLCALC No results found for: TSH  Therapeutic Level Labs: Lab Results  Component Value Date   LITHIUM 0.80 06/10/2015   No results found for: VALPROATE No components found for:  CBMZ  Current Medications: Current Outpatient Medications  Medication Sig Dispense Refill  . Calcium Carbonate-Vitamin D (CALTRATE 600+D PO) Take 2 tablets by mouth daily.    . hydrocortisone (ANUSOL-HC) 2.5 % rectal cream Place 1 application rectally 2 (two) times daily. Apply around anus for irritated & painful hemorrhoids 15 g 2  . levothyroxine (SYNTHROID, LEVOTHROID) 137 MCG tablet Take 137 mcg by mouth daily before breakfast.    . lithium 600 MG capsule  Take 1 capsule (600 mg total) by mouth 2 (two) times daily with a meal. 180 capsule 0  . lurasidone (LATUDA) 20 MG TABS tablet Take 1 tablet (20 mg total) by mouth daily. 30 tablet 0  . Multiple Vitamin (MULTIVITAMIN WITH MINERALS) TABS tablet Take 1 tablet by mouth daily.    . Omega-3 Fatty Acids (FISH OIL) 1000 MG CAPS Take 2,000 mg by mouth daily.    [name] oxyCODONE (OXY IR/ROXICODONE) 5 MG immediate release tablet Take 1-2 tablets (5-10 mg total) by mouth every 4 (four) hours as needed for moderate pain, severe pain or breakthrough pain. 40 tablet 0  . QUERCETIN PO Take 1 capsule by mouth 2 (two) times daily.    . QUEtiapine (SEROQUEL) 100 MG tablet Take 1.5 tablets (150 mg total) by mouth at bedtime. 135 tablet 0  . venlafaxine XR (EFFEXOR-XR) 150 MG 24 hr capsule Take total of 225 mg (150+75) mg 90 capsule 0  . venlafaxine XR (EFFEXOR-XR) 75 MG 24 hr capsule Take total of 225 mg (150+75) mg 90 capsule 0   No current facility-administered medications for this visit.      Musculoskeletal: Strength & Muscle Tone: within normal limits Gait & Station: normal Patient leans: N/A  Psychiatric Specialty Exam: ROS  There were no vitals taken for this visit.There is no height or weight on file to calculate BMI.  General Appearance: Fairly Groomed  Eye Contact:  Good  Speech:  Clear and Coherent  Volume:  Normal  Mood:  {BHH MOOD:22306}  Affect:  {Affect (PAA):22687}  Thought Process:  Coherent  Orientation:  Full (Time, Place, and Person)  Thought Content: Logical   Suicidal Thoughts:  {ST/HT (PAA):22692}  Homicidal Thoughts:  {ST/HT (PAA):22692}  Memory:  Immediate;   Good  Judgement:  {Judgement (PAA):22694}  Insight:  {Insight (PAA):22695}  Psychomotor Activity:  Normal  Concentration:  Concentration: Good and Attention Span: Good  Recall:  Good  Fund of Knowledge: Good  Language: Good  Akathisia:  No  Handed:  Right  AIMS (if indicated): not done  Assets:  Communication  Skills Desire for Improvement  ADL's:  Intact  Cognition: WNL  Sleep:  {BHH GOOD/FAIR/POOR:22877}   Screenings:  MOCA 24/30 (-1 for visuospatial, -2 for attention, -3 for delayed recall)09/2017  Assessment and Plan:  Miranda Harris is a 57 y.o. year old female with a history of bipolar I disorder, anxiety,  alcohol use disorder in sustained remission, hypothyroidism, who presents for follow up appointment for No diagnosis found.  # Bipolar I disorder  Patient reports worsening in depressive symptoms without significant trigger.  Will switch from quetiapine to [facility] for treatment of bipolar depression.  Will discontinue  quetiapine.  Will continue lithium for mood stabilization.  Will continue venlafaxine.   # Mild neurocognitive disorder Patient reports history of difficulty with concentration and memory loss.  Will continue to monitor.   Plan:   1. Continue venlafaxine 225 mg (150+75 mg) 2. Discontinue quetiapine 3. Start Latuda 20 mg daily  3. Continue lithium 600 mg twice a day 4. Return to clinic in one month for 30 mins  5. She will bring the blood test result of lithium, BMP (reportedly normal, a few months ago)  Past trials of medication: Sertraline, Lexapro, Duloxetine, Wellbutrin (insomnia),  Abilify (flu like symptoms), Haldol (EPS), Thorazine (EPS), Stelazine, Lithium, Lunesta (sedation), Ambien (sleepwalking, driving, eating), Trazodone (excessive sedation), Xanax  The patient demonstrates the following risk factors for suicide: Chronic risk factorsfor suicide include psychiatric disorder /bipolar disorder,history of substance use disorder, family history of suicide attempt. Acute risk factorsfor suicide includenone.Protective factorsfor this patient include positive social support, responsibility to others (family), coping skills, hope for the future. Although patient reports passive SI, she adamantly denies any intent and has had no previous suicide  attempt.Considering these factors, the overall suicide risk at this point appears to be low. Discussed emergency resources including calling 911, suicide crisis line and coming to ED. Patient is advised to call the clinic for earlier appointment if any worsening in her symptoms.   [name], MD 08/22/2018, 2:01 PM

## 2018-08-28 ENCOUNTER — Ambulatory Visit (HOSPITAL_COMMUNITY): Payer: 59 | Admitting: Psychiatry

## 2018-08-29 ENCOUNTER — Ambulatory Visit (INDEPENDENT_AMBULATORY_CARE_PROVIDER_SITE_OTHER): Payer: 59 | Admitting: Psychiatry

## 2018-08-29 ENCOUNTER — Encounter (HOSPITAL_COMMUNITY): Payer: Self-pay | Admitting: Psychiatry

## 2018-08-29 VITALS — BP 120/88 | HR 69 | Ht 69.0 in | Wt 202.0 lb

## 2018-08-29 DIAGNOSIS — F3130 Bipolar disorder, current episode depressed, mild or moderate severity, unspecified: Secondary | ICD-10-CM

## 2018-08-29 MED ORDER — QUETIAPINE FUMARATE 100 MG PO TABS: 150.0000 mg | ORAL_TABLET | Freq: Every day | ORAL | 0 refills | Status: DC

## 2018-08-29 MED ORDER — ESCITALOPRAM OXALATE 20 MG PO TABS: ORAL_TABLET | ORAL | 0 refills | Status: DC

## 2018-08-29 NOTE — Progress Notes (Signed)
[facility] MD/PA/NP OP Progress Note  08/29/2018 4:47 PM Miranda Harris  MRN:  [id number]  Chief Complaint:  Chief Complaint    Follow-up; Other     HPI:  Patient presents for follow-up appointment for bipolar 1 disorder.  She states that she continues to feel down.  She had a GI infection on Thanksgiving, and celebrated it on weekend with her husband.  She states that she has good relationship with her husband, although they sometimes do have disagreement as usual couple.  She also talks about her son, who got married in September.  Her son has been always doing good, and she likes her daughter-in-law, who is a Marine scientist.  She wishes that she has more friends in the area, referring that she had many friends in [place]. she plans to keep her part-time jobs as she likes to socialize with other people.  She currently works 3 to 4 days a week.  She enjoys taking a walk with her dogs.  She sleeps 10 hours and feels refreshed.  She feels fatigue.  She has fair concentration and appetite.  She has fleeting passive SI, although she adamantly denies any intent or plan.  She feels anxious at times. She does not think Effexor is helpful. She could not afford latuda; she also did not notice any difference when she tried samples for 3 weeks.     Wt Readings from Last 3 Encounters:  08/29/18 202 lb (91.6 kg)  07/23/18 203 lb (92.1 kg)  03/21/18 212 lb (96.2 kg)    Visit Diagnosis:    ICD-10-CM   1. Bipolar I disorder, most recent episode depressed ([place]) F31.30     Past Psychiatric History: Please see initial evaluation for full details. I have reviewed the history. No updates at this time.  Patient   Past Medical History:  Past Medical History:  Diagnosis Date  . Bipolar disorder ([place])   . Elevated serum creatinine    related to lithium use  . Helicobacter pylori (H. pylori) infection    pt denies  . Hypothyroidism   . Perirectal fistula   . Thyroid disease   . Trochanteric bursitis of right hip     Past  Surgical History:  Procedure Laterality Date  . EVALUATION UNDER ANESTHESIA WITH HEMORRHOIDECTOMY N/A 06/28/2017   Procedure: EXAM UNDER ANESTHESIA WITH EXTERNAL HEMORRHOIDECTOMY AND REPAIR OF PERIRECTAL FISTULA;  Surgeon: Michael Boston, MD;  Location: [facility];  Service: General;  Laterality: N/A;  . HERNIA REPAIR  8-78 yrs ago   umbilical hernia  . INCISION AND DRAINAGE PERIRECTAL ABSCESS N/A 04/18/2017   Procedure: IRRIGATION AND DEBRIDEMENT PERIRECTAL ABSCESS, placement of seton, sigmoidoscopy;  Surgeon: Excell Seltzer, MD;  Location: [facility];  Service: General;  Laterality: N/A;  . LAPAROSCOPIC APPENDECTOMY    . VAGINAL HYSTERECTOMY Bilateral    complete  . WISDOM TOOTH EXTRACTION      Family Psychiatric History: Please see initial evaluation for full details. I have reviewed the history. No updates at this time.     Family History:  Family History  Problem Relation Age of Onset  . Anxiety disorder Brother     Social History:  Social History   Socioeconomic History  . Marital status: Married    Spouse name: Not on file  . Number of children: Not on file  . Years of education: Not on file  . Highest education level: Not on file  Occupational History  . Not on file  Social Needs  . Financial  resource strain: Not on file  . Food insecurity:    Worry: Not on file    Inability: Not on file  . Transportation needs:    Medical: Not on file    Non-medical: Not on file  Tobacco Use  . Smoking status: Former Smoker    Packs/day: 1.00    Years: 30.00    Pack years: 30.00    Types: Cigarettes  . Smokeless tobacco: Never Used  . Tobacco comment: quit 2008  Substance and Sexual Activity  . Alcohol use: No    Alcohol/week: 0.0 standard drinks    Comment: Occasional use  . Drug use: No  . Sexual activity: Yes    Partners: Male    Birth control/protection: Surgical  Lifestyle  . Physical activity:    Days per week: Not on file    Minutes per  session: Not on file  . Stress: Not on file  Relationships  . Social connections:    Talks on phone: Not on file    Gets together: Not on file    Attends religious service: Not on file    Active member of club or organization: Not on file    Attends meetings of clubs or organizations: Not on file    Relationship status: Not on file  Other Topics Concern  . Not on file  Social History Narrative  . Not on file    Allergies: No Known Allergies  Metabolic Disorder Labs: Lab Results  Component Value Date   HGBA1C 5.4 06/10/2015   MPG 108 06/10/2015   No results found for: PROLACTIN No results found for: CHOL, TRIG, HDL, CHOLHDL, VLDL, LDLCALC No results found for: TSH  Therapeutic Level Labs: Lab Results  Component Value Date   LITHIUM 0.80 06/10/2015   No results found for: VALPROATE No components found for:  CBMZ  Current Medications: Current Outpatient Medications  Medication Sig Dispense Refill  . Calcium Carbonate-Vitamin D (CALTRATE 600+D PO) Take 2 tablets by mouth daily.    . hydrocortisone (ANUSOL-HC) 2.5 % rectal cream Place 1 application rectally 2 (two) times daily. Apply around anus for irritated & painful hemorrhoids 15 g 2  . levothyroxine (SYNTHROID, LEVOTHROID) 137 MCG tablet Take 137 mcg by mouth daily before breakfast.    . lithium 600 MG capsule Take 1 capsule (600 mg total) by mouth 2 (two) times daily with a meal. 180 capsule 0  . Multiple Vitamin (MULTIVITAMIN WITH MINERALS) TABS tablet Take 1 tablet by mouth daily.    . Omega-3 Fatty Acids (FISH OIL) 1000 MG CAPS Take 2,000 mg by mouth daily.    [name] oxyCODONE (OXY IR/ROXICODONE) 5 MG immediate release tablet Take 1-2 tablets (5-10 mg total) by mouth every 4 (four) hours as needed for moderate pain, severe pain or breakthrough pain. 40 tablet 0  . QUERCETIN PO Take 1 capsule by mouth 2 (two) times daily.    [name] escitalopram (LEXAPRO) 20 MG tablet Take 10 mg daily for one week, then 20 mg daily 90 tablet 0   . QUEtiapine (SEROQUEL) 100 MG tablet Take 1.5 tablets (150 mg total) by mouth at bedtime. 135 tablet 0   No current facility-administered medications for this visit.      Musculoskeletal: Strength & Muscle Tone: within normal limits Gait & Station: normal Patient leans: N/A  Psychiatric Specialty Exam: Review of Systems  Psychiatric/Behavioral: Positive for depression and suicidal ideas. Negative for hallucinations, memory loss and substance abuse. The patient is nervous/anxious. The patient does not  have insomnia.   All other systems reviewed and are negative.   Blood pressure 120/88, pulse 69, height 5\' 9"  (1.753 m), weight 202 lb (91.6 kg), SpO2 100 %.Body mass index is 29.83 kg/m.  General Appearance: Fairly Groomed  Eye Contact:  Good  Speech:  Clear and Coherent  Volume:  Normal  Mood:  Depressed  Affect:  Appropriate, Congruent and calm, slightly down  Thought Process:  Coherent  Orientation:  Full (Time, Place, and Person)  Thought Content: Logical   Suicidal Thoughts:  Yes.  without intent/plan  Homicidal Thoughts:  No  Memory:  Immediate;   Good  Judgement:  Good  Insight:  Good  Psychomotor Activity:  Normal  Concentration:  Concentration: Good and Attention Span: Good  Recall:  Good  Fund of Knowledge: Good  Language: Good  Akathisia:  No  Handed:  Right  AIMS (if indicated): not done  Assets:  Communication Skills Desire for Improvement  ADL's:  Intact  Cognition: WNL  Sleep:  Good   Screenings:  MOCA 24/30 (-1 for visuospatial, -2 for attention, -3 for delayed recall)09/2017  Assessment and Plan:  Miranda Harris is a 57 y.o. year old female with a history of bipolar I disorder, anxiety, alcohol use disorder in sustained remission, hypothyroidism , who presents for follow up appointment for Bipolar I disorder, most recent episode depressed ([place])  # Bipolar I disorder Patient continues to have worsening in depressive symptoms without significant  trigger.  She has not noticed significant difference after switching from quetiapine to [place].  Will switch from Effexor to Lexapro, which worked well in the past.  Discussed potential side effect of serotonin syndrome.  Will continue quetiapine for bipolar depression.  Will continue lithium for bipolar disorder.  She is advised to recheck lithium level and creatinine by PCP.   Plan:  I have reviewed and updated plans as below 1. Week 1  Decrease venlafaxine 150 mg daily, start lexapro 10 mg daily  2. Week 2  Decrease venlafaxine 75 mg daily, increase lexapro 20 mg daily  3. Week 3  Discontinue venlafaxine, continue lexapro 20 mg daily  4. Continue lithium 600 mg twice a day 5. Continue quetiapine 150 mg at night 5. Return to clinic in two months for 30 mins  6. Recheck lithium, BMP (Lithium 1.3 on 11/19, Cre 1.3 on 04/2018)  Past trials of medication: Sertraline, fluoxetine (drowsiness), Lexapro, Duloxetine, Wellbutrin (insomnia),  Abilify (flu like symptoms), Haldol (EPS), Thorazine (EPS), Stelazine, Lithium, Lunesta (sedation), Ambien (sleepwalking, driving, eating), Trazodone (excessive sedation), Xanax  The patient demonstrates the following risk factors for suicide: Chronic risk factorsfor suicide include psychiatric disorder /bipolar disorder,history of substance use disorder, family history of suicide attempt. Acute risk factorsfor suicide includenone.Protective factorsfor this patient include positive social support, responsibility to others (family), coping skills, hope for the future. Although patient reports passive SI, she adamantly denies any intent and has had no previous suicide attempt.Considering these factors, the overall suicide risk at this point appears to be low. Discussed emergency resources including calling 911, suicide crisis line and coming to ED. Patient is advised to call the clinic for earlier appointment if any worsening in her symptoms.  The duration of  this appointment visit was 30 minutes of face-to-face time with the patient.  Greater than 50% of this time was spent in counseling, explanation of  diagnosis, planning of further management, and coordination of care.  [name], MD 08/29/2018, 4:47 PM

## 2018-08-29 NOTE — Patient Instructions (Addendum)
1. Week 1  Decrease venlafaxine 150 mg daily, start lexapro 10 mg daily  2. Week 2  Decrease venlafaxine 75 mg daily, increase lexapro 20 mg daily  3. Week 3  Discontinue venlafaxine, continue lexapro 20 mg daily  4. Continue lithium 600 mg twice a day 5. Continue quetiapine 150 mg at night 5. Return to clinic in two months for 30 mins  6. Recheck lithium, BMP

## 2018-10-09 DIAGNOSIS — F3130 Bipolar disorder, current episode depressed, mild or moderate severity, unspecified: Secondary | ICD-10-CM | POA: Diagnosis not present

## 2018-10-09 DIAGNOSIS — E039 Hypothyroidism, unspecified: Secondary | ICD-10-CM | POA: Diagnosis not present

## 2018-10-09 DIAGNOSIS — G4719 Other hypersomnia: Secondary | ICD-10-CM | POA: Diagnosis not present

## 2018-10-15 ENCOUNTER — Telehealth (HOSPITAL_COMMUNITY): Payer: Self-pay | Admitting: Psychiatry

## 2018-10-15 NOTE — Telephone Encounter (Signed)
Received blood test result. 10/09/2018  CBC wnl BUN 34, Cre 1.59 (to be rechecked by PCP) Vt B12, TSH 3.27 Li 1.2

## 2018-10-18 ENCOUNTER — Other Ambulatory Visit (HOSPITAL_COMMUNITY): Payer: Self-pay | Admitting: Psychiatry

## 2018-10-18 MED ORDER — LITHIUM CARBONATE 600 MG PO CAPS: 600.0000 mg | ORAL_CAPSULE | Freq: Two times a day (BID) | ORAL | 0 refills | Status: DC

## 2018-10-25 NOTE — Progress Notes (Signed)
[place] MD/PA/NP OP Progress Note  10/30/2018 9:07 AM Miranda Harris  MRN:  [id number]  Chief Complaint:  Chief Complaint    Follow-up; Other     HPI:  Patient presents for follow-up appointment for bipolar disorder.  She states that she has not noticed any change since switching to Lexapro.  Although she does not feel fatigue so much as she used to, she feels depressed.  She has had issues with concentration at work, and she really needs to pay attention to what she does.  She had a good time during holiday season; she enjoyed inviting her friends.  She states that she is social when she is with other people, and she enjoys it.  She believes that she does not need as therapy as she has good family, and she does not dwell in the past.  Although she has lower self-esteem, she attributes it to medication, and she believes she would feel better if we were to change the medication.  She looked in the website and would like to try Paxil.  She sleeps 8 hours.  She denies having significant fatigue.  She has good appetite, eats healthier food.  She has been more active at work, and has lost some weight.  She denies SI.  She denies anxiety or panic attacks.  She denies decreased need for sleep or euphoria.   Wt Readings from Last 3 Encounters:  10/30/18 199 lb (90.3 kg)  08/29/18 202 lb (91.6 kg)  07/23/18 203 lb (92.1 kg)    Visit Diagnosis:    ICD-10-CM   1. Bipolar I disorder, most recent episode depressed ([place]) F31.30     Past Psychiatric History: Please see initial evaluation for full details. I have reviewed the history. No updates at this time.     Past Medical History:  Past Medical History:  Diagnosis Date  . Bipolar disorder ([place])   . Elevated serum creatinine    related to lithium use  . Helicobacter pylori (H. pylori) infection    pt denies  . Hypothyroidism   . Perirectal fistula   . Thyroid disease   . Trochanteric bursitis of right hip     Past Surgical History:  Procedure  Laterality Date  . EVALUATION UNDER ANESTHESIA WITH HEMORRHOIDECTOMY N/A 06/28/2017   Procedure: EXAM UNDER ANESTHESIA WITH EXTERNAL HEMORRHOIDECTOMY AND REPAIR OF PERIRECTAL FISTULA;  Surgeon: Michael Boston, MD;  Location: [facility];  Service: General;  Laterality: N/A;  . HERNIA REPAIR  3-87 yrs ago   umbilical hernia  . INCISION AND DRAINAGE PERIRECTAL ABSCESS N/A 04/18/2017   Procedure: IRRIGATION AND DEBRIDEMENT PERIRECTAL ABSCESS, placement of seton, sigmoidoscopy;  Surgeon: Excell Seltzer, MD;  Location: [facility];  Service: General;  Laterality: N/A;  . LAPAROSCOPIC APPENDECTOMY    . VAGINAL HYSTERECTOMY Bilateral    complete  . WISDOM TOOTH EXTRACTION      Family Psychiatric History: Please see initial evaluation for full details. I have reviewed the history. No updates at this time.     Family History:  Family History  Problem Relation Age of Onset  . Anxiety disorder Brother     Social History:  Social History   Socioeconomic History  . Marital status: Married    Spouse name: Not on file  . Number of children: Not on file  . Years of education: Not on file  . Highest education level: Not on file  Occupational History  . Not on file  Social Needs  . Financial resource strain:  Not on file  . Food insecurity:    Worry: Not on file    Inability: Not on file  . Transportation needs:    Medical: Not on file    Non-medical: Not on file  Tobacco Use  . Smoking status: Former Smoker    Packs/day: 1.00    Years: 30.00    Pack years: 30.00    Types: Cigarettes  . Smokeless tobacco: Never Used  . Tobacco comment: quit 2008  Substance and Sexual Activity  . Alcohol use: No    Alcohol/week: 0.0 standard drinks    Comment: Occasional use  . Drug use: No  . Sexual activity: Yes    Partners: Male    Birth control/protection: Surgical  Lifestyle  . Physical activity:    Days per week: Not on file    Minutes per session: Not on file  . Stress: Not  on file  Relationships  . Social connections:    Talks on phone: Not on file    Gets together: Not on file    Attends religious service: Not on file    Active member of club or organization: Not on file    Attends meetings of clubs or organizations: Not on file    Relationship status: Not on file  Other Topics Concern  . Not on file  Social History Narrative  . Not on file    Allergies: No Known Allergies  Metabolic Disorder Labs: Lab Results  Component Value Date   HGBA1C 5.4 06/10/2015   MPG 108 06/10/2015   No results found for: PROLACTIN No results found for: CHOL, TRIG, HDL, CHOLHDL, VLDL, LDLCALC No results found for: TSH  Therapeutic Level Labs: Lab Results  Component Value Date   LITHIUM 0.80 06/10/2015   No results found for: VALPROATE No components found for:  CBMZ  Current Medications: Current Outpatient Medications  Medication Sig Dispense Refill  . Calcium Carbonate-Vitamin D (CALTRATE 600+D PO) Take 2 tablets by mouth daily.    [name] escitalopram (LEXAPRO) 20 MG tablet Take 10 mg daily for one week, then 20 mg daily 90 tablet 0  . levothyroxine (SYNTHROID, LEVOTHROID) 137 MCG tablet Take 137 mcg by mouth daily before breakfast.    . lithium 600 MG capsule Take 1 capsule (600 mg total) by mouth 2 (two) times daily with a meal. 180 capsule 0  . Multiple Vitamin (MULTIVITAMIN WITH MINERALS) TABS tablet Take 1 tablet by mouth daily.    . Omega-3 Fatty Acids (FISH OIL) 1000 MG CAPS Take 2,000 mg by mouth daily.    [name] QUERCETIN PO Take 1 capsule by mouth 2 (two) times daily.    . QUEtiapine (SEROQUEL) 100 MG tablet Take 1.5 tablets (150 mg total) by mouth at bedtime. 135 tablet 0  . PARoxetine (PAXIL) 20 MG tablet 10 mg daily for one week, then 20 mg daily 30 tablet 1   No current facility-administered medications for this visit.      Musculoskeletal: Strength & Muscle Tone: within normal limits Gait & Station: normal Patient leans: N/A  Psychiatric  Specialty Exam: Review of Systems  Psychiatric/Behavioral: Positive for depression. Negative for hallucinations, memory loss, substance abuse and suicidal ideas. The patient has insomnia. The patient is not nervous/anxious.   All other systems reviewed and are negative.   Blood pressure 134/82, pulse 71, height 5\' 9"  (1.753 m), weight 199 lb (90.3 kg), SpO2 100 %.Body mass index is 29.39 kg/m.  General Appearance: Fairly Groomed  Eye Contact:  Good  Speech:  Clear and Coherent  Volume:  Normal  Mood:  Depressed  Affect:  Appropriate, Congruent and fatigue  Thought Process:  Coherent  Orientation:  Full (Time, Place, and Person)  Thought Content: Logical   Suicidal Thoughts:  No  Homicidal Thoughts:  No  Memory:  Immediate;   Good  Judgement:  Good  Insight:  Good  Psychomotor Activity:  Normal  Concentration:  Concentration: Good and Attention Span: Good  Recall:  Good  Fund of Knowledge: Good  Language: Good  Akathisia:  No  Handed:  Right  AIMS (if indicated): not done  Assets:  Communication Skills Desire for Improvement  ADL's:  Intact  Cognition: WNL  Sleep:  Good   Screenings: MOCA 24/30 (-1 for visuospatial, -2 for attention, -3 for delayed recall)09/2017  Assessment and Plan:  Chasty Randal is a 58 y.o. year old female with a history of bipolar I disorder, anxiety, alcohol use disorder in sustained remission, hypothyroidism  , who presents for follow up appointment for Bipolar I disorder, most recent episode depressed ([place])  # Bipolar I disorder Patient continues to report depressive symptoms without significant trigger.  She has had no significant benefit from switching from Effexor to Lexapro.  Will switch to Paxil to target depression.  Will continue to monitor hair loss, which she has started to notice since being on Effexor.  Discussed potential side effect of serotonin syndrome.  Will continue quetiapine for bipolar depression.  Discussed potential metabolic  side effect and drowsiness.  Will continue lithium for bipolar disorder; she is encouraged to recheck lithium and creatinine by PCP.  Noted that although she will benefit from CBT, patient declines this option and is more interested in pharmacological treatment.   Plan:  I have reviewed and updated plans as below 1. Decrease lexapro 10 mg daily for one week, then discontinue 2. Start Paxil 10 mg daily for one week, then 20 mg daily  3. Continue lithium 600 mg twice a day 4. Continue quetiapine 150 mg at night 5. Return to clinic inone month for 30 mins 6. Recheck lithium level, kidney function  Labs reviewed. 10/09/2018 CBC wnl BUN 34, Cre 1.59 (to be rechecked by PCP) Vt B12, TSH 3.27 Li 1.2  Past trials of medication: Sertraline, fluoxetine (drowsiness),Lexapro,Duloxetine, venlafaxine (initially worked well, stopped working, hair loss), Wellbutrin(insomnia),Latuda, Abilify (flu like symptoms), Haldol (EPS), Thorazine (EPS), Stelazine,  Lithium, Lunesta (sedation), Ambien (sleepwalking, driving, eating), Trazodone (excessive sedation), Xanax  The patient demonstrates the following risk factors for suicide: Chronic risk factorsfor suicide include psychiatric disorder /bipolar disorder,history of substance use disorder, family history of suicide attempt. Acute risk factorsfor suicide includenone.Protective factorsfor this patient include positive social support, responsibility to others (family), coping skills, hope for the future. Although patient reports passive SI, she adamantly denies any intent and has had no previous suicide attempt.Considering these factors, the overall suicide risk at this point appears to be low. Discussed emergency resources including calling 911, suicide crisis line and coming to ED. Patient is advised to call the clinic for earlier appointment if any worsening in her symptoms.  The duration of this appointment visit was 25 minutes of face-to-face time  with the patient.  Greater than 50% of this time was spent in counseling, explanation of  diagnosis, planning of further management, and coordination of care.  [name], MD 10/30/2018, 9:07 AM

## 2018-10-30 ENCOUNTER — Encounter

## 2018-10-30 ENCOUNTER — Ambulatory Visit (INDEPENDENT_AMBULATORY_CARE_PROVIDER_SITE_OTHER): Payer: 59 | Admitting: Psychiatry

## 2018-10-30 ENCOUNTER — Encounter (HOSPITAL_COMMUNITY): Payer: Self-pay | Admitting: Psychiatry

## 2018-10-30 VITALS — BP 134/82 | HR 71 | Ht 69.0 in | Wt 199.0 lb

## 2018-10-30 DIAGNOSIS — F3130 Bipolar disorder, current episode depressed, mild or moderate severity, unspecified: Secondary | ICD-10-CM

## 2018-10-30 MED ORDER — PAROXETINE HCL 20 MG PO TABS: ORAL_TABLET | ORAL | 1 refills | Status: DC

## 2018-10-30 NOTE — Patient Instructions (Signed)
1. Decrease lexapro 10 mg daily for one week, then discontinue 2. Start Paxil 10 mg daily for one week, then 20 mg daily  3. Continue lithium 600 mg twice a day 4. Continue quetiapine 150 mg at night 5. Return to clinic inone month for 30 mins 6. Recheck lithium level, kidney function

## 2018-11-06 ENCOUNTER — Ambulatory Visit (INDEPENDENT_AMBULATORY_CARE_PROVIDER_SITE_OTHER): Payer: 59 | Admitting: Internal Medicine

## 2018-11-06 ENCOUNTER — Encounter: Payer: Self-pay | Admitting: Internal Medicine

## 2018-11-06 VITALS — BP 130/84 | HR 75 | Temp 97.9°F | Wt 200.0 lb

## 2018-11-06 DIAGNOSIS — F339 Major depressive disorder, recurrent, unspecified: Secondary | ICD-10-CM | POA: Diagnosis not present

## 2018-11-06 DIAGNOSIS — Z8639 Personal history of other endocrine, nutritional and metabolic disease: Secondary | ICD-10-CM

## 2018-11-06 DIAGNOSIS — M25531 Pain in right wrist: Secondary | ICD-10-CM | POA: Diagnosis not present

## 2018-11-06 DIAGNOSIS — M25541 Pain in joints of right hand: Secondary | ICD-10-CM | POA: Diagnosis not present

## 2018-11-06 DIAGNOSIS — E039 Hypothyroidism, unspecified: Secondary | ICD-10-CM

## 2018-11-06 DIAGNOSIS — R5383 Other fatigue: Secondary | ICD-10-CM

## 2018-11-06 DIAGNOSIS — M25542 Pain in joints of left hand: Secondary | ICD-10-CM

## 2018-11-06 DIAGNOSIS — M25532 Pain in left wrist: Secondary | ICD-10-CM | POA: Diagnosis not present

## 2018-11-06 LAB — TSH: TSH: 3.04 u[IU]/mL (ref 0.35–4.50)

## 2018-11-06 NOTE — Patient Instructions (Addendum)
Great meeting you today.  Instructions: -draw labs today and will notify you with results -will look forward to seeing you on Thursday afternoon  Joint Pain  Joint pain can be caused by many things. It is likely to go away if you follow instructions from your doctor for taking care of yourself at home. Sometimes, you may need more treatment. Follow these instructions at home: Managing pain, stiffness, and swelling   If told, put ice on the painful area. ? Put ice in a plastic bag. ? Place a towel between your skin and the bag. ? Leave the ice on for 20 minutes, 2-3 times a day.  If told, put heat on the painful area. Do this as often as told by your doctor. Use the heat source that your doctor recommends, such as a moist heat pack or a heating pad. ? Place a towel between your skin and the heat source. ? Leave the heat on for 20-30 minutes. ? Take off the heat if your skin gets bright red. This is especially important if you are unable to feel pain, heat, or cold. You may have a greater risk of getting burned.  Move your fingers or toes below the painful joint often. This helps with stiffness and swelling.  If possible, raise (elevate) the painful joint above the level of your heart while you are sitting or lying down. To do this, try putting a few pillows under the painful joint. Activity  Rest the painful joint for as long as told. Do not do things that cause pain or make your pain worse.  Begin exercising or stretching the affected area, as told by your doctor. Ask your doctor what types of exercise are safe for you. If you have an elastic bandage, sling, or splint:  Wear the device as told by your doctor. Take it off only as told by your doctor.  Loosen the device if your fingers or toes below the joint: ? Tingle. ? Lose feeling (get numb). ? Get cold and blue.  Keep the device clean.  Ask your doctor if you should take off the device before bathing. You may need to  cover it with a watertight covering when you take a bath or a shower. General instructions  Take over-the-counter and prescription medicines only as told by your doctor.  Do not use any products that contain nicotine or tobacco. These include cigarettes and e-cigarettes. If you need help quitting, ask your doctor.  Keep all follow-up visits as told by your doctor. This is important. Contact a doctor if:  You have pain that gets worse and does not get better with medicine.  Your joint pain does not get better in 3 days.  You have more bruising or swelling.  You have a fever.  You lose 10 lb (4.5 kg) or more without trying. Get help right away if:  You cannot move the joint.  Your fingers or toes tingle, lose feeling, or get cold and blue.  You have a fever along with a joint that is red, warm, and swollen. Summary  Joint pain can be caused by many things. It often goes away if you follow instructions from your doctor for taking care of yourself at home.  Rest the painful joint for as long as told. Do not do things that cause pain or make your pain worse.  Take over-the-counter and prescription medicines only as told by your doctor. This information is not intended to replace advice given to you  by your health care provider. Make sure you discuss any questions you have with your health care provider. Document Released: 08/31/2009 Document Revised: 06/28/2017 Document Reviewed: 06/28/2017 Elsevier Interactive Patient Education  2019 [employer].

## 2018-11-06 NOTE — Progress Notes (Signed)
New Patient Office Visit     CC/Reason for Visit: joint pain in hands  HPI: Miranda Harris is a 58 y.o. female who is coming in today for the above mentioned reasons. Past Medical History is significant for hypothyroidism, bipolar and depression.  Patient sees psych for bipolar and depression on a regular basis and they have been changing medications a lot recently.  She is currently on paxil 20 mg daily.  Today she c/o bilateral finger pain of index, middle and ring fingers of both hands that started 1 year ago, but the last couple of months her left wrist has been aching.  She sts the pain is constant, she takes advil with no relief and activity increases her wrist pain.  She says that heat helps for a short period of time and ice makes the pain worse.  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  She might have a h/o B12 deficiency, but is not sure.   Past Medical/Surgical History: Past Medical History:  Diagnosis Date  . Bipolar disorder ([place])   . Elevated serum creatinine    related to lithium use  . Helicobacter pylori (H. pylori) infection    pt denies  . Hypothyroidism   . Perirectal fistula   . Thyroid disease   . Trochanteric bursitis of right hip     Past Surgical History:  Procedure Laterality Date  . EVALUATION UNDER ANESTHESIA WITH HEMORRHOIDECTOMY N/A 06/28/2017   Procedure: EXAM UNDER ANESTHESIA WITH EXTERNAL HEMORRHOIDECTOMY AND REPAIR OF PERIRECTAL FISTULA;  Surgeon: Michael Boston, MD;  Location: [facility];  Service: General;  Laterality: N/A;  . HERNIA REPAIR  1-61 yrs ago   umbilical hernia  . INCISION AND DRAINAGE PERIRECTAL ABSCESS N/A 04/18/2017   Procedure: IRRIGATION AND DEBRIDEMENT PERIRECTAL ABSCESS, placement of seton, sigmoidoscopy;  Surgeon: Excell Seltzer, MD;  Location: [facility];  Service: General;  Laterality: N/A;  . LAPAROSCOPIC APPENDECTOMY    . VAGINAL HYSTERECTOMY Bilateral    complete  . WISDOM TOOTH EXTRACTION      Social History:  reports that she has quit smoking. Her smoking use included cigarettes. She has a 30.00 pack-year smoking history. She has never used smokeless tobacco. She reports that she does not drink alcohol or use drugs.  Allergies: No Known Allergies  Family History:  Family History  Problem Relation Age of Onset  . Anxiety disorder Brother      Current Outpatient Medications:  .  Calcium Carbonate-Vitamin D (CALTRATE 600+D PO), Take 2 tablets by mouth daily., Disp: , Rfl:  .  levothyroxine (SYNTHROID, LEVOTHROID) 137 MCG tablet, Take 137 mcg by mouth daily before breakfast., Disp: , Rfl:  .  lithium 600 MG capsule, Take 1 capsule (600 mg total) by mouth 2 (two) times daily with a meal., Disp: 180 capsule, Rfl: 0 .  Multiple Vitamin (MULTIVITAMIN WITH MINERALS) TABS tablet, Take 1 tablet by mouth daily., Disp: , Rfl:  .  Omega-3 Fatty Acids (FISH OIL) 1000 MG CAPS, Take 2,000 mg by mouth daily., Disp: , Rfl:  .  PARoxetine (PAXIL) 20 MG tablet, 10 mg daily for one week, then 20 mg daily, Disp: 30 tablet, Rfl: 1 .  QUERCETIN PO, Take 1 capsule by mouth 2 (two) times daily., Disp: , Rfl:  .  QUEtiapine (SEROQUEL) 100 MG tablet, Take 1.5 tablets (150 mg total) by mouth at bedtime., Disp: 135 tablet, Rfl: 0  Review of Systems:  Constitutional:  Denies fever, chills, diaphoresis, appetite change and fatigue.  HEENT: Denies photophobia, eye pain, redness, hearing loss, ear pain, congestion, sore throat, rhinorrhea, sneezing, mouth sores, trouble swallowing, neck pain, neck stiffness and tinnitus.   Respiratory: Denies SOB, DOE, cough, chest tightness,  and wheezing.   Cardiovascular: Denies chest pain, palpitations and leg swelling.  Gastrointestinal: Denies nausea, vomiting, abdominal pain, diarrhea, constipation, blood in stool and abdominal distention.  Genitourinary: Denies dysuria, urgency, frequency, hematuria, flank pain and difficulty  urinating.  Endocrine: Denies: hot or cold intolerance, sweats, changes in hair or nails, polyuria, polydipsia. Musculoskeletal: c/o myalgias in bilateral hands, ; denies back pain, joint swelling, arthralgias and gait problem.  Skin: Denies pallor, rash and wound.  Neurological: Denies dizziness, seizures, syncope, weakness, light-headedness, numbness and headaches.  Hematological: Denies adenopathy. Easy bruising, personal or family bleeding history  Psychiatric/Behavioral: Denies suicidal ideation, mood changes, confusion, nervousness, sleep disturbance and agitation; c/o depression   Physical Exam: Vitals:   11/06/18 1422  BP: 130/84  Pulse: 75  Temp: 97.9 F (36.6 C)  TempSrc: Oral  SpO2: 99%  Weight: 200 lb (90.7 kg)    Body mass index is 29.53 kg/m.   Constitutional: NAD, calm, comfortable Eyes: PERRL, lids and conjunctivae normal Respiratory: clear to auscultation bilaterally, no wheezing, no crackles. Normal respiratory effort. No accessory muscle use.  Cardiovascular: Regular rate and rhythm, no murmurs / rubs / gallops. No extremity edema. 2+ pedal pulses.  Musculoskeletal: no clubbing / cyanosis. No joint deformity upper and lower extremities. Good ROM, no contractures. Normal muscle tone.  Psychiatric: Normal judgment and insight. Alert and oriented x 3. Seems depressed   Impression and Plan:  Arthralgia of both hands and wrists -will draw TSH & B12 looking for potential etiologies. -No visible joint abnormalities. -If above labs normal, can consider depression as a possible etiology (?if she has ever been on cymbalta); can also consider rheumatology referral.  Hypothyroidism, unspecified type -  -check TSH -last TSH was abnormal  Depression, recurrent ([place]) -will follow up with psych and discuss medication management -she feels her mood is stable    Patient Instructions  Great meeting you today.  Instructions: -draw labs today and will notify you  with results -will look forward to seeing you on Thursday afternoon  Joint Pain  Joint pain can be caused by many things. It is likely to go away if you follow instructions from your doctor for taking care of yourself at home. Sometimes, you may need more treatment. Follow these instructions at home: Managing pain, stiffness, and swelling   If told, put ice on the painful area. ? Put ice in a plastic bag. ? Place a towel between your skin and the bag. ? Leave the ice on for 20 minutes, 2-3 times a day.  If told, put heat on the painful area. Do this as often as told by your doctor. Use the heat source that your doctor recommends, such as a moist heat pack or a heating pad. ? Place a towel between your skin and the heat source. ? Leave the heat on for 20-30 minutes. ? Take off the heat if your skin gets bright red. This is especially important if you are unable to feel pain, heat, or cold. You may have a greater risk of getting burned.  Move your fingers or toes below the painful joint often. This helps with stiffness and swelling.  If possible, raise (elevate) the painful joint above the level of your heart while you are  sitting or lying down. To do this, try putting a few pillows under the painful joint. Activity  Rest the painful joint for as long as told. Do not do things that cause pain or make your pain worse.  Begin exercising or stretching the affected area, as told by your doctor. Ask your doctor what types of exercise are safe for you. If you have an elastic bandage, sling, or splint:  Wear the device as told by your doctor. Take it off only as told by your doctor.  Loosen the device if your fingers or toes below the joint: ? Tingle. ? Lose feeling (get numb). ? Get cold and blue.  Keep the device clean.  Ask your doctor if you should take off the device before bathing. You may need to cover it with a watertight covering when you take a bath or a shower. General  instructions  Take over-the-counter and prescription medicines only as told by your doctor.  Do not use any products that contain nicotine or tobacco. These include cigarettes and e-cigarettes. If you need help quitting, ask your doctor.  Keep all follow-up visits as told by your doctor. This is important. Contact a doctor if:  You have pain that gets worse and does not get better with medicine.  Your joint pain does not get better in 3 days.  You have more bruising or swelling.  You have a fever.  You lose 10 lb (4.5 kg) or more without trying. Get help right away if:  You cannot move the joint.  Your fingers or toes tingle, lose feeling, or get cold and blue.  You have a fever along with a joint that is red, warm, and swollen. Summary  Joint pain can be caused by many things. It often goes away if you follow instructions from your doctor for taking care of yourself at home.  Rest the painful joint for as long as told. Do not do things that cause pain or make your pain worse.  Take over-the-counter and prescription medicines only as told by your doctor. This information is not intended to replace advice given to you by your health care provider. Make sure you discuss any questions you have with your health care provider. Document Released: 08/31/2009 Document Revised: 06/28/2017 Document Reviewed: 06/28/2017 Elsevier Interactive Patient Education  2019 [place], RN DNP Student [facility]

## 2018-11-08 ENCOUNTER — Encounter: Payer: Self-pay | Admitting: Internal Medicine

## 2018-11-08 ENCOUNTER — Ambulatory Visit (INDEPENDENT_AMBULATORY_CARE_PROVIDER_SITE_OTHER): Payer: 59 | Admitting: Internal Medicine

## 2018-11-08 VITALS — BP 120/80 | HR 73 | Temp 98.9°F | Ht 69.0 in | Wt 199.0 lb

## 2018-11-08 DIAGNOSIS — G5602 Carpal tunnel syndrome, left upper limb: Secondary | ICD-10-CM | POA: Diagnosis not present

## 2018-11-08 DIAGNOSIS — Z23 Encounter for immunization: Secondary | ICD-10-CM

## 2018-11-08 DIAGNOSIS — E039 Hypothyroidism, unspecified: Secondary | ICD-10-CM

## 2018-11-08 DIAGNOSIS — F3130 Bipolar disorder, current episode depressed, mild or moderate severity, unspecified: Secondary | ICD-10-CM

## 2018-11-08 LAB — BASIC METABOLIC PANEL
BUN: 32 mg/dL — ABNORMAL HIGH (ref 6–23)
CO2: 26 mEq/L (ref 19–32)
Calcium: 10 mg/dL (ref 8.4–10.5)
Chloride: 106 mEq/L (ref 96–112)
Creatinine, Ser: 1.32 mg/dL — ABNORMAL HIGH (ref 0.40–1.20)
GFR: 41.21 mL/min — ABNORMAL LOW (ref >60.00)
Glucose, Bld: 80 mg/dL (ref 70–99)
Potassium: 4.6 mEq/L (ref 3.5–5.1)
Sodium: 137 mEq/L (ref 135–145)

## 2018-11-08 LAB — VITAMIN B12: Vitamin B-12: 710 pg/mL (ref 211–911)

## 2018-11-08 MED ORDER — LEVOTHYROXINE SODIUM 150 MCG PO TABS: 150.0000 ug | ORAL_TABLET | Freq: Every day | ORAL | 3 refills | Status: DC

## 2018-11-08 NOTE — Addendum Note (Signed)
Addended by: Elmer Picker on: 11/08/2018 02:34 PM   Modules accepted: Orders

## 2018-11-08 NOTE — Addendum Note (Signed)
Addended by: Westley Hummer B on: 11/08/2018 03:01 PM   Modules accepted: Orders

## 2018-11-08 NOTE — Patient Instructions (Signed)
-Nice seeing you today!  -Wear left wrist splint every night and as much as you can tolerate during the day. Ice as needed. Let me know if no improvement.  -Lab work today. Will notify you when results are available.   Carpal Tunnel Syndrome  Carpal tunnel syndrome is a condition that causes pain in your hand and arm. The carpal tunnel is a narrow area that is on the palm side of your wrist. Repeated wrist motion or certain diseases may cause swelling in the tunnel. This swelling can pinch the main nerve in the wrist (median nerve). What are the causes? This condition may be caused by:  Repeated wrist motions.  Wrist injuries.  Arthritis.  A sac of fluid (cyst) or abnormal growth (tumor) in the carpal tunnel.  Fluid buildup during pregnancy. Sometimes the cause is not known. What increases the risk? The following factors may make you more likely to develop this condition:  Having a job in which you move your wrist in the same way many times. This includes jobs like being a Software engineer or a Scientist, water quality.  Being a woman.  Having other health conditions, such as: ? Diabetes. ? Obesity. ? A thyroid gland that is not active enough (hypothyroidism). ? Kidney failure. What are the signs or symptoms? Symptoms of this condition include:  A tingling feeling in your fingers.  Tingling or a loss of feeling (numbness) in your hand.  Pain in your entire arm. This pain may get worse when you bend your wrist and elbow for a long time.  Pain in your wrist that goes up your arm to your shoulder.  Pain that goes down into your palm or fingers.  A weak feeling in your hands. You may find it hard to grab and hold items. You may feel worse at night. How is this diagnosed? This condition is diagnosed with a medical history and physical exam. You may also have tests, such as:  Electromyogram (EMG). This test checks the signals that the nerves send to the muscles.  Nerve conduction study. This  test checks how well signals pass through your nerves.  Imaging tests, such as X-rays, ultrasound, and MRI. These tests check for what might be the cause of your condition. How is this treated? This condition may be treated with:  Lifestyle changes. You will be asked to stop or change the activity that caused your problem.  Doing exercise and activities that make bones and muscles stronger (physical therapy).  Learning how to use your hand again (occupational therapy).  Medicines for pain and swelling (inflammation). You may have injections in your wrist.  A wrist splint.  Surgery. Follow these instructions at home: If you have a splint:  Wear the splint as told by your doctor. Remove it only as told by your doctor.  Loosen the splint if your fingers: ? Tingle. ? Lose feeling (become numb). ? Turn cold and blue.  Keep the splint clean.  If the splint is not waterproof: ? Do not let it get wet. ? Cover it with a watertight covering when you take a bath or a shower. Managing pain, stiffness, and swelling   If told, put ice on the painful area: ? If you have a removable splint, remove it as told by your doctor. ? Put ice in a plastic bag. ? Place a towel between your skin and the bag. ? Leave the ice on for 20 minutes, 2-3 times per day. General instructions  Take over-the-counter and prescription  medicines only as told by your doctor.  Rest your wrist from any activity that may cause pain. If needed, talk with your boss at work about changes that can help your wrist heal.  Do any exercises as told by your doctor, physical therapist, or occupational therapist.  Keep all follow-up visits as told by your doctor. This is important. Contact a doctor if:  You have new symptoms.  Medicine does not help your pain.  Your symptoms get worse. Get help right away if:  You have very bad numbness or tingling in your wrist or hand. Summary  Carpal tunnel syndrome is a  condition that causes pain in your hand and arm.  It is often caused by repeated wrist motions.  Lifestyle changes and medicines are used to treat this problem. Surgery may help in very bad cases.  Follow your doctor's instructions about wearing a splint, resting your wrist, keeping follow-up visits, and calling for help. This information is not intended to replace advice given to you by your health care provider. Make sure you discuss any questions you have with your health care provider. Document Released: 09/01/2011 Document Revised: 01/19/2018 Document Reviewed: 01/19/2018 Elsevier Interactive Patient Education  2019 [employer].

## 2018-11-08 NOTE — Progress Notes (Signed)
Established Patient Office Visit     CC/Reason for Visit: Establish care, follow-up chronic conditions, concern about elevated lithium level, continued numbness and tingling of left fingers and wrist  HPI: Miranda Harris is a 58 y.o. female who is coming in today for the above mentioned reasons. Past Medical History is significant for: Bipolar disorder with depressed mood followed by psychiatry, Dr. Irine Seal; is on Paxil, lithium, quetiapine, she was recently on Effexor however this was transitioned over to Paxil 3 weeks ago because she was still depressed. She has hypothyroidism with a recent normal TSH. She has also been complaining of left wrist pain, sometimes above the wrist, but mostly with numbness and tingling of thumb, index, middle and ring finger. Sometimes her right wrist hurts but it is mainly the left.   Past Medical/Surgical History: Past Medical History:  Diagnosis Date  . Bipolar disorder ([place])   . Elevated serum creatinine    related to lithium use  . Helicobacter pylori (H. pylori) infection    pt denies  . Hypothyroidism   . Perirectal fistula   . Thyroid disease   . Trochanteric bursitis of right hip     Past Surgical History:  Procedure Laterality Date  . EVALUATION UNDER ANESTHESIA WITH HEMORRHOIDECTOMY N/A 06/28/2017   Procedure: EXAM UNDER ANESTHESIA WITH EXTERNAL HEMORRHOIDECTOMY AND REPAIR OF PERIRECTAL FISTULA;  Surgeon: Michael Boston, MD;  Location: [facility];  Service: General;  Laterality: N/A;  . HERNIA REPAIR  9-16 yrs ago   umbilical hernia  . INCISION AND DRAINAGE PERIRECTAL ABSCESS N/A 04/18/2017   Procedure: IRRIGATION AND DEBRIDEMENT PERIRECTAL ABSCESS, placement of seton, sigmoidoscopy;  Surgeon: Excell Seltzer, MD;  Location: [facility];  Service: General;  Laterality: N/A;  . LAPAROSCOPIC APPENDECTOMY    . VAGINAL HYSTERECTOMY Bilateral    complete  . WISDOM TOOTH EXTRACTION      Social History:  reports that she  has quit smoking. Her smoking use included cigarettes. She has a 30.00 pack-year smoking history. She has never used smokeless tobacco. She reports that she does not drink alcohol or use drugs.  Allergies: No Known Allergies  Family History:  Family History  Problem Relation Age of Onset  . Anxiety disorder Brother      Current Outpatient Medications:  .  Calcium Carbonate-Vitamin D (CALTRATE 600+D PO), Take 2 tablets by mouth daily., Disp: , Rfl:  .  levothyroxine (SYNTHROID, LEVOTHROID) 150 MCG tablet, TAKE 1 TABLET BY MOUTH EVERY DAY, Disp: , Rfl:  .  lithium 600 MG capsule, Take 1 capsule (600 mg total) by mouth 2 (two) times daily with a meal., Disp: 180 capsule, Rfl: 0 .  Multiple Vitamin (MULTIVITAMIN WITH MINERALS) TABS tablet, Take 1 tablet by mouth daily., Disp: , Rfl:  .  Omega-3 Fatty Acids (FISH OIL) 1000 MG CAPS, Take 2,000 mg by mouth daily., Disp: , Rfl:  .  PARoxetine (PAXIL) 20 MG tablet, 10 mg daily for one week, then 20 mg daily, Disp: 30 tablet, Rfl: 1 .  QUERCETIN PO, Take 1 capsule by mouth 2 (two) times daily., Disp: , Rfl:  .  QUEtiapine (SEROQUEL) 100 MG tablet, Take 1.5 tablets (150 mg total) by mouth at bedtime., Disp: 135 tablet, Rfl: 0  Review of Systems:  Constitutional: Denies fever, chills, diaphoresis, appetite change and fatigue.  HEENT: Denies photophobia, eye pain, redness, hearing loss, ear pain, congestion, sore throat, rhinorrhea, sneezing, mouth sores, trouble swallowing, neck pain, neck stiffness and tinnitus.  Respiratory: Denies SOB, DOE, cough, chest tightness,  and wheezing.   Cardiovascular: Denies chest pain, palpitations and leg swelling.  Gastrointestinal: Denies nausea, vomiting, abdominal pain, diarrhea, constipation, blood in stool and abdominal distention.  Genitourinary: Denies dysuria, urgency, frequency, hematuria, flank pain and difficulty urinating.  Endocrine: Denies: hot or cold intolerance, sweats, changes in hair or nails,  polyuria, polydipsia. Musculoskeletal: Denies myalgias, back pain, joint swelling, arthralgias and gait problem.  Skin: Denies pallor, rash and wound.  Neurological: Denies dizziness, seizures, syncope, weakness, light-headedness, numbness and headaches.  Hematological: Denies adenopathy. Easy bruising, personal or family bleeding history  Psychiatric/Behavioral: Denies suicidal ideation, mood changes, confusion, nervousness, sleep disturbance and agitation    Physical Exam: Vitals:   11/08/18 1334  BP: 120/80  Pulse: 73  Temp: 98.9 F (37.2 C)  TempSrc: Oral  SpO2: 99%  Weight: 199 lb (90.3 kg)  Height: 5\' 9"  (1.753 m)    Body mass index is 29.39 kg/m.   Constitutional: NAD, calm, comfortable Eyes: PERRL, lids and conjunctivae normal ENMT: Mucous membranes are moist.  Neck: normal, supple, no masses, no thyromegaly Respiratory: clear to auscultation bilaterally, no wheezing, no crackles. Normal respiratory effort. No accessory muscle use.  Cardiovascular: Regular rate and rhythm, no murmurs / rubs / gallops. No extremity edema. 2+ pedal pulses. No carotid bruits.  Musculoskeletal: no clubbing / cyanosis. No joint deformity upper and lower extremities. Good ROM, no contractures. Normal muscle tone.  Neurologic: CN 2-12 grossly intact. Sensation intact, DTR normal. Strength 5/5 in all 4.  Psychiatric: Normal judgment and insight. Alert and oriented x 3. Normal mood.    Impression and Plan:  Bipolar I disorder, most recent episode depressed ([facility]) -States she feels much better since starting on Paxil; she is followed frequently by psychiatry. -She is concerned that her lithium level might be high as she has been more fatigued and was told recently that her kidney tests were abnormal. -check lithium level and BMET today. If lithium abnormal, will discuss with her psychiatrist.  Adult hypothyroidism -Recent TSH abnormal. Will send Rx for synthroid.  Left carpal tunnel  syndrome -left wrist splint. -if no improvement with conservative measures will refer to ortho.    Patient Instructions  -Nice seeing you today!  -Wear left wrist splint every night and as much as you can tolerate during the day. Ice as needed. Let me know if no improvement.  -Lab work today. Will notify you when results are available.   Carpal Tunnel Syndrome  Carpal tunnel syndrome is a condition that causes pain in your hand and arm. The carpal tunnel is a narrow area that is on the palm side of your wrist. Repeated wrist motion or certain diseases may cause swelling in the tunnel. This swelling can pinch the main nerve in the wrist (median nerve). What are the causes? This condition may be caused by:  Repeated wrist motions.  Wrist injuries.  Arthritis.  A sac of fluid (cyst) or abnormal growth (tumor) in the carpal tunnel.  Fluid buildup during pregnancy. Sometimes the cause is not known. What increases the risk? The following factors may make you more likely to develop this condition:  Having a job in which you move your wrist in the same way many times. This includes jobs like being a Software engineer or a Scientist, water quality.  Being a woman.  Having other health conditions, such as: ? Diabetes. ? Obesity. ? A thyroid gland that is not active enough (hypothyroidism). ? Kidney failure. What are the signs  or symptoms? Symptoms of this condition include:  A tingling feeling in your fingers.  Tingling or a loss of feeling (numbness) in your hand.  Pain in your entire arm. This pain may get worse when you bend your wrist and elbow for a long time.  Pain in your wrist that goes up your arm to your shoulder.  Pain that goes down into your palm or fingers.  A weak feeling in your hands. You may find it hard to grab and hold items. You may feel worse at night. How is this diagnosed? This condition is diagnosed with a medical history and physical exam. You may also have tests, such  as:  Electromyogram (EMG). This test checks the signals that the nerves send to the muscles.  Nerve conduction study. This test checks how well signals pass through your nerves.  Imaging tests, such as X-rays, ultrasound, and MRI. These tests check for what might be the cause of your condition. How is this treated? This condition may be treated with:  Lifestyle changes. You will be asked to stop or change the activity that caused your problem.  Doing exercise and activities that make bones and muscles stronger (physical therapy).  Learning how to use your hand again (occupational therapy).  Medicines for pain and swelling (inflammation). You may have injections in your wrist.  A wrist splint.  Surgery. Follow these instructions at home: If you have a splint:  Wear the splint as told by your doctor. Remove it only as told by your doctor.  Loosen the splint if your fingers: ? Tingle. ? Lose feeling (become numb). ? Turn cold and blue.  Keep the splint clean.  If the splint is not waterproof: ? Do not let it get wet. ? Cover it with a watertight covering when you take a bath or a shower. Managing pain, stiffness, and swelling   If told, put ice on the painful area: ? If you have a removable splint, remove it as told by your doctor. ? Put ice in a plastic bag. ? Place a towel between your skin and the bag. ? Leave the ice on for 20 minutes, 2-3 times per day. General instructions  Take over-the-counter and prescription medicines only as told by your doctor.  Rest your wrist from any activity that may cause pain. If needed, talk with your boss at work about changes that can help your wrist heal.  Do any exercises as told by your doctor, physical therapist, or occupational therapist.  Keep all follow-up visits as told by your doctor. This is important. Contact a doctor if:  You have new symptoms.  Medicine does not help your pain.  Your symptoms get worse. Get  help right away if:  You have very bad numbness or tingling in your wrist or hand. Summary  Carpal tunnel syndrome is a condition that causes pain in your hand and arm.  It is often caused by repeated wrist motions.  Lifestyle changes and medicines are used to treat this problem. Surgery may help in very bad cases.  Follow your doctor's instructions about wearing a splint, resting your wrist, keeping follow-up visits, and calling for help. This information is not intended to replace advice given to you by your health care provider. Make sure you discuss any questions you have with your health care provider. Document Released: 09/01/2011 Document Revised: 01/19/2018 Document Reviewed: 01/19/2018 Elsevier Interactive Patient Education  2019 [employer].      Lelon Frohlich, MD   [facility]

## 2018-11-09 ENCOUNTER — Encounter: Payer: Self-pay | Admitting: Internal Medicine

## 2018-11-09 ENCOUNTER — Other Ambulatory Visit: Payer: Self-pay | Admitting: Internal Medicine

## 2018-11-09 DIAGNOSIS — N183 Chronic kidney disease, stage 3 unspecified: Secondary | ICD-10-CM | POA: Insufficient documentation

## 2018-11-09 LAB — LITHIUM LEVEL: Lithium Lvl: 1.2 mmol/L (ref 0.6–1.2)

## 2018-11-13 ENCOUNTER — Ambulatory Visit (INDEPENDENT_AMBULATORY_CARE_PROVIDER_SITE_OTHER): Payer: 59 | Admitting: Psychiatry

## 2018-11-13 ENCOUNTER — Encounter (HOSPITAL_COMMUNITY): Payer: Self-pay | Admitting: Psychiatry

## 2018-11-13 VITALS — BP 133/82 | HR 76 | Ht 69.0 in | Wt 200.0 lb

## 2018-11-13 DIAGNOSIS — F3130 Bipolar disorder, current episode depressed, mild or moderate severity, unspecified: Secondary | ICD-10-CM

## 2018-11-13 MED ORDER — LAMOTRIGINE 25 MG PO TABS: ORAL_TABLET | ORAL | 0 refills | Status: DC

## 2018-11-13 NOTE — Progress Notes (Signed)
BH MD/PA/NP OP Progress Note  11/13/2018 3:04 PM Miranda Harris  MRN:  [id number]  Chief Complaint:  Chief Complaint    Other; Depression; Follow-up     HPI:  Patient presents for follow-up appointment for bipolar 1 disorder.  She states that she has been feeling better after starting Paxil.  She feels more confident, and it has been easier for the patient to deal with things.  She has concern about lithium.  She looked up about the medication and wants to discontinue it for its potential side effect of decreased kidney function.  Although she was recommended by PCP to be referred to a nephrologist, she wants to hold this option as there might be a better solution.  She states that she has been on lithium for almost 30 years.  It was around the time she was admitted for worsening in depression at around 30's.  She denies suicide attempts, although she felt it could happen that time.  She sleeps well.  She feels less depressed.  She has fair concentration.  She denies SI.  She feels less anxious.  She denies panic attacks.  She denies decreased need for sleep or euphoria. She has not had manic symptoms for many years.   Visit Diagnosis:    ICD-10-CM   1. Bipolar I disorder, most recent episode depressed ([place]) F31.30     Past Psychiatric History: Please see initial evaluation for full details. I have reviewed the history. No updates at this time.     Past Medical History:  Past Medical History:  Diagnosis Date  . Bipolar disorder ([place])   . Elevated serum creatinine    related to lithium use  . Helicobacter pylori (H. pylori) infection    pt denies  . Hypothyroidism   . Perirectal fistula   . Thyroid disease   . Trochanteric bursitis of right hip     Past Surgical History:  Procedure Laterality Date  . EVALUATION UNDER ANESTHESIA WITH HEMORRHOIDECTOMY N/A 06/28/2017   Procedure: EXAM UNDER ANESTHESIA WITH EXTERNAL HEMORRHOIDECTOMY AND REPAIR OF PERIRECTAL FISTULA;  Surgeon:  Michael Boston, MD;  Location: [facility];  Service: General;  Laterality: N/A;  . HERNIA REPAIR  0-86 yrs ago   umbilical hernia  . INCISION AND DRAINAGE PERIRECTAL ABSCESS N/A 04/18/2017   Procedure: IRRIGATION AND DEBRIDEMENT PERIRECTAL ABSCESS, placement of seton, sigmoidoscopy;  Surgeon: Excell Seltzer, MD;  Location: [facility];  Service: General;  Laterality: N/A;  . LAPAROSCOPIC APPENDECTOMY    . VAGINAL HYSTERECTOMY Bilateral    complete  . WISDOM TOOTH EXTRACTION      Family Psychiatric History: Please see initial evaluation for full details. I have reviewed the history. No updates at this time.     Family History:  Family History  Problem Relation Age of Onset  . Anxiety disorder Brother     Social History:  Social History   Socioeconomic History  . Marital status: Married    Spouse name: Not on file  . Number of children: Not on file  . Years of education: Not on file  . Highest education level: Not on file  Occupational History  . Not on file  Social Needs  . Financial resource strain: Not on file  . Food insecurity:    Worry: Not on file    Inability: Not on file  . Transportation needs:    Medical: Not on file    Non-medical: Not on file  Tobacco Use  . Smoking status: Former  Smoker    Packs/day: 1.00    Years: 30.00    Pack years: 30.00    Types: Cigarettes  . Smokeless tobacco: Never Used  . Tobacco comment: quit 2008  Substance and Sexual Activity  . Alcohol use: No    Alcohol/week: 0.0 standard drinks    Comment: Occasional use  . Drug use: No  . Sexual activity: Yes    Partners: Male    Birth control/protection: Surgical  Lifestyle  . Physical activity:    Days per week: Not on file    Minutes per session: Not on file  . Stress: Not on file  Relationships  . Social connections:    Talks on phone: Not on file    Gets together: Not on file    Attends religious service: Not on file    Active member of club or  organization: Not on file    Attends meetings of clubs or organizations: Not on file    Relationship status: Not on file  Other Topics Concern  . Not on file  Social History Narrative  . Not on file    Allergies: No Known Allergies  Metabolic Disorder Labs: Lab Results  Component Value Date   HGBA1C 5.4 06/10/2015   MPG 108 06/10/2015   No results found for: PROLACTIN No results found for: CHOL, TRIG, HDL, CHOLHDL, VLDL, LDLCALC Lab Results  Component Value Date   TSH 3.04 11/06/2018    Therapeutic Level Labs: Lab Results  Component Value Date   LITHIUM 1.2 11/08/2018   LITHIUM 0.80 06/10/2015   No results found for: VALPROATE No components found for:  CBMZ  Current Medications: Current Outpatient Medications  Medication Sig Dispense Refill  . Calcium Carbonate-Vitamin D (CALTRATE 600+D PO) Take 2 tablets by mouth daily.    [name] levothyroxine (SYNTHROID, LEVOTHROID) 150 MCG tablet Take 1 tablet (150 mcg total) by mouth daily. 90 tablet 3  . lithium 600 MG capsule Take 1 capsule (600 mg total) by mouth 2 (two) times daily with a meal. 180 capsule 0  . Multiple Vitamin (MULTIVITAMIN WITH MINERALS) TABS tablet Take 1 tablet by mouth daily.    . Omega-3 Fatty Acids (FISH OIL) 1000 MG CAPS Take 2,000 mg by mouth daily.    [name] PARoxetine (PAXIL) 20 MG tablet 10 mg daily for one week, then 20 mg daily 30 tablet 1  . QUERCETIN PO Take 1 capsule by mouth 2 (two) times daily.    . QUEtiapine (SEROQUEL) 100 MG tablet Take 1.5 tablets (150 mg total) by mouth at bedtime. 135 tablet 0  . lamoTRIgine (LAMICTAL) 25 MG tablet 25 mg daily for two weeks, then 50 mg daily 60 tablet 0   No current facility-administered medications for this visit.      Musculoskeletal: Strength & Muscle Tone: within normal limits Gait & Station: normal Patient leans: N/A  Psychiatric Specialty Exam: Review of Systems  Psychiatric/Behavioral: Negative for depression, hallucinations, memory loss,  substance abuse and suicidal ideas. The patient is not nervous/anxious and does not have insomnia.   All other systems reviewed and are negative.   Blood pressure 133/82, pulse 76, height 5\' 9"  (1.753 m), weight 200 lb (90.7 kg), SpO2 100 %.Body mass index is 29.53 kg/m.  General Appearance: Fairly Groomed  Eye Contact:  Good  Speech:  Clear and Coherent  Volume:  Normal  Mood:  "better"  Affect:  Appropriate, Congruent and euthymic  Thought Process:  Coherent  Orientation:  Full (Time, Place, and Person)  Thought Content: Logical   Suicidal Thoughts:  No  Homicidal Thoughts:  No  Memory:  Immediate;   Good  Judgement:  Good  Insight:  Fair  Psychomotor Activity:  Normal  Concentration:  Concentration: Good and Attention Span: Good  Recall:  Good  Fund of Knowledge: Good  Language: Good  Akathisia:  No  Handed:  Right  AIMS (if indicated): not done  Assets:  Communication Skills Desire for Improvement  ADL's:  Intact  Cognition: WNL  Sleep:  Good   Screenings: PHQ2-9     Office Visit from 11/08/2018 in [facility] at [facility]  PHQ-2 Total Score  0       Assessment and Plan:  Miranda Harris is a 58 y.o. year old female with a history of bipolar I disorder, anxiety, alcohol use disorder in sustained remission, hypothyroidism , who presents for follow up appointment for Bipolar I disorder, most recent episode depressed ([place])  # Bipolar I disorder There has been significant improvement in depressive symptoms since switching from Lexapro to Paxil.  She is now concerned about potential side effect from lithium due to worsening in kidney function.  Will switch from lithium to lamotrigine to target bipolar 1 disorder.  Discussed potential risk of Stevens-Johnson syndrome.  Will continue quetiapine for bipolar disorder.  Discussed potential metabolic side effect.   Plan:  I have reviewed and updated plans as below 1. Continue Paxil 20 mg daily  2. Discontinue  lithium 3. From 2/19-Start lamotrigine 25 mg daily for two weeks, then 50 mg daily  4. Continue quetiapine 150 mg at night 5. Return to clinic inone month for 30 mins - will recheck BMP at the next visit  Labs reviewed. 10/09/2018 CBC wnl BUN 34, Cre 1.59 (to be rechecked by PCP); trending down to 1.32 on 11/08/2018 Vt B12, TSH 3.27 Li 1.2  Past trials of medication: Sertraline,fluoxetine (drowsiness),Lexapro,Duloxetine, venlafaxine (initially worked well, stopped working, hair loss), Wellbutrin(insomnia),Latuda, Abilify (flu like symptoms), Haldol (EPS), Thorazine (EPS), Stelazine,  Lithium, Lunesta (sedation), Ambien (sleepwalking, driving, eating), Trazodone (excessive sedation), Xanax  The patient demonstrates the following risk factors for suicide: Chronic risk factorsfor suicide include psychiatric disorder /bipolar disorder,history of substance use disorder, family history of suicide attempt. Acute risk factorsfor suicide includenone.Protective factorsfor this patient include positive social support, responsibility to others (family), coping skills, hope for the future. Although patient reports passive SI, she adamantly denies any intent and has had no previous suicide attempt.Considering these factors, the overall suicide risk at this point appears to be low. Discussed emergency resources including calling 911, suicide crisis line and coming to ED. Patient is advised to call the clinic for earlier appointment if any worsening in her symptoms.  The duration of this appointment visit was 25 minutes of face-to-face time with the patient.  Greater than 50% of this time was spent in counseling, explanation of  diagnosis, planning of further management, and coordination of care.  [name], MD 11/13/2018, 3:04 PM

## 2018-11-13 NOTE — Patient Instructions (Signed)
1. Continue paxil 20 mg daily  2. Discontinue lithium 3. Start lamotrigine 25 mg daily for two weeks, then 50 mg daily  4. Continue quetiapine 150 mg at night 5. Return to clinic inone month for 30 mins

## 2018-11-21 ENCOUNTER — Other Ambulatory Visit (HOSPITAL_COMMUNITY): Payer: Self-pay | Admitting: Psychiatry

## 2018-11-21 MED ORDER — QUETIAPINE FUMARATE 100 MG PO TABS: 150.0000 mg | ORAL_TABLET | Freq: Every day | ORAL | 0 refills | Status: DC

## 2018-11-22 ENCOUNTER — Ambulatory Visit (INDEPENDENT_AMBULATORY_CARE_PROVIDER_SITE_OTHER): Payer: 59 | Admitting: Internal Medicine

## 2018-11-22 ENCOUNTER — Encounter: Payer: Self-pay | Admitting: Internal Medicine

## 2018-11-22 VITALS — BP 140/100 | HR 78 | Temp 98.4°F | Ht 68.0 in | Wt 200.4 lb

## 2018-11-22 DIAGNOSIS — G5602 Carpal tunnel syndrome, left upper limb: Secondary | ICD-10-CM | POA: Insufficient documentation

## 2018-11-22 DIAGNOSIS — Z23 Encounter for immunization: Secondary | ICD-10-CM | POA: Diagnosis not present

## 2018-11-22 DIAGNOSIS — M8580 Other specified disorders of bone density and structure, unspecified site: Secondary | ICD-10-CM

## 2018-11-22 DIAGNOSIS — F3130 Bipolar disorder, current episode depressed, mild or moderate severity, unspecified: Secondary | ICD-10-CM

## 2018-11-22 DIAGNOSIS — E538 Deficiency of other specified B group vitamins: Secondary | ICD-10-CM

## 2018-11-22 DIAGNOSIS — R5383 Other fatigue: Secondary | ICD-10-CM

## 2018-11-22 DIAGNOSIS — Z0000 Encounter for general adult medical examination without abnormal findings: Secondary | ICD-10-CM

## 2018-11-22 NOTE — Addendum Note (Signed)
Addended by: Westley Hummer B on: 11/22/2018 01:15 PM   Modules accepted: Orders

## 2018-11-22 NOTE — Progress Notes (Signed)
Established Patient Office Visit     CC/Reason for Visit: CPE  HPI: Miranda Harris is a 58 y.o. female who is coming in today for the above mentioned reasons. Past Medical History is significant for: Bipolar disorder with depressed mood followed by psychiatry with a recent discontinuation of lithium and now on Lamictal, Paxil and quetiapine.  History of hypothyroidism on Synthroid, left carpal tunnel syndrome.  She is here today for her annual physical, has no acute complaints.  She has routine eye and dental care.  She is up-to-date on immunizations, have discussed shingles vaccine and she is willing to initiate series today.  She had a mammogram in July 2019, had a colonoscopy in 2017 and is due for repeat in 2022.  She has had a total hysterectomy and no longer requires cervical cancer screening.   Past Medical/Surgical History: Past Medical History:  Diagnosis Date  . Bipolar disorder ([place])   . Elevated serum creatinine    related to lithium use  . Helicobacter pylori (H. pylori) infection    pt denies  . Hypothyroidism   . Perirectal fistula   . Thyroid disease   . Trochanteric bursitis of right hip     Past Surgical History:  Procedure Laterality Date  . EVALUATION UNDER ANESTHESIA WITH HEMORRHOIDECTOMY N/A 06/28/2017   Procedure: EXAM UNDER ANESTHESIA WITH EXTERNAL HEMORRHOIDECTOMY AND REPAIR OF PERIRECTAL FISTULA;  Surgeon: Michael Boston, MD;  Location: [facility];  Service: General;  Laterality: N/A;  . HERNIA REPAIR  8-65 yrs ago   umbilical hernia  . INCISION AND DRAINAGE PERIRECTAL ABSCESS N/A 04/18/2017   Procedure: IRRIGATION AND DEBRIDEMENT PERIRECTAL ABSCESS, placement of seton, sigmoidoscopy;  Surgeon: Excell Seltzer, MD;  Location: [facility];  Service: General;  Laterality: N/A;  . LAPAROSCOPIC APPENDECTOMY    . VAGINAL HYSTERECTOMY Bilateral    complete  . WISDOM TOOTH EXTRACTION      Social History:  reports that she has quit  smoking. Her smoking use included cigarettes. She has a 30.00 pack-year smoking history. She has never used smokeless tobacco. She reports that she does not drink alcohol or use drugs.  Allergies: No Known Allergies  Family History:  Family History  Problem Relation Age of Onset  . Anxiety disorder Brother      Current Outpatient Medications:  .  Calcium Carbonate-Vitamin D (CALTRATE 600+D PO), Take 2 tablets by mouth daily., Disp: , Rfl:  .  lamoTRIgine (LAMICTAL) 25 MG tablet, 25 mg daily for two weeks, then 50 mg daily, Disp: 60 tablet, Rfl: 0 .  levothyroxine (SYNTHROID, LEVOTHROID) 150 MCG tablet, Take 1 tablet (150 mcg total) by mouth daily., Disp: 90 tablet, Rfl: 3 .  Multiple Vitamin (MULTIVITAMIN WITH MINERALS) TABS tablet, Take 1 tablet by mouth daily., Disp: , Rfl:  .  Omega-3 Fatty Acids (FISH OIL) 1000 MG CAPS, Take 2,000 mg by mouth daily., Disp: , Rfl:  .  PARoxetine (PAXIL) 20 MG tablet, 10 mg daily for one week, then 20 mg daily, Disp: 30 tablet, Rfl: 1 .  QUERCETIN PO, Take 1 capsule by mouth 2 (two) times daily., Disp: , Rfl:  .  [START ON 11/23/2018] QUEtiapine (SEROQUEL) 100 MG tablet, Take 1.5 tablets (150 mg total) by mouth at bedtime., Disp: 135 tablet, Rfl: 0  Review of Systems:  Constitutional: Denies fever, chills, diaphoresis, appetite change and fatigue.  HEENT: Denies photophobia, eye pain, redness, hearing loss, ear pain, congestion, sore throat, rhinorrhea, sneezing, mouth sores, trouble swallowing,  neck pain, neck stiffness and tinnitus.   Respiratory: Denies SOB, DOE, cough, chest tightness,  and wheezing.   Cardiovascular: Denies chest pain, palpitations and leg swelling.  Gastrointestinal: Denies nausea, vomiting, abdominal pain, diarrhea, constipation, blood in stool and abdominal distention.  Genitourinary: Denies dysuria, urgency, frequency, hematuria, flank pain and difficulty urinating.  Endocrine: Denies: hot or cold intolerance, sweats, changes  in hair or nails, polyuria, polydipsia. Musculoskeletal: Denies myalgias, back pain, joint swelling, arthralgias and gait problem.  Skin: Denies pallor, rash and wound.  Neurological: Denies dizziness, seizures, syncope, weakness, light-headedness, numbness and headaches.  Hematological: Denies adenopathy. Easy bruising, personal or family bleeding history  Psychiatric/Behavioral: Denies suicidal ideation, mood changes, confusion, nervousness, sleep disturbance and agitation    Physical Exam: Vitals:   11/22/18 1032  BP: (!) 140/100  Pulse: 78  Temp: 98.4 F (36.9 C)  TempSrc: Oral  SpO2: 96%  Weight: 200 lb 6.4 oz (90.9 kg)  Height: _0  (1.727 m)    Body mass index is 30.47 kg/m.   Constitutional: NAD, calm, comfortable Eyes: PERRL, lids and conjunctivae normal ENMT: Mucous membranes are moist. Posterior pharynx clear of any exudate or lesions. Normal dentition. Tympanic membrane is pearly white, no erythema or bulging. Neck: normal, supple, no masses, no thyromegaly, no carotid bruits Respiratory: clear to auscultation bilaterally, no wheezing, no crackles. Normal respiratory effort. No accessory muscle use.  Cardiovascular: Regular rate and rhythm, no murmurs / rubs / gallops. No extremity edema. 2+ pedal pulses. No carotid bruits.  Abdomen: no tenderness, no masses palpated. No hepatosplenomegaly. Bowel sounds positive.  Musculoskeletal: no clubbing / cyanosis. No joint deformity upper and lower extremities. Good ROM, no contractures. Normal muscle tone.  Skin: no rashes, lesions, ulcers. No induration Neurologic: CN 2-12 grossly intact. Sensation intact, DTR normal. Strength 5/5 in all 4.  Psychiatric: Normal judgment and insight. Alert and oriented x 3. Normal mood.    Impression and Plan:  Encounter for preventive health examination  -Since she is not fasting today, will return next week for labs. -She has routine dental care -She is up-to-date on age-appropriate  cancer screening. -She is up-to-date on immunizations, will receive first shingles today.  Bipolar I disorder, most recent episode depressed ([place]) -Followed closely by psychiatry with recent medication change.  Left carpal tunnel syndrome -It is improved with wrist splints, she knows that if worsens will need referral to Ortho versus sports medicine.     Patient Instructions  -Nice seeing you today!  -Make appointment to return for labs.  -1 of 2 shingles vaccines today.  -Schedule follow up in 6 months.   Preventive Care 40-64 Years, Female Preventive care refers to lifestyle choices and visits with your health care provider that can promote health and wellness. What does preventive care include?   A yearly physical exam. This is also called an annual well check.  Dental exams once or twice a year.  Routine eye exams. Ask your health care provider how often you should have your eyes checked.  Personal lifestyle choices, including: ? Daily care of your teeth and gums. ? Regular physical activity. ? Eating a healthy diet. ? Avoiding tobacco and drug use. ? Limiting alcohol use. ? Practicing safe sex. ? Taking low-dose aspirin daily starting at age 32. ? Taking vitamin and mineral supplements as recommended by your health care provider. What happens during an annual well check? The services and screenings done by your health care provider during your annual well check will depend on  your age, overall health, lifestyle risk factors, and family history of disease. Counseling Your health care provider may ask you questions about your:  Alcohol use.  Tobacco use.  Drug use.  Emotional well-being.  Home and relationship well-being.  Sexual activity.  Eating habits.  Work and work Statistician.  Method of birth control.  Menstrual cycle.  Pregnancy history. Screening You may have the following tests or measurements:  Height, weight, and BMI.  Blood  pressure.  Lipid and cholesterol levels. These may be checked every 5 years, or more frequently if you are over 53 years old.  Skin check.  Lung cancer screening. You may have this screening every year starting at age 23 if you have a 30-pack-year history of smoking and currently smoke or have quit within the past 15 years.  Colorectal cancer screening. All adults should have this screening starting at age 42 and continuing until age 36. Your health care provider may recommend screening at age 36. You will have tests every 1-10 years, depending on your results and the type of screening test. People at increased risk should start screening at an earlier age. Screening tests may include: ? Guaiac-based fecal occult blood testing. ? Fecal immunochemical test (FIT). ? Stool DNA test. ? Virtual colonoscopy. ? Sigmoidoscopy. During this test, a flexible tube with a tiny camera (sigmoidoscope) is used to examine your rectum and lower colon. The sigmoidoscope is inserted through your anus into your rectum and lower colon. ? Colonoscopy. During this test, a long, thin, flexible tube with a tiny camera (colonoscope) is used to examine your entire colon and rectum.  Hepatitis C blood test.  Hepatitis B blood test.  Sexually transmitted disease (STD) testing.  Diabetes screening. This is done by checking your blood sugar (glucose) after you have not eaten for a while (fasting). You may have this done every 1-3 years.  Mammogram. This may be done every 1-2 years. Talk to your health care provider about when you should start having regular mammograms. This may depend on whether you have a family history of breast cancer.  BRCA-related cancer screening. This may be done if you have a family history of breast, ovarian, tubal, or peritoneal cancers.  Pelvic exam and Pap test. This may be done every 3 years starting at age 16. Starting at age 12, this may be done every 5 years if you have a Pap test in  combination with an HPV test.  Bone density scan. This is done to screen for osteoporosis. You may have this scan if you are at high risk for osteoporosis. Discuss your test results, treatment options, and if necessary, the need for more tests with your health care provider. Vaccines Your health care provider may recommend certain vaccines, such as:  Influenza vaccine. This is recommended every year.  Tetanus, diphtheria, and acellular pertussis (Tdap, Td) vaccine. You may need a Td booster every 10 years.  Varicella vaccine. You may need this if you have not been vaccinated.  Zoster vaccine. You may need this after age 58.  Measles, mumps, and rubella (MMR) vaccine. You may need at least one dose of MMR if you were born in 1957 or later. You may also need a second dose.  Pneumococcal 13-valent conjugate (PCV13) vaccine. You may need this if you have certain conditions and were not previously vaccinated.  Pneumococcal polysaccharide (PPSV23) vaccine. You may need one or two doses if you smoke cigarettes or if you have certain conditions.  Meningococcal vaccine. You  may need this if you have certain conditions.  Hepatitis A vaccine. You may need this if you have certain conditions or if you travel or work in places where you may be exposed to hepatitis A.  Hepatitis B vaccine. You may need this if you have certain conditions or if you travel or work in places where you may be exposed to hepatitis B.  Haemophilus influenzae type b (Hib) vaccine. You may need this if you have certain conditions. Talk to your health care provider about which screenings and vaccines you need and how often you need them. This information is not intended to replace advice given to you by your health care provider. Make sure you discuss any questions you have with your health care provider. Document Released: 10/09/2015 Document Revised: 11/02/2017 Document Reviewed: 07/14/2015 Elsevier Interactive Patient  Education  2019 [place], [place] [facility]

## 2018-11-22 NOTE — Patient Instructions (Signed)
-Nice seeing you today!  -Make appointment to return for labs.  -1 of 2 shingles vaccines today.  -Schedule follow up in 6 months.   Preventive Care 40-64 Years, Female Preventive care refers to lifestyle choices and visits with your health care provider that can promote health and wellness. What does preventive care include?   A yearly physical exam. This is also called an annual well check.  Dental exams once or twice a year.  Routine eye exams. Ask your health care provider how often you should have your eyes checked.  Personal lifestyle choices, including: ? Daily care of your teeth and gums. ? Regular physical activity. ? Eating a healthy diet. ? Avoiding tobacco and drug use. ? Limiting alcohol use. ? Practicing safe sex. ? Taking low-dose aspirin daily starting at age 57. ? Taking vitamin and mineral supplements as recommended by your health care provider. What happens during an annual well check? The services and screenings done by your health care provider during your annual well check will depend on your age, overall health, lifestyle risk factors, and family history of disease. Counseling Your health care provider may ask you questions about your:  Alcohol use.  Tobacco use.  Drug use.  Emotional well-being.  Home and relationship well-being.  Sexual activity.  Eating habits.  Work and work Statistician.  Method of birth control.  Menstrual cycle.  Pregnancy history. Screening You may have the following tests or measurements:  Height, weight, and BMI.  Blood pressure.  Lipid and cholesterol levels. These may be checked every 5 years, or more frequently if you are over 64 years old.  Skin check.  Lung cancer screening. You may have this screening every year starting at age 4 if you have a 30-pack-year history of smoking and currently smoke or have quit within the past 15 years.  Colorectal cancer screening. All adults should have this  screening starting at age 74 and continuing until age 5. Your health care provider may recommend screening at age 43. You will have tests every 1-10 years, depending on your results and the type of screening test. People at increased risk should start screening at an earlier age. Screening tests may include: ? Guaiac-based fecal occult blood testing. ? Fecal immunochemical test (FIT). ? Stool DNA test. ? Virtual colonoscopy. ? Sigmoidoscopy. During this test, a flexible tube with a tiny camera (sigmoidoscope) is used to examine your rectum and lower colon. The sigmoidoscope is inserted through your anus into your rectum and lower colon. ? Colonoscopy. During this test, a long, thin, flexible tube with a tiny camera (colonoscope) is used to examine your entire colon and rectum.  Hepatitis C blood test.  Hepatitis B blood test.  Sexually transmitted disease (STD) testing.  Diabetes screening. This is done by checking your blood sugar (glucose) after you have not eaten for a while (fasting). You may have this done every 1-3 years.  Mammogram. This may be done every 1-2 years. Talk to your health care provider about when you should start having regular mammograms. This may depend on whether you have a family history of breast cancer.  BRCA-related cancer screening. This may be done if you have a family history of breast, ovarian, tubal, or peritoneal cancers.  Pelvic exam and Pap test. This may be done every 3 years starting at age 4. Starting at age 17, this may be done every 5 years if you have a Pap test in combination with an HPV test.  Bone density  scan. This is done to screen for osteoporosis. You may have this scan if you are at high risk for osteoporosis. Discuss your test results, treatment options, and if necessary, the need for more tests with your health care provider. Vaccines Your health care provider may recommend certain vaccines, such as:  Influenza vaccine. This is  recommended every year.  Tetanus, diphtheria, and acellular pertussis (Tdap, Td) vaccine. You may need a Td booster every 10 years.  Varicella vaccine. You may need this if you have not been vaccinated.  Zoster vaccine. You may need this after age 24.  Measles, mumps, and rubella (MMR) vaccine. You may need at least one dose of MMR if you were born in 1957 or later. You may also need a second dose.  Pneumococcal 13-valent conjugate (PCV13) vaccine. You may need this if you have certain conditions and were not previously vaccinated.  Pneumococcal polysaccharide (PPSV23) vaccine. You may need one or two doses if you smoke cigarettes or if you have certain conditions.  Meningococcal vaccine. You may need this if you have certain conditions.  Hepatitis A vaccine. You may need this if you have certain conditions or if you travel or work in places where you may be exposed to hepatitis A.  Hepatitis B vaccine. You may need this if you have certain conditions or if you travel or work in places where you may be exposed to hepatitis B.  Haemophilus influenzae type b (Hib) vaccine. You may need this if you have certain conditions. Talk to your health care provider about which screenings and vaccines you need and how often you need them. This information is not intended to replace advice given to you by your health care provider. Make sure you discuss any questions you have with your health care provider. Document Released: 10/09/2015 Document Revised: 11/02/2017 Document Reviewed: 07/14/2015 Elsevier Interactive Patient Education  2019 [employer].

## 2018-11-30 LAB — LIPID PANEL
Cholesterol: 208 mg/dL — ABNORMAL HIGH (ref 0–200)
HDL: 55 mg/dL (ref >39.00)
LDL CALC: 130 mg/dL — AB (ref 0–99)
NonHDL: 153.35
Total CHOL/HDL Ratio: 4
Triglycerides: 116 mg/dL (ref 0.0–149.0)
VLDL: 23.2 mg/dL (ref 0.0–40.0)

## 2018-11-30 LAB — CBC WITH DIFFERENTIAL/PLATELET
Basophils Absolute: 0.1 10*3/uL (ref 0.0–0.1)
Basophils Relative: 1.3 % (ref 0.0–3.0)
Eosinophils Absolute: 0.4 10*3/uL (ref 0.0–0.7)
Eosinophils Relative: 8.2 % — ABNORMAL HIGH (ref 0.0–5.0)
HEMATOCRIT: 38.5 % (ref 36.0–46.0)
HEMOGLOBIN: 12.6 g/dL (ref 12.0–15.0)
LYMPHS PCT: 35.3 % (ref 12.0–46.0)
Lymphs Abs: 1.9 10*3/uL (ref 0.7–4.0)
MCHC: 32.7 g/dL (ref 30.0–36.0)
MCV: 91.6 fl (ref 78.0–100.0)
MONOS PCT: 7.5 % (ref 3.0–12.0)
Monocytes Absolute: 0.4 10*3/uL (ref 0.1–1.0)
Neutro Abs: 2.6 10*3/uL (ref 1.4–7.7)
Neutrophils Relative %: 47.7 % (ref 43.0–77.0)
Platelets: 257 10*3/uL (ref 150.0–400.0)
RBC: 4.2 Mil/uL (ref 3.87–5.11)
RDW: 13.8 % (ref 11.5–15.5)
WBC: 5.4 10*3/uL (ref 4.0–10.5)

## 2018-11-30 LAB — COMPREHENSIVE METABOLIC PANEL
ALT: 19 U/L (ref 0–35)
AST: 20 U/L (ref 0–37)
Albumin: 4.3 g/dL (ref 3.5–5.2)
Alkaline Phosphatase: 70 U/L (ref 39–117)
BUN: 27 mg/dL — ABNORMAL HIGH (ref 6–23)
CALCIUM: 9.8 mg/dL (ref 8.4–10.5)
CO2: 23 mEq/L (ref 19–32)
Chloride: 107 mEq/L (ref 96–112)
Creatinine, Ser: 1.29 mg/dL — ABNORMAL HIGH (ref 0.40–1.20)
GFR: 42.31 mL/min — ABNORMAL LOW (ref >60.00)
Glucose, Bld: 83 mg/dL (ref 70–99)
Potassium: 4.5 mEq/L (ref 3.5–5.1)
Sodium: 137 mEq/L (ref 135–145)
Total Bilirubin: 0.4 mg/dL (ref 0.2–1.2)
Total Protein: 6.5 g/dL (ref 6.0–8.3)

## 2018-11-30 LAB — VITAMIN B12: Vitamin B-12: 1086 pg/mL — ABNORMAL HIGH (ref 211–911)

## 2018-11-30 NOTE — Addendum Note (Signed)
Addended by: Elmer Picker on: 11/30/2018 10:47 AM   Modules accepted: Orders
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
## 2018-12-13 ENCOUNTER — Encounter (HOSPITAL_COMMUNITY): Payer: Self-pay | Admitting: Psychiatry

## 2018-12-13 ENCOUNTER — Other Ambulatory Visit: Payer: Self-pay

## 2018-12-13 ENCOUNTER — Ambulatory Visit (INDEPENDENT_AMBULATORY_CARE_PROVIDER_SITE_OTHER): Payer: 59 | Admitting: Psychiatry

## 2018-12-13 VITALS — BP 136/82 | HR 82 | Ht 68.0 in | Wt 193.0 lb

## 2018-12-13 DIAGNOSIS — Z818 Family history of other mental and behavioral disorders: Secondary | ICD-10-CM

## 2018-12-13 DIAGNOSIS — F319 Bipolar disorder, unspecified: Secondary | ICD-10-CM | POA: Diagnosis not present

## 2018-12-13 MED ORDER — PAROXETINE HCL 20 MG PO TABS: 20.0000 mg | ORAL_TABLET | Freq: Every day | ORAL | 0 refills | Status: DC

## 2018-12-13 MED ORDER — LAMOTRIGINE 25 MG PO TABS: 50.0000 mg | ORAL_TABLET | Freq: Every day | ORAL | 0 refills | Status: DC

## 2018-12-13 MED ORDER — QUETIAPINE FUMARATE 100 MG PO TABS: 150.0000 mg | ORAL_TABLET | Freq: Every day | ORAL | 0 refills | Status: DC

## 2018-12-13 NOTE — Patient Instructions (Signed)
1. Continue Paxil 20 mg at night   2. Continue lamotrigine 50 mg daily  3. Continue quetiapine 150 mg at night 4. Return to clinic inthree months for 15 mins

## 2019-01-10 DIAGNOSIS — T43595S Adverse effect of other antipsychotics and neuroleptics, sequela: Secondary | ICD-10-CM | POA: Diagnosis not present

## 2019-01-10 DIAGNOSIS — N141 Nephropathy induced by other drugs, medicaments and biological substances: Secondary | ICD-10-CM | POA: Diagnosis not present

## 2019-01-10 DIAGNOSIS — G5600 Carpal tunnel syndrome, unspecified upper limb: Secondary | ICD-10-CM | POA: Diagnosis not present

## 2019-01-10 DIAGNOSIS — N183 Chronic kidney disease, stage 3 (moderate): Secondary | ICD-10-CM | POA: Diagnosis not present

## 2019-01-10 DIAGNOSIS — R8281 Pyuria: Secondary | ICD-10-CM | POA: Diagnosis not present

## 2019-01-10 DIAGNOSIS — T56891A Toxic effect of other metals, accidental (unintentional), initial encounter: Secondary | ICD-10-CM | POA: Diagnosis not present

## 2019-01-10 DIAGNOSIS — F319 Bipolar disorder, unspecified: Secondary | ICD-10-CM | POA: Diagnosis not present

## 2019-02-06 ENCOUNTER — Ambulatory Visit: Payer: Medicare Other | Admitting: Internal Medicine

## 2019-02-06 NOTE — Progress Notes (Signed)
Virtual Visit via Video Note  I connected with Miranda Harris on 02/11/19 at 10:00 AM EDT by a video enabled telemedicine application and verified that I am speaking with the correct person using two identifiers.   I discussed the limitations of evaluation and management by telemedicine and the availability of in person appointments. The patient expressed understanding and agreed to proceed.   I discussed the assessment and treatment plan with the patient. The patient was provided an opportunity to ask questions and all were answered. The patient agreed with the plan and demonstrated an understanding of the instructions.   The patient was advised to call back or seek an in-person evaluation if the symptoms worsen or if the condition fails to improve as anticipated.  I provided 15 minutes of non-face-to-face time during this encounter.   Miranda Clay, MD    [facility] MD/PA/NP OP Progress Note  02/11/2019 10:29 AM Miranda Harris  MRN:  [id number]  Chief Complaint:  Chief Complaint    Other; Follow-up     HPI:  This is a follow-up appointment for bipolar disorder.  She states that she made an appointment sooner as she will run out of lamotrigine.  Although she is very sure that she has been taking 2 tablets as instructed, she believes she received 160 tablets to start with (instead of 180 tabs).  She states that she has been doing well.  She believes that she has more energy after switching from lithium to lamotrigine.  She is working on Education administrator the house, working on Biomedical scientist.  Although she feels anxious when she sees the news of COVID-19, she has been trying to limit watching the news.  She enjoys meeting with her neighbor at times.  Although she does have frustration of not being able to work, she feels good otherwise.  She sleeps well.  She denies feeling fatigued or depressed. She has fair appetite. She feels good that she has lost her weight. She has fair concentration.  She  denies SI.  She denies panic attacks.  She denies decreased need for sleep or euphoria. She believes that her recent labs Cre 1.61.    190 lbs Wt Readings from Last 3 Encounters:  12/13/18 193 lb (87.5 kg)  11/22/18 200 lb 6.4 oz (90.9 kg)  11/13/18 200 lb (90.7 kg)    Visit Diagnosis:    ICD-10-CM   1. Bipolar 1 disorder ([place]) F31.9     Past Psychiatric History: Please see initial evaluation for full details. I have reviewed the history. No updates at this time.     Past Medical History:  Past Medical History:  Diagnosis Date  . Bipolar disorder ([place])   . Elevated serum creatinine    related to lithium use  . Helicobacter pylori (H. pylori) infection    pt denies  . Hypothyroidism   . Perirectal fistula   . Thyroid disease   . Trochanteric bursitis of right hip     Past Surgical History:  Procedure Laterality Date  . EVALUATION UNDER ANESTHESIA WITH HEMORRHOIDECTOMY N/A 06/28/2017   Procedure: EXAM UNDER ANESTHESIA WITH EXTERNAL HEMORRHOIDECTOMY AND REPAIR OF PERIRECTAL FISTULA;  Surgeon: Michael Boston, MD;  Location: [place];  Service: General;  Laterality: N/A;  . HERNIA REPAIR  9-48 yrs ago   umbilical hernia  . INCISION AND DRAINAGE PERIRECTAL ABSCESS N/A 04/18/2017   Procedure: IRRIGATION AND DEBRIDEMENT PERIRECTAL ABSCESS, placement of seton, sigmoidoscopy;  Surgeon: Excell Seltzer, MD;  Location: [facility];  Service:  General;  Laterality: N/A;  . LAPAROSCOPIC APPENDECTOMY    . VAGINAL HYSTERECTOMY Bilateral    complete  . WISDOM TOOTH EXTRACTION      Family Psychiatric History: Please see initial evaluation for full details. I have reviewed the history. No updates at this time.     Family History:  Family History  Problem Relation Age of Onset  . Anxiety disorder Brother     Social History:  Social History   Socioeconomic History  . Marital status: Married    Spouse name: Not on file  . Number of children: Not on file  . Years  of education: Not on file  . Highest education level: Not on file  Occupational History  . Not on file  Social Needs  . Financial resource strain: Not on file  . Food insecurity:    Worry: Not on file    Inability: Not on file  . Transportation needs:    Medical: Not on file    Non-medical: Not on file  Tobacco Use  . Smoking status: Former Smoker    Packs/day: 1.00    Years: 30.00    Pack years: 30.00    Types: Cigarettes  . Smokeless tobacco: Never Used  . Tobacco comment: quit 2008  Substance and Sexual Activity  . Alcohol use: No    Alcohol/week: 0.0 standard drinks    Comment: Occasional use  . Drug use: No  . Sexual activity: Yes    Partners: Male    Birth control/protection: Surgical  Lifestyle  . Physical activity:    Days per week: Not on file    Minutes per session: Not on file  . Stress: Not on file  Relationships  . Social connections:    Talks on phone: Not on file    Gets together: Not on file    Attends religious service: Not on file    Active member of club or organization: Not on file    Attends meetings of clubs or organizations: Not on file    Relationship status: Not on file  Other Topics Concern  . Not on file  Social History Narrative  . Not on file    Allergies: No Known Allergies  Metabolic Disorder Labs: Lab Results  Component Value Date   HGBA1C 5.4 06/10/2015   MPG 108 06/10/2015   No results found for: PROLACTIN Lab Results  Component Value Date   CHOL 208 (H) 11/30/2018   TRIG 116.0 11/30/2018   HDL 55.00 11/30/2018   CHOLHDL 4 11/30/2018   VLDL 23.2 11/30/2018   LDLCALC 130 (H) 11/30/2018   Lab Results  Component Value Date   TSH 3.04 11/06/2018    Therapeutic Level Labs: Lab Results  Component Value Date   LITHIUM 1.2 11/08/2018   LITHIUM 0.80 06/10/2015   No results found for: VALPROATE No components found for:  CBMZ  Current Medications: Current Outpatient Medications  Medication Sig Dispense Refill  .  Calcium Carbonate-Vitamin D (CALTRATE 600+D PO) Take 2 tablets by mouth daily.    [name] lamoTRIgine (LAMICTAL) 25 MG tablet Take 2 tablets (50 mg total) by mouth daily. 180 tablet 0  . levothyroxine (SYNTHROID, LEVOTHROID) 150 MCG tablet Take 1 tablet (150 mcg total) by mouth daily. 90 tablet 3  . Multiple Vitamin (MULTIVITAMIN WITH MINERALS) TABS tablet Take 1 tablet by mouth daily.    . Omega-3 Fatty Acids (FISH OIL) 1000 MG CAPS Take 2,000 mg by mouth daily.    [name] PARoxetine (PAXIL) 20  MG tablet Take 1 tablet (20 mg total) by mouth daily. 90 tablet 0  . QUERCETIN PO Take 1 capsule by mouth 2 (two) times daily.    Derrill Memo ON 02/19/2019] QUEtiapine (SEROQUEL) 100 MG tablet Take 1.5 tablets (150 mg total) by mouth at bedtime. 135 tablet 0   No current facility-administered medications for this visit.      Musculoskeletal: Strength & Muscle Tone: N/A Gait & Station: N/A Patient leans: N/A  Psychiatric Specialty Exam: Review of Systems  Psychiatric/Behavioral: Negative for depression, hallucinations, memory loss, substance abuse and suicidal ideas. The patient is nervous/anxious. The patient does not have insomnia.   All other systems reviewed and are negative.   There were no vitals taken for this visit.There is no height or weight on file to calculate BMI.  General Appearance: Fairly Groomed  Eye Contact:  Good  Speech:  Clear and Coherent  Volume:  Normal  Mood:  "good"  Affect:  Appropriate, Congruent and Full Range  Thought Process:  Coherent  Orientation:  Full (Time, Place, and Person)  Thought Content: Logical   Suicidal Thoughts:  No  Homicidal Thoughts:  No  Memory:  Immediate;   Good  Judgement:  Good  Insight:  Fair  Psychomotor Activity:  Normal  Concentration:  Concentration: Good and Attention Span: Good  Recall:  Good  Fund of Knowledge: Good  Language: Good  Akathisia:  No  Handed:  Right  AIMS (if indicated): not done  Assets:  Communication Skills Desire for  Improvement  ADL's:  Intact  Cognition: WNL  Sleep:  Good   Screenings: PHQ2-9     Office Visit from 11/08/2018 in [place] [facility] [place]  PHQ-2 Total Score  0       Assessment and Plan:  Bora Bost is a 58 y.o. year old female with a history of bipolar I disorder, anxiety,alcohol use disorder in sustained remission, hypothyroidism , who presents for follow up appointment for Bipolar 1 disorder ([place])  # Bipolar I disorder She denies significant mood symptoms after switching from lithium to lamotrigine.  Will continue lamotrigine to target bipolar 1 disorder.  Discussed potential side effect of Stevens-Johnson syndrome.  Will continue Paxil for bipolar depression.  Will continue quetiapine to target bipolar 1 disorder.  Discussed potential metabolic side effect.  Discussed behavioral activation.   # increased creatinine She is followed by PCP. Her creatinine is up to 1.6 (from 1.2) according to the patient; she is advised to follow with her PCP.  Plan:  I have reviewed and updated plans as below 1. Continue Paxil 20 mg at night   2. Continue lamotrigine 50 mg daily  3. Continue quetiapine 150 mg at night 4. Next appointment: 8/14 at 9 AM for 20 mins, video - she is advised to follow up regarding elevated creatinine with her primary care (trending down)  Past trials of medication: Sertraline,fluoxetine (drowsiness),Lexapro,Duloxetine,venlafaxine (initially worked well, stopped working, hair loss),Wellbutrin(insomnia),Latuda,Abilify (flu like symptoms), Haldol (EPS), Thorazine (EPS), Stelazine, Lithium, Lunesta (sedation), Ambien (sleepwalking, driving, eating), Trazodone (excessive sedation), Xanax  The patient demonstrates the following risk factors for suicide: Chronic risk factorsfor suicide include psychiatric disorder /bipolar disorder,history of substance use disorder, family history of suicide attempt. Acute risk factorsfor suicide  includenone.Protective factorsfor this patient include positive social support, responsibility to others (family), coping skills, hope for the future. Although patient reports passive SI, she adamantly denies any intent and has had no previous suicide attempt.Considering these factors, the overall suicide risk at  this point appears to be low.   Miranda Clay, MD 02/11/2019, 10:29 AM

## 2019-02-11 ENCOUNTER — Other Ambulatory Visit: Payer: Self-pay

## 2019-02-11 ENCOUNTER — Encounter (HOSPITAL_COMMUNITY): Payer: Self-pay | Admitting: Psychiatry

## 2019-02-11 ENCOUNTER — Ambulatory Visit (INDEPENDENT_AMBULATORY_CARE_PROVIDER_SITE_OTHER): Payer: 59 | Admitting: Psychiatry

## 2019-02-11 DIAGNOSIS — F319 Bipolar disorder, unspecified: Secondary | ICD-10-CM | POA: Diagnosis not present

## 2019-02-11 MED ORDER — QUETIAPINE FUMARATE 100 MG PO TABS: 150.0000 mg | ORAL_TABLET | Freq: Every day | ORAL | 0 refills | Status: DC

## 2019-02-11 MED ORDER — PAROXETINE HCL 20 MG PO TABS: 20.0000 mg | ORAL_TABLET | Freq: Every day | ORAL | 0 refills | Status: DC

## 2019-02-11 MED ORDER — LAMOTRIGINE 25 MG PO TABS: 50.0000 mg | ORAL_TABLET | Freq: Every day | ORAL | 0 refills | Status: DC

## 2019-02-11 NOTE — Patient Instructions (Signed)
1. Continue Paxil 20 mg at night   2. Continue lamotrigine 50 mg daily  3. Continue quetiapine 150 mg at night 4. Next appointment: 8/14 at 9 AM

## 2019-03-18 ENCOUNTER — Ambulatory Visit (HOSPITAL_COMMUNITY): Payer: Medicare Other | Admitting: Psychiatry

## 2019-03-18 DIAGNOSIS — Z1159 Encounter for screening for other viral diseases: Secondary | ICD-10-CM | POA: Diagnosis not present

## 2019-03-26 ENCOUNTER — Other Ambulatory Visit: Payer: Self-pay

## 2019-03-26 ENCOUNTER — Ambulatory Visit (INDEPENDENT_AMBULATORY_CARE_PROVIDER_SITE_OTHER): Payer: 59 | Admitting: Internal Medicine

## 2019-03-26 VITALS — BP 110/70 | HR 92 | Temp 98.0°F | Wt 200.8 lb

## 2019-03-26 DIAGNOSIS — G5602 Carpal tunnel syndrome, left upper limb: Secondary | ICD-10-CM | POA: Diagnosis not present

## 2019-03-26 NOTE — Progress Notes (Signed)
Established Patient Office Visit     CC/Reason for Visit: Discuss continued carpal tunnel pain  HPI: Miranda Harris is a 58 y.o. female who is coming in today for the above mentioned reasons. Past Medical History is significant for: bipolar disorder followed by psychiatry, hypothyroidism on synthroid. I saw her last towards the end of Feb. She had been c/o bilateral wrist pain and numbness of first three fingers of both hands L>R. She was given wrists splints with significant improvement. In May she started gardening and noticed worsening that is not improving with use of splints.   Past Medical/Surgical History: Past Medical History:  Diagnosis Date  . Bipolar disorder ([place])   . Elevated serum creatinine    related to lithium use  . Helicobacter pylori (H. pylori) infection    pt denies  . Hypothyroidism   . Perirectal fistula   . Thyroid disease   . Trochanteric bursitis of right hip     Past Surgical History:  Procedure Laterality Date  . EVALUATION UNDER ANESTHESIA WITH HEMORRHOIDECTOMY N/A 06/28/2017   Procedure: EXAM UNDER ANESTHESIA WITH EXTERNAL HEMORRHOIDECTOMY AND REPAIR OF PERIRECTAL FISTULA;  Surgeon: Michael Boston, MD;  Location: [place];  Service: General;  Laterality: N/A;  . HERNIA REPAIR  8-41 yrs ago   umbilical hernia  . INCISION AND DRAINAGE PERIRECTAL ABSCESS N/A 04/18/2017   Procedure: IRRIGATION AND DEBRIDEMENT PERIRECTAL ABSCESS, placement of seton, sigmoidoscopy;  Surgeon: Excell Seltzer, MD;  [facility];  Service: General;  Laterality: N/A;  . LAPAROSCOPIC APPENDECTOMY    . VAGINAL HYSTERECTOMY Bilateral    complete  . WISDOM TOOTH EXTRACTION      Social History:  reports that she has quit smoking. Her smoking use included cigarettes. She has a 30.00 pack-year smoking history. She has never used smokeless tobacco. She reports that she does not drink alcohol or use drugs.  Allergies: No Known Allergies  Family  History:  Family History  Problem Relation Age of Onset  . Anxiety disorder Brother      Current Outpatient Medications:  .  Calcium Carbonate-Vitamin D (CALTRATE 600+D PO), Take 2 tablets by mouth daily., Disp: , Rfl:  .  lamoTRIgine (LAMICTAL) 25 MG tablet, Take 2 tablets (50 mg total) by mouth daily., Disp: 180 tablet, Rfl: 0 .  lamoTRIgine (LAMICTAL) 25 MG tablet, , Disp: , Rfl:  .  levothyroxine (SYNTHROID, LEVOTHROID) 150 MCG tablet, Take 1 tablet (150 mcg total) by mouth daily., Disp: 90 tablet, Rfl: 3 .  Multiple Vitamin (MULTIVITAMIN WITH MINERALS) TABS tablet, Take 1 tablet by mouth daily., Disp: , Rfl:  .  Omega-3 Fatty Acids (FISH OIL) 1000 MG CAPS, Take 2,000 mg by mouth daily., Disp: , Rfl:  .  PARoxetine (PAXIL) 20 MG tablet, Take 1 tablet (20 mg total) by mouth daily., Disp: 90 tablet, Rfl: 0 .  QUERCETIN PO, Take 1 capsule by mouth 2 (two) times daily., Disp: , Rfl:  .  QUEtiapine (SEROQUEL) 100 MG tablet, Take 1.5 tablets (150 mg total) by mouth at bedtime., Disp: 135 tablet, Rfl: 0  Review of Systems:  Constitutional: Denies fever, chills, diaphoresis, appetite change and fatigue.  HEENT: Denies photophobia, eye pain, redness, hearing loss, ear pain, congestion, sore throat, rhinorrhea, sneezing, mouth sores, trouble swallowing, neck pain, neck stiffness and tinnitus.   Respiratory: Denies SOB, DOE, cough, chest tightness,  and wheezing.   Cardiovascular: Denies chest pain, palpitations and leg swelling.  Gastrointestinal: Denies nausea, vomiting, abdominal pain,  diarrhea, constipation, blood in stool and abdominal distention.  Genitourinary: Denies dysuria, urgency, frequency, hematuria, flank pain and difficulty urinating.  Endocrine: Denies: hot or cold intolerance, sweats, changes in hair or nails, polyuria, polydipsia. Musculoskeletal: Denies myalgias, back pain, joint swelling, arthralgias and gait problem.  Skin: Denies pallor, rash and wound.  Neurological:  Denies dizziness, seizures, syncope, weakness, light-headedness, numbness and headaches.  Hematological: Denies adenopathy. Easy bruising, personal or family bleeding history  Psychiatric/Behavioral: Denies suicidal ideation, mood changes, confusion, nervousness, sleep disturbance and agitation    Physical Exam: Vitals:   03/26/19 1348  BP: 110/70  Pulse: 92  Temp: 98 F (36.7 C)  TempSrc: Oral  SpO2: 97%  Weight: 200 lb 12.8 oz (91.1 kg)    Body mass index is 30.53 kg/m.   Constitutional: NAD, calm, comfortable Eyes: PERRL, lids and conjunctivae normal ENMT: Mucous membranes are moist. Musculoskeletal: no clubbing / cyanosis. No joint deformity upper and lower extremities. Good ROM, no contractures. Normal muscle tone. Pain to palpation of plantar aspect of wrist with shooting numbness into thumb of the left Skin: no rashes, lesions, ulcers. No induration Neurologic: grossly intact and nonfocal Psychiatric: Normal judgment and insight. Alert and oriented x 3. Normal mood.    Impression and Plan:  Bilateral carpal tunnel syndrome -Progressing despite use of wrist splints. -Sports med referral today for consideration of cortisone injections; if no improvement may need ortho referral for carpal tunnel release surgery.      Lelon Frohlich, MD [facility]

## 2019-04-05 ENCOUNTER — Other Ambulatory Visit: Payer: Self-pay | Admitting: Internal Medicine

## 2019-04-05 DIAGNOSIS — Z1231 Encounter for screening mammogram for malignant neoplasm of breast: Secondary | ICD-10-CM

## 2019-04-18 ENCOUNTER — Encounter: Payer: Self-pay | Admitting: Family Medicine

## 2019-04-18 ENCOUNTER — Ambulatory Visit (INDEPENDENT_AMBULATORY_CARE_PROVIDER_SITE_OTHER): Payer: 59 | Admitting: Family Medicine

## 2019-04-18 ENCOUNTER — Ambulatory Visit: Payer: Self-pay

## 2019-04-18 ENCOUNTER — Other Ambulatory Visit: Payer: Self-pay

## 2019-04-18 VITALS — BP 116/78 | HR 80 | Ht 68.0 in | Wt 198.6 lb

## 2019-04-18 DIAGNOSIS — M25532 Pain in left wrist: Secondary | ICD-10-CM

## 2019-04-18 DIAGNOSIS — M25531 Pain in right wrist: Secondary | ICD-10-CM

## 2019-04-18 DIAGNOSIS — G5602 Carpal tunnel syndrome, left upper limb: Secondary | ICD-10-CM | POA: Diagnosis not present

## 2019-04-18 MED ORDER — GABAPENTIN 100 MG PO CAPS: 200.0000 mg | ORAL_CAPSULE | Freq: Every day | ORAL | 3 refills | Status: DC

## 2019-04-18 NOTE — Assessment & Plan Note (Signed)
Patient does have carpal tunnel and that seems to be bilateral but left greater than right.  Discussed with patient in great length.  Discussed icing regimen, home exercises, topical anti-inflammatories, bracing.  Patient has been making some progress already with the bracing so I do think patient should do well with conservative therapy.  We discussed different medications and given a low dose of gabapentin at night to that should be fine with her other medications.  We discussed icing regimen.  Patient continues to have pain will consider injections.  Patient will follow-up with me again in 4 weeks

## 2019-04-18 NOTE — Patient Instructions (Signed)
Good to see you Wear the brace at night Exercises 3 times a week.  Gabapentin 200 mg at night  Vitamin D 4,000-5,000 IU daily If not any better we will get labs voltaren gel over the counter See me again in 5-6 weeks

## 2019-04-18 NOTE — Progress Notes (Signed)
[name] [address] [place], [place] [address] Phone: [phone] Subjective:    I'm seeing this patient by the request  of:   Isaac Bliss, Rayford Halsted, MD  I, Wendy Poet, LAT, ATC, am serving as scribe for Dr. [name].  CC: bilateral wrist pain   ERX:[id number]    04/18/19: Miranda Harris is a 58 y.o. female coming in with complaint of B wrist/hand pain.  Pt states that she has been having pain x 5 months.  She is wearing B wrist braces.  The L one began bothering her first and then 3 months later the R wrist and hand started to bother her after doing a lot of yard work.  Pt reports numbness in L hand but not her fingers.  She has no numbness in the R hand.  She has pain in B wrists that she rates as a 5-8/10.  She describes the pain as a sharp pain.  She does have radiating pain into her forearms bilaterally.  Onset- 5 months ago (L wrist); 3 months ago (R wrist) Location- B wrists and hands Duration-  Character- Sharp pain in B wrists Aggravating factors-  Wrist flexion; repetitive activity Reliving factors- Wrist braces; 1000mg  Tylenol/day  Therapies tried-  Severity-Ranges from a 5-8/10     Past Medical History:  Diagnosis Date  . Bipolar disorder ([place])   . Elevated serum creatinine    related to lithium use  . Helicobacter pylori (H. pylori) infection    pt denies  . Hypothyroidism   . Perirectal fistula   . Thyroid disease   . Trochanteric bursitis of right hip    Past Surgical History:  Procedure Laterality Date  . EVALUATION UNDER ANESTHESIA WITH HEMORRHOIDECTOMY N/A 06/28/2017   Procedure: EXAM UNDER ANESTHESIA WITH EXTERNAL HEMORRHOIDECTOMY AND REPAIR OF PERIRECTAL FISTULA;  Surgeon: Michael Boston, MD;  Location: [facility];  Service: General;  Laterality: N/A;  . HERNIA REPAIR  5-09 yrs ago   umbilical hernia  . INCISION AND DRAINAGE PERIRECTAL ABSCESS N/A 04/18/2017   Procedure: IRRIGATION AND  DEBRIDEMENT PERIRECTAL ABSCESS, placement of seton, sigmoidoscopy;  Surgeon: Excell Seltzer, MD;  [facility];  Service: General;  Laterality: N/A;  . LAPAROSCOPIC APPENDECTOMY    . VAGINAL HYSTERECTOMY Bilateral    complete  . WISDOM TOOTH EXTRACTION     Social History   Socioeconomic History  . Marital status: Married    Spouse name: Not on file  . Number of children: Not on file  . Years of education: Not on file  . Highest education level: Not on file  Occupational History  . Not on file  Social Needs  . Financial resource strain: Not on file  . Food insecurity    Worry: Not on file    Inability: Not on file  . Transportation needs    Medical: Not on file    Non-medical: Not on file  Tobacco Use  . Smoking status: Former Smoker    Packs/day: 1.00    Years: 30.00    Pack years: 30.00    Types: Cigarettes  . Smokeless tobacco: Never Used  . Tobacco comment: quit 2008  Substance and Sexual Activity  . Alcohol use: No    Alcohol/week: 0.0 standard drinks    Comment: Occasional use  . Drug use: No  . Sexual activity: Yes    Partners: Male    Birth control/protection: Surgical  Lifestyle  . Physical activity  Days per week: Not on file    Minutes per session: Not on file  . Stress: Not on file  Relationships  . Social Herbalist on phone: Not on file    Gets together: Not on file    Attends religious service: Not on file    Active member of club or organization: Not on file    Attends meetings of clubs or organizations: Not on file    Relationship status: Not on file  Other Topics Concern  . Not on file  Social History Narrative  . Not on file   No Known Allergies Family History  Problem Relation Age of Onset  . Anxiety disorder Brother     Current Outpatient Medications (Endocrine & Metabolic):  .  levothyroxine (SYNTHROID, LEVOTHROID) 150 MCG tablet, Take 1 tablet (150 mcg total) by mouth daily.      Current Outpatient  Medications (Other):  [name]  Calcium Carbonate-Vitamin D (CALTRATE 600+D PO), Take 2 tablets by mouth daily. [name]  lamoTRIgine (LAMICTAL) 25 MG tablet, Take 2 tablets (50 mg total) by mouth daily. [name]  lamoTRIgine (LAMICTAL) 25 MG tablet,  .  Multiple Vitamin (MULTIVITAMIN WITH MINERALS) TABS tablet, Take 1 tablet by mouth daily. .  Omega-3 Fatty Acids (FISH OIL) 1000 MG CAPS, Take 2,000 mg by mouth daily. [name]  PARoxetine (PAXIL) 20 MG tablet, Take 1 tablet (20 mg total) by mouth daily. [name]  QUERCETIN PO, Take 1 capsule by mouth 2 (two) times daily. .  QUEtiapine (SEROQUEL) 100 MG tablet, Take 1.5 tablets (150 mg total) by mouth at bedtime. .  gabapentin (NEURONTIN) 100 MG capsule, Take 2 capsules (200 mg total) by mouth at bedtime.    Past medical history, social, surgical and family history all reviewed in electronic medical record.  No pertanent information unless stated regarding to the chief complaint.   Review of Systems:  No headache, visual changes, nausea, vomiting, diarrhea, constipation, dizziness, abdominal pain, skin rash, fevers, chills, night sweats, weight loss, swollen lymph nodes, body aches, joint swelling, muscle aches, chest pain, shortness of breath, mood changes.   Objective  Blood pressure 116/78, pulse 80, height 5\' 8"  (1.727 m), weight 198 lb 9.6 oz (90.1 kg), SpO2 96 %.    General: No apparent distress alert and oriented x3 mood and affect normal, dressed appropriately.  HEENT: Pupils equal, extraocular movements intact  Respiratory: Patient's speak in full sentences and does not appear short of breath  Cardiovascular: No lower extremity edema, non tender, no erythema  Skin: Warm dry intact with no signs of infection or rash on extremities or on axial skeleton.  Abdomen: Soft nontender  Neuro: Cranial nerves II through XII are intact, neurovascularly intact in all extremities with 2+ DTRs and 2+ pulses.  Lymph: No lymphadenopathy of posterior or anterior cervical chain or  axillae bilaterally.  Gait normal with good balance and coordination.  MSK:  Non tender with full range of motion and good stability and symmetric strength and tone of shoulders, elbows,  hip, knee and ankles bilaterally.  Wrist: Bilateral Inspection normal with no visible erythema or swelling. ROM smooth and normal with good flexion and extension and ulnar/radial deviation that is symmetrical with opposite wrist. Palpation is normal over metacarpals, navicular, lunate, and TFCC; tendons without tenderness/ swelling No snuffbox tenderness. No tenderness over Canal of [place]. Strength 5/5 in all directions without pain. Negative Finkelstein, mild positive Tinel's and phalens. Negative Watson's test.  Limited musculoskeletal ultrasound was performed and interpreted  by Lyndal Pulley  Limited ultrasound shows the patient does have some mild enlargement of the diameter of the median nerve left greater than right.  Measures 0.13 cm in area on the left.  Otherwise fairly unremarkable. Impression: Mild carpal tunnel bilaterally left greater than right   Impression and Recommendations:     This case required medical decision making of moderate complexity. The above documentation has been reviewed and is accurate and complete Lyndal Pulley, DO       Note: This dictation was prepared with Dragon dictation along with smaller phrase technology. Any transcriptional errors that result from this process are unintentional.

## 2019-05-08 NOTE — Progress Notes (Signed)
Virtual Visit via Video Note  I connected with Miranda Harris on 05/10/19 at  9:00 AM EDT by a video enabled telemedicine application and verified that I am speaking with the correct person using two identifiers.   I discussed the limitations of evaluation and management by telemedicine and the availability of in person appointments. The patient expressed understanding and agreed to proceed.      I discussed the assessment and treatment plan with the patient. The patient was provided an opportunity to ask questions and all were answered. The patient agreed with the plan and demonstrated an understanding of the instructions.   The patient was advised to call back or seek an in-person evaluation if the symptoms worsen or if the condition fails to improve as anticipated.  I provided 15 minutes of non-face-to-face time during this encounter.   [name], MD     [facility] MD/PA/NP OP Progress Note  05/10/2019 9:15 AM Miranda Harris  MRN:  [id number]  Chief Complaint:  Chief Complaint    Follow-up; Other     HPI:  This is a follow-up appointment for bipolar disorder.  She states that she has been doing well.  She describes that it has been the best since high school here. She believes lamotrigine has been helpful so that she does not believe in the past. She has been trying to keep herself busy.    She has been working on projects in the yard.  She has upcoming interviews for jobs.  She is hoping to get the one so that she can do something during the day.  Although her husband has been busy Landscape architect), she reports good relationship with him. They had 32 years of anniversary.  She communicates with her friends on the phone.  She sleeps 8 hours.  She feels good energy and motivation.  She has better concentration.  She feels well.  She denies SI.  She denies anxiety or panic attacks.  She denies decreased need for sleep or euphoria. She denies any concern about her medication.    Visit Diagnosis:    ICD-10-CM   1. Bipolar 1 disorder ([place])  F31.9     Past Psychiatric History: Please see initial evaluation for full details. I have reviewed the history. No updates at this time.     Past Medical History:  Past Medical History:  Diagnosis Date  . Bipolar disorder ([place])   . Elevated serum creatinine    related to lithium use  . Helicobacter pylori (H. pylori) infection    pt denies  . Hypothyroidism   . Perirectal fistula   . Thyroid disease   . Trochanteric bursitis of right hip     Past Surgical History:  Procedure Laterality Date  . EVALUATION UNDER ANESTHESIA WITH HEMORRHOIDECTOMY N/A 06/28/2017   Procedure: EXAM UNDER ANESTHESIA WITH EXTERNAL HEMORRHOIDECTOMY AND REPAIR OF PERIRECTAL FISTULA;  Surgeon: Michael Boston, MD;  Location: [facility];  Service: General;  Laterality: N/A;  . HERNIA REPAIR  8-29 yrs ago   umbilical hernia  . INCISION AND DRAINAGE PERIRECTAL ABSCESS N/A 04/18/2017   Procedure: IRRIGATION AND DEBRIDEMENT PERIRECTAL ABSCESS, placement of seton, sigmoidoscopy;  Surgeon: Excell Seltzer, MD;  Location: [facility];  Service: General;  Laterality: N/A;  . LAPAROSCOPIC APPENDECTOMY    . VAGINAL HYSTERECTOMY Bilateral    complete  . WISDOM TOOTH EXTRACTION      Family Psychiatric History: Please see initial evaluation for full details. I have reviewed the history. No  updates at this time.     Family History:  Family History  Problem Relation Age of Onset  . Anxiety disorder Brother     Social History:  Social History   Socioeconomic History  . Marital status: Married    Spouse name: Not on file  . Number of children: Not on file  . Years of education: Not on file  . Highest education level: Not on file  Occupational History  . Not on file  Social Needs  . Financial resource strain: Not on file  . Food insecurity    Worry: Not on file    Inability: Not on file  . Transportation needs    Medical: Not  on file    Non-medical: Not on file  Tobacco Use  . Smoking status: Former Smoker    Packs/day: 1.00    Years: 30.00    Pack years: 30.00    Types: Cigarettes  . Smokeless tobacco: Never Used  . Tobacco comment: quit 2008  Substance and Sexual Activity  . Alcohol use: No    Alcohol/week: 0.0 standard drinks    Comment: Occasional use  . Drug use: No  . Sexual activity: Yes    Partners: Male    Birth control/protection: Surgical  Lifestyle  . Physical activity    Days per week: Not on file    Minutes per session: Not on file  . Stress: Not on file  Relationships  . Social Herbalist on phone: Not on file    Gets together: Not on file    Attends religious service: Not on file    Active member of club or organization: Not on file    Attends meetings of clubs or organizations: Not on file    Relationship status: Not on file  Other Topics Concern  . Not on file  Social History Narrative  . Not on file    Allergies: No Known Allergies  Metabolic Disorder Labs: Lab Results  Component Value Date   HGBA1C 5.4 06/10/2015   MPG 108 06/10/2015   No results found for: PROLACTIN Lab Results  Component Value Date   CHOL 208 (H) 11/30/2018   TRIG 116.0 11/30/2018   HDL 55.00 11/30/2018   CHOLHDL 4 11/30/2018   VLDL 23.2 11/30/2018   LDLCALC 130 (H) 11/30/2018   Lab Results  Component Value Date   TSH 3.04 11/06/2018    Therapeutic Level Labs: Lab Results  Component Value Date   LITHIUM 1.2 11/08/2018   LITHIUM 0.80 06/10/2015   No results found for: VALPROATE No components found for:  CBMZ  Current Medications: Current Outpatient Medications  Medication Sig Dispense Refill  . Calcium Carbonate-Vitamin D (CALTRATE 600+D PO) Take 2 tablets by mouth daily.    [name] gabapentin (NEURONTIN) 100 MG capsule Take 2 capsules (200 mg total) by mouth at bedtime. 180 capsule 3  . lamoTRIgine (LAMICTAL) 25 MG tablet Take 2 tablets (50 mg total) by mouth daily. 180  tablet 0  . lamoTRIgine (LAMICTAL) 25 MG tablet     . levothyroxine (SYNTHROID, LEVOTHROID) 150 MCG tablet Take 1 tablet (150 mcg total) by mouth daily. 90 tablet 3  . Multiple Vitamin (MULTIVITAMIN WITH MINERALS) TABS tablet Take 1 tablet by mouth daily.    . Omega-3 Fatty Acids (FISH OIL) 1000 MG CAPS Take 2,000 mg by mouth daily.    [name] PARoxetine (PAXIL) 20 MG tablet Take 1 tablet (20 mg total) by mouth daily. 90 tablet 0  .  QUERCETIN PO Take 1 capsule by mouth 2 (two) times daily.    . QUEtiapine (SEROQUEL) 100 MG tablet Take 1.5 tablets (150 mg total) by mouth at bedtime. 135 tablet 0   No current facility-administered medications for this visit.      Musculoskeletal: Strength & Muscle Tone: N/A Gait & Station: N/A Patient leans: N/A  Psychiatric Specialty Exam: Review of Systems  Psychiatric/Behavioral: Negative for depression, hallucinations, memory loss, substance abuse and suicidal ideas. The patient is not nervous/anxious and does not have insomnia.   All other systems reviewed and are negative.   There were no vitals taken for this visit.There is no height or weight on file to calculate BMI.  General Appearance: Fairly Groomed  Eye Contact:  Good  Speech:  Clear and Coherent  Volume:  Normal  Mood:  "good"  Affect:  Appropriate, Congruent and Full Range  Thought Process:  Coherent  Orientation:  Full (Time, Place, and Person)  Thought Content: Logical   Suicidal Thoughts:  No  Homicidal Thoughts:  No  Memory:  Immediate;   Good  Judgement:  Good  Insight:  Good  Psychomotor Activity:  Normal  Concentration:  Concentration: Good and Attention Span: Good  Recall:  Good  Fund of Knowledge: Good  Language: Good  Akathisia:  No  Handed:  Right  AIMS (if indicated): not done  Assets:  Communication Skills Desire for Improvement  ADL's:  Intact  Cognition: WNL  Sleep:  Good   Screenings: PHQ2-9     Office Visit from 11/08/2018 in [facility] at  [facility]  PHQ-2 Total Score  0       Assessment and Plan:  Basya Casavant is a 58 y.o. 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   # Bipolar I disorder  Exam is notable for euthymic, reactive affect and she denies any significant mood symptoms, and reports improvement in cognition after switching from lithium to lamotrigine.  Will continue lamotrigine to target bipolar 1 disorder.  Discussed potential side effect of Stevens-Johnson syndrome.  We will continue Paxil for bipolar depression.  Will continue quetiapine to target bipolar 1 disorder.  Discussed potential metabolic side effect.  Discussed behavioral activation.   Plan:  I have reviewed and updated plans as below 1. Continue Paxil 20 mgat night  2. Continue lamotrigine 50 mg daily  3.Continue quetiapine 150 mg at night 4. Next appointment: 11/2 at 3 PM for 20 mins, video - Last Cre 1.16 12/2018, followed by nephrologist (records to be scanned)  Past trials of medication: Sertraline,fluoxetine (drowsiness),Lexapro,Duloxetine,venlafaxine (initially worked well, stopped working, hair loss),Wellbutrin(insomnia),Latuda,Abilify (flu like symptoms), Haldol (EPS), Thorazine (EPS), Stelazine, Lithium, Lunesta (sedation), Ambien (sleepwalking, driving, eating), Trazodone (excessive sedation), Xanax  The patient demonstrates the following risk factors for suicide: Chronic risk factorsfor suicide include psychiatric disorder /bipolar disorder,history of substance use disorder, family history of suicide attempt. Acute risk factorsfor suicide includenone.Protective factorsfor this patient include positive social support, responsibility to others (family), coping skills, hope for the future. Although patient reports passive SI, she  adamantly denies any intent and has had no previous suicide attempt.Considering these factors, the overall suicide risk at this point appears to be low.   [name], MD 05/10/2019, 9:15 AM

## 2019-05-10 ENCOUNTER — Other Ambulatory Visit: Payer: Self-pay

## 2019-05-10 ENCOUNTER — Encounter (HOSPITAL_COMMUNITY): Payer: Self-pay | Admitting: Psychiatry

## 2019-05-10 ENCOUNTER — Ambulatory Visit (INDEPENDENT_AMBULATORY_CARE_PROVIDER_SITE_OTHER): Payer: 59 | Admitting: Psychiatry

## 2019-05-10 DIAGNOSIS — F319 Bipolar disorder, unspecified: Secondary | ICD-10-CM | POA: Diagnosis not present

## 2019-05-10 MED ORDER — QUETIAPINE FUMARATE 100 MG PO TABS: 150.0000 mg | ORAL_TABLET | Freq: Every day | ORAL | 0 refills | Status: DC

## 2019-05-10 MED ORDER — LAMOTRIGINE 25 MG PO TABS: 50.0000 mg | ORAL_TABLET | Freq: Every day | ORAL | 0 refills | Status: DC

## 2019-05-10 MED ORDER — PAROXETINE HCL 20 MG PO TABS: 20.0000 mg | ORAL_TABLET | Freq: Every day | ORAL | 0 refills | Status: DC

## 2019-05-10 NOTE — Patient Instructions (Signed)
1. Continue Paxil 20 mgat night  2. Continue lamotrigine 50 mg daily  3.Continue quetiapine 150 mg at night 4. Next appointment: 11/2 at 3 PM

## 2019-05-20 ENCOUNTER — Other Ambulatory Visit: Payer: Self-pay

## 2019-05-20 ENCOUNTER — Ambulatory Visit
Admission: RE | Admit: 2019-05-20 | Discharge: 2019-05-20 | Disposition: A | Discharge status: Home / Self Care | Payer: Medicare Other | Source: Ambulatory Visit | Attending: Internal Medicine | Admitting: Internal Medicine

## 2019-05-20 DIAGNOSIS — Z1231 Encounter for screening mammogram for malignant neoplasm of breast: Secondary | ICD-10-CM

## 2019-05-22 ENCOUNTER — Encounter: Payer: Self-pay | Admitting: Internal Medicine

## 2019-05-22 ENCOUNTER — Other Ambulatory Visit: Payer: Self-pay

## 2019-05-22 ENCOUNTER — Ambulatory Visit (INDEPENDENT_AMBULATORY_CARE_PROVIDER_SITE_OTHER): Payer: 59 | Admitting: Internal Medicine

## 2019-05-22 VITALS — BP 124/84 | HR 80 | Temp 98.5°F | Wt 193.1 lb

## 2019-05-22 DIAGNOSIS — F3130 Bipolar disorder, current episode depressed, mild or moderate severity, unspecified: Secondary | ICD-10-CM

## 2019-05-22 DIAGNOSIS — Z23 Encounter for immunization: Secondary | ICD-10-CM | POA: Diagnosis not present

## 2019-05-22 DIAGNOSIS — G5602 Carpal tunnel syndrome, left upper limb: Secondary | ICD-10-CM

## 2019-05-22 DIAGNOSIS — E039 Hypothyroidism, unspecified: Secondary | ICD-10-CM

## 2019-05-22 DIAGNOSIS — N183 Chronic kidney disease, stage 3 unspecified: Secondary | ICD-10-CM

## 2019-05-22 LAB — BASIC METABOLIC PANEL
BUN: 27 mg/dL — ABNORMAL HIGH (ref 6–23)
CO2: 24 mEq/L (ref 19–32)
Calcium: 9.9 mg/dL (ref 8.4–10.5)
Chloride: 106 mEq/L (ref 96–112)
Creatinine, Ser: 1.19 mg/dL (ref 0.40–1.20)
GFR: 46.36 mL/min — ABNORMAL LOW (ref >60.00)
Glucose, Bld: 81 mg/dL (ref 70–99)
Potassium: 4.4 mEq/L (ref 3.5–5.1)
Sodium: 138 mEq/L (ref 135–145)

## 2019-05-22 LAB — TSH: TSH: 1.48 u[IU]/mL (ref 0.35–4.50)

## 2019-05-22 NOTE — Addendum Note (Signed)
Addended by: Diona Foley on: 05/22/2019 11:12 AM   Modules accepted: Orders

## 2019-05-22 NOTE — Patient Instructions (Addendum)
-  Nice seeing you today!!  -Lab work today; will notify you once results are available.  -Schedule follow up in 6 months or sooner as needed.  Follow-up chronic conditions

## 2019-05-22 NOTE — Progress Notes (Signed)
Established Patient Office Visit     CC/Reason for Visit: Follow-up chronic conditions  HPI: Miranda Harris is a 58 y.o. female who is coming in today for the above mentioned reasons. Past Medical History is significant for: Bipolar 1 disorder that has been well controlled and followed by psychiatry on a regular basis, she also has left greater than right carpal tunnel syndrome, recently saw Dr. Tamala Julian who put her on gabapentin which she took for 2 weeks and has since discontinued she is no longer wearing her brace States she feels better.  Has follow-up with Dr. Tamala Julian next week.  She has a history of hypothyroidism, TSH was normal in February she is 150 mcg of Synthroid, she would like to recheck her TSH today.  She also had renal failure while on lithium and is interested in checking her renal function today.  She feels that she is doing quite well and has no significant acute complaints today.   Past Medical/Surgical History: Past Medical History:  Diagnosis Date  . Bipolar disorder ([place])   . Elevated serum creatinine    related to lithium use  . Helicobacter pylori (H. pylori) infection    pt denies  . Hypothyroidism   . Perirectal fistula   . Thyroid disease   . Trochanteric bursitis of right hip     Past Surgical History:  Procedure Laterality Date  . EVALUATION UNDER ANESTHESIA WITH HEMORRHOIDECTOMY N/A 06/28/2017   Procedure: EXAM UNDER ANESTHESIA WITH EXTERNAL HEMORRHOIDECTOMY AND REPAIR OF PERIRECTAL FISTULA;  Surgeon: Michael Boston, MD;  Location: [facility];  Service: General;  Laterality: N/A;  . HERNIA REPAIR  [id number] yrs ago   umbilical hernia  . INCISION AND DRAINAGE PERIRECTAL ABSCESS N/A 04/18/2017   Procedure: IRRIGATION AND DEBRIDEMENT PERIRECTAL ABSCESS, placement of seton, sigmoidoscopy;  Surgeon: Excell Seltzer, MD;  [facility];  Service: General;  Laterality: N/A;  . LAPAROSCOPIC APPENDECTOMY    . VAGINAL HYSTERECTOMY  Bilateral    complete  . WISDOM TOOTH EXTRACTION      Social History:  reports that she has quit smoking. Her smoking use included cigarettes. She has a 30.00 pack-year smoking history. She has never used smokeless tobacco. She reports that she does not drink alcohol or use drugs.  Allergies: No Known Allergies  Family History:  Family History  Problem Relation Age of Onset  . Anxiety disorder Brother   . Breast cancer Neg Hx      Current Outpatient Medications:  .  Calcium Carbonate-Vitamin D (CALTRATE 600+D PO), Take 2 tablets by mouth daily., Disp: , Rfl:  .  [START ON 05/28/2019] lamoTRIgine (LAMICTAL) 25 MG tablet, Take 2 tablets (50 mg total) by mouth daily., Disp: 180 tablet, Rfl: 0 .  levothyroxine (SYNTHROID, LEVOTHROID) 150 MCG tablet, Take 1 tablet (150 mcg total) by mouth daily., Disp: 90 tablet, Rfl: 3 .  Multiple Vitamin (MULTIVITAMIN WITH MINERALS) TABS tablet, Take 1 tablet by mouth daily., Disp: , Rfl:  .  Omega-3 Fatty Acids (FISH OIL) 1000 MG CAPS, Take 2,000 mg by mouth daily., Disp: , Rfl:  .  [START ON 06/06/2019] PARoxetine (PAXIL) 20 MG tablet, Take 1 tablet (20 mg total) by mouth daily., Disp: 90 tablet, Rfl: 0 .  [START ON 06/13/2019] QUEtiapine (SEROQUEL) 100 MG tablet, Take 1.5 tablets (150 mg total) by mouth at bedtime., Disp: 135 tablet, Rfl: 0 .  gabapentin (NEURONTIN) 100 MG capsule, Take 2 capsules (200 mg total) by mouth at bedtime. (Patient  not taking: Reported on 05/22/2019), Disp: 180 capsule, Rfl: 3  Review of Systems:  Constitutional: Denies fever, chills, diaphoresis, appetite change and fatigue.  HEENT: Denies photophobia, eye pain, redness, hearing loss, ear pain, congestion, sore throat, rhinorrhea, sneezing, mouth sores, trouble swallowing, neck pain, neck stiffness and tinnitus.   Respiratory: Denies SOB, DOE, cough, chest tightness,  and wheezing.   Cardiovascular: Denies chest pain, palpitations and leg swelling.  Gastrointestinal: Denies  nausea, vomiting, abdominal pain, diarrhea, constipation, blood in stool and abdominal distention.  Genitourinary: Denies dysuria, urgency, frequency, hematuria, flank pain and difficulty urinating.  Endocrine: Denies: hot or cold intolerance, sweats, changes in hair or nails, polyuria, polydipsia. Musculoskeletal: Denies myalgias, back pain, joint swelling, arthralgias and gait problem.  Skin: Denies pallor, rash and wound.  Neurological: Denies dizziness, seizures, syncope, weakness, light-headedness, numbness and headaches.  Hematological: Denies adenopathy. Easy bruising, personal or family bleeding history  Psychiatric/Behavioral: Denies suicidal ideation, mood changes, confusion, nervousness, sleep disturbance and agitation    Physical Exam: Vitals:   05/22/19 1024  BP: 124/84  Pulse: 80  Temp: 98.5 F (36.9 C)  TempSrc: Temporal  SpO2: 97%  Weight: 193 lb 1.6 oz (87.6 kg)    Body mass index is 29.36 kg/m.   Constitutional: NAD, calm, comfortable Eyes: PERRL, lids and conjunctivae normal ENMT: Mucous membranes are moist.  Respiratory: clear to auscultation bilaterally, no wheezing, no crackles. Normal respiratory effort. No accessory muscle use.  Cardiovascular: Regular rate and rhythm, no murmurs / rubs / gallops. No extremity edema. 2+ pedal pulses.  Abdomen: no tenderness, no masses palpated. No hepatosplenomegaly. Bowel sounds positive.  Musculoskeletal: no clubbing / cyanosis. No joint deformity upper and lower extremities. Good ROM, no contractures. Normal muscle tone.  Skin: no rashes, lesions, ulcers. No induration Neurologic: Grossly intact and nonfocal Psychiatric: Normal judgment and insight. Alert and oriented x 3. Normal mood.    Impression and Plan:  Left carpal tunnel syndrome -States she feels improved, is no longer wearing braces, has follow-up with sports medicine next week.  CKD (chronic kidney disease) stage 3, GFR 30-59 ml/min (HCC) -Check renal  function today per patient request.  Bipolar I disorder, most recent episode depressed ([place]) -Mood has been stable, she follows up with psychiatry on a regular basis.  Adult hypothyroidism  -Check TSH today, continue Synthroid 150 mcg.    Patient Instructions  -Nice seeing you today!!  -Lab work today; will notify you once results are available.  -Schedule follow up in 6 months or sooner as needed.     Lelon Frohlich, MD [facility]

## 2019-05-23 ENCOUNTER — Encounter: Payer: Self-pay | Admitting: Internal Medicine

## 2019-05-23 ENCOUNTER — Ambulatory Visit: Payer: Self-pay | Admitting: Internal Medicine

## 2019-05-27 ENCOUNTER — Ambulatory Visit: Payer: 59 | Admitting: Family Medicine

## 2019-05-30 ENCOUNTER — Encounter: Payer: Self-pay | Admitting: Family Medicine

## 2019-05-30 ENCOUNTER — Other Ambulatory Visit: Payer: Self-pay

## 2019-05-30 ENCOUNTER — Ambulatory Visit: Payer: Self-pay

## 2019-05-30 ENCOUNTER — Ambulatory Visit (INDEPENDENT_AMBULATORY_CARE_PROVIDER_SITE_OTHER): Payer: 59 | Admitting: Family Medicine

## 2019-05-30 VITALS — BP 140/84 | HR 83 | Ht 68.0 in | Wt 193.0 lb

## 2019-05-30 DIAGNOSIS — M25532 Pain in left wrist: Secondary | ICD-10-CM | POA: Diagnosis not present

## 2019-05-30 DIAGNOSIS — M25531 Pain in right wrist: Secondary | ICD-10-CM | POA: Diagnosis not present

## 2019-05-30 DIAGNOSIS — G5602 Carpal tunnel syndrome, left upper limb: Secondary | ICD-10-CM | POA: Diagnosis not present

## 2019-05-30 NOTE — Progress Notes (Signed)
[facility] [address] [place], [place] [address] Phone: [phone] Subjective:   I Miranda Harris am serving as a Education administrator for Dr. [name].    CC: Wrist pain follow-up  [id number]   04/18/2019 Patient does have carpal tunnel and that seems to be bilateral but left greater than right.  Discussed with patient in great length.  Discussed icing regimen, home exercises, topical anti-inflammatories, bracing.  Patient has been making some progress already with the bracing so I do think patient should do well with conservative therapy.  We discussed different medications and given a low dose of gabapentin at night to that should be fine with her other medications.  We discussed icing regimen.  Patient continues to have pain will consider injections.  Patient will follow-up with me again in 4 weeks  05/30/2019 Miranda Harris is a 58 y.o. female coming in with complaint of wrist pain. States her wrist is about the same.  Patient was found to have a left-sided carpal tunnel.  Discussed conservative care with home exercises discussed which activities of doing which wants to avoid.  Patient has not been doing the exercises regularly.  Patient has not been bracing very regularly either.       Past Medical History:  Diagnosis Date  . Bipolar disorder ([employer])   . Elevated serum creatinine    related to lithium use  . Helicobacter pylori (H. pylori) infection    pt denies  . Hypothyroidism   . Perirectal fistula   . Thyroid disease   . Trochanteric bursitis of right hip    Past Surgical History:  Procedure Laterality Date  . EVALUATION UNDER ANESTHESIA WITH HEMORRHOIDECTOMY N/A 06/28/2017   Procedure: EXAM UNDER ANESTHESIA WITH EXTERNAL HEMORRHOIDECTOMY AND REPAIR OF PERIRECTAL FISTULA;  Surgeon: Michael Boston, MD;  Location: [facility];  Service: General;  Laterality: N/A;  . HERNIA REPAIR  [id number] yrs ago   umbilical hernia  . INCISION AND  DRAINAGE PERIRECTAL ABSCESS N/A 04/18/2017   Procedure: IRRIGATION AND DEBRIDEMENT PERIRECTAL ABSCESS, placement of seton, sigmoidoscopy;  Surgeon: Excell Seltzer, MD;  [facility];  Service: General;  Laterality: N/A;  . LAPAROSCOPIC APPENDECTOMY    . VAGINAL HYSTERECTOMY Bilateral    complete  . WISDOM TOOTH EXTRACTION     Social History   Socioeconomic History  . Marital status: Married    Spouse name: Not on file  . Number of children: Not on file  . Years of education: Not on file  . Highest education level: Not on file  Occupational History  . Not on file  Social Needs  . Financial resource strain: Not on file  . Food insecurity    Worry: Not on file    Inability: Not on file  . Transportation needs    Medical: Not on file    Non-medical: Not on file  Tobacco Use  . Smoking status: Former Smoker    Packs/day: 1.00    Years: 30.00    Pack years: 30.00    Types: Cigarettes  . Smokeless tobacco: Never Used  . Tobacco comment: quit 2008  Substance and Sexual Activity  . Alcohol use: No    Alcohol/week: 0.0 standard drinks    Comment: Occasional use  . Drug use: No  . Sexual activity: Yes    Partners: Male    Birth control/protection: Surgical  Lifestyle  . Physical activity    Days per week: Not on file  Minutes per session: Not on file  . Stress: Not on file  Relationships  . Social Herbalist on phone: Not on file    Gets together: Not on file    Attends religious service: Not on file    Active member of club or organization: Not on file    Attends meetings of clubs or organizations: Not on file    Relationship status: Not on file  Other Topics Concern  . Not on file  Social History Narrative  . Not on file   No Known Allergies Family History  Problem Relation Age of Onset  . Anxiety disorder Brother   . Breast cancer Neg Hx     Current Outpatient Medications (Endocrine & Metabolic):  .  levothyroxine (SYNTHROID, LEVOTHROID)  150 MCG tablet, Take 1 tablet (150 mcg total) by mouth daily.      Current Outpatient Medications (Other):  [name]  Calcium Carbonate-Vitamin D (CALTRATE 600+D PO), Take 2 tablets by mouth daily. [name]  gabapentin (NEURONTIN) 100 MG capsule, Take 2 capsules (200 mg total) by mouth at bedtime. .  lamoTRIgine (LAMICTAL) 25 MG tablet, Take 2 tablets (50 mg total) by mouth daily. .  Multiple Vitamin (MULTIVITAMIN WITH MINERALS) TABS tablet, Take 1 tablet by mouth daily. .  Omega-3 Fatty Acids (FISH OIL) 1000 MG CAPS, Take 2,000 mg by mouth daily. Derrill Memo ON 06/06/2019] PARoxetine (PAXIL) 20 MG tablet, Take 1 tablet (20 mg total) by mouth daily. Derrill Memo ON 06/13/2019] QUEtiapine (SEROQUEL) 100 MG tablet, Take 1.5 tablets (150 mg total) by mouth at bedtime.    Past medical history, social, surgical and family history all reviewed in electronic medical record.  No pertanent information unless stated regarding to the chief complaint.   Review of Systems:  No headache, visual changes, nausea, vomiting, diarrhea, constipation, dizziness, abdominal pain, skin rash, fevers, chills, night sweats, weight loss, swollen lymph nodes, body aches, joint swelling,  chest pain, shortness of breath, mood changes.  Positive muscle aches  Objective  Blood pressure 140/84, pulse 83, height 5\' 8"  (1.727 m), weight 193 lb (87.5 kg), SpO2 97 %.    General: No apparent distress alert and oriented x3 mood, dressed appropriately.  Mild flat affect HEENT: Pupils equal, extraocular movements intact  Respiratory: Patient's speak in full sentences and does not appear short of breath  Cardiovascular: No lower extremity edema, non tender, no erythema  Skin: Warm dry intact with no signs of infection or rash on extremities or on axial skeleton.  Abdomen: Soft nontender  Neuro: Cranial nerves II through XII are intact, neurovascularly intact in all extremities with 2+ DTRs and 2+ pulses.  Lymph: No lymphadenopathy of posterior  or anterior cervical chain or axillae bilaterally.  Gait normal with good balance and coordination.  MSK:  tender with full range of motion and good stability and symmetric strength and tone of shoulders, elbows, , hip, knee and ankles bilaterally.  Mild hypermobility noted Left wrist exam shows the patient still has a positive Tinel's as well as a positive Phalen's.  Wrist does have hypermobility noted.  Good grip strength.  Seems to be neurovascularly intact distally.  Neck exam no loss of lordosis.  Full range of motion negative Spurling's.   Impression and Recommendations:      The above documentation has been reviewed and is accurate and complete Lyndal Pulley, DO       Note: This dictation was prepared with Dragon dictation along with smaller  Company secretary. Any transcriptional errors that result from this process are unintentional.

## 2019-05-30 NOTE — Assessment & Plan Note (Signed)
Patient continues to be somewhat symptomatic but it is not disabling at this moment.  Encourage patient consider continuing to do the brace at night.  Patient has the exercises and encouraged her to do them on a fairly regular basis.  Patient did not want to try any other medications other than the gabapentin.  We discussed the possibility of injections, formal physical therapy which patient has declined.  At this juncture patient does not want to follow-up with me again until as needed.

## 2019-06-06 ENCOUNTER — Ambulatory Visit: Payer: Medicare Other | Admitting: Family Medicine

## 2019-06-25 ENCOUNTER — Encounter: Payer: Self-pay | Admitting: Family Medicine

## 2019-06-25 ENCOUNTER — Ambulatory Visit: Payer: Self-pay

## 2019-06-25 ENCOUNTER — Other Ambulatory Visit: Payer: Self-pay

## 2019-06-25 ENCOUNTER — Ambulatory Visit (INDEPENDENT_AMBULATORY_CARE_PROVIDER_SITE_OTHER): Payer: 59 | Admitting: Family Medicine

## 2019-06-25 VITALS — BP 150/80 | HR 76 | Ht 68.0 in | Wt 191.0 lb

## 2019-06-25 DIAGNOSIS — M25532 Pain in left wrist: Secondary | ICD-10-CM | POA: Diagnosis not present

## 2019-06-25 DIAGNOSIS — M25531 Pain in right wrist: Secondary | ICD-10-CM

## 2019-06-25 DIAGNOSIS — G5602 Carpal tunnel syndrome, left upper limb: Secondary | ICD-10-CM | POA: Diagnosis not present

## 2019-06-25 NOTE — Assessment & Plan Note (Signed)
Patient given injection and tolerated the procedure well.  We discussed which activities to do which wants to avoid.  Discussed posture and ergonomics.  Discussed which bracing at night.  Patient is having considerable pain on the contralateral side and will come back again in 2 to 3 weeks and will consider injection on the left.

## 2019-06-25 NOTE — Progress Notes (Signed)
[facility] [place] Harris, [place] [id number] Phone: [phone] Subjective:   I Miranda Harris am serving as a Education administrator for Dr. Hulan Harris.  I'm seeing this patient by the request  of:    CC: Left wrist pain follow-up  [id number]   05/30/2019 Patient continues to be somewhat symptomatic but it is not disabling at this moment.  Encourage patient consider continuing to do the brace at night.  Patient has the exercises and encouraged her to do them on a fairly regular basis.  Patient did not want to try any other medications other than the gabapentin.  We discussed the possibility of injections, formal physical therapy which patient has declined.  At this juncture patient does not want to follow-up with me again until as needed.  06/25/2019 Miranda Harris is a 58 y.o. female coming in with complaint of bilateral wrist pain. Patient states she would like bilateral injections.  Patient has had carpal tunnel bilaterally left greater than right.  Patient did do a lot of activity over the weekend which seemed to exacerbate it.  Now having chronic numbness and feels like she is dropping things.    Past Medical History:  Diagnosis Date  . Bipolar disorder (Miranda Harris)   . Elevated serum creatinine    related to lithium use  . Helicobacter pylori (H. pylori) infection    pt denies  . Hypothyroidism   . Perirectal fistula   . Thyroid disease   . Trochanteric bursitis of right hip    Past Surgical History:  Procedure Laterality Date  . EVALUATION UNDER ANESTHESIA WITH HEMORRHOIDECTOMY N/A 06/28/2017   Procedure: EXAM UNDER ANESTHESIA WITH EXTERNAL HEMORRHOIDECTOMY AND REPAIR OF PERIRECTAL FISTULA;  Surgeon: Miranda Boston, MD;  Location: [facility];  Service: General;  Laterality: N/A;  . HERNIA REPAIR  [id number] yrs ago   umbilical hernia  . INCISION AND DRAINAGE PERIRECTAL ABSCESS N/A 04/18/2017   Procedure: IRRIGATION AND DEBRIDEMENT PERIRECTAL  ABSCESS, placement of seton, sigmoidoscopy;  Surgeon: Miranda Seltzer, MD;  Location: [facility];  Service: General;  Laterality: N/A;  . LAPAROSCOPIC APPENDECTOMY    . VAGINAL HYSTERECTOMY Bilateral    complete  . WISDOM TOOTH EXTRACTION     Social History   Socioeconomic History  . Marital status: Married    Spouse name: Not on file  . Number of children: Not on file  . Years of education: Not on file  . Highest education level: Not on file  Occupational History  . Not on file  Social Needs  . Financial resource strain: Not on file  . Food insecurity    Worry: Not on file    Inability: Not on file  . Transportation needs    Medical: Not on file    Non-medical: Not on file  Tobacco Use  . Smoking status: Former Smoker    Packs/day: 1.00    Years: 30.00    Pack years: 30.00    Types: Cigarettes  . Smokeless tobacco: Never Used  . Tobacco comment: quit 2008  Substance and Sexual Activity  . Alcohol use: No    Alcohol/week: 0.0 standard drinks    Comment: Occasional use  . Drug use: No  . Sexual activity: Yes    Partners: Male    Birth control/protection: Surgical  Lifestyle  . Physical activity    Days per week: Not on file    Minutes per session: Not on file  . Stress: Not on file  Relationships  . Social Herbalist on phone: Not on file    Gets together: Not on file    Attends religious service: Not on file    Active member of club or organization: Not on file    Attends meetings of clubs or organizations: Not on file    Relationship status: Not on file  Other Topics Concern  . Not on file  Social History Narrative  . Not on file   No Known Allergies Family History  Problem Relation Age of Onset  . Anxiety disorder Brother   . Breast cancer Neg Hx     Current Outpatient Medications (Endocrine & Metabolic):  .  levothyroxine (SYNTHROID, LEVOTHROID) 150 MCG tablet, Take 1 tablet (150 mcg total) by mouth daily.      Current Outpatient  Medications (Other):  [facility]anda [facility]  Calcium Carbonate-Vitamin D (CALTRATE 600+D PO), Take 2 tablets by mouth daily. Miranda Harris  gabapentin (NEURONTIN) 100 MG capsule, Take 2 capsules (200 mg total) by mouth at bedtime. .  lamoTRIgine (LAMICTAL) 25 MG tablet, Take 2 tablets (50 mg total) by mouth daily. .  Multiple Vitamin (MULTIVITAMIN WITH MINERALS) TABS tablet, Take 1 tablet by mouth daily. .  Omega-3 Fatty Acids (FISH OIL) 1000 MG CAPS, Take 2,000 mg by mouth daily. Miranda Harris  PARoxetine (PAXIL) 20 MG tablet, Take 1 tablet (20 mg total) by mouth daily. .  QUEtiapine (SEROQUEL) 100 MG tablet, Take 1.5 tablets (150 mg total) by mouth at bedtime.    Past medical history, social, surgical and family history all reviewed in electronic medical record.  No pertanent information unless stated regarding to the chief complaint.   Review of Systems:  No headache, visual changes, nausea, vomiting, diarrhea, constipation, dizziness, abdominal pain, skin rash, fevers, chills, night sweats, weight loss, swollen lymph nodes, body aches, joint swelling, muscle aches, chest pain, shortness of breath, mood changes.   Objective  There were no vitals taken for this visit.    General: No apparent distress alert and oriented x3 mood and affect normal, dressed appropriately.  HEENT: Pupils equal, extraocular movements intact  Respiratory: Patient's speak in full sentences and does not appear short of breath  Cardiovascular: No lower extremity edema, non tender, no erythema  Skin: Warm dry intact with no signs of infection or rash on extremities or on axial skeleton.  Abdomen: Soft nontender  Neuro: Cranial nerves II through XII are intact, neurovascularly intact in all extremities with 2+ DTRs and 2+ pulses.  Lymph: No lymphadenopathy of posterior or anterior cervical chain or axillae bilaterally.  Gait normal with good balance and coordination.  MSK:  Non tender with full range of motion and good stability and symmetric strength and  tone of shoulders, elbows,  hip, knee and ankles bilaterally.  Wrist: Left Inspection normal with no visible erythema or swelling. ROM smooth and normal with good flexion and extension and ulnar/radial deviation that is symmetrical with opposite wrist. Palpation is normal over metacarpals, navicular, lunate, and TFCC; tendons without tenderness/ swelling No snuffbox tenderness. No tenderness over Canal of Gu[place]. Strength 5/5 in all directions without pain. Negative Finkelstein, positive tinel's and phalens. Negative Watson's test.  Procedure: Real-time Ultrasound Guided Injection of left carpal tunnel Device: GE Logiq Q7 Ultrasound guided injection is preferred based studies that show increased duration, increased effect, greater accuracy, decreased procedural pain, increased response rate with ultrasound guided versus blind injection.  Verbal informed consent obtained.  Time-out conducted.  Noted no overlying erythema, induration, or other  signs of local infection.  Skin prepped in a sterile fashion.  Local anesthesia: Topical Ethyl chloride.  With sterile technique and under real time ultrasound guidance:  median nerve visualized.  23g 5/8 inch needle inserted distal to proximal approach into nerve sheath. Pictures taken nfor needle placement. Patient did have injection of 2 cc of 0.5% Marcaine, and 1 cc of Kenalog 40 mg/dL. Completed without difficulty  Pain immediately resolved suggesting accurate placement of the medication.  Advised to call if fevers/chills, erythema, induration, drainage, or persistent bleeding.  Images permanently stored and available for review in the ultrasound unit.  Impression: Technically successful ultrasound guided injection.   Impression and Recommendations:     This case required medical decision making of moderate complexity. The above documentation has been reviewed and is accurate and complete Lyndal Pulley, DO       Note: This dictation was  prepared with Dragon dictation along with smaller phrase technology. Any transcriptional errors that result from this process are unintentional.

## 2019-06-25 NOTE — Patient Instructions (Signed)
Good to see you  We did the left side today  Ice 20 minutes 2 times daily. Usually after activity and before bed. Exercises 3 times a week.  See me again in 4 weeks and could do the other side

## 2019-07-04 ENCOUNTER — Telehealth: Payer: Self-pay | Admitting: *Deleted

## 2019-07-04 DIAGNOSIS — G5602 Carpal tunnel syndrome, left upper limb: Secondary | ICD-10-CM

## 2019-07-04 NOTE — Telephone Encounter (Signed)
Pt called requesting a referral to a hand surgeon for her carpal tunnel.  Referral entered.

## 2019-07-08 ENCOUNTER — Encounter: Payer: Self-pay | Admitting: Internal Medicine

## 2019-07-09 ENCOUNTER — Ambulatory Visit: Payer: 59 | Admitting: Family Medicine

## 2019-07-11 ENCOUNTER — Ambulatory Visit (INDEPENDENT_AMBULATORY_CARE_PROVIDER_SITE_OTHER): Payer: 59 | Admitting: Internal Medicine

## 2019-07-11 ENCOUNTER — Encounter: Payer: Self-pay | Admitting: Internal Medicine

## 2019-07-11 ENCOUNTER — Other Ambulatory Visit: Payer: Self-pay

## 2019-07-11 VITALS — BP 110/80 | HR 74 | Temp 97.6°F | Wt 191.2 lb

## 2019-07-11 DIAGNOSIS — B029 Zoster without complications: Secondary | ICD-10-CM

## 2019-07-11 MED ORDER — VALACYCLOVIR HCL 1 G PO TABS: 1000.0000 mg | ORAL_TABLET | Freq: Three times a day (TID) | ORAL | 0 refills | Status: AC

## 2019-07-11 NOTE — Progress Notes (Signed)
Established Patient Office Visit     CC/Reason for Visit: Painful, itchy rash on her right arm  HPI: Miranda Harris is a 58 y.o. female who is coming in today for the above mentioned reasons.  This rash popped up about 4 days ago.  It has become extremely itchy, she has been scratching nonstop.  She describes it as a burning type sensation.  She also tells me that since I last saw her she again visited Dr. Tamala Julian with sports medicine in regards to her carpal tunnel syndrome who has referred her to orthopedics to consider carpal tunnel release surgery.  Past Medical/Surgical History: Past Medical History:  Diagnosis Date  . Bipolar disorder ([place])   . Elevated serum creatinine    related to lithium use  . Helicobacter pylori (H. pylori) infection    pt denies  . Hypothyroidism   . Perirectal fistula   . Thyroid disease   . Trochanteric bursitis of right hip     Past Surgical History:  Procedure Laterality Date  . EVALUATION UNDER ANESTHESIA WITH HEMORRHOIDECTOMY N/A 06/28/2017   Procedure: EXAM UNDER ANESTHESIA WITH EXTERNAL HEMORRHOIDECTOMY AND REPAIR OF PERIRECTAL FISTULA;  Surgeon: Michael Boston, MD;  Location: [facility];  Service: General;  Laterality: N/A;  . HERNIA REPAIR  [id number] yrs ago   umbilical hernia  . INCISION AND DRAINAGE PERIRECTAL ABSCESS N/A 04/18/2017   Procedure: IRRIGATION AND DEBRIDEMENT PERIRECTAL ABSCESS, placement of seton, sigmoidoscopy;  Surgeon: Excell Seltzer, MD;  Location: [facility];  Service: General;  Laterality: N/A;  . LAPAROSCOPIC APPENDECTOMY    . VAGINAL HYSTERECTOMY Bilateral    complete  . WISDOM TOOTH EXTRACTION      Social History:  reports that she has quit smoking. Her smoking use included cigarettes. She has a 30.00 pack-year smoking history. She has never used smokeless tobacco. She reports that she does not drink alcohol or use drugs.  Allergies: No Known Allergies  Family History:  Family History   Problem Relation Age of Onset  . Anxiety disorder Brother   . Breast cancer Neg Hx      Current Outpatient Medications:  .  Calcium Carbonate-Vitamin D (CALTRATE 600+D PO), Take 2 tablets by mouth daily., Disp: , Rfl:  .  lamoTRIgine (LAMICTAL) 25 MG tablet, Take 2 tablets (50 mg total) by mouth daily., Disp: 180 tablet, Rfl: 0 .  levothyroxine (SYNTHROID, LEVOTHROID) 150 MCG tablet, Take 1 tablet (150 mcg total) by mouth daily., Disp: 90 tablet, Rfl: 3 .  Multiple Vitamin (MULTIVITAMIN WITH MINERALS) TABS tablet, Take 1 tablet by mouth daily., Disp: , Rfl:  .  Omega-3 Fatty Acids (FISH OIL) 1000 MG CAPS, Take 2,000 mg by mouth daily., Disp: , Rfl:  .  PARoxetine (PAXIL) 20 MG tablet, Take 1 tablet (20 mg total) by mouth daily., Disp: 90 tablet, Rfl: 0 .  QUEtiapine (SEROQUEL) 100 MG tablet, Take 1.5 tablets (150 mg total) by mouth at bedtime., Disp: 135 tablet, Rfl: 0 .  valACYclovir (VALTREX) 1000 MG tablet, Take 1 tablet (1,000 mg total) by mouth 3 (three) times daily for 7 days., Disp: 21 tablet, Rfl: 0  Review of Systems:  Constitutional: Denies fever, chills, diaphoresis, appetite change and fatigue.  HEENT: Denies photophobia, eye pain, redness, hearing loss, ear pain, congestion, sore throat, rhinorrhea, sneezing, mouth sores, trouble swallowing, neck pain, neck stiffness and tinnitus.   Respiratory: Denies SOB, DOE, cough, chest tightness,  and wheezing.   Cardiovascular: Denies chest pain, palpitations and  leg swelling.  Gastrointestinal: Denies nausea, vomiting, abdominal pain, diarrhea, constipation, blood in stool and abdominal distention.  Genitourinary: Denies dysuria, urgency, frequency, hematuria, flank pain and difficulty urinating.  Endocrine: Denies: hot or cold intolerance, sweats, changes in hair or nails, polyuria, polydipsia. Musculoskeletal: Denies myalgias, back pain, joint swelling, arthralgias and gait problem.   Neurological: Denies dizziness, seizures, syncope,  weakness, light-headedness, numbness and headaches.  Hematological: Denies adenopathy. Easy bruising, personal or family bleeding history  Psychiatric/Behavioral: Denies suicidal ideation, mood changes, confusion, nervousness, sleep disturbance and agitation    Physical Exam: Vitals:   07/11/19 1023  BP: 110/80  Pulse: 74  Temp: 97.6 F (36.4 C)  TempSrc: Temporal  SpO2: 95%  Weight: 191 lb 3.2 oz (86.7 kg)    Body mass index is 29.07 kg/m.   Constitutional: NAD, calm, comfortable Eyes: PERRL, lids and conjunctivae normal ENMT: Mucous membranes are moist.  Skin: Vesicular type rash over outer aspect of right arm in dermatomal distribution Neurologic: Grossly intact and nonfocal Psychiatric: Normal judgment and insight. Alert and oriented x 3. Normal mood.    Impression and Plan:  Herpes zoster without complication  -Valtrex 1 g 3 times a day for 7 days. -Return to clinic in 10 to 14 days if no improvement.    Patient Instructions  -Nice seeing you today!!  -START Valtrex 1000 mg 3 times a day for 7 days.   Shingles  Shingles is an infection. It gives you a painful skin rash and blisters that have fluid in them. Shingles is caused by the same germ (virus) that causes chickenpox. Shingles only happens in people who:  Have had chickenpox.  Have been given a shot of medicine (vaccine) to protect against chickenpox. Shingles is rare in this group. The first symptoms of shingles may be itching, tingling, or pain in an area on your skin. A rash will show on your skin a few days or weeks later. The rash is likely to be on one side of your body. The rash usually has a shape like a belt or a band. Over time, the rash turns into fluid-filled blisters. The blisters will break open, change into scabs, and dry up. Medicines may:  Help with pain and itching.  Help you get better sooner.  Help to prevent long-term problems. Follow these instructions at home: Medicines   Take over-the-counter and prescription medicines only as told by your doctor.  Put on an anti-itch cream or numbing cream where you have a rash, blisters, or scabs. Do this as told by your doctor. Helping with itching and discomfort   Put cold, wet cloths (cold compresses) on the area of the rash or blisters as told by your doctor.  Cool baths can help you feel better. Try adding baking soda or dry oatmeal to the water to lessen itching. Do not bathe in hot water. Blister and rash care  Keep your rash covered with a loose bandage (dressing).  Wear loose clothing that does not rub on your rash.  Keep your rash and blisters clean. To do this, wash the area with mild soap and cool water as told by your doctor.  Check your rash every day for signs of infection. Check for: ? More redness, swelling, or pain. ? Fluid or blood. ? Warmth. ? Pus or a bad smell.  Do not scratch your rash. Do not pick at your blisters. To help you to not scratch: ? Keep your fingernails clean and cut short. ? Wear gloves or  mittens when you sleep, if scratching is a problem. General instructions  Rest as told by your doctor.  Keep all follow-up visits as told by your doctor. This is important.  Wash your hands often with soap and water. If soap and water are not available, use hand sanitizer. Doing this lowers your chance of getting a skin infection caused by germs (bacteria).  Your infection can cause chickenpox in people who have never had chickenpox or never got a shot of chickenpox vaccine. If you have blisters that did not change into scabs yet, try not to touch other people or be around other people, especially: ? Babies. ? Pregnant women. ? Children who have areas of red, itchy, or rough skin (eczema). ? Very old people who have transplants. ? People who have a long-term (chronic) sickness, like cancer or AIDS. Contact a doctor if:  Your pain does not get better with medicine.  Your pain does  not get better after the rash heals.  You have any signs of infection in the rash area. These signs include: ? More redness, swelling, or pain around the rash. ? Fluid or blood coming from the rash. ? The rash area feeling warm to the touch. ? Pus or a bad smell coming from the rash. Get help right away if:  The rash is on your face or nose.  You have pain in your face or pain by your eye.  You lose feeling on one side of your face.  You have trouble seeing.  You have ear pain, or you have ringing in your ear.  You have a loss of taste.  Your condition gets worse. Summary  Shingles gives you a painful skin rash and blisters that have fluid in them.  Shingles is an infection. It is caused by the same germ (virus) that causes chickenpox.  Keep your rash covered with a loose bandage (dressing). Wear loose clothing that does not rub on your rash.  If you have blisters that did not change into scabs yet, try not to touch other people or be around people. This information is not intended to replace advice given to you by your health care provider. Make sure you discuss any questions you have with your health care provider. Document Released: 02/29/2008 Document Revised: 01/04/2019 Document Reviewed: 05/17/2017 Elsevier Patient Education  2020 [place], [place] [facility]

## 2019-07-11 NOTE — Patient Instructions (Signed)
-Nice seeing you today!!  -START Valtrex 1000 mg 3 times a day for 7 days.   Shingles  Shingles is an infection. It gives you a painful skin rash and blisters that have fluid in them. Shingles is caused by the same germ (virus) that causes chickenpox. Shingles only happens in people who:  Have had chickenpox.  Have been given a shot of medicine (vaccine) to protect against chickenpox. Shingles is rare in this group. The first symptoms of shingles may be itching, tingling, or pain in an area on your skin. A rash will show on your skin a few days or weeks later. The rash is likely to be on one side of your body. The rash usually has a shape like a belt or a band. Over time, the rash turns into fluid-filled blisters. The blisters will break open, change into scabs, and dry up. Medicines may:  Help with pain and itching.  Help you get better sooner.  Help to prevent long-term problems. Follow these instructions at home: Medicines  Take over-the-counter and prescription medicines only as told by your doctor.  Put on an anti-itch cream or numbing cream where you have a rash, blisters, or scabs. Do this as told by your doctor. Helping with itching and discomfort   Put cold, wet cloths (cold compresses) on the area of the rash or blisters as told by your doctor.  Cool baths can help you feel better. Try adding baking soda or dry oatmeal to the water to lessen itching. Do not bathe in hot water. Blister and rash care  Keep your rash covered with a loose bandage (dressing).  Wear loose clothing that does not rub on your rash.  Keep your rash and blisters clean. To do this, wash the area with mild soap and cool water as told by your doctor.  Check your rash every day for signs of infection. Check for: ? More redness, swelling, or pain. ? Fluid or blood. ? Warmth. ? Pus or a bad smell.  Do not scratch your rash. Do not pick at your blisters. To help you to not scratch: ? Keep your  fingernails clean and cut short. ? Wear gloves or mittens when you sleep, if scratching is a problem. General instructions  Rest as told by your doctor.  Keep all follow-up visits as told by your doctor. This is important.  Wash your hands often with soap and water. If soap and water are not available, use hand sanitizer. Doing this lowers your chance of getting a skin infection caused by germs (bacteria).  Your infection can cause chickenpox in people who have never had chickenpox or never got a shot of chickenpox vaccine. If you have blisters that did not change into scabs yet, try not to touch other people or be around other people, especially: ? Babies. ? Pregnant women. ? Children who have areas of red, itchy, or rough skin (eczema). ? Very old people who have transplants. ? People who have a long-term (chronic) sickness, like cancer or AIDS. Contact a doctor if:  Your pain does not get better with medicine.  Your pain does not get better after the rash heals.  You have any signs of infection in the rash area. These signs include: ? More redness, swelling, or pain around the rash. ? Fluid or blood coming from the rash. ? The rash area feeling warm to the touch. ? Pus or a bad smell coming from the rash. Get help right away if:  The rash is on your face or nose.  You have pain in your face or pain by your eye.  You lose feeling on one side of your face.  You have trouble seeing.  You have ear pain, or you have ringing in your ear.  You have a loss of taste.  Your condition gets worse. Summary  Shingles gives you a painful skin rash and blisters that have fluid in them.  Shingles is an infection. It is caused by the same germ (virus) that causes chickenpox.  Keep your rash covered with a loose bandage (dressing). Wear loose clothing that does not rub on your rash.  If you have blisters that did not change into scabs yet, try not to touch other people or be around  people. This information is not intended to replace advice given to you by your health care provider. Make sure you discuss any questions you have with your health care provider. Document Released: 02/29/2008 Document Revised: 01/04/2019 Document Reviewed: 05/17/2017 Elsevier Patient Education  2020 [employer].

## 2019-07-16 DIAGNOSIS — M1811 Unilateral primary osteoarthritis of first carpometacarpal joint, right hand: Secondary | ICD-10-CM | POA: Diagnosis not present

## 2019-07-16 DIAGNOSIS — R200 Anesthesia of skin: Secondary | ICD-10-CM | POA: Diagnosis not present

## 2019-07-16 DIAGNOSIS — M1812 Unilateral primary osteoarthritis of first carpometacarpal joint, left hand: Secondary | ICD-10-CM | POA: Diagnosis not present

## 2019-07-22 ENCOUNTER — Other Ambulatory Visit: Payer: Self-pay | Admitting: Internal Medicine

## 2019-07-23 DIAGNOSIS — M1811 Unilateral primary osteoarthritis of first carpometacarpal joint, right hand: Secondary | ICD-10-CM | POA: Diagnosis not present

## 2019-07-23 NOTE — Progress Notes (Signed)
Virtual Visit via Video Note  I connected with Miranda Harris on 07/29/19 at  3:00 PM EST by a video enabled telemedicine application and verified that I am speaking with the correct person using two identifiers.   I discussed the limitations of evaluation and management by telemedicine and the availability of in person appointments. The patient expressed understanding and agreed to proceed.     I discussed the assessment and treatment plan with the patient. The patient was provided an opportunity to ask questions and all were answered. The patient agreed with the plan and demonstrated an understanding of the instructions.   The patient was advised to call back or seek an in-person evaluation if the symptoms worsen or if the condition fails to improve as anticipated.  I provided 15 minutes of non-face-to-face time during this encounter.   [name], MD    [facility] MD/PA/NP OP Progress Note  07/29/2019 3:25 PM Miranda Harris  MRN:  [id number]  Chief Complaint:  Chief Complaint    Follow-up; Other     HPI:  This is a follow-up appointment for bipolar disorder.  She states that she has been doing fine except that she feels tired of watching news of politics, virus, and violence around the world. She also feels stressed of having less socialization due to pandemic. She enjoyed grilling with 8-10 people on labor day. She is also looking forward to the Christmas party at her husband's company. She decided to turn down the job opportunity as she wants to work on her treatment for carpel tunnel syndrome, which she will likely have surgery in the near future. She hopes to find a job again after holidays. She denies insomnia.  She denies anhedonia.  She enjoys working on quilt, and made a curtain.  She reports good energy and motivation. She denies SI. She feels less anxious. She denies panic attacks.  She denies decreased sleep or euphoria. She feels good that she had lost weight since  switching from lithium to lamotrigine.   Wt Readings from Last 3 Encounters:  07/11/19 191 lb 3.2 oz (86.7 kg)  06/25/19 191 lb (86.6 kg)  05/30/19 193 lb (87.5 kg)    Visit Diagnosis:    ICD-10-CM   1. Bipolar 1 disorder ([place])  F31.9     Past Psychiatric History: Please see initial evaluation for full details. I have reviewed the history. No updates at this time.    Past Medical History:  Past Medical History:  Diagnosis Date  . Bipolar disorder ([place])   . Elevated serum creatinine    related to lithium use  . Helicobacter pylori (H. pylori) infection    pt denies  . Hypothyroidism   . Perirectal fistula   . Thyroid disease   . Trochanteric bursitis of right hip     Past Surgical History:  Procedure Laterality Date  . EVALUATION UNDER ANESTHESIA WITH HEMORRHOIDECTOMY N/A 06/28/2017   Procedure: EXAM UNDER ANESTHESIA WITH EXTERNAL HEMORRHOIDECTOMY AND REPAIR OF PERIRECTAL FISTULA;  Surgeon: Michael Boston, MD;  Location: [facility];  Service: General;  Laterality: N/A;  . HERNIA REPAIR  [id number] yrs ago   umbilical hernia  . INCISION AND DRAINAGE PERIRECTAL ABSCESS N/A 04/18/2017   Procedure: IRRIGATION AND DEBRIDEMENT PERIRECTAL ABSCESS, placement of seton, sigmoidoscopy;  Surgeon: Excell Seltzer, MD;  [facility];  Service: General;  Laterality: N/A;  . LAPAROSCOPIC APPENDECTOMY    . VAGINAL HYSTERECTOMY Bilateral    complete  . WISDOM TOOTH EXTRACTION  Family Psychiatric History: Please see initial evaluation for full details. I have reviewed the history. No updates at this time.     Family History:  Family History  Problem Relation Age of Onset  . Anxiety disorder Brother   . Breast cancer Neg Hx     Social History:  Social History   Socioeconomic History  . Marital status: Married    Spouse name: Not on file  . Number of children: Not on file  . Years of education: Not on file  . Highest education level: Not on file   Occupational History  . Not on file  Social Needs  . Financial resource strain: Not on file  . Food insecurity    Worry: Not on file    Inability: Not on file  . Transportation needs    Medical: Not on file    Non-medical: Not on file  Tobacco Use  . Smoking status: Former Smoker    Packs/day: 1.00    Years: 30.00    Pack years: 30.00    Types: Cigarettes  . Smokeless tobacco: Never Used  . Tobacco comment: quit 2008  Substance and Sexual Activity  . Alcohol use: No    Alcohol/week: 0.0 standard drinks    Comment: Occasional use  . Drug use: No  . Sexual activity: Yes    Partners: Male    Birth control/protection: Surgical  Lifestyle  . Physical activity    Days per week: Not on file    Minutes per session: Not on file  . Stress: Not on file  Relationships  . Social Herbalist on phone: Not on file    Gets together: Not on file    Attends religious service: Not on file    Active member of club or organization: Not on file    Attends meetings of clubs or organizations: Not on file    Relationship status: Not on file  Other Topics Concern  . Not on file  Social History Narrative  . Not on file    Allergies: No Known Allergies  Metabolic Disorder Labs: Lab Results  Component Value Date   HGBA1C 5.4 06/10/2015   MPG 108 06/10/2015   No results found for: PROLACTIN Lab Results  Component Value Date   CHOL 208 (H) 11/30/2018   TRIG 116.0 11/30/2018   HDL 55.00 11/30/2018   CHOLHDL 4 11/30/2018   VLDL 23.2 11/30/2018   LDLCALC 130 (H) 11/30/2018   Lab Results  Component Value Date   TSH 1.48 05/22/2019   TSH 3.04 11/06/2018    Therapeutic Level Labs: Lab Results  Component Value Date   LITHIUM 1.2 11/08/2018   LITHIUM 0.80 06/10/2015   No results found for: VALPROATE No components found for:  CBMZ  Current Medications: Current Outpatient Medications  Medication Sig Dispense Refill  . Calcium Carbonate-Vitamin D (CALTRATE 600+D PO)  Take 2 tablets by mouth daily.    Derrill Memo ON 08/26/2019] lamoTRIgine (LAMICTAL) 25 MG tablet Take 2 tablets (50 mg total) by mouth daily. 180 tablet 1  . levothyroxine (SYNTHROID, LEVOTHROID) 150 MCG tablet Take 1 tablet (150 mcg total) by mouth daily. 90 tablet 3  . Multiple Vitamin (MULTIVITAMIN WITH MINERALS) TABS tablet Take 1 tablet by mouth daily.    . Omega-3 Fatty Acids (FISH OIL) 1000 MG CAPS Take 2,000 mg by mouth daily.    Derrill Memo ON 09/03/2019] PARoxetine (PAXIL) 20 MG tablet Take 1 tablet (20 mg total) by mouth daily.  90 tablet 1  . [START ON 09/11/2019] QUEtiapine (SEROQUEL) 100 MG tablet Take 1.5 tablets (150 mg total) by mouth at bedtime. 135 tablet 1   No current facility-administered medications for this visit.      Musculoskeletal: Strength & Muscle Tone: N/A Gait & Station: N/A Patient leans: N/A  Psychiatric Specialty Exam: Review of Systems  Psychiatric/Behavioral: Negative for depression, hallucinations, memory loss, substance abuse and suicidal ideas. The patient is nervous/anxious. The patient does not have insomnia.   All other systems reviewed and are negative.   There were no vitals taken for this visit.There is no height or weight on file to calculate BMI.  General Appearance: Fairly Groomed  Eye Contact:  Good  Speech:  Clear and Coherent  Volume:  Normal  Mood:  "good"  Affect:  Appropriate, Congruent and euthymic, reactive  Thought Process:  Coherent  Orientation:  Full (Time, Place, and Person)  Thought Content: Logical   Suicidal Thoughts:  No  Homicidal Thoughts:  No  Memory:  Immediate;   Good  Judgement:  Good  Insight:  Fair  Psychomotor Activity:  Normal  Concentration:  Concentration: Good and Attention Span: Good  Recall:  Good  Fund of Knowledge: Good  Language: Good  Akathisia:  No  Handed:  Right  AIMS (if indicated): not done  Assets:  Communication Skills Desire for Improvement  ADL's:  Intact  Cognition: WNL  Sleep:   Good   Screenings: PHQ2-9     Office Visit from 11/08/2018 in [place] at [place]  PHQ-2 Total Score  0       Assessment and Plan:  Verneda Salamat is a 58 y.o. 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  We will continue lamotrigine to target bipolar 1 disorder.  Discussed potential side effect of Stevens-Johnson syndrome.  We will continue Paxil for bipolar depression.  We will continue quetiapine to target bipolar 1 disorder.  Discussed potential metabolic side effects and EPS.  Discussed behavioral activation.    Plan:  I have reviewed and updated plans as below 1. Continue Paxil 20 mgat night  2. Continue lamotrigine 50 mg daily  3.Continue quetiapine 150 mg at night 4.Next appointment: in January  Past trials of medication: Sertraline,fluoxetine (drowsiness),Lexapro,Duloxetine,venlafaxine (initially worked well, stopped working, hair loss),Wellbutrin(insomnia),Latuda,Abilify (flu like symptoms), Haldol (EPS), Thorazine (EPS), Stelazine, Lithium, Lunesta (sedation), Ambien (sleepwalking, driving, eating), Trazodone (excessive sedation), Xanax  The patient demonstrates the following risk factors for suicide: Chronic risk factorsfor suicide include psychiatric disorder /bipolar disorder,history of substance use disorder, family history of suicide attempt. Acute risk factorsfor suicide includenone.Protective factorsfor this patient include positive social support, responsibility to others (family), coping skills, hope for the future. Although patient reports passive SI, she adamantly denies any intent and has  had no previous suicide attempt.Considering these factors, the overall suicide risk at this point appears to be low.  [name], MD 07/29/2019, 3:25 PM

## 2019-07-26 ENCOUNTER — Ambulatory Visit: Payer: 59 | Admitting: Internal Medicine

## 2019-07-29 ENCOUNTER — Encounter (HOSPITAL_COMMUNITY): Payer: Self-pay | Admitting: Psychiatry

## 2019-07-29 ENCOUNTER — Ambulatory Visit (INDEPENDENT_AMBULATORY_CARE_PROVIDER_SITE_OTHER): Payer: 59 | Admitting: Psychiatry

## 2019-07-29 ENCOUNTER — Other Ambulatory Visit: Payer: Self-pay

## 2019-07-29 DIAGNOSIS — F319 Bipolar disorder, unspecified: Secondary | ICD-10-CM | POA: Diagnosis not present

## 2019-07-29 MED ORDER — QUETIAPINE FUMARATE 100 MG PO TABS: 150.0000 mg | ORAL_TABLET | Freq: Every day | ORAL | 1 refills | Status: DC

## 2019-07-29 MED ORDER — LAMOTRIGINE 25 MG PO TABS: 50.0000 mg | ORAL_TABLET | Freq: Every day | ORAL | 1 refills | Status: DC

## 2019-07-29 MED ORDER — PAROXETINE HCL 20 MG PO TABS: 20.0000 mg | ORAL_TABLET | Freq: Every day | ORAL | 1 refills | Status: DC

## 2019-07-31 DIAGNOSIS — G5601 Carpal tunnel syndrome, right upper limb: Secondary | ICD-10-CM | POA: Diagnosis not present

## 2019-08-02 DIAGNOSIS — G5602 Carpal tunnel syndrome, left upper limb: Secondary | ICD-10-CM | POA: Diagnosis not present

## 2019-08-06 ENCOUNTER — Ambulatory Visit: Payer: 59 | Admitting: Internal Medicine

## 2019-08-15 ENCOUNTER — Telehealth: Payer: Self-pay | Admitting: *Deleted

## 2019-08-15 ENCOUNTER — Telehealth: Payer: Self-pay | Admitting: Internal Medicine

## 2019-08-15 NOTE — Telephone Encounter (Signed)
Patient dropped off Wellness Screening Form to have completed  Call patient to pick up forms at [phone]  Disposition: Dr's Folder  PLEASE ask Dr. Jerilee Hoh Team to fil out by EOD Friday 11/20 -- we need to scan these and send electronically.

## 2019-08-15 NOTE — Telephone Encounter (Signed)
Copied from [place] [id number]. Topic: General - Inquiry >> Aug 15, 2019  1:16 PM Alease Frame wrote: Reason for CRM: Patient would like to drop off medicare forms for her and her husband that's needs to be filled out by Dr Jerilee Hoh for insurance purposes . Please advise

## 2019-08-16 NOTE — Telephone Encounter (Signed)
Forms completed

## 2019-08-16 NOTE — Telephone Encounter (Signed)
Placed in Dr Hernandez's folder 

## 2019-08-16 NOTE — Telephone Encounter (Signed)
Ready for pick up and patient is aware

## 2019-08-19 NOTE — Telephone Encounter (Signed)
Pt came and picked up the forms for her and her spouse.  Per Form "No Charge"

## 2019-09-06 DIAGNOSIS — G5601 Carpal tunnel syndrome, right upper limb: Secondary | ICD-10-CM | POA: Diagnosis not present

## 2019-10-15 ENCOUNTER — Ambulatory Visit (HOSPITAL_COMMUNITY): Payer: Medicare Other | Admitting: Psychiatry

## 2019-11-04 ENCOUNTER — Encounter: Payer: Self-pay | Admitting: Psychiatry

## 2019-11-04 ENCOUNTER — Other Ambulatory Visit: Payer: Self-pay

## 2019-11-04 ENCOUNTER — Ambulatory Visit (INDEPENDENT_AMBULATORY_CARE_PROVIDER_SITE_OTHER): Payer: 59 | Admitting: Psychiatry

## 2019-11-04 DIAGNOSIS — F319 Bipolar disorder, unspecified: Secondary | ICD-10-CM | POA: Diagnosis not present

## 2019-11-04 MED ORDER — LAMOTRIGINE 25 MG PO TABS: 50.0000 mg | ORAL_TABLET | Freq: Every day | ORAL | 0 refills | Status: DC

## 2019-11-04 MED ORDER — PAROXETINE HCL 20 MG PO TABS: 20.0000 mg | ORAL_TABLET | Freq: Every day | ORAL | 0 refills | Status: DC

## 2019-11-04 MED ORDER — QUETIAPINE FUMARATE 100 MG PO TABS: 150.0000 mg | ORAL_TABLET | Freq: Every day | ORAL | 0 refills | Status: DC

## 2019-11-04 NOTE — Progress Notes (Signed)
[facility] MD OP Progress Note  I connected with  Miranda Harris on 11/04/19 by a video enabled telemedicine application and verified that I am speaking with the correct person using two identifiers.   I discussed the limitations of evaluation and management by telemedicine. The patient expressed understanding and agreed to proceed.    11/04/2019 2:22 PM Carmina Siemen  MRN:  [id number]  Chief Complaint: " I am doing good."  HPI: Patient reported that she is doing well on her current medication regimen.  She stated that everything is going well for her.  She denied any side effects to the combination.  She stated that she is sleeping well with Seroquel.  She hopes that Paxil continues to work for her as in the past some of the antidepressants stopped working for after a while.  She denies any acute issues or concerns at this time. She stated that she is happy to be off of the Lithium and feels she can think more clearly now.  Visit Diagnosis:    ICD-10-CM   1. Bipolar 1 disorder ([facility])  F31.9 lamoTRIgine (LAMICTAL) 25 MG tablet    PARoxetine (PAXIL) 20 MG tablet    QUEtiapine (SEROQUEL) 100 MG tablet    Past Psychiatric History: Bipolar d/o  Past Medical History:  Past Medical History:  Diagnosis Date  . Bipolar disorder ([place])   . Elevated serum creatinine    related to lithium use  . Helicobacter pylori (H. pylori) infection    pt denies  . Hypothyroidism   . Perirectal fistula   . Thyroid disease   . Trochanteric bursitis of right hip     Past Surgical History:  Procedure Laterality Date  . EVALUATION UNDER ANESTHESIA WITH HEMORRHOIDECTOMY N/A 06/28/2017   Procedure: EXAM UNDER ANESTHESIA WITH EXTERNAL HEMORRHOIDECTOMY AND REPAIR OF PERIRECTAL FISTULA;  Surgeon: Michael Boston, MD;  Location: [facility];  Service: General;  Laterality: N/A;  . HERNIA REPAIR  [id number] yrs ago   umbilical hernia  . INCISION AND DRAINAGE PERIRECTAL ABSCESS N/A 04/18/2017   Procedure:  IRRIGATION AND DEBRIDEMENT PERIRECTAL ABSCESS, placement of seton, sigmoidoscopy;  Surgeon: Excell Seltzer, MD;  [facility];  Service: General;  Laterality: N/A;  . LAPAROSCOPIC APPENDECTOMY    . VAGINAL HYSTERECTOMY Bilateral    complete  . WISDOM TOOTH EXTRACTION      Family Psychiatric History: see below  Family History:  Family History  Problem Relation Age of Onset  . Anxiety disorder Brother   . Breast cancer Neg Hx     Social History:  Social History   Socioeconomic History  . Marital status: Married    Spouse name: Not on file  . Number of children: Not on file  . Years of education: Not on file  . Highest education level: Not on file  Occupational History  . Not on file  Tobacco Use  . Smoking status: Former Smoker    Packs/day: 1.00    Years: 30.00    Pack years: 30.00    Types: Cigarettes  . Smokeless tobacco: Never Used  . Tobacco comment: quit 2008  Substance and Sexual Activity  . Alcohol use: No    Alcohol/week: 0.0 standard drinks    Comment: Occasional use  . Drug use: No  . Sexual activity: Yes    Partners: Male    Birth control/protection: Surgical  Other Topics Concern  . Not on file  Social History Narrative  . Not on file   Social Determinants  of Health   Financial Resource Strain:   . Difficulty of Paying Living Expenses: Not on file  Food Insecurity:   . Worried About Charity fundraiser in the Last Year: Not on file  . Ran Out of Food in the Last Year: Not on file  Transportation Needs:   . Lack of Transportation (Medical): Not on file  . Lack of Transportation (Non-Medical): Not on file  Physical Activity:   . Days of Exercise per Week: Not on file  . Minutes of Exercise per Session: Not on file  Stress:   . Feeling of Stress : Not on file  Social Connections:   . Frequency of Communication with Friends and Family: Not on file  . Frequency of Social Gatherings with Friends and Family: Not on file  . Attends  Religious Services: Not on file  . Active Member of Clubs or Organizations: Not on file  . Attends Archivist Meetings: Not on file  . Marital Status: Not on file    Allergies: No Known Allergies  Metabolic Disorder Labs: Lab Results  Component Value Date   HGBA1C 5.4 06/10/2015   MPG 108 06/10/2015   No results found for: PROLACTIN Lab Results  Component Value Date   CHOL 208 (H) 11/30/2018   TRIG 116.0 11/30/2018   HDL 55.00 11/30/2018   CHOLHDL 4 11/30/2018   VLDL 23.2 11/30/2018   LDLCALC 130 (H) 11/30/2018   Lab Results  Component Value Date   TSH 1.48 05/22/2019   TSH 3.04 11/06/2018    Therapeutic Level Labs: Lab Results  Component Value Date   LITHIUM 1.2 11/08/2018   LITHIUM 0.80 06/10/2015   No results found for: VALPROATE No components found for:  CBMZ  Current Medications: Current Outpatient Medications  Medication Sig Dispense Refill  . Calcium Carbonate-Vitamin D (CALTRATE 600+D PO) Take 2 tablets by mouth daily.    [name] lamoTRIgine (LAMICTAL) 25 MG tablet Take 2 tablets (50 mg total) by mouth daily. 180 tablet 0  . levothyroxine (SYNTHROID, LEVOTHROID) 150 MCG tablet Take 1 tablet (150 mcg total) by mouth daily. 90 tablet 3  . Multiple Vitamin (MULTIVITAMIN WITH MINERALS) TABS tablet Take 1 tablet by mouth daily.    . Omega-3 Fatty Acids (FISH OIL) 1000 MG CAPS Take 2,000 mg by mouth daily.    [name] PARoxetine (PAXIL) 20 MG tablet Take 1 tablet (20 mg total) by mouth daily. 90 tablet 0  . QUEtiapine (SEROQUEL) 100 MG tablet Take 1.5 tablets (150 mg total) by mouth at bedtime. 135 tablet 0   No current facility-administered medications for this visit.     Musculoskeletal: Strength & Muscle Tone: unable to assess due to telemed visit Gait & Station: unable to assess due to telemed visit Patient leans: unable to assess due to telemed visit     Psychiatric Specialty Exam: Review of Systems  There were no vitals taken for this visit.There  is no height or weight on file to calculate BMI.  General Appearance: Well Groomed  Eye Contact:  Good  Speech:  Clear and Coherent and Normal Rate  Volume:  Normal  Mood:  Euthymic  Affect:  Congruent  Thought Process:  Goal Directed, Linear and Descriptions of Associations: Intact  Orientation:  Full (Time, Place, and Person)  Thought Content: Logical   Suicidal Thoughts:  No  Homicidal Thoughts:  No  Memory:  Recent;   Good Remote;   Good  Judgement:  Good  Insight:  Good  Psychomotor  Activity:  Normal  Concentration:  Concentration: Good and Attention Span: Good  Recall:  Good  Fund of Knowledge: Good  Language: Good  Akathisia:  Negative  Handed:  Right  AIMS (if indicated): not done  Assets:  Communication Skills Desire for Improvement Financial Resources/Insurance Housing  ADL's:  Intact  Cognition: WNL  Sleep:  Good   Screenings: PHQ2-9     Office Visit from 11/08/2018 in [facility] at [facility] Total Score  0       Assessment and Plan: 59 y.o. year old female with a history of bipolar I disorder,lithium nephropathy (seen by nephrologist),anxiety, alcohol use disorder in sustained remission, hypothyroidism  , who was seen for follow up. Pt appears to be stable on her current regimen.  1. Bipolar 1 disorder ([facility])  - lamoTRIgine (LAMICTAL) 25 MG tablet; Take 2 tablets (50 mg total) by mouth daily.  Dispense: 180 tablet; Refill: 0 - PARoxetine (PAXIL) 20 MG tablet; Take 1 tablet (20 mg total) by mouth daily.  Dispense: 90 tablet; Refill: 0 - QUEtiapine (SEROQUEL) 100 MG tablet; Take 1.5 tablets (150 mg total) by mouth at bedtime.  Dispense: 135 tablet; Refill: 0  Continue same medication regimen. Follow up in 3 months.    N[place]ada Crane, MD 11/04/2019, 2:22 PM

## 2019-11-07 DIAGNOSIS — H2511 Age-related nuclear cataract, right eye: Secondary | ICD-10-CM | POA: Diagnosis not present

## 2019-11-07 IMAGING — MG DIGITAL SCREENING BILATERAL MAMMOGRAM WITH TOMO AND CAD
8 series · 8 of 24 positions shown · non-contrast
Comparison: Previous exam(s).

ACR Breast Density Category a: The breast tissue is almost entirely
fatty.

CLINICAL DATA: Screening.

EXAM:
DIGITAL SCREENING BILATERAL MAMMOGRAM WITH TOMO AND CAD

[R MLO synth-2D]
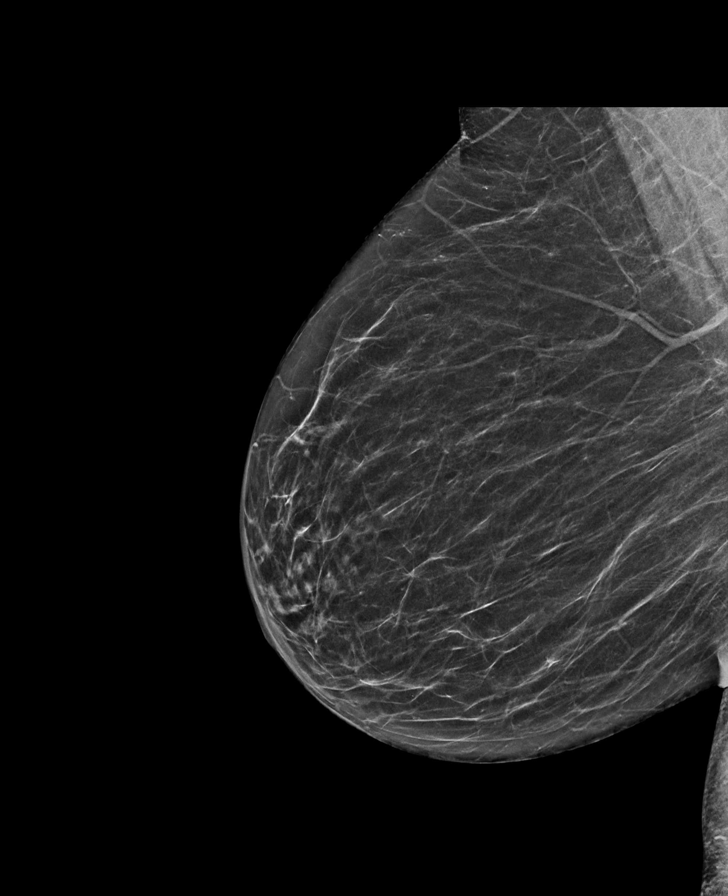

[L CC synth-2D]
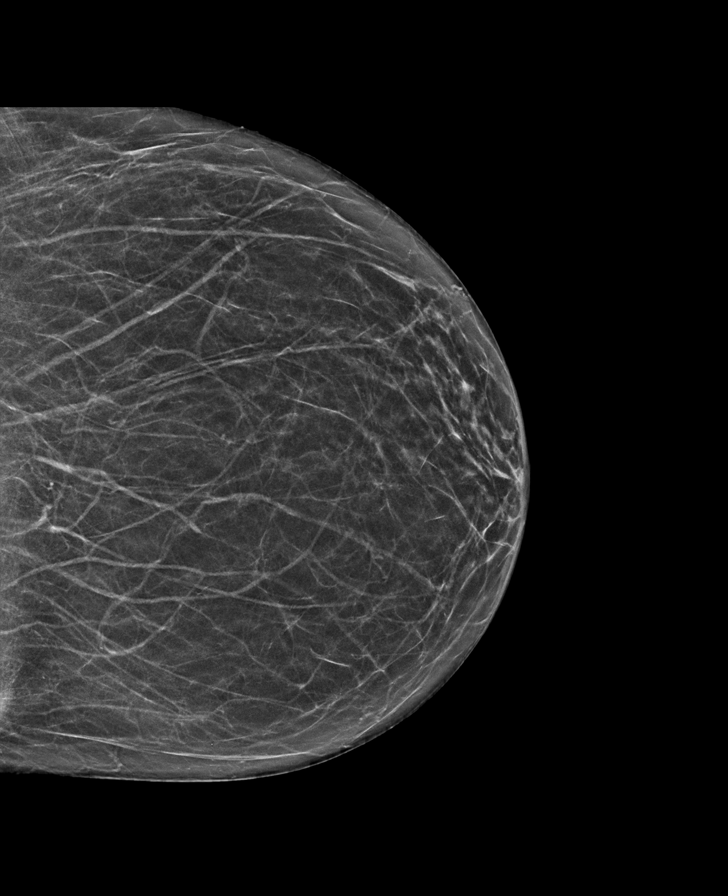

[L MLO synth-2D]
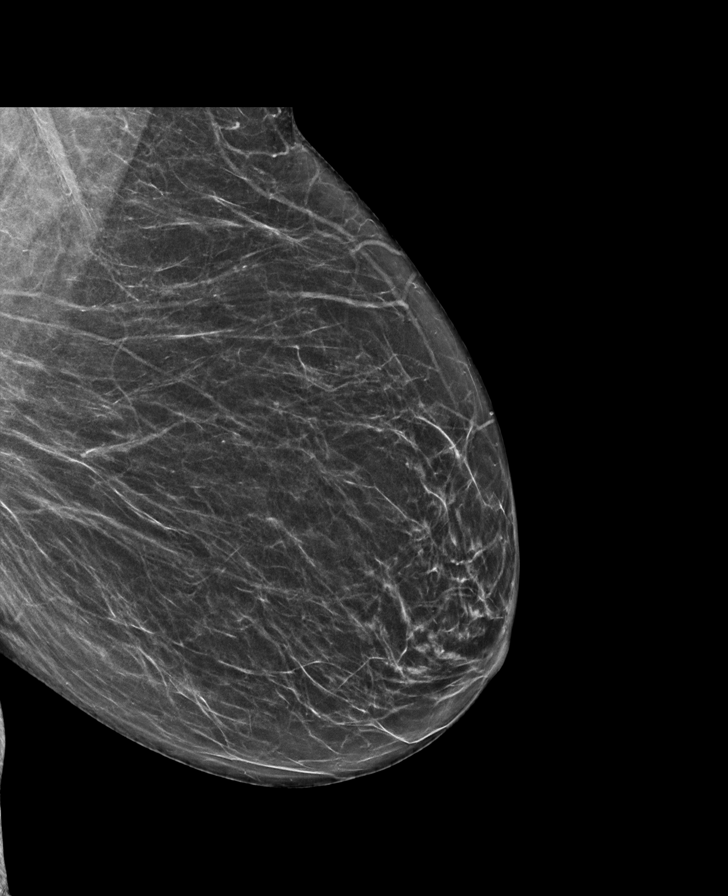

[R CC synth-2D]
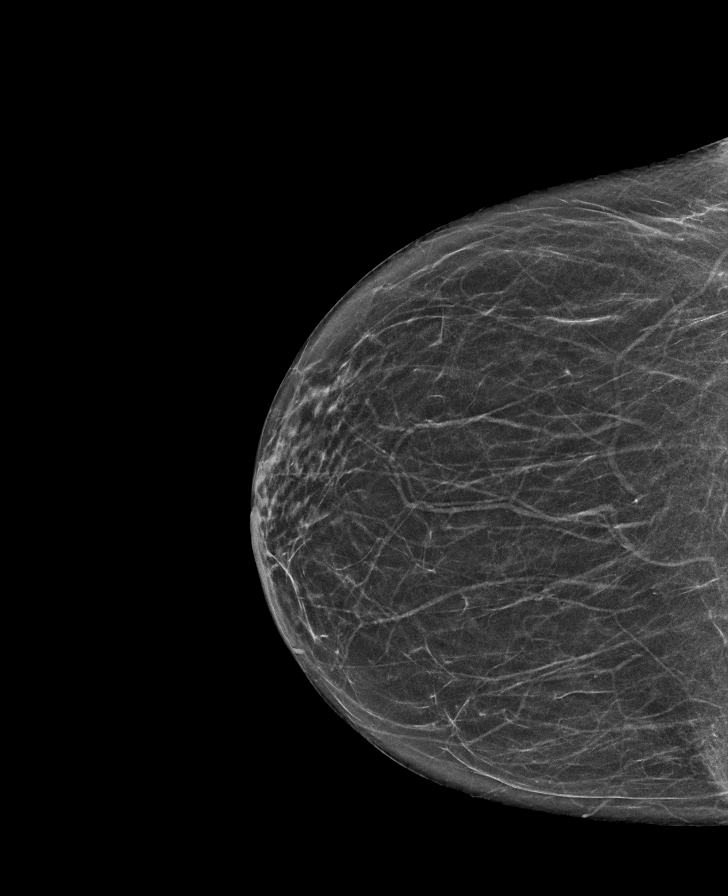

[L MLO tomo · tomo slice 32/63.0]
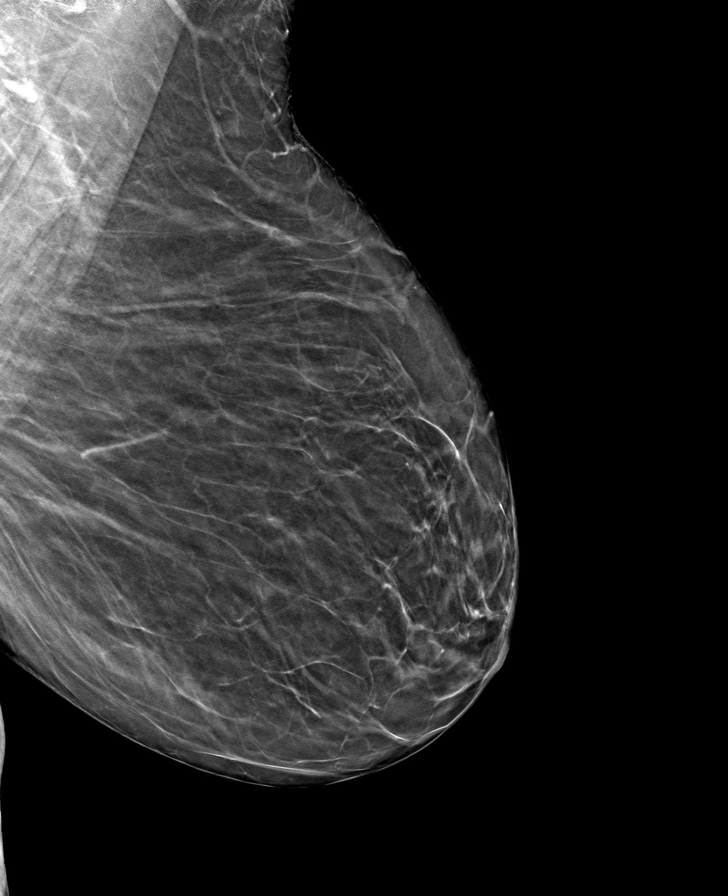

[R MLO tomo · tomo slice 31/60.0]
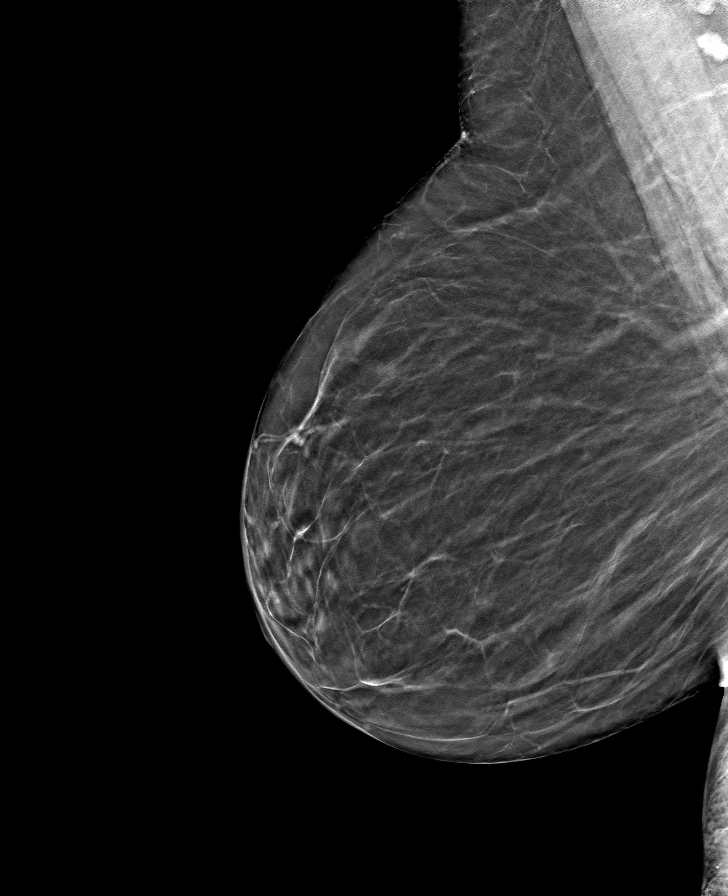

[R CC tomo · tomo slice 29/56.0]
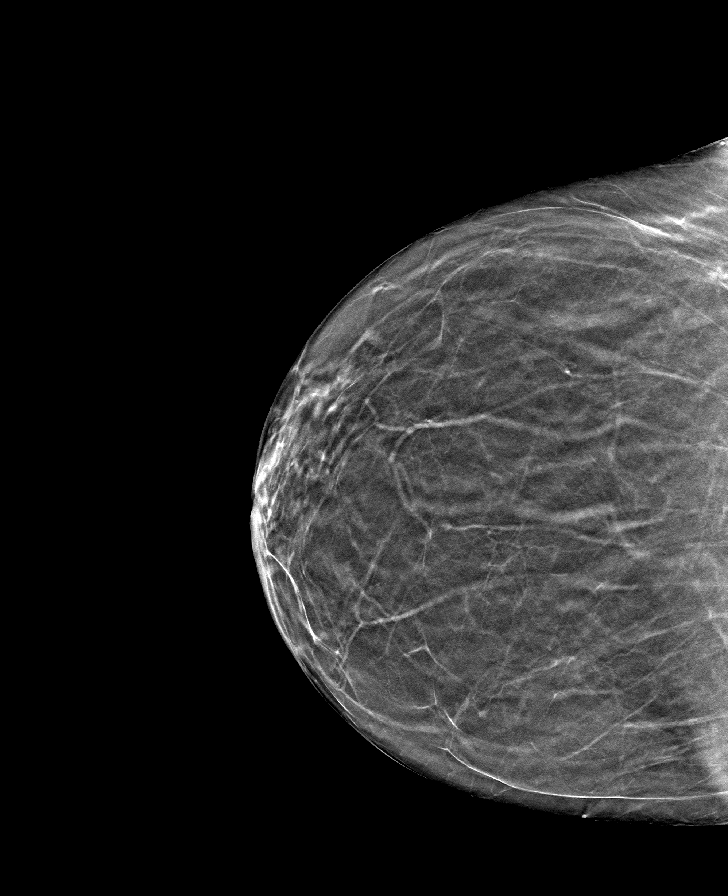

[L CC tomo · tomo slice 28/55.0]
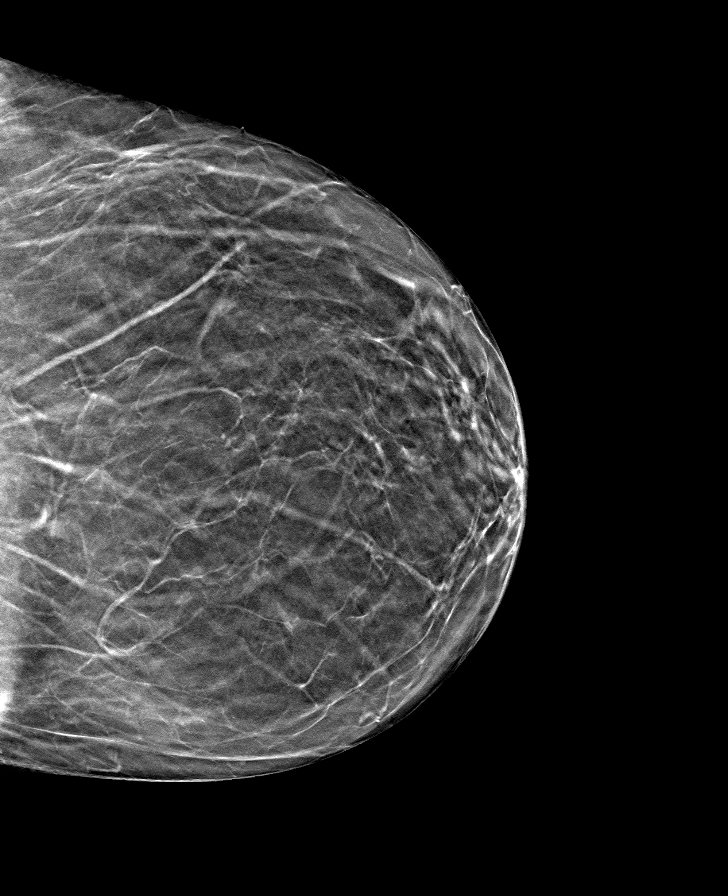

[8 of 24 positions shown; findings below may reference images not displayed]

FINDINGS: There are no findings suspicious for malignancy. Images were
processed with CAD.
IMPRESSION: No mammographic evidence of malignancy. A result letter of this
screening mammogram will be mailed directly to the patient.

RECOMMENDATION:
Screening mammogram in one year. (Code:[id number])

BI-RADS CATEGORY  1: Negative.

## 2019-11-14 ENCOUNTER — Other Ambulatory Visit: Payer: Self-pay

## 2019-11-14 ENCOUNTER — Telehealth: Payer: Medicare Other | Admitting: Family

## 2019-11-14 ENCOUNTER — Telehealth (INDEPENDENT_AMBULATORY_CARE_PROVIDER_SITE_OTHER): Payer: 59 | Admitting: Family Medicine

## 2019-11-14 DIAGNOSIS — H9202 Otalgia, left ear: Secondary | ICD-10-CM

## 2019-11-14 DIAGNOSIS — J3489 Other specified disorders of nose and nasal sinuses: Secondary | ICD-10-CM

## 2019-11-14 MED ORDER — DOXYCYCLINE HYCLATE 100 MG PO TABS: 100.0000 mg | ORAL_TABLET | Freq: Two times a day (BID) | ORAL | 0 refills | Status: DC

## 2019-11-14 NOTE — Patient Instructions (Signed)
-  I sent the medication(s) we discussed to your pharmacy: Meds ordered this encounter  Medications  . doxycycline (VIBRA-TABS) 100 MG tablet    Sig: Take 1 tablet (100 mg total) by mouth 2 (two) times daily.    Dispense:  20 tablet    Refill:  0    Please let us know if you have any questions or concerns regarding this prescription.  I hope you are feeling better soon! Seek care promptly if your symptoms worsen, new concerns arise or you are not improving with treatment of the next 24 - 48 hours.

## 2019-11-14 NOTE — Progress Notes (Signed)
Virtual Visit via Video Note  I connected with Miranda Harris  on 11/14/19 at  3:20 PM EST by a video enabled telemedicine application and verified that I am speaking with the correct person using two identifiers.  Location patient: home Location provider:work or home office Persons participating in the virtual visit: patient, provider  I discussed the limitations of evaluation and management by telemedicine and the availability of in person appointments. The patient expressed understanding and agreed to proceed.   HPI:  Acute visit for "sinus infection": -symptoms started: 5 days ago -symptoms include: L maxillary sinus pain, L ear pain -had sinus infection in the past several years ago and was really bad because she did not get it treated - reports this feels the same and an antibiotic fixed it last time, reports the abx was doxycycline -saw her eye doctor yesterday as also has pain behind the eyes and he said nothing wrong with her  -denies: cough, SOB, fever, sick contacts, white or thick sinus congestion, chronic nasal congestion or PND, loss of taste of smell, vision changes, speech changes, neuro deficits, known sick exposures    ROS: See pertinent positives and negatives per HPI.  Past Medical History:  Diagnosis Date  . Bipolar disorder ([place])   . Elevated serum creatinine    related to lithium use  . Helicobacter pylori (H. pylori) infection    pt denies  . Hypothyroidism   . Perirectal fistula   . Thyroid disease   . Trochanteric bursitis of right hip     Past Surgical History:  Procedure Laterality Date  . EVALUATION UNDER ANESTHESIA WITH HEMORRHOIDECTOMY N/A 06/28/2017   Procedure: EXAM UNDER ANESTHESIA WITH EXTERNAL HEMORRHOIDECTOMY AND REPAIR OF PERIRECTAL FISTULA;  Surgeon: Michael Boston, MD;  Location: [facility];  Service: General;  Laterality: N/A;  . HERNIA REPAIR  [id number] yrs ago   umbilical hernia  . INCISION AND DRAINAGE PERIRECTAL ABSCESS N/A  04/18/2017   Procedure: IRRIGATION AND DEBRIDEMENT PERIRECTAL ABSCESS, placement of seton, sigmoidoscopy;  Surgeon: Excell Seltzer, MD;  Location: [facility];  Service: General;  Laterality: N/A;  . LAPAROSCOPIC APPENDECTOMY    . VAGINAL HYSTERECTOMY Bilateral    complete  . WISDOM TOOTH EXTRACTION      Family History  Problem Relation Age of Onset  . Anxiety disorder Brother   . Breast cancer Neg Hx        Current Outpatient Medications:  .  Calcium Carbonate-Vitamin D (CALTRATE 600+D PO), Take 2 tablets by mouth daily., Disp: , Rfl:  .  doxycycline (VIBRA-TABS) 100 MG tablet, Take 1 tablet (100 mg total) by mouth 2 (two) times daily., Disp: 20 tablet, Rfl: 0 .  lamoTRIgine (LAMICTAL) 25 MG tablet, Take 2 tablets (50 mg total) by mouth daily., Disp: 180 tablet, Rfl: 0 .  levothyroxine (SYNTHROID, LEVOTHROID) 150 MCG tablet, Take 1 tablet (150 mcg total) by mouth daily., Disp: 90 tablet, Rfl: 3 .  Multiple Vitamin (MULTIVITAMIN WITH MINERALS) TABS tablet, Take 1 tablet by mouth daily., Disp: , Rfl:  .  Omega-3 Fatty Acids (FISH OIL) 1000 MG CAPS, Take 2,000 mg by mouth daily., Disp: , Rfl:  .  PARoxetine (PAXIL) 20 MG tablet, Take 1 tablet (20 mg total) by mouth daily., Disp: 90 tablet, Rfl: 0 .  QUEtiapine (SEROQUEL) 100 MG tablet, Take 1.5 tablets (150 mg total) by mouth at bedtime., Disp: 135 tablet, Rfl: 0  EXAM:  VITALS per patient if applicable:  GENERAL: alert, oriented, appears well and in no  acute distress  HEENT: atraumatic, conjunttiva clear, no obvious abnormalities on inspection of external nose and ears  NECK: normal movements of the head and neck  LUNGS: on inspection no signs of respiratory distress, breathing rate appears normal, no obvious gross SOB, gasping or wheezing  CV: no obvious cyanosis  MS: moves all visible extremities without noticeable abnormality  PSYCH/NEURO: pleasant and cooperative, no obvious depression or anxiety, speech and thought  processing grossly intact, PER, no appreciable gross facial asymmetry   ASSESSMENT AND PLAN:  Discussed the following assessment and plan:  Sinus pain  Discomfort of left ear  -we discussed possible serious and likely etiologies, options for evaluation and workup, limitations of telemedicine visit vs in person visit, treatment, treatment risks and precautions. Pt prefers to treat via telemedicine empirically rather then risking or undertaking an in person visit at this moment. Discussed numerous causes of facial pain. She feels certain this is a sinus infection and strongly prefers to try empiric treatment with doxycycline. Discussed interactions with medications. Rx sent. Patient agrees to seek prompt in person care if worsening, new symptoms arise, or if is not improving with treatment over the next 24 - 48 hours.   I discussed the assessment and treatment plan with the patient. The patient was provided an opportunity to ask questions and all were answered. The patient agreed with the plan and demonstrated an understanding of the instructions.   The patient was advised to call back or seek an in-person evaluation if the symptoms worsen or if the condition fails to improve as anticipated.   Miranda Kern, DO

## 2019-11-28 ENCOUNTER — Other Ambulatory Visit: Payer: Self-pay

## 2019-11-29 ENCOUNTER — Encounter: Payer: Self-pay | Admitting: Family Medicine

## 2019-11-29 ENCOUNTER — Ambulatory Visit (INDEPENDENT_AMBULATORY_CARE_PROVIDER_SITE_OTHER): Payer: 59 | Admitting: Family Medicine

## 2019-11-29 VITALS — BP 134/80 | HR 76 | Temp 96.6°F | Resp 12 | Ht 68.0 in | Wt 193.0 lb

## 2019-11-29 DIAGNOSIS — R519 Headache, unspecified: Secondary | ICD-10-CM | POA: Diagnosis not present

## 2019-11-29 DIAGNOSIS — N183 Chronic kidney disease, stage 3 unspecified: Secondary | ICD-10-CM | POA: Diagnosis not present

## 2019-11-29 DIAGNOSIS — E039 Hypothyroidism, unspecified: Secondary | ICD-10-CM

## 2019-11-29 LAB — CBC WITH DIFFERENTIAL/PLATELET
Basophils Absolute: 0.1 10*3/uL (ref 0.0–0.1)
Basophils Relative: 0.9 % (ref 0.0–3.0)
Eosinophils Absolute: 0.1 10*3/uL (ref 0.0–0.7)
Eosinophils Relative: 2 % (ref 0.0–5.0)
HCT: 38.7 % (ref 36.0–46.0)
Hemoglobin: 13 g/dL (ref 12.0–15.0)
Lymphocytes Relative: 30.5 % (ref 12.0–46.0)
Lymphs Abs: 1.7 10*3/uL (ref 0.7–4.0)
MCHC: 33.7 g/dL (ref 30.0–36.0)
MCV: 92.6 fl (ref 78.0–100.0)
Monocytes Absolute: 0.4 10*3/uL (ref 0.1–1.0)
Monocytes Relative: 7.2 % (ref 3.0–12.0)
Neutro Abs: 3.4 10*3/uL (ref 1.4–7.7)
Neutrophils Relative %: 59.4 % (ref 43.0–77.0)
Platelets: 261 10*3/uL (ref 150.0–400.0)
RBC: 4.18 Mil/uL (ref 3.87–5.11)
RDW: 13.2 % (ref 11.5–15.5)
WBC: 5.6 10*3/uL (ref 4.0–10.5)

## 2019-11-29 LAB — BASIC METABOLIC PANEL
BUN: 25 mg/dL — ABNORMAL HIGH (ref 6–23)
CO2: 26 mEq/L (ref 19–32)
Calcium: 9.7 mg/dL (ref 8.4–10.5)
Chloride: 107 mEq/L (ref 96–112)
Creatinine, Ser: 1.21 mg/dL — ABNORMAL HIGH (ref 0.40–1.20)
GFR: 45.39 mL/min — ABNORMAL LOW (ref >60.00)
Glucose, Bld: 62 mg/dL — ABNORMAL LOW (ref 70–99)
Potassium: 4.4 mEq/L (ref 3.5–5.1)
Sodium: 140 mEq/L (ref 135–145)

## 2019-11-29 LAB — TSH: TSH: 1.94 u[IU]/mL (ref 0.35–4.50)

## 2019-11-29 LAB — C-REACTIVE PROTEIN: CRP: <1 mg/dL (ref 0.5–20.0)

## 2019-11-29 LAB — MICROALBUMIN / CREATININE URINE RATIO
Creatinine,U: 30.8 mg/dL
Microalb Creat Ratio: 3.4 mg/g (ref 0.0–30.0)
Microalb, Ur: 1 mg/dL (ref 0.0–1.9)

## 2019-11-29 LAB — SEDIMENTATION RATE: Sed Rate: 13 mm/hr (ref 0–30)

## 2019-11-29 MED ORDER — PREDNISONE 20 MG PO TABS: ORAL_TABLET | ORAL | 0 refills | Status: DC

## 2019-11-29 NOTE — Patient Instructions (Signed)
A few things to remember from today's visit:   Stage 3 chronic kidney disease, unspecified whether stage 3a or 3b CKD - Plan: Basic metabolic panel, Microalbumin / creatinine urine ratio  Adult hypothyroidism - Plan: TSH  Left facial pain - Plan: predniSONE (DELTASONE) 20 MG tablet, CBC with Differential/Platelet, C-reactive protein, Sedimentation rate  Avoid eating chewing or crunchy food. Take prednisone with breakfast. Monitor for new symptoms. Please follow-up with your PCP in 2 weeks, before if needed.  Please be sure medication list is accurate. If a new problem present, please set up appointment sooner than planned today.

## 2019-11-29 NOTE — Progress Notes (Signed)
ACUTE VISIT   HPI:  Chief Complaint  Patient presents with  . Ear Pain    Miranda Harris is a 59 y.o. female with hx of bipolar, hypothyroidism, CKD 3, and bipolar disorder who is here today complaining of at least 2 weels of left ear,maxilar, and retro-ocular pain associated with photophobia. She remembers having similar symptoms (earache and TMJ soreness) about 15 years ago, diagnosed with TMJ, problem resolved after prednisone taper.  Left temporal headache and retro-ocular pain and new symptoms.  Pain is dull, intermittent, 6-7/10, yesterday pain was worse. No associated N/V,numbness, tingling, or focal deficit. She has not noted a skin rash. Tylenol at 1000 mg does not help. Problem is stable.  She has not identified exacerbating or alleviating factors. It seems to be worse at the end of the day.  Negative for history of migraine headaches.  Negative for visual changes, conjunctival erythema, epiphora, recent URI, nasal congestion, rhinorrhea, sore throat, or oral lesions.  She was seen on 11/14/2019, doxycycline was recommended, she did not notice any difference with antibiotic treatment.  Left TMJ pain with full mouth opening, no pain elicited by chewing. She recently got a dental guard, it has not changed pain intensity.   She also would like to have renal function check. She has not noted decreased urine output, foamy urine, or gross hematuria. No history of hypertension or diabetes. She does not take NSAIDs chronically.  Component     Latest Ref Rng & Units 05/22/2019  Sodium     135 - 145 mEq/L 138  Potassium     3.5 - 5.1 mEq/L 4.4  Chloride     96 - 112 mEq/L 106  CO2     19 - 32 mEq/L 24  Glucose     70 - 99 mg/dL 81  BUN     6 - 23 mg/dL 27 (H)  Creatinine     0.40 - 1.20 mg/dL 1.19  Calcium     8.4 - 10.5 mg/dL 9.9  GFR     >60.00 mL/min 46.36 (L)   Hypothyroidism: According to patient her medication was adjusted a few months  ago. She has not noted abnormal weight loss, palpitation, or tremor. She is on levothyroxine 150 mcg daily.  Last TSH normal at 1.48 (05/22/2019).  Review of Systems  Constitutional: Positive for fatigue. Negative for activity change, appetite change and fever.  HENT: Negative for nosebleeds and trouble swallowing.   Respiratory: Negative for cough, shortness of breath and wheezing.   Cardiovascular: Negative for chest pain, palpitations and leg swelling.  Gastrointestinal: Negative for abdominal pain.       Negative for changes in bowel habits.  Musculoskeletal: Negative for arthralgias and gait problem.  Allergic/Immunologic: Negative for environmental allergies.  Neurological: Positive for headaches. Negative for dizziness and syncope.  Rest see pertinent positives and negatives per HPI.  Current Outpatient Medications on File Prior to Visit  Medication Sig Dispense Refill  . Calcium Carbonate-Vitamin D (CALTRATE 600+D PO) Take 2 tablets by mouth daily.    [name] lamoTRIgine (LAMICTAL) 25 MG tablet Take 2 tablets (50 mg total) by mouth daily. 180 tablet 0  . levothyroxine (SYNTHROID, LEVOTHROID) 150 MCG tablet Take 1 tablet (150 mcg total) by mouth daily. 90 tablet 3  . Multiple Vitamin (MULTIVITAMIN WITH MINERALS) TABS tablet Take 1 tablet by mouth daily.    . Omega-3 Fatty Acids (FISH OIL) 1000 MG CAPS Take 2,000 mg by mouth daily.    [name]  PARoxetine (PAXIL) 20 MG tablet Take 1 tablet (20 mg total) by mouth daily. 90 tablet 0  . QUEtiapine (SEROQUEL) 100 MG tablet Take 1.5 tablets (150 mg total) by mouth at bedtime. 135 tablet 0   No current facility-administered medications on file prior to visit.   Past Medical History:  Diagnosis Date  . Bipolar disorder ([place])   . Elevated serum creatinine    related to lithium use  . Helicobacter pylori (H. pylori) infection    pt denies  . Hypothyroidism   . Perirectal fistula   . Thyroid disease   . Trochanteric bursitis of right hip    No  Known Allergies  Social History   Socioeconomic History  . Marital status: Married    Spouse name: Not on file  . Number of children: Not on file  . Years of education: Not on file  . Highest education level: Not on file  Occupational History  . Not on file  Tobacco Use  . Smoking status: Former Smoker    Packs/day: 1.00    Years: 30.00    Pack years: 30.00    Types: Cigarettes  . Smokeless tobacco: Never Used  . Tobacco comment: quit 2008  Substance and Sexual Activity  . Alcohol use: No    Alcohol/week: 0.0 standard drinks    Comment: Occasional use  . Drug use: No  . Sexual activity: Yes    Partners: Male    Birth control/protection: Surgical  Other Topics Concern  . Not on file  Social History Narrative  . Not on file   Social Determinants of Health   Financial Resource Strain:   . Difficulty of Paying Living Expenses: Not on file  Food Insecurity:   . Worried About Charity fundraiser in the Last Year: Not on file  . Ran Out of Food in the Last Year: Not on file  Transportation Needs:   . Lack of Transportation (Medical): Not on file  . Lack of Transportation (Non-Medical): Not on file  Physical Activity:   . Days of Exercise per Week: Not on file  . Minutes of Exercise per Session: Not on file  Stress:   . Feeling of Stress : Not on file  Social Connections:   . Frequency of Communication with Friends and Family: Not on file  . Frequency of Social Gatherings with Friends and Family: Not on file  . Attends Religious Services: Not on file  . Active Member of Clubs or Organizations: Not on file  . Attends Archivist Meetings: Not on file  . Marital Status: Not on file    Vitals:   11/29/19 0823  BP: 134/80  Pulse: 76  Resp: 12  Temp: (!) 96.6 F (35.9 C)  SpO2: 97%   Body mass index is 29.35 kg/m.  Physical Exam  Nursing note and vitals reviewed. Constitutional: She is oriented to person, place, and time. She appears well-developed.  She does not appear ill. No distress.  HENT:  Head: Atraumatic.  Right Ear: Tympanic membrane, external ear and ear canal normal. No tenderness.  Left Ear: Tympanic membrane, external ear and ear canal normal. No tenderness.  Nose: Right sinus exhibits no maxillary sinus tenderness and no frontal sinus tenderness. Left sinus exhibits no maxillary sinus tenderness (not sure if pain is worse with pressure.) and no frontal sinus tenderness.  Mouth/Throat: Oropharynx is clear and moist and mucous membranes are normal.  Left TMJ pain with movement,not limited. Slight crepitus. No edema or  erythema.  Eyes: Pupils are equal, round, and reactive to light. Conjunctivae and EOM are normal.  Cardiovascular: Normal rate and regular rhythm.  No murmur heard. Respiratory: Effort normal and breath sounds normal. No respiratory distress.  Musculoskeletal:        General: No edema.  Lymphadenopathy:       Head (right side): No submandibular, no preauricular and no posterior auricular adenopathy present.       Head (left side): No submandibular, no preauricular and no posterior auricular adenopathy present.    She has no cervical adenopathy.  Neurological: She is alert and oriented to person, place, and time. She has normal strength. No cranial nerve deficit. Gait normal.  Reflex Scores:      Bicep reflexes are 2+ on the right side and 2+ on the left side.      Patellar reflexes are 2+ on the right side and 2+ on the left side. Skin: Skin is warm. No rash noted. No erythema.  Psychiatric: Her mood appears anxious.  She seems looks tired, good eye contact.   ASSESSMENT AND PLAN:  Miranda Harris was seen today for ear pain.  Diagnoses and all orders for this visit:  Lab Results  Component Value Date   ESRSEDRATE 13 11/29/2019   Lab Results  Component Value Date   CRP <1.0 11/29/2019    Lab Results  Component Value Date   TSH 1.94 11/29/2019   Lab Results  Component Value Date   CREATININE 1.21  (H) 11/29/2019   BUN 25 (H) 11/29/2019   NA 140 11/29/2019   K 4.4 11/29/2019   CL 107 11/29/2019   CO2 26 11/29/2019   Lab Results  Component Value Date   MICROALBUR 1.0 11/29/2019    Lab Results  Component Value Date   WBC 5.6 11/29/2019   HGB 13.0 11/29/2019   HCT 38.7 11/29/2019   MCV 92.6 11/29/2019   PLT 261.0 11/29/2019    Left facial pain We discussed possible etiologies:? Migraine headache, neuropathy pain, sinus disorder, inflammatory process, and TMJ ambulance home. Antibiotic treatment did not help with pain. There is mild tenderness of left TMJ, this could be an aggravating factor.  Neurologic evaluation is normal today, so I am feeling his imaging is needed at this time but may need to be considered if problem does not resolve. She has tolerated prednisone in the past, we discussed some side effects,including psychiatric.  Instructed about warning signs. Further recommendation will be given according to lab results. Recommend follow-up with PCP in 2 weeks, before if needed.  -     predniSONE (DELTASONE) 20 MG tablet; 2 tabs for 5 days, 1 tabs for 3 days, 1/2 tabs for 3 days. Take tables together with breakfast. -     CBC with Differential/Platelet -     C-reactive protein -     Sedimentation rate  Stage 3 chronic kidney disease, unspecified whether stage 3a or 3b CKD Problem seems to be stable. Cr 1.2-1.3 and e GFR mid 40's She knows to avoid NSAIDs and to follow a low-salt diet. Adequate hydration.  Adult hypothyroidism No changes in current management. Levothyroxine dose will be adjusted, if needed, according to TSH result.   Return in about 2 weeks (around 12/13/2019) for 10-14 days for facial pain.[name]   Deshan Hemmelgarn G. Martin[place], MD  [facility]. [place] office.

## 2019-12-06 ENCOUNTER — Encounter: Payer: Self-pay | Admitting: Family Medicine

## 2019-12-06 ENCOUNTER — Telehealth (INDEPENDENT_AMBULATORY_CARE_PROVIDER_SITE_OTHER): Payer: 59 | Admitting: Family Medicine

## 2019-12-06 DIAGNOSIS — R739 Hyperglycemia, unspecified: Secondary | ICD-10-CM

## 2019-12-06 DIAGNOSIS — N1832 Chronic kidney disease, stage 3b: Secondary | ICD-10-CM | POA: Diagnosis not present

## 2019-12-06 DIAGNOSIS — R350 Frequency of micturition: Secondary | ICD-10-CM

## 2019-12-06 DIAGNOSIS — G4459 Other complicated headache syndrome: Secondary | ICD-10-CM | POA: Diagnosis not present

## 2019-12-06 NOTE — Progress Notes (Signed)
Virtual Visit via Video Note   I connected with Miranda Harris on 12/06/19 by a video enabled telemedicine application and verified that I am speaking with the correct person using two identifiers.  Location patient: home Location provider:work office Persons participating in the virtual visit: patient, provider  I discussed the limitations of evaluation and management by telemedicine and the availability of in person appointments. The patient expressed understanding and agreed to proceed.   HPI: Miranda. Harris is a 59 yo female seen 7 days ago because left-sided retro-ocular,face,and earache. Prednisonetaper was recommended. Pain has improved some but not resolved. Pain started about 3 weeks ago.  Dull pain 5/10. Negative for fever,chills,visual changes,focal weakness,tingling,numbness,nasal congestion,sore throat,oral lesions,or skin rash. Left earache,TMJ pain. No limitation of mouth ROM.  Tylenol does not help much.  No new associated symptom. She has left eye cataract surgery 11/11/19. Prednisone is causing urinary frequency and interfering with sleep. She is waking up several times through the nigh,mainly because the urge to urinate. She still has not identified exacerbating or alleviating factors.  She wonders if lack of sleep is causing persistent pain. She takes Seroquel 150 mg at bedtime.  Negative for pelvic pain, dysuria, gross hematuria, or decreased urine output.  CKD 3, states that she saw nephrology 1.5 years ago and was instructed to follow-up as needed. She is trying to decrease salt intake and has not taking NSAIDs since nephrology evaluation.  Lab Results  Component Value Date   CREATININE 1.21 (H) 11/29/2019   BUN 25 (H) 11/29/2019   NA 140 11/29/2019   K 4.4 11/29/2019   CL 107 11/29/2019   CO2 26 11/29/2019   Lab Results  Component Value Date   WBC 5.6 11/29/2019   HGB 13.0 11/29/2019   HCT 38.7 11/29/2019   MCV 92.6 11/29/2019   PLT 261.0 11/29/2019    Lab Results  Component Value Date   CRP <1.0 11/29/2019   Lab Results  Component Value Date   ESRSEDRATE 13 11/29/2019    She checked BS with husband's glucometer and BS 4 hours after eating was 165. No hx of diabetes. Negative for increase thirst or hunger. Denies abdominal pain, nausea,or vomiting..  ROS: See pertinent positives and negatives per HPI.  Past Medical History:  Diagnosis Date  . Bipolar disorder ([place])   . Elevated serum creatinine    related to lithium use  . Helicobacter pylori (H. pylori) infection    pt denies  . Hypothyroidism   . Perirectal fistula   . Thyroid disease   . Trochanteric bursitis of right hip    Past Surgical History:  Procedure Laterality Date  . EVALUATION UNDER ANESTHESIA WITH HEMORRHOIDECTOMY N/A 06/28/2017   Procedure: EXAM UNDER ANESTHESIA WITH EXTERNAL HEMORRHOIDECTOMY AND REPAIR OF PERIRECTAL FISTULA;  Surgeon: Michael Boston, MD;  Location: [facility];  Service: General;  Laterality: N/A;  . HERNIA REPAIR  [id number] yrs ago   umbilical hernia  . INCISION AND DRAINAGE PERIRECTAL ABSCESS N/A 04/18/2017   Procedure: IRRIGATION AND DEBRIDEMENT PERIRECTAL ABSCESS, placement of seton, sigmoidoscopy;  Surgeon: Excell Seltzer, MD;  Location: [facility];  Service: General;  Laterality: N/A;  . LAPAROSCOPIC APPENDECTOMY    . VAGINAL HYSTERECTOMY Bilateral    complete  . WISDOM TOOTH EXTRACTION      Family History  Problem Relation Age of Onset  . Anxiety disorder Brother   . Breast cancer Neg Hx     Social History   Socioeconomic History  . Marital status: Married  Spouse name: Not on file  . Number of children: Not on file  . Years of education: Not on file  . Highest education level: Not on file  Occupational History  . Not on file  Tobacco Use  . Smoking status: Former Smoker    Packs/day: 1.00    Years: 30.00    Pack years: 30.00    Types: Cigarettes  . Smokeless tobacco: Never Used  . Tobacco  comment: quit 2008  Substance and Sexual Activity  . Alcohol use: No    Alcohol/week: 0.0 standard drinks    Comment: Occasional use  . Drug use: No  . Sexual activity: Yes    Partners: Male    Birth control/protection: Surgical  Other Topics Concern  . Not on file  Social History Narrative  . Not on file   Social Determinants of Health   Financial Resource Strain:   . Difficulty of Paying Living Expenses:   Food Insecurity:   . Worried About Charity fundraiser in the Last Year:   . Arboriculturist in the Last Year:   Transportation Needs:   . Film/video editor (Medical):   [name] Lack of Transportation (Non-Medical):   Physical Activity:   . Days of Exercise per Week:   . Minutes of Exercise per Session:   Stress:   . Feeling of Stress :   Social Connections:   . Frequency of Communication with Friends and Family:   . Frequency of Social Gatherings with Friends and Family:   . Attends Religious Services:   . Active Member of Clubs or Organizations:   . Attends Archivist Meetings:   [name] Marital Status:   Intimate Partner Violence:   . Fear of Current or Ex-Partner:   . Emotionally Abused:   [name] Physically Abused:   . Sexually Abused:      Current Outpatient Medications:  .  Calcium Carbonate-Vitamin D (CALTRATE 600+D PO), Take 2 tablets by mouth daily., Disp: , Rfl:  .  lamoTRIgine (LAMICTAL) 25 MG tablet, Take 2 tablets (50 mg total) by mouth daily., Disp: 180 tablet, Rfl: 0 .  levothyroxine (SYNTHROID, LEVOTHROID) 150 MCG tablet, Take 1 tablet (150 mcg total) by mouth daily., Disp: 90 tablet, Rfl: 3 .  Multiple Vitamin (MULTIVITAMIN WITH MINERALS) TABS tablet, Take 1 tablet by mouth daily., Disp: , Rfl:  .  Omega-3 Fatty Acids (FISH OIL) 1000 MG CAPS, Take 2,000 mg by mouth daily., Disp: , Rfl:  .  PARoxetine (PAXIL) 20 MG tablet, Take 1 tablet (20 mg total) by mouth daily., Disp: 90 tablet, Rfl: 0 .  QUEtiapine (SEROQUEL) 100 MG tablet, Take 1.5 tablets  (150 mg total) by mouth at bedtime., Disp: 135 tablet, Rfl: 0  EXAM:  VITALS per patient if applicable:N/A  GENERAL: alert, oriented, appears well and in no acute distress  HEENT: atraumatic, conjunctiva clear, no obvious abnormalities on inspection of external nose and ears  NECK: normal movements of the head and neck  LUNGS: on inspection no signs of respiratory distress, breathing rate appears normal, no obvious gross SOB, gasping or wheezing  CV: no obvious cyanosis  Miranda: moves all visible extremities without noticeable abnormality  PSYCH/NEURO: pleasant and cooperative, no obvious depression, + anxious.Speech and thought processing grossly intact No facial focal deficit.  ASSESSMENT AND PLAN:  Discussed the following assessment and plan:  Other complicated headache syndrome - Plan: MR Brain Wo Contrast Pain is mainly fronto temporal and retroocular. We again reviewed possible  etiologies: Neuropathic pain,midraine among some. Recommend arranging eye exam to have intraocular pressure recheck.  Blood work results reassuring. Brain MRI will be scheduled. Instructed about warning signs.  Stage 3b chronic kidney disease Wr reviewed BPM's done for the past few years.Problem has been stable. Continue adequate hydration,low salt diet,and avoidance of NSAID's.  Hyperglycemia Some side effects of Prednisone reviewed. HgA1C can be checked next visit.  Urinary frequency She has problems since she started prednisone, it is not a common side effects in no diabetic patients. Prednisone to discontinued. Monitor for new urinary symptoms. If problem is not resolved after stopping Prednisone, further work up is going to be necessary.  I discussed the assessment and treatment plan with the patient. Miranda Herlong was provided an opportunity to ask questions and all were answered. She agreed with the plan and demonstrated an understanding of the instructions.   Return in about 2 weeks  (around 12/20/2019) for PCP.[name]    Delisia Mcquiston [place], MD

## 2019-12-12 ENCOUNTER — Other Ambulatory Visit: Payer: Self-pay

## 2019-12-13 ENCOUNTER — Ambulatory Visit (INDEPENDENT_AMBULATORY_CARE_PROVIDER_SITE_OTHER): Payer: 59 | Admitting: Internal Medicine

## 2019-12-13 VITALS — BP 120/84 | HR 83 | Temp 97.7°F | Wt 196.1 lb

## 2019-12-13 DIAGNOSIS — H5712 Ocular pain, left eye: Secondary | ICD-10-CM | POA: Diagnosis not present

## 2019-12-13 DIAGNOSIS — N3000 Acute cystitis without hematuria: Secondary | ICD-10-CM | POA: Diagnosis not present

## 2019-12-13 DIAGNOSIS — R350 Frequency of micturition: Secondary | ICD-10-CM

## 2019-12-13 LAB — POCT URINALYSIS DIPSTICK
Bilirubin, UA: NEGATIVE
Blood, UA: NEGATIVE
Glucose, UA: NEGATIVE
Ketones, UA: NEGATIVE
Nitrite, UA: NEGATIVE
Protein, UA: NEGATIVE
Spec Grav, UA: 1.01 (ref 1.010–1.025)
Urobilinogen, UA: 0.2 E.U./dL
pH, UA: 7 (ref 5.0–8.0)

## 2019-12-13 NOTE — Patient Instructions (Signed)
-  Nice seeing you today!!  -Complete out your course of cipro.  -Take tylenol sinus 2 tabs twice a day for 5-7 days.  -Take claritin/zyrtec daily for about 3-4 weeks.  -Come back to see me in 10-14 days if no better.

## 2019-12-13 NOTE — Progress Notes (Signed)
Established Patient Office Visit     This visit occurred during the SARS-CoV-2 public health emergency.  Safety protocols were in place, including screening questions prior to the visit, additional usage of staff PPE, and extensive cleaning of exam room while observing appropriate contact time as indicated for disinfecting solutions.    CC/Reason for Visit: Discuss headache, urinary frequency  HPI: Miranda Harris is a 59 y.o. female who is coming in today for the above mentioned reasons.  She has been seen a few times in the office in late February and early March due to severe pain behind her left eye and ear.  She was given doxycycline thinking that it could be acute sinusitis without improvement, she was subsequently given a prednisone taper and that helped some.  She feels like the pain is improving however still present.  She visited her ophthalmologist and was told that her intraocular pressures were fine.  Since being on prednisone she has noted elevated CBGs in the 150 range (she has been checking her husband's meter), she has also noticed increased urinary frequency.  This week she went to an urgent care and was prescribed Cipro for 5 days.  She is still having frequency, denies dysuria, fever, nausea, vomiting.  Past Medical/Surgical History: Past Medical History:  Diagnosis Date  . Bipolar disorder ()   . Elevated serum creatinine    related to lithium use  . Helicobacter pylori (H. pylori) infection    pt denies  . Hypothyroidism   . Perirectal fistula   . Thyroid disease   . Trochanteric bursitis of right hip     Past Surgical History:  Procedure Laterality Date  . EVALUATION UNDER ANESTHESIA WITH HEMORRHOIDECTOMY N/A 06/28/2017   Procedure: EXAM UNDER ANESTHESIA WITH EXTERNAL HEMORRHOIDECTOMY AND REPAIR OF PERIRECTAL FISTULA;  Surgeon: Michael Boston, MD;  Location: [facility];  Service: General;  Laterality: N/A;  . HERNIA REPAIR  [id number] yrs ago    umbilical hernia  . INCISION AND DRAINAGE PERIRECTAL ABSCESS N/A 04/18/2017   Procedure: IRRIGATION AND DEBRIDEMENT PERIRECTAL ABSCESS, placement of seton, sigmoidoscopy;  Surgeon: Excell Seltzer, MD;  Location: [facility];  Service: General;  Laterality: N/A;  . LAPAROSCOPIC APPENDECTOMY    . VAGINAL HYSTERECTOMY Bilateral    complete  . WISDOM TOOTH EXTRACTION      Social History:  reports that she has quit smoking. Her smoking use included cigarettes. She has a 30.00 pack-year smoking history. She has never used smokeless tobacco. She reports that she does not drink alcohol or use drugs.  Allergies: No Known Allergies  Family History:  Family History  Problem Relation Age of Onset  . Anxiety disorder Brother   . Breast cancer Neg Hx      Current Outpatient Medications:  .  Calcium Carbonate-Vitamin D (CALTRATE 600+D PO), Take 2 tablets by mouth daily., Disp: , Rfl:  .  lamoTRIgine (LAMICTAL) 25 MG tablet, Take 2 tablets (50 mg total) by mouth daily., Disp: 180 tablet, Rfl: 0 .  levothyroxine (SYNTHROID, LEVOTHROID) 150 MCG tablet, Take 1 tablet (150 mcg total) by mouth daily., Disp: 90 tablet, Rfl: 3 .  Multiple Vitamin (MULTIVITAMIN WITH MINERALS) TABS tablet, Take 1 tablet by mouth daily., Disp: , Rfl:  .  Omega-3 Fatty Acids (FISH OIL) 1000 MG CAPS, Take 2,000 mg by mouth daily., Disp: , Rfl:  .  PARoxetine (PAXIL) 20 MG tablet, Take 1 tablet (20 mg total) by mouth daily., Disp: 90 tablet, Rfl: 0 .  QUEtiapine (SEROQUEL) 100 MG tablet, Take 1.5 tablets (150 mg total) by mouth at bedtime., Disp: 135 tablet, Rfl: 0  Review of Systems:  Constitutional: Denies fever, chills, diaphoresis, appetite change and fatigue.  HEENT: Denies photophobia,  redness, hearing loss, congestion, sore throat, rhinorrhea, sneezing, mouth sores, trouble swallowing and tinnitus.   Respiratory: Denies SOB, DOE, cough, chest tightness,  and wheezing.   Cardiovascular: Denies chest pain, palpitations  and leg swelling.  Gastrointestinal: Denies nausea, vomiting, abdominal pain, diarrhea, constipation, blood in stool and abdominal distention.  Genitourinary: Denies dysuria, hematuria, flank pain and difficulty urinating.  Endocrine: Denies: hot or cold intolerance, sweats, changes in hair or nails, polyuria, polydipsia. Musculoskeletal: Denies myalgias, back pain, joint swelling, arthralgias and gait problem.  Skin: Denies pallor, rash and wound.  Neurological: Denies dizziness, seizures, syncope, weakness, light-headedness, numbness and headaches.  Hematological: Denies adenopathy. Easy bruising, personal or family bleeding history  Psychiatric/Behavioral: Denies suicidal ideation, mood changes, confusion, nervousness, sleep disturbance and agitation    Physical Exam: Vitals:   12/13/19 1103  BP: 120/84  Pulse: 83  Temp: 97.7 F (36.5 C)  TempSrc: Temporal  SpO2: 96%  Weight: 196 lb 1.6 oz (89 kg)    Body mass index is 29.82 kg/m.   Constitutional: NAD, calm, comfortable Eyes: PERRL, lids and conjunctivae normal, wears corrective lenses ENMT: Mucous membranes are moist. Tympanic membrane is pearly white, no erythema or bulging. Neck: normal, supple, no masses, no thyromegaly Neurologic: CN 2-12 grossly intact. Sensation intact, DTR normal. Strength 5/5 in all 4.  Psychiatric: Normal judgment and insight. Alert and oriented x 3. Normal mood.    Impression and Plan:  Acute cystitis without hematuria -She was diagnosed with a UTI at urgent care last week, was placed on 5 days of Cipro. -Dipstick today still shows leukocytes. -She has been advised to complete out her course of antibiotics.  Eye pain, left -Recent intraocular pressures are normal, suspect this is likely due to ethmoidal sinusitis.  Have recommended antihistamine, decongestants and pain relievers for the next week. -Also wonder if the sternocleidomastoid muscles, trapezius muscles may be tight due to  sleeping position.  For this have advised icing and local massage therapy. -Unlikely to be temporal arteritis as she has no vision disturbances and no pain around the temporal head.    Patient Instructions  -Nice seeing you today!!  -Complete out your course of cipro.  -Take tylenol sinus 2 tabs twice a day for 5-7 days.  -Take claritin/zyrtec daily for about 3-4 weeks.  -Come back to see me in 10-14 days if no better.     Lelon Frohlich, MD [facility]

## 2019-12-14 LAB — URINE CULTURE
MICRO NUMBER:: 10271144
Result:: NO GROWTH
SPECIMEN QUALITY:: ADEQUATE

## 2019-12-16 ENCOUNTER — Ambulatory Visit: Payer: Medicare Other | Admitting: Family Medicine

## 2019-12-18 ENCOUNTER — Encounter: Payer: Self-pay | Admitting: Internal Medicine

## 2019-12-20 ENCOUNTER — Other Ambulatory Visit: Payer: Self-pay

## 2019-12-20 ENCOUNTER — Telehealth (INDEPENDENT_AMBULATORY_CARE_PROVIDER_SITE_OTHER): Payer: 59 | Admitting: Internal Medicine

## 2019-12-20 ENCOUNTER — Other Ambulatory Visit: Payer: Medicare Other

## 2019-12-20 DIAGNOSIS — J0120 Acute ethmoidal sinusitis, unspecified: Secondary | ICD-10-CM

## 2019-12-20 MED ORDER — AMOXICILLIN-POT CLAVULANATE 875-125 MG PO TABS: 1.0000 | ORAL_TABLET | Freq: Two times a day (BID) | ORAL | 0 refills | Status: AC

## 2019-12-20 NOTE — Progress Notes (Signed)
Virtual Visit via Telephone Note  I connected with Miranda Harris on 12/20/19 at  9:30 AM EDT by telephone and verified that I am speaking with the correct person using two identifiers.   I discussed the limitations, risks, security and privacy concerns of performing an evaluation and management service by telephone and the availability of in person appointments. I also discussed with the patient that there may be a patient responsible charge related to this service. The patient expressed understanding and agreed to proceed.  Location patient: home Location provider: work office Participants present for the call: patient, provider Patient did not have a visit in the prior 7 days to address this/these issue(s).   History of Present Illness:  She has scheduled this visit to discuss continued sinus issues.  I had seen her on the 19th and had recommended antihistamines, decongestants, Mucinex.  She calls today as she continues to have pain around her left eye into ear.  She denies fevers, she has noted some more nasal drainage.   Observations/Objective: Patient sounds cheerful and well on the phone. I do not appreciate any increased work of breathing. Speech and thought processing are grossly intact. Patient reported vitals: None reported   Current Outpatient Medications:  .  Calcium Carbonate-Vitamin D (CALTRATE 600+D PO), Take 2 tablets by mouth daily., Disp: , Rfl:  .  lamoTRIgine (LAMICTAL) 25 MG tablet, Take 2 tablets (50 mg total) by mouth daily., Disp: 180 tablet, Rfl: 0 .  levothyroxine (SYNTHROID, LEVOTHROID) 150 MCG tablet, Take 1 tablet (150 mcg total) by mouth daily., Disp: 90 tablet, Rfl: 3 .  Multiple Vitamin (MULTIVITAMIN WITH MINERALS) TABS tablet, Take 1 tablet by mouth daily., Disp: , Rfl:  .  Omega-3 Fatty Acids (FISH OIL) 1000 MG CAPS, Take 2,000 mg by mouth daily., Disp: , Rfl:  .  PARoxetine (PAXIL) 20 MG tablet, Take 1 tablet (20 mg total) by mouth daily.,  Disp: 90 tablet, Rfl: 0 .  QUEtiapine (SEROQUEL) 100 MG tablet, Take 1.5 tablets (150 mg total) by mouth at bedtime., Disp: 135 tablet, Rfl: 0 .  amoxicillin-clavulanate (AUGMENTIN) 875-125 MG tablet, Take 1 tablet by mouth 2 (two) times daily for 7 days., Disp: 14 tablet, Rfl: 0  Review of Systems:  Constitutional: Denies fever, chills, diaphoresis, appetite change and fatigue.  HEENT: Denies photophobia,  redness, hearing loss,sore throat, sneezing, mouth sores, trouble swallowing, neck pain, neck stiffness and tinnitus.   Respiratory: Denies SOB, DOE, cough, chest tightness,  and wheezing.   Cardiovascular: Denies chest pain, palpitations and leg swelling.  Gastrointestinal: Denies nausea, vomiting, abdominal pain, diarrhea, constipation, blood in stool and abdominal distention.  Genitourinary: Denies dysuria, urgency, frequency, hematuria, flank pain and difficulty urinating.  Endocrine: Denies: hot or cold intolerance, sweats, changes in hair or nails, polyuria, polydipsia. Musculoskeletal: Denies myalgias, back pain, joint swelling, arthralgias and gait problem.  Skin: Denies pallor, rash and wound.  Neurological: Denies dizziness, seizures, syncope, weakness, light-headedness, numbness and headaches.  Hematological: Denies adenopathy. Easy bruising, personal or family bleeding history  Psychiatric/Behavioral: Denies suicidal ideation, mood changes, confusion, nervousness, sleep disturbance and agitation   Assessment and Plan:  Subacute ethmoidal sinusitis -Given timeline, will treat as bacterial with Augmentin twice daily x7 days. -If continues to have issues, may benefit from ENT referral.   I discussed the assessment and treatment plan with the patient. The patient was provided an opportunity to ask questions and all were answered. The patient agreed with the plan and demonstrated an understanding  of the instructions.   The patient was advised to call back or seek an in-person  evaluation if the symptoms worsen or if the condition fails to improve as anticipated.  I provided 14 minutes of non-face-to-face time during this encounter.   Lelon Frohlich, MD [facility]

## 2019-12-25 ENCOUNTER — Ambulatory Visit: Payer: Medicare Other | Admitting: Internal Medicine

## 2019-12-25 ENCOUNTER — Encounter: Payer: Self-pay | Admitting: Internal Medicine

## 2019-12-25 DIAGNOSIS — N1832 Chronic kidney disease, stage 3b: Secondary | ICD-10-CM

## 2019-12-30 ENCOUNTER — Other Ambulatory Visit: Payer: Self-pay

## 2019-12-30 ENCOUNTER — Other Ambulatory Visit (INDEPENDENT_AMBULATORY_CARE_PROVIDER_SITE_OTHER): Payer: 59

## 2019-12-30 DIAGNOSIS — N1832 Chronic kidney disease, stage 3b: Secondary | ICD-10-CM

## 2019-12-30 LAB — BASIC METABOLIC PANEL
BUN: 23 mg/dL (ref 6–23)
CO2: 23 mEq/L (ref 19–32)
Calcium: 9.4 mg/dL (ref 8.4–10.5)
Chloride: 107 mEq/L (ref 96–112)
Creatinine, Ser: 1.17 mg/dL (ref 0.40–1.20)
GFR: 47.18 mL/min — ABNORMAL LOW (ref >60.00)
Glucose, Bld: 84 mg/dL (ref 70–99)
Potassium: 4.9 mEq/L (ref 3.5–5.1)
Sodium: 138 mEq/L (ref 135–145)

## 2019-12-31 ENCOUNTER — Other Ambulatory Visit: Payer: Self-pay | Admitting: Internal Medicine

## 2019-12-31 ENCOUNTER — Encounter: Payer: Medicare Other | Admitting: Internal Medicine

## 2019-12-31 DIAGNOSIS — J329 Chronic sinusitis, unspecified: Secondary | ICD-10-CM

## 2020-01-06 ENCOUNTER — Telehealth: Payer: Self-pay | Admitting: Internal Medicine

## 2020-01-06 NOTE — Telephone Encounter (Signed)
Left detailed message letting pt know referral was placed and sent and gave number [phone] to call to set up an appt

## 2020-01-06 NOTE — Telephone Encounter (Signed)
Pt states her PCP was suppose to send a referral to ENT but she has not received a call from them to schedule an appt.     Pt can be reached at [phone], she would like a call ASAP when her PCP is in the office tomorrow regarding referral

## 2020-01-09 ENCOUNTER — Encounter: Payer: Self-pay | Admitting: Internal Medicine

## 2020-01-13 ENCOUNTER — Other Ambulatory Visit: Payer: Self-pay

## 2020-01-13 ENCOUNTER — Ambulatory Visit (INDEPENDENT_AMBULATORY_CARE_PROVIDER_SITE_OTHER): Payer: 59 | Admitting: Otolaryngology

## 2020-01-13 VITALS — Temp 97.3°F

## 2020-01-13 DIAGNOSIS — M26609 Unspecified temporomandibular joint disorder, unspecified side: Secondary | ICD-10-CM

## 2020-01-13 DIAGNOSIS — H9202 Otalgia, left ear: Secondary | ICD-10-CM

## 2020-01-13 DIAGNOSIS — J310 Chronic rhinitis: Secondary | ICD-10-CM | POA: Diagnosis not present

## 2020-01-13 NOTE — Progress Notes (Signed)
HPI: Miranda Harris is a 59 y.o. female who presents is referred by her PCP for evaluation of sinuses.  Apparently patient has had intermittent pain behind her left eye and left ear pain over the past 2 months.  She denies any nasal obstruction or mucopurulent discharge from her nose.  She has been treated with a course of amoxicillin without much benefit.  She has taken Tylenol sinus which seems to help a little bit.  The pain behind the left eye seems to be worse in the mornings. She has not noted any hearing problems and no drainage from her ears..  Past Medical History:  Diagnosis Date  . Bipolar disorder ([place])   . Elevated serum creatinine    related to lithium use  . Helicobacter pylori (H. pylori) infection    pt denies  . Hypothyroidism   . Perirectal fistula   . Thyroid disease   . Trochanteric bursitis of right hip    Past Surgical History:  Procedure Laterality Date  . EVALUATION UNDER ANESTHESIA WITH HEMORRHOIDECTOMY N/A 06/28/2017   Procedure: EXAM UNDER ANESTHESIA WITH EXTERNAL HEMORRHOIDECTOMY AND REPAIR OF PERIRECTAL FISTULA;  Surgeon: Michael Boston, MD;  Location: [facility];  Service: General;  Laterality: N/A;  . HERNIA REPAIR  [id number] yrs ago   umbilical hernia  . INCISION AND DRAINAGE PERIRECTAL ABSCESS N/A 04/18/2017   Procedure: IRRIGATION AND DEBRIDEMENT PERIRECTAL ABSCESS, placement of seton, sigmoidoscopy;  Surgeon: Excell Seltzer, MD;  Location: [facility];  Service: General;  Laterality: N/A;  . LAPAROSCOPIC APPENDECTOMY    . VAGINAL HYSTERECTOMY Bilateral    complete  . WISDOM TOOTH EXTRACTION     Social History   Socioeconomic History  . Marital status: Married    Spouse name: Not on file  . Number of children: Not on file  . Years of education: Not on file  . Highest education level: Not on file  Occupational History  . Not on file  Tobacco Use  . Smoking status: Former Smoker    Packs/day: 1.00    Years: 30.00    Pack years:  30.00    Types: Cigarettes  . Smokeless tobacco: Never Used  . Tobacco comment: quit 2008  Substance and Sexual Activity  . Alcohol use: No    Alcohol/week: 0.0 standard drinks    Comment: Occasional use  . Drug use: No  . Sexual activity: Yes    Partners: Male    Birth control/protection: Surgical  Other Topics Concern  . Not on file  Social History Narrative  . Not on file   Social Determinants of Health   Financial Resource Strain:   . Difficulty of Paying Living Expenses:   Food Insecurity:   . Worried About Charity fundraiser in the Last Year:   . Arboriculturist in the Last Year:   Transportation Needs:   . Film/video editor (Medical):   [name] Lack of Transportation (Non-Medical):   Physical Activity:   . Days of Exercise per Week:   . Minutes of Exercise per Session:   Stress:   . Feeling of Stress :   Social Connections:   . Frequency of Communication with Friends and Family:   . Frequency of Social Gatherings with Friends and Family:   . Attends Religious Services:   . Active Member of Clubs or Organizations:   . Attends Archivist Meetings:   [name] Marital Status:    Family History  Problem Relation Age of Onset  .  Anxiety disorder Brother   . Breast cancer Neg Hx    No Known Allergies Prior to Admission medications   Medication Sig Start Date End Date Taking? Authorizing Provider  Calcium Carbonate-Vitamin D (CALTRATE 600+D PO) Take 2 tablets by mouth daily.   Yes [provider]  lamoTRIgine (LAMICTAL) 25 MG tablet Take 2 tablets (50 mg total) by mouth daily. 11/04/19  Yes Nevada Crane, MD  levothyroxine (SYNTHROID, LEVOTHROID) 150 MCG tablet Take 1 tablet (150 mcg total) by mouth daily. 11/08/18  Yes Isaac Bliss, Rayford Halsted, MD  Multiple Vitamin (MULTIVITAMIN WITH MINERALS) TABS tablet Take 1 tablet by mouth daily.   Yes [provider]  Omega-3 Fatty Acids (FISH OIL) 1000 MG CAPS Take 2,000 mg by mouth daily.   Yes [provider]  PARoxetine (PAXIL) 20 MG tablet Take 1 tablet (20 mg total) by mouth daily. 11/04/19  Yes Nevada Crane, MD  QUEtiapine (SEROQUEL) 100 MG tablet Take 1.5 tablets (150 mg total) by mouth at bedtime. 11/04/19 05/02/20 Yes Nevada Crane, MD     Positive ROS: Otherwise negative  All other systems have been reviewed and were otherwise negative with the exception of those mentioned in the HPI and as above.  Physical Exam: Constitutional: Alert, well-appearing, no acute distress Ears: External ears without lesions or tenderness.  Ear canals are clear bilaterally with no evidence of infection or inflammation within the ear canals.  Both TMs are clear with good mobility on pneumatic otoscopy.  Hearing screening with a 512 1024 tuning fork she hears well bilaterally with AC > BC bilaterally. Nasal: External nose without lesions. Septum slightly deviated to the left with mild rhinitis..  Both middle meatus regions are clear. Nasal endoscopy was performed in the office today and on nasal endoscopy both middle meatus regions were clear.  Posterior ethmoid area is clear.  Sphenoid region is clear with no active mucopurulent discharge noted. Oral: Lips and gums without lesions. Tongue and palate mucosa without lesions. Posterior oropharynx clear. Neck: No palpable adenopathy or masses.  No pain noted on wiggling the ears.  However she had significant pain on opening and closing the left TMJ region with slight popping consistent with TMJ dysfunction. Respiratory: Breathing comfortably  Skin: No facial/neck lesions or rash noted.  Nasal/sinus endoscopy  Date/Time: 01/13/2020 7:59 PM Performed by: Rozetta Nunnery, MD Authorized by: Rozetta Nunnery, MD   Consent:    Consent obtained:  Verbal   Consent given by:  Patient Procedure details:    Indications: sino-nasal symptoms     Medication:  Afrin   Instrument: flexible fiberoptic nasal endoscope   Septum:    normal   Sinus:     Right middle meatus: normal     Left middle meatus: normal     Right nasopharynx: normal     Left nasopharynx: normal     Right Eustachian tube orifices: normal     Left Eustachian tube orifices: normal   Comments:     On nasal endoscopy there is no obvious clinical evidence of acute sinus infection.    Assessment: I suspect the left ear pain and left retro-orbital pain is most likely related to chronic TMJ dysfunction. She does have mild rhinitis but on intranasal exam no evidence intranasally of acute sinusitis.  Plan: Reviewed with the patient today concerning left ear pain left retro-orbital pain I suspect is probably more related to TMJ dysfunction the actual infection. Unfortunately she is unable to take NSAIDs because of chronic  kidney problems. Suggested regular use of Nasacort 2 sprays each nostril at night as this will help with any nasal congestion and/or sinus blockage. Concerning treatment of her TMJ dysfunction recommended a soft diet and possible use of steroids although she did not want to take steroids I wrote a prescription for Ceftin 250 mg twice daily for 10 days to take if the pain persist.  If the pain persists beyond 3 weeks she will call us back to obtain a CT scan which will offer better evaluation of the sinuses although I suspect this is probably more related to TMJ dysfunction. Especially if she grinds her teeth at night she might consider visit with her dentist and getting a mouthguard.   Radene Journey, MD   CC:

## 2020-01-29 ENCOUNTER — Encounter: Payer: Self-pay | Admitting: Internal Medicine

## 2020-01-29 DIAGNOSIS — E039 Hypothyroidism, unspecified: Secondary | ICD-10-CM

## 2020-01-29 NOTE — Progress Notes (Signed)
Virtual Visit via Video Note  I connected with Miranda Harris on 02/03/20 at  2:30 PM EDT by a video enabled telemedicine application and verified that I am speaking with the correct person using two identifiers.   I discussed the limitations of evaluation and management by telemedicine and the availability of in person appointments. The patient expressed understanding and agreed to proceed.    I discussed the assessment and treatment plan with the patient. The patient was provided an opportunity to ask questions and all were answered. The patient agreed with the plan and demonstrated an understanding of the instructions.   The patient was advised to call back or seek an in-person evaluation if the symptoms worsen or if the condition fails to improve as anticipated.  I provided 15 minutes of non-face-to-face time during this encounter.   [name], MD    [facility] MD/PA/NP OP Progress Note  02/03/2020 2:57 PM Miranda Harris  MRN:  [id number]  Chief Complaint:  Chief Complaint    Follow-up; Other     HPI:  This is a follow-up appointment for bipolar disorder.  She states that she has been doing very well.  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. She enjoys this work. 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  She tapered down quetiapine due to fatigue/hypersomnia since she talked with Dr. Toy Care. She has less fatigue and denies any issues with her mood symptoms.  She sleeps 7 to 8 hours.  She denies feeling depressed.  She has good energy.  She has good concentration since being off lithium.  She has good appetite. She reportedly lost weight since she came to [facility] (used to weigh 240 lbs), which she attributes to work at that time and taking a walk.  She denies SI.  She denies anxiety.  She denies decreased need for sleep or euphoria.   Wt Readings from Last 3 Encounters:  12/31/19 190 lb (86.2 kg)  12/13/19 196 lb 1.6 oz (89 kg)   11/29/19 193 lb (87.5 kg)    Visit Diagnosis:    ICD-10-CM   1. Bipolar 1 disorder (HCC)  F31.9 lamoTRIgine (LAMICTAL) 25 MG tablet    PARoxetine (PAXIL) 20 MG tablet    QUEtiapine (SEROQUEL) 100 MG tablet    Past Psychiatric History: Please see initial evaluation for full details. I have reviewed the history. No updates at this time.     Past Medical History:  Past Medical History:  Diagnosis Date  . Bipolar disorder ([place])   . Elevated serum creatinine    related to lithium use  . Helicobacter pylori (H. pylori) infection    pt denies  . Hypothyroidism   . Perirectal fistula   . Thyroid disease   . Trochanteric bursitis of right hip     Past Surgical History:  Procedure Laterality Date  . EVALUATION UNDER ANESTHESIA WITH HEMORRHOIDECTOMY N/A 06/28/2017   Procedure: EXAM UNDER ANESTHESIA WITH EXTERNAL HEMORRHOIDECTOMY AND REPAIR OF PERIRECTAL FISTULA;  Surgeon: Michael Boston, MD;  Location: [place];  Service: General;  Laterality: N/A;  . HERNIA REPAIR  [id number] yrs ago   umbilical hernia  . INCISION AND DRAINAGE PERIRECTAL ABSCESS N/A 04/18/2017   Procedure: IRRIGATION AND DEBRIDEMENT PERIRECTAL ABSCESS, placement of seton, sigmoidoscopy;  Surgeon: Excell Seltzer, MD;  [facility];  Service: General;  Laterality: N/A;  . LAPAROSCOPIC APPENDECTOMY    . VAGINAL HYSTERECTOMY Bilateral    complete  .  WISDOM TOOTH EXTRACTION      Family Psychiatric History: Please see initial evaluation for full details. I have reviewed the history. No updates at this time.     Family History:  Family History  Problem Relation Age of Onset  . Anxiety disorder Brother   . Breast cancer Neg Hx     Social History:  Social History   Socioeconomic History  . Marital status: Married    Spouse name: Not on file  . Number of children: Not on file  . Years of education: Not on file  . Highest education level: Not on file  Occupational History  . Not on file   Tobacco Use  . Smoking status: Former Smoker    Packs/day: 1.00    Years: 30.00    Pack years: 30.00    Types: Cigarettes  . Smokeless tobacco: Never Used  . Tobacco comment: quit 2008  Substance and Sexual Activity  . Alcohol use: No    Alcohol/week: 0.0 standard drinks    Comment: Occasional use  . Drug use: No  . Sexual activity: Yes    Partners: Male    Birth control/protection: Surgical  Other Topics Concern  . Not on file  Social History Narrative  . Not on file   Social Determinants of Health   Financial Resource Strain:   . Difficulty of Paying Living Expenses:   Food Insecurity:   . Worried About Charity fundraiser in the Last Year:   . Arboriculturist in the Last Year:   Transportation Needs:   . Film/video editor (Medical):   [name] Lack of Transportation (Non-Medical):   Physical Activity:   . Days of Exercise per Week:   . Minutes of Exercise per Session:   Stress:   . Feeling of Stress :   Social Connections:   . Frequency of Communication with Friends and Family:   . Frequency of Social Gatherings with Friends and Family:   . Attends Religious Services:   . Active Member of Clubs or Organizations:   . Attends Archivist Meetings:   [name] Marital Status:     Allergies: No Known Allergies  Metabolic Disorder Labs: Lab Results  Component Value Date   HGBA1C 5.4 06/10/2015   MPG 108 06/10/2015   No results found for: PROLACTIN Lab Results  Component Value Date   CHOL 208 (H) 11/30/2018   TRIG 116.0 11/30/2018   HDL 55.00 11/30/2018   CHOLHDL 4 11/30/2018   VLDL 23.2 11/30/2018   LDLCALC 130 (H) 11/30/2018   Lab Results  Component Value Date   TSH 1.94 11/29/2019   TSH 1.48 05/22/2019    Therapeutic Level Labs: Lab Results  Component Value Date   LITHIUM 1.2 11/08/2018   LITHIUM 0.80 06/10/2015   No results found for: VALPROATE No components found for:  CBMZ  Current Medications: Current Outpatient Medications   Medication Sig Dispense Refill  . Calcium Carbonate-Vitamin D (CALTRATE 600+D PO) Take 2 tablets by mouth daily.    [name] lamoTRIgine (LAMICTAL) 25 MG tablet Take 2 tablets (50 mg total) by mouth daily. 180 tablet 0  . levothyroxine (SYNTHROID) 150 MCG tablet Take 1 tablet (150 mcg total) by mouth daily. 90 tablet 1  . Multiple Vitamin (MULTIVITAMIN WITH MINERALS) TABS tablet Take 1 tablet by mouth daily.    . Omega-3 Fatty Acids (FISH OIL) 1000 MG CAPS Take 2,000 mg by mouth daily.    [name] PARoxetine (PAXIL) 20 MG tablet  Take 1 tablet (20 mg total) by mouth daily. 90 tablet 0  . QUEtiapine (SEROQUEL) 100 MG tablet Take 1 tablet (100 mg total) by mouth at bedtime. 90 tablet 0   No current facility-administered medications for this visit.     Musculoskeletal: Strength & Muscle Tone: N/A Gait & Station: N/A Patient leans: N/A  Psychiatric Specialty Exam: Review of Systems  Psychiatric/Behavioral: Negative for agitation, behavioral problems, confusion, decreased concentration, dysphoric mood, hallucinations, self-injury, sleep disturbance and suicidal ideas. The patient is not nervous/anxious and is not hyperactive.   All other systems reviewed and are negative.   There were no vitals taken for this visit.There is no height or weight on file to calculate BMI.  General Appearance: Fairly Groomed  Eye Contact:  Good  Speech:  Clear and Coherent  Volume:  Normal  Mood:  good  Affect:  Appropriate, Congruent and Full Range  Thought Process:  Coherent  Orientation:  Full (Time, Place, and Person)  Thought Content: Logical   Suicidal Thoughts:  No  Homicidal Thoughts:  No  Memory:  Immediate;   Good  Judgement:  Good  Insight:  Good  Psychomotor Activity:  Normal  Concentration:  Concentration: Good and Attention Span: Good  Recall:  Good  Fund of Knowledge: Good  Language: Good  Akathisia:  No  Handed:  Right  AIMS (if indicated): not done  Assets:  Communication Skills Desire for  Improvement  ADL's:  Intact  Cognition: WNL  Sleep:  Good   Screenings: PHQ2-9     Office Visit from 12/13/2019 in [place] at [facility] from 11/08/2018 in [place] at [facility] Total Score  0  0  PHQ-9 Total Score  6  -       Assessment and Plan:  Miranda Harris is a 59 y.o. 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  We will continue current medication regimen.  Will continue lamotrigine for bipolar disorder.  Discussed risk of Stevens-Johnson syndrome.  Will continue quetiapine for bipolar disorder.  Discussed risk of potential metabolic side effect.  Will continue Paxil for bipolar disorder.   Plan:  I have reviewed and updated plans as below 1. Continue Paxil 20 mgat night  2. Continue lamotrigine 50 mg daily  3.Continue quetiapine 100 mg at night (tapered down from 150 mg since the last visit) 4.Next appointment: 8/3 at 9:10 for 20 mins, video  Past trials of medication: Sertraline,fluoxetine (drowsiness),Lexapro,Duloxetine,venlafaxine (initially worked well, stopped working, hair loss),Wellbutrin(insomnia),Latuda,Abilify (flu like symptoms), Haldol (EPS), Thorazine (EPS), Stelazine, Lithium, Lunesta (sedation), Ambien (sleepwalking, driving, eating), Trazodone (excessive sedation), Xanax  The patient demonstrates the following risk factors for suicide: Chronic risk factorsfor suicide include psychiatric disorder /bipolar disorder,history of substance use disorder, family history of suicide attempt.  Acute risk factorsfor suicide includenone.Protective factorsfor this patient include positive social support, responsibility to others (family), coping skills, hope for the future. Although patient reports passive SI, she adamantly denies any intent and has had no previous suicide attempt.Considering these factors, the overall suicide risk at this point appears to be low. [name], MD 02/03/2020, 2:57 PM

## 2020-01-30 MED ORDER — LEVOTHYROXINE SODIUM 150 MCG PO TABS: 150.0000 ug | ORAL_TABLET | Freq: Every day | ORAL | 1 refills | Status: DC

## 2020-02-03 ENCOUNTER — Other Ambulatory Visit: Payer: Self-pay

## 2020-02-03 ENCOUNTER — Telehealth (INDEPENDENT_AMBULATORY_CARE_PROVIDER_SITE_OTHER): Payer: 59 | Admitting: Psychiatry

## 2020-02-03 ENCOUNTER — Encounter (HOSPITAL_COMMUNITY): Payer: Self-pay | Admitting: Psychiatry

## 2020-02-03 DIAGNOSIS — F319 Bipolar disorder, unspecified: Secondary | ICD-10-CM

## 2020-02-03 MED ORDER — PAROXETINE HCL 20 MG PO TABS: 20.0000 mg | ORAL_TABLET | Freq: Every day | ORAL | 0 refills | Status: DC

## 2020-02-03 MED ORDER — QUETIAPINE FUMARATE 100 MG PO TABS: 100.0000 mg | ORAL_TABLET | Freq: Every day | ORAL | 0 refills | Status: DC

## 2020-02-03 MED ORDER — LAMOTRIGINE 25 MG PO TABS: 50.0000 mg | ORAL_TABLET | Freq: Every day | ORAL | 0 refills | Status: DC

## 2020-02-03 NOTE — Patient Instructions (Addendum)
1. Continue Paxil 20 mgat night  2. Continue lamotrigine 50 mg daily  3.Continue quetiapine 100 mg at night  4.Next appointment: 8/3 at 9:10

## 2020-02-17 ENCOUNTER — Ambulatory Visit: Payer: Medicare Other | Admitting: Family Medicine

## 2020-02-19 ENCOUNTER — Other Ambulatory Visit: Payer: Self-pay | Admitting: Family Medicine

## 2020-02-19 ENCOUNTER — Telehealth (INDEPENDENT_AMBULATORY_CARE_PROVIDER_SITE_OTHER): Payer: 59 | Admitting: Adult Health

## 2020-02-19 ENCOUNTER — Other Ambulatory Visit: Payer: Self-pay

## 2020-02-19 ENCOUNTER — Encounter: Payer: Self-pay | Admitting: Adult Health

## 2020-02-19 VITALS — Temp 98.4°F | Wt 185.0 lb

## 2020-02-19 DIAGNOSIS — R350 Frequency of micturition: Secondary | ICD-10-CM | POA: Diagnosis not present

## 2020-02-19 DIAGNOSIS — R300 Dysuria: Secondary | ICD-10-CM

## 2020-02-19 DIAGNOSIS — R509 Fever, unspecified: Secondary | ICD-10-CM

## 2020-02-19 LAB — POC URINALSYSI DIPSTICK (AUTOMATED)
Bilirubin, UA: NEGATIVE
Blood, UA: NEGATIVE
Glucose, UA: NEGATIVE
Ketones, UA: NEGATIVE
Leukocytes, UA: NEGATIVE
Nitrite, UA: NEGATIVE
Protein, UA: NEGATIVE
Spec Grav, UA: 1.01 (ref 1.010–1.025)
Urobilinogen, UA: 0.2 E.U./dL
pH, UA: 6 (ref 5.0–8.0)

## 2020-02-19 MED ORDER — CIPROFLOXACIN HCL 500 MG PO TABS: 500.0000 mg | ORAL_TABLET | Freq: Two times a day (BID) | ORAL | 0 refills | Status: AC

## 2020-02-19 NOTE — Progress Notes (Signed)
Virtual Visit via Video Note  I connected with Chemika Vesely on 02/19/20 at 11:00 AM EDT by a video enabled telemedicine application and verified that I am speaking with the correct person using two identifiers.  Location patient: home Location provider:work or home office Persons participating in the virtual visit: patient, provider  I discussed the limitations of evaluation and management by telemedicine and the availability of in person appointments. The patient expressed understanding and agreed to proceed.   HPI: Female who is being evaluated today for concern of UTI.  Her symptoms started about 6 days ago but became worse yesterday.  Her symptoms include low back pain that started this morning, frequent urination, urinary urgency, mild dysuria, decreased urination, feeling fatigued, and a subjective fever.  She denies hematuria   ROS: See pertinent positives and negatives per HPI.  Past Medical History:  Diagnosis Date  . Bipolar disorder ([place])   . Elevated serum creatinine    related to lithium use  . Helicobacter pylori (H. pylori) infection    pt denies  . Hypothyroidism   . Perirectal fistula   . Thyroid disease   . Trochanteric bursitis of right hip     Past Surgical History:  Procedure Laterality Date  . EVALUATION UNDER ANESTHESIA WITH HEMORRHOIDECTOMY N/A 06/28/2017   Procedure: EXAM UNDER ANESTHESIA WITH EXTERNAL HEMORRHOIDECTOMY AND REPAIR OF PERIRECTAL FISTULA;  Surgeon: Michael Boston, MD;  Location: [place];  Service: General;  Laterality: N/A;  . HERNIA REPAIR  [id number] yrs ago   umbilical hernia  . INCISION AND DRAINAGE PERIRECTAL ABSCESS N/A 04/18/2017   Procedure: IRRIGATION AND DEBRIDEMENT PERIRECTAL ABSCESS, placement of seton, sigmoidoscopy;  Surgeon: Excell Seltzer, MD;  [facility];  Service: General;  Laterality: N/A;  . LAPAROSCOPIC APPENDECTOMY    . VAGINAL HYSTERECTOMY Bilateral    complete  . WISDOM TOOTH EXTRACTION       Family History  Problem Relation Age of Onset  . Anxiety disorder Brother   . Breast cancer Neg Hx        Current Outpatient Medications:  .  lamoTRIgine (LAMICTAL) 25 MG tablet, Take 2 tablets (50 mg total) by mouth daily., Disp: 180 tablet, Rfl: 0 .  levothyroxine (SYNTHROID) 150 MCG tablet, Take 1 tablet (150 mcg total) by mouth daily., Disp: 90 tablet, Rfl: 1 .  Multiple Vitamin (MULTIVITAMIN WITH MINERALS) TABS tablet, Take 1 tablet by mouth daily., Disp: , Rfl:  .  Omega-3 Fatty Acids (FISH OIL) 1000 MG CAPS, Take 2,000 mg by mouth daily., Disp: , Rfl:  .  PARoxetine (PAXIL) 20 MG tablet, Take 1 tablet (20 mg total) by mouth daily., Disp: 90 tablet, Rfl: 0 .  QUEtiapine (SEROQUEL) 100 MG tablet, Take 1 tablet (100 mg total) by mouth at bedtime., Disp: 90 tablet, Rfl: 0 .  Calcium Carbonate-Vitamin D (CALTRATE 600+D PO), Take 2 tablets by mouth daily., Disp: , Rfl:  .  ciprofloxacin (CIPRO) 500 MG tablet, Take 1 tablet (500 mg total) by mouth 2 (two) times daily for 5 days., Disp: 10 tablet, Rfl: 0  EXAM:  VITALS per patient if applicable:  GENERAL: alert, oriented, appears well and in no acute distress  HEENT: atraumatic, conjunttiva clear, no obvious abnormalities on inspection of external nose and ears  NECK: normal movements of the head and neck  LUNGS: on inspection no signs of respiratory distress, breathing rate appears normal, no obvious gross SOB, gasping or wheezing  CV: no obvious cyanosis  MS: moves all visible extremities  without noticeable abnormality  PSYCH/NEURO: pleasant and cooperative, no obvious depression or anxiety, speech and thought processing grossly intact  ASSESSMENT AND PLAN:  Discussed the following assessment and plan:  Urine frequency - Plan: POCT Urinalysis Dipstick (Automated), Culture, Urine, ciprofloxacin (CIPRO) 500 MG tablet POC urinalysis was negative, will treat due to symptoms with Cipro 500 mg twice daily x5 days and send  urine culture.  Stay well-hydrated and follow-up as needed    I discussed the assessment and treatment plan with the patient. The patient was provided an opportunity to ask questions and all were answered. The patient agreed with the plan and demonstrated an understanding of the instructions.   The patient was advised to call back or seek an in-person evaluation if the symptoms worsen or if the condition fails to improve as anticipated.   Dorothyann Peng, NP

## 2020-02-20 LAB — URINE CULTURE
MICRO NUMBER:: 10522497
SPECIMEN QUALITY:: ADEQUATE

## 2020-02-25 ENCOUNTER — Other Ambulatory Visit: Payer: Self-pay

## 2020-02-26 ENCOUNTER — Encounter: Payer: Self-pay | Admitting: Internal Medicine

## 2020-02-26 ENCOUNTER — Ambulatory Visit (INDEPENDENT_AMBULATORY_CARE_PROVIDER_SITE_OTHER): Payer: 59 | Admitting: Internal Medicine

## 2020-02-26 VITALS — BP 110/80 | HR 77 | Temp 97.8°F | Wt 196.7 lb

## 2020-02-26 DIAGNOSIS — R109 Unspecified abdominal pain: Secondary | ICD-10-CM | POA: Diagnosis not present

## 2020-02-26 DIAGNOSIS — R300 Dysuria: Secondary | ICD-10-CM | POA: Diagnosis not present

## 2020-02-26 LAB — POCT URINALYSIS DIPSTICK
Bilirubin, UA: NEGATIVE
Blood, UA: NEGATIVE
Clarity, UA: NORMAL
Glucose, UA: NEGATIVE
Ketones, UA: NEGATIVE
Leukocytes, UA: NEGATIVE
Nitrite, UA: NEGATIVE
Protein, UA: NEGATIVE
Spec Grav, UA: 1.015 (ref 1.010–1.025)
Urobilinogen, UA: 0.2 E.U./dL
pH, UA: 5.5 (ref 5.0–8.0)

## 2020-02-26 NOTE — Progress Notes (Signed)
Established Patient Office Visit     This visit occurred during the SARS-CoV-2 public health emergency.  Safety protocols were in place, including screening questions prior to the visit, additional usage of staff PPE, and extensive cleaning of exam room while observing appropriate contact time as indicated for disinfecting solutions.    CC/Reason for Visit: Persistent urinary symptoms  HPI: Miranda Harris is a 59 y.o. female who is coming in today for the above mentioned reasons.  She was seen on 5/26 by another clinic provider with urinary symptoms.  Despite negative dipstick she was given 5 days of Cipro which she has completed.  She completed antibiotics on Sunday.  The next day she started having recurrence of her urinary symptoms.  These  include increased frequency and hesitancy, not so much dysuria.  She had some lightheadedness, worse when working outside in the yard in the heat.  She has been a little fatigued and then developed some left-sided flank pain.  She has not had a fever.  Her T-max has been 98.2   Past Medical/Surgical History: Past Medical History:  Diagnosis Date  . Bipolar disorder ([place])   . Elevated serum creatinine    related to lithium use  . Helicobacter pylori (H. pylori) infection    pt denies  . Hypothyroidism   . Perirectal fistula   . Thyroid disease   . Trochanteric bursitis of right hip     Past Surgical History:  Procedure Laterality Date  . EVALUATION UNDER ANESTHESIA WITH HEMORRHOIDECTOMY N/A 06/28/2017   Procedure: EXAM UNDER ANESTHESIA WITH EXTERNAL HEMORRHOIDECTOMY AND REPAIR OF PERIRECTAL FISTULA;  Surgeon: Michael Boston, MD;  Location: [place];  Service: General;  Laterality: N/A;  . HERNIA REPAIR  [id number] yrs ago   umbilical hernia  . INCISION AND DRAINAGE PERIRECTAL ABSCESS N/A 04/18/2017   Procedure: IRRIGATION AND DEBRIDEMENT PERIRECTAL ABSCESS, placement of seton, sigmoidoscopy;  Surgeon: Excell Seltzer,  MD;  [facility];  Service: General;  Laterality: N/A;  . LAPAROSCOPIC APPENDECTOMY    . VAGINAL HYSTERECTOMY Bilateral    complete  . WISDOM TOOTH EXTRACTION      Social History:  reports that she has quit smoking. Her smoking use included cigarettes. She has a 30.00 pack-year smoking history. She has never used smokeless tobacco. She reports that she does not drink alcohol or use drugs.  Allergies: No Known Allergies  Family History:  Family History  Problem Relation Age of Onset  . Anxiety disorder Brother   . Breast cancer Neg Hx      Current Outpatient Medications:  .  Calcium Carbonate-Vitamin D (CALTRATE 600+D PO), Take 2 tablets by mouth daily., Disp: , Rfl:  .  lamoTRIgine (LAMICTAL) 25 MG tablet, Take 2 tablets (50 mg total) by mouth daily., Disp: 180 tablet, Rfl: 0 .  levothyroxine (SYNTHROID) 150 MCG tablet, Take 1 tablet (150 mcg total) by mouth daily., Disp: 90 tablet, Rfl: 1 .  Multiple Vitamin (MULTIVITAMIN WITH MINERALS) TABS tablet, Take 1 tablet by mouth daily., Disp: , Rfl:  .  Omega-3 Fatty Acids (FISH OIL) 1000 MG CAPS, Take 2,000 mg by mouth daily., Disp: , Rfl:  .  PARoxetine (PAXIL) 20 MG tablet, Take 1 tablet (20 mg total) by mouth daily., Disp: 90 tablet, Rfl: 0 .  QUEtiapine (SEROQUEL) 100 MG tablet, Take 1 tablet (100 mg total) by mouth at bedtime., Disp: 90 tablet, Rfl: 0  Review of Systems:  Constitutional: Denies fever, chills, diaphoresis, appetite change.  HEENT: Denies photophobia, eye pain, redness, hearing loss, ear pain, congestion, sore throat, rhinorrhea, sneezing, mouth sores, trouble swallowing, neck pain, neck stiffness and tinnitus.   Respiratory: Denies SOB, DOE, cough, chest tightness,  and wheezing.   Cardiovascular: Denies chest pain, palpitations and leg swelling.  Gastrointestinal: Denies nausea, vomiting, abdominal pain, diarrhea, constipation, blood in stool and abdominal distention.  Genitourinary: Positive for dysuria,  urgency, frequency,  flank pain. Endocrine: Denies: hot or cold intolerance, sweats, changes in hair or nails, polyuria, polydipsia. Musculoskeletal: Denies myalgias, back pain, joint swelling, arthralgias and gait problem.  Skin: Denies pallor, rash and wound.  Neurological: Denies  seizures, syncope, weakness,  numbness and headaches.  Hematological: Denies adenopathy. Easy bruising, personal or family bleeding history  Psychiatric/Behavioral: Denies suicidal ideation, mood changes, confusion, nervousness, sleep disturbance and agitation    Physical Exam: Vitals:   02/26/20 1058  BP: 110/80  Pulse: 77  Temp: 97.8 F (36.6 C)  TempSrc: Temporal  SpO2: 99%  Weight: 196 lb 11.2 oz (89.2 kg)    Body mass index is 29.91 kg/m.   Constitutional: NAD, calm, comfortable Eyes: PERRL, lids and conjunctivae normal ENMT: Mucous membranes are moist.  Neurologic: Grossly intact and nonfocal Psychiatric: Normal judgment and insight. Alert and oriented x 3. Normal mood.    Impression and Plan:  Left flank pain/urinary symptoms -Urine dipstick is again negative in office today. -I will not give her antibiotics today despite her presence of symptoms, will send for UA and culture. -Given addition of left-sided flank pain I will go ahead and send for renal ultrasound to make sure she does not have nephrolithiasis or an obstructing stone as the cause of her issues.    Patient Instructions  -Nice seeing you today!!  -Will send you for a kidney US.     Lelon Frohlich, MD [facility]

## 2020-02-26 NOTE — Addendum Note (Signed)
Addended by: Elmer Picker on: 02/26/2020 12:25 PM   Modules accepted: Orders

## 2020-02-26 NOTE — Patient Instructions (Signed)
-  Nice seeing you today!!  -Will send you for a kidney US.

## 2020-02-27 LAB — URINE CULTURE
MICRO NUMBER:: 10544047
SPECIMEN QUALITY:: ADEQUATE

## 2020-03-18 ENCOUNTER — Ambulatory Visit
Admission: RE | Admit: 2020-03-18 | Discharge: 2020-03-18 | Disposition: A | Discharge status: Home / Self Care | Payer: Medicare Other | Source: Ambulatory Visit | Attending: Internal Medicine | Admitting: Internal Medicine

## 2020-03-18 DIAGNOSIS — R109 Unspecified abdominal pain: Secondary | ICD-10-CM

## 2020-03-20 ENCOUNTER — Encounter: Payer: Self-pay | Admitting: Internal Medicine

## 2020-03-23 ENCOUNTER — Encounter: Payer: Self-pay | Admitting: Internal Medicine

## 2020-03-25 DIAGNOSIS — T56891A Toxic effect of other metals, accidental (unintentional), initial encounter: Secondary | ICD-10-CM | POA: Diagnosis not present

## 2020-03-25 DIAGNOSIS — R8281 Pyuria: Secondary | ICD-10-CM | POA: Diagnosis not present

## 2020-03-25 DIAGNOSIS — N183 Chronic kidney disease, stage 3 unspecified: Secondary | ICD-10-CM | POA: Diagnosis not present

## 2020-03-25 DIAGNOSIS — G5600 Carpal tunnel syndrome, unspecified upper limb: Secondary | ICD-10-CM | POA: Diagnosis not present

## 2020-03-25 DIAGNOSIS — N141 Nephropathy induced by other drugs, medicaments and biological substances: Secondary | ICD-10-CM | POA: Diagnosis not present

## 2020-03-25 DIAGNOSIS — F319 Bipolar disorder, unspecified: Secondary | ICD-10-CM | POA: Diagnosis not present

## 2020-03-25 DIAGNOSIS — T43595S Adverse effect of other antipsychotics and neuroleptics, sequela: Secondary | ICD-10-CM | POA: Diagnosis not present

## 2020-04-03 ENCOUNTER — Encounter: Payer: Self-pay | Admitting: Internal Medicine

## 2020-04-03 ENCOUNTER — Telehealth (INDEPENDENT_AMBULATORY_CARE_PROVIDER_SITE_OTHER): Payer: 59 | Admitting: Internal Medicine

## 2020-04-03 DIAGNOSIS — R519 Headache, unspecified: Secondary | ICD-10-CM | POA: Diagnosis not present

## 2020-04-03 DIAGNOSIS — E039 Hypothyroidism, unspecified: Secondary | ICD-10-CM | POA: Diagnosis not present

## 2020-04-03 MED ORDER — LEVOTHYROXINE SODIUM 150 MCG PO TABS: 150.0000 ug | ORAL_TABLET | Freq: Every day | ORAL | 1 refills | Status: DC

## 2020-04-03 NOTE — Progress Notes (Signed)
am

## 2020-04-03 NOTE — Telephone Encounter (Signed)
Pt called in to see if she can get a MRI done since it is going to be October before she can get in to the Neurologist. She sent a mychart message and wanted to make sure that Dr. Jerilee Hoh will see it before the end of day .  Pt is aware that they will get the msg.

## 2020-04-03 NOTE — Progress Notes (Signed)
Virtual Visit via Video Note  I connected with Miranda Harris on 04/03/20 at  9:30 AM EDT by a video enabled telemedicine application and verified that I am speaking with the correct person using two identifiers.  Location patient: home Location provider: work office Persons participating in the virtual visit: patient, provider  I discussed the limitations of evaluation and management by telemedicine and the availability of in person appointments. The patient expressed understanding and agreed to proceed.   HPI: She has scheduled this visit for medication refills and to discuss continued issues with headaches.  1.  She has a history of hypothyroidism and is requesting refills on her levothyroxine today.  2.  She has been having headaches for about 6 months now.  She describes it as a constant pain behind her left eye and around all her forehead, sometimes on the right side.  About once a day, on top of the chronic pain, she will have a sharp stabbing pain in her left eye.  She denies blurry vision.  She saw her ophthalmologist and was told everything looked okay.  At the urging of a different provider, she went to see ENT thinking that it could be TMJ.  She was treated with prednisone for presumptive TMJ and this did not make any difference in her symptoms.  She feels like this pain never goes away "I cannot live like this anymore, I am concerned about a brain aneurysm or tumor".  She has tried Tylenol, Excedrin and even leftover OxyContin from a prior surgery all without success.  She does not have nausea or vomiting, no photophobia.  She feels like she might have had a presyncopal episode this past weekend.  She felt like her vision was darkening and she saw "stars".  Her husband sat her down on the couch but she had a quick recovery.   ROS: Constitutional: Denies fever, chills, diaphoresis, appetite change and fatigue.  HEENT: Denies photophobia, eye pain, redness, hearing loss, ear  pain, congestion, sore throat, rhinorrhea, sneezing, mouth sores, trouble swallowing, neck pain, neck stiffness and tinnitus.   Respiratory: Denies SOB, DOE, cough, chest tightness,  and wheezing.   Cardiovascular: Denies chest pain, palpitations and leg swelling.  Gastrointestinal: Denies nausea, vomiting, abdominal pain, diarrhea, constipation, blood in stool and abdominal distention.  Genitourinary: Denies dysuria, urgency, frequency, hematuria, flank pain and difficulty urinating.  Endocrine: Denies: hot or cold intolerance, sweats, changes in hair or nails, polyuria, polydipsia. Musculoskeletal: Denies myalgias, back pain, joint swelling, arthralgias and gait problem.  Skin: Denies pallor, rash and wound.  Neurological: Denies dizziness, seizures, syncope, weakness, light-headedness, numbness. Hematological: Denies adenopathy. Easy bruising, personal or family bleeding history  Psychiatric/Behavioral: Denies suicidal ideation, mood changes, confusion, nervousness, sleep disturbance and agitation   Past Medical History:  Diagnosis Date  . Bipolar disorder ([place])   . Elevated serum creatinine    related to lithium use  . Helicobacter pylori (H. pylori) infection    pt denies  . Hypothyroidism   . Perirectal fistula   . Thyroid disease   . Trochanteric bursitis of right hip     Past Surgical History:  Procedure Laterality Date  . EVALUATION UNDER ANESTHESIA WITH HEMORRHOIDECTOMY N/A 06/28/2017   Procedure: EXAM UNDER ANESTHESIA WITH EXTERNAL HEMORRHOIDECTOMY AND REPAIR OF PERIRECTAL FISTULA;  Surgeon: Michael Boston, MD;  Location: [facility];  Service: General;  Laterality: N/A;  . HERNIA REPAIR  2-35 yrs ago   umbilical hernia  . INCISION AND DRAINAGE  PERIRECTAL ABSCESS N/A 04/18/2017   Procedure: IRRIGATION AND DEBRIDEMENT PERIRECTAL ABSCESS, placement of seton, sigmoidoscopy;  Surgeon: Excell Seltzer, MD;  Location: [facility];  Service: General;  Laterality: N/A;    . LAPAROSCOPIC APPENDECTOMY    . VAGINAL HYSTERECTOMY Bilateral    complete  . WISDOM TOOTH EXTRACTION      Family History  Problem Relation Age of Onset  . Anxiety disorder Brother   . Breast cancer Neg Hx     SOCIAL HX:   reports that she has quit smoking. Her smoking use included cigarettes. She has a 30.00 pack-year smoking history. She has never used smokeless tobacco. She reports that she does not drink alcohol and does not use drugs.   Current Outpatient Medications:  .  Calcium Carbonate-Vitamin D (CALTRATE 600+D PO), Take 2 tablets by mouth daily., Disp: , Rfl:  .  lamoTRIgine (LAMICTAL) 25 MG tablet, Take 2 tablets (50 mg total) by mouth daily., Disp: 180 tablet, Rfl: 0 .  levothyroxine (SYNTHROID) 150 MCG tablet, Take 1 tablet (150 mcg total) by mouth daily., Disp: 90 tablet, Rfl: 1 .  Multiple Vitamin (MULTIVITAMIN WITH MINERALS) TABS tablet, Take 1 tablet by mouth daily., Disp: , Rfl:  .  Omega-3 Fatty Acids (FISH OIL) 1000 MG CAPS, Take 2,000 mg by mouth daily., Disp: , Rfl:  .  PARoxetine (PAXIL) 20 MG tablet, Take 1 tablet (20 mg total) by mouth daily., Disp: 90 tablet, Rfl: 0 .  QUEtiapine (SEROQUEL) 100 MG tablet, Take 1 tablet (100 mg total) by mouth at bedtime., Disp: 90 tablet, Rfl: 0  EXAM:   VITALS per patient if applicable: None reported  GENERAL: alert, oriented, appears well and in no acute distress  HEENT: atraumatic, conjunttiva clear, no obvious abnormalities on inspection of external nose and ears  NECK: normal movements of the head and neck  LUNGS: on inspection no signs of respiratory distress, breathing rate appears normal, no obvious gross increased work of breathing, gasping or wheezing  CV: no obvious cyanosis  MS: moves all visible extremities without noticeable abnormality  PSYCH/NEURO: pleasant and cooperative, no obvious depression or anxiety, speech and thought processing grossly intact  ASSESSMENT AND PLAN:   Frequent  headaches -At this point diagnosis is not clear to me. -Question of ocular migraines. -Do not believe temporal arteritis is an issue as she does not have visual disturbances and had no improvement while on prednisone for presumptive TMJ. -I believe brain tumor or brain aneurysm to be unlikely, she does not have any focal neurologic deficits. -Initiate referral to neurology, she is adamant about having an MRI I do not feel like this is the next best step.  If neurology feels like this is required then we can order it.  Adult hypothyroidism  - Plan: levothyroxine (SYNTHROID) 150 MCG tablet -TSH was 1.94 in March.     I discussed the assessment and treatment plan with the patient. The patient was provided an opportunity to ask questions and all were answered. The patient agreed with the plan and demonstrated an understanding of the instructions.   The patient was advised to call back or seek an in-person evaluation if the symptoms worsen or if the condition fails to improve as anticipated.    Lelon Frohlich, MD  [facility] at [facility]

## 2020-04-08 ENCOUNTER — Telehealth: Payer: Medicare Other | Admitting: Internal Medicine

## 2020-04-08 ENCOUNTER — Ambulatory Visit (INDEPENDENT_AMBULATORY_CARE_PROVIDER_SITE_OTHER): Payer: 59 | Admitting: Neurology

## 2020-04-08 ENCOUNTER — Encounter: Payer: Self-pay | Admitting: Neurology

## 2020-04-08 ENCOUNTER — Other Ambulatory Visit: Payer: Self-pay

## 2020-04-08 VITALS — BP 122/84 | HR 76 | Ht 68.0 in | Wt 198.0 lb

## 2020-04-08 DIAGNOSIS — R519 Headache, unspecified: Secondary | ICD-10-CM | POA: Diagnosis not present

## 2020-04-08 DIAGNOSIS — G4700 Insomnia, unspecified: Secondary | ICD-10-CM

## 2020-04-08 DIAGNOSIS — G4489 Other headache syndrome: Secondary | ICD-10-CM | POA: Diagnosis not present

## 2020-04-08 DIAGNOSIS — G479 Sleep disorder, unspecified: Secondary | ICD-10-CM

## 2020-04-08 NOTE — Progress Notes (Signed)
Subjective:    Patient ID: Miranda Harris is a 59 y.o. female.  HPI     Miranda Age, MD, PhD [facility] [address] [place], [place] [address]  Dear Dr. Isaac Harris,   I saw your patient, Miranda Harris, upon your kind request, in my Neurologic clinic today for initial consultation of her recurrent headaches.  The patient is unaccompanied today.  As you know, Miranda Harris is a 59 year old right-handed woman with an underlying medical history of hypothyroidism, bipolar disease, history of elevated creatinine level, bursitis of the right hip and borderline obesity, who reports recurrent headaches for the past 6 months.  I reviewed your virtual visit note from 04/03/2020. The headaches are on the left side, behind the left eye and in the left forehead area, sometimes bifrontal and sometimes behind the right eye but mostly confined to the left side, nonthrobbing, more like a achy sensation.  She has tried over-the-counter Tylenol and Advil and even leftover narcotic pain medication she had from when she had carpal tunnel surgery, none of these helped.  She has woken up with a headache.  She denies snoring.  She has a history of sleep disturbance, difficulty falling asleep and maintaining sleep for years.  She used to be on Xanax for sleep.  She weaned herself off of it due to side effects.  She had a sleep study some 6 years ago which did not show any REM sleep per her report, no sleep apnea was diagnosed.  Her husband has sleep apnea and uses a CPAP machine.  She denies a family history of sleep apnea.  She has nocturia once per average night, goes to bed around 10 or 1030 and rise time is between 730 and 8.  She has not worked for the past year.  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  She drinks caffeine in the form of coffee, 1 cup up to 2 times a day.  She tries to hydrate with water.  She has not had any sudden onset of one-sided weakness  or numbness or droopy face or slurring of speech or visual symptoms such as double vision or blurry vision.  She had an eye examination in March with ophthalmology with benign findings per her report.  A CT scan was ordered a few months ago but she never had it done.  She denies any associated nausea or vomiting or photophobia.  Her Past Medical History Is Significant For: Past Medical History:  Diagnosis Date  . Bipolar disorder ([place])   . Elevated serum creatinine    related to lithium use  . Helicobacter pylori (H. pylori) infection    pt denies  . Hypothyroidism   . Perirectal fistula   . Thyroid disease   . Trochanteric bursitis of right hip     Her Past Surgical History Is Significant For: Past Surgical History:  Procedure Laterality Date  . EVALUATION UNDER ANESTHESIA WITH HEMORRHOIDECTOMY N/A 06/28/2017   Procedure: EXAM UNDER ANESTHESIA WITH EXTERNAL HEMORRHOIDECTOMY AND REPAIR OF PERIRECTAL FISTULA;  Surgeon: Miranda Boston, MD;  Location: [facility];  Service: General;  Laterality: N/A;  . HERNIA REPAIR  6-04 yrs ago   umbilical hernia  . INCISION AND DRAINAGE PERIRECTAL ABSCESS N/A 04/18/2017   Procedure: IRRIGATION AND DEBRIDEMENT PERIRECTAL ABSCESS, placement of seton, sigmoidoscopy;  Surgeon: Miranda Seltzer, MD;  Location: [facility];  Service: General;  Laterality: N/A;  . LAPAROSCOPIC APPENDECTOMY    . VAGINAL HYSTERECTOMY  Bilateral    complete  . WISDOM TOOTH EXTRACTION      Her Family History Is Significant For: Family History  Problem Relation Harris of Onset  . Anxiety disorder Brother   . Breast cancer Neg Hx     Her Social History Is Significant For: Social History   Socioeconomic History  . Marital status: Married    Spouse name: Not on file  . Number of children: Not on file  . Years of education: Not on file  . Highest education level: Not on file  Occupational History  . Not on file  Tobacco Use  . Smoking status: Former Smoker     Packs/day: 1.00    Years: 30.00    Pack years: 30.00    Types: Cigarettes  . Smokeless tobacco: Never Used  . Tobacco comment: quit 2008  Vaping Use  . Vaping Use: Never used  Substance and Sexual Activity  . Alcohol use: No    Alcohol/week: 0.0 standard drinks    Comment: Occasional use  . Drug use: No  . Sexual activity: Yes    Partners: Male    Birth control/protection: Surgical  Other Topics Concern  . Not on file  Social History Narrative  . Not on file   Social Determinants of Health   Financial Resource Strain:   . Difficulty of Paying Living Expenses:   Food Insecurity:   . Worried About Charity fundraiser in the Last Year:   . Arboriculturist in the Last Year:   Transportation Needs:   . Film/video editor (Medical):   [name] Lack of Transportation (Non-Medical):   Physical Activity:   . Days of Exercise per Week:   . Minutes of Exercise per Session:   Stress:   . Feeling of Stress :   Social Connections:   . Frequency of Communication with Friends and Family:   . Frequency of Social Gatherings with Friends and Family:   . Attends Religious Services:   . Active Member of Clubs or Organizations:   . Attends Archivist Meetings:   [name] Marital Status:     Her Allergies Are:  No Known Allergies:   Her Current Medications Are:  Outpatient Encounter Medications as of 04/08/2020  Medication Sig  . Calcium Carbonate-Vitamin D (CALTRATE 600+D PO) Take 2 tablets by mouth daily.  [name] lamoTRIgine (LAMICTAL) 25 MG tablet Take 2 tablets (50 mg total) by mouth daily.  [name] levothyroxine (SYNTHROID) 150 MCG tablet Take 1 tablet (150 mcg total) by mouth daily.  . Multiple Vitamin (MULTIVITAMIN WITH MINERALS) TABS tablet Take 1 tablet by mouth daily.  . Omega-3 Fatty Acids (FISH OIL) 1000 MG CAPS Take 2,000 mg by mouth daily.  [name] PARoxetine (PAXIL) 20 MG tablet Take 1 tablet (20 mg total) by mouth daily.  . QUEtiapine (SEROQUEL) 100 MG tablet Take 1 tablet (100 mg  total) by mouth at bedtime.   No facility-administered encounter medications on file as of 04/08/2020.  : Review of Systems:  Out of a complete 14 point review of systems, all are reviewed and negative with the exception of these symptoms as listed below:  Review of Systems  Neurological:       Here to discuss h/a. Pt reports over the last 6 months a constant daily h/a.  Pt sts she has tried tylenol, Excedrin and left over pain meds from previous wrist procedure. Pt reports these meds have not helped.  Epworth Sleepiness Scale 0= would never  doze 1= slight chance of dozing 2= moderate chance of dozing 3= high chance of dozing  Sitting and reading:0 Watching TV:0 Sitting inactive in a public place (ex. Theater or meeting):1 As a passenger in a car for an hour without a break:1 Lying down to rest in the afternoon:1 Sitting and talking to someone:0 Sitting quietly after lunch (no alcohol):0 In a car, while stopped in traffic:0 Total:3     Objective:  Neurological Exam  Physical Exam Physical Examination:   Vitals:   04/08/20 1258  BP: 122/84  Pulse: 76  SpO2: 97%    General Examination: The patient is a very pleasant 59 y.o. female in no acute distress. She appears well-developed and well-nourished and well groomed.   HEENT: Normocephalic, atraumatic, pupils are equal, round and reactive to light and accommodation. Funduscopic exam is normal with sharp disc margins noted.  Status post bilateral cataract extractions.  She denies any photophobia and does not appear to be sensitive to light even with funduscopic testing but frequently keeps her eyes closed and seems to move her eyes around with her eyes closed.  She has no temporal tenderness, no sensitivity to touch in the left forehead area, no palpable cord. Extraocular tracking is good without limitation to gaze excursion or nystagmus noted. Normal smooth pursuit is noted. Hearing is grossly intact. Face is symmetric with  normal facial animation and normal facial sensation. Speech is clear with no dysarthria noted. There is no hypophonia. There is no lip, neck/head, jaw or voice tremor. Neck is supple with full range of passive and active motion. There are no carotid bruits on auscultation. Oropharynx exam reveals: moderate mouth dryness, adequate dental hygiene and moderate airway crowding, due to redundant soft palate and longer uvula.  Tonsils appear to be absent.  Mallampati is class II.  Neck circumference is 14-1/4 inches.  Tongue protrudes centrally and palate elevates symmetrically.  Chest: Clear to auscultation without wheezing, rhonchi or crackles noted.  Heart: S1+S2+0, regular and normal without murmurs, rubs or gallops noted.   Abdomen: Soft, non-tender and non-distended with normal bowel sounds appreciated on auscultation.  Extremities: There is no pitting edema in the distal lower extremities bilaterally. Pedal pulses are intact.  Skin: Warm and dry without trophic changes noted.  Musculoskeletal: exam reveals no obvious joint deformities, tenderness or joint swelling or erythema.   Neurologically:  Mental status: The patient is awake, alert and oriented in all 4 spheres. Her immediate and remote memory, attention, language skills and fund of knowledge are appropriate. There is no evidence of aphasia, agnosia, apraxia or anomia. Speech is clear with normal prosody and enunciation. Thought process is linear. Mood is normal and affect is normal.  Cranial nerves II - XII are as described above under HEENT exam. In addition: shoulder shrug is normal with equal shoulder height noted. Motor exam: Normal bulk, strength and tone is noted. There is no drift, tremor or rebound. Romberg is negative. Reflexes are 2+ throughout. Babinski: Toes are flexor bilaterally. Fine motor skills and coordination: intact with normal finger taps, normal hand movements, normal rapid alternating patting, normal foot taps and  normal foot agility.  Cerebellar testing: No dysmetria or intention tremor on finger to nose testing. Heel to shin is unremarkable bilaterally. There is no truncal or gait ataxia.  Sensory exam: intact to light touch, vibration, temperature sense in the upper and lower extremities.  Gait, station and balance: She stands easily. No veering to one side is noted. No leaning to one  side is noted. Posture is Harris-appropriate and stance is narrow based. Gait shows normal stride length and normal pace. No problems turning are noted. Tandem walk is challenging at first but doable after 2 trials.   Assessment and Plan:   In summary, Jasenia Weilbacher is a very pleasant 59 y.o.-year old female with an underlying medical history of hypothyroidism, bipolar disease, history of elevated creatinine level, bursitis of the right hip and borderline obesity, who presents for evaluation of her recurrent headaches of 6 months duration.  Differential diagnosis includes migraine headaches, tension type headache, temporal arteritis, headache secondary to underlying obstructive sleep apnea, blood pressure fluctuations.  Her presentation is unusual for migraine headaches.  I would like to proceed with testing in the form of lab work, ESR, CRP and rheumatological labs, as well as brain MRI with and without contrast to rule out a structural cause of her symptoms.  If her kidney function is not stable good enough, we may have to do the brain MRI without contrast only.  We will check CMP for that reason as well.  She is agreeable to scheduling the brain MRI exam doing blood work today, we will call her with results by phone call.  We will arrange for follow-up afterwards.  If she changes her mind regarding sleep testing, she is encouraged to call our office anytime or email through [facility].  For now, she is advised to use over-the-counter medication but avoid daily pain medication including over-the-counter medications if possible, she is  advised to stay well rested and well-hydrated with water.  She typically limits her caffeine.  She denies any new stressors or new medications.  We may consider migraine medication especially preventative medication, we will await initial test results for now.  Again, ruling out obstructive sleep apnea would be part of the work-up for new onset headaches and daily headaches.  I answered all her questions today and she was in agreement with the plan to proceed with blood work and scanning, she is currently not ready to proceed with sleep evaluation.  Thank you very much for allowing me to participate in the care of this nice patient. If I can be of any further assistance to you please do not hesitate to call me at [phone].  Sincerely,   Miranda Age, MD, PhD

## 2020-04-08 NOTE — Patient Instructions (Signed)
Your history and presentation is not classic for migraine headaches, even her ocular migraines.  You can continue to use as needed Tylenol and alternate with Advil or Motrin.  Try to avoid taking daily pain medication including over-the-counter pain medication.  It is not impossible for [place] to try migraine medication but for now I would like to exclude an underlying treatable cause for your headaches.  There are some inflammatory conditions that can cause headaches, would like to exclude obstructive sleep apnea as a potential cause for recurrent headaches.  Untreated sleep apnea can cause recurrent headaches, daytime tiredness, sleep disruption, and morning headaches.  If you change regarding the sleep study, please call us back anytime.  For now, we will proceed with a blood work today.  If your kidney function is not good enough to proceed with a brain MRI with contrast, we will change the order to a brain MRI without contrast.  The brain MRI will help look for an underlying structural cause of your new onset headaches.  Thankfully, your neurological exam is normal.  We will keep you posted as to your blood test results and scan results.

## 2020-04-09 ENCOUNTER — Telehealth: Payer: Self-pay

## 2020-04-09 ENCOUNTER — Telehealth: Payer: Self-pay | Admitting: Neurology

## 2020-04-09 NOTE — Telephone Encounter (Signed)
-----   Message from Star Age, MD sent at 04/09/2020  7:46 AM EDT ----- Please call patient and advise her that her kidney function is impaired, as discussed, we would have to do the brain MRI without contrast, I will change the order to MRI brain without contrast only.  As I recall, she is followed by nephrology. Other labs are unremarkable, 1 test is pending, we will update if it is abnormal.

## 2020-04-09 NOTE — Telephone Encounter (Signed)
Medicare/[employer] Auth: [id number] (exp. 04/09/20 to 05/24/20) order sent to GI. They will reach out to the patient to schedule.

## 2020-04-09 NOTE — Telephone Encounter (Signed)
I called the pt and left a voicemail advising of the results ( ok per dpr).  Pt was advised to call back if she had any questions.

## 2020-04-09 NOTE — Addendum Note (Signed)
Addended by: Star Age on: 04/09/2020 07:48 AM   Modules accepted: Orders

## 2020-04-09 NOTE — Progress Notes (Signed)
Please call patient and advise her that her kidney function is impaired, as discussed, we would have to do the brain MRI without contrast, I will change the order to MRI brain without contrast only.  As I recall, she is followed by nephrology. Other labs are unremarkable, 1 test is pending, we will update if it is abnormal.

## 2020-04-11 LAB — COMPREHENSIVE METABOLIC PANEL
ALT: 15 IU/L (ref 0–32)
AST: 23 IU/L (ref 0–40)
Albumin/Globulin Ratio: 2 (ref 1.2–2.2)
Albumin: 4.3 g/dL (ref 3.8–4.9)
Alkaline Phosphatase: 107 IU/L (ref 48–121)
BUN/Creatinine Ratio: 19 (ref 12–28)
BUN: 29 mg/dL — ABNORMAL HIGH (ref 8–27)
Bilirubin Total: <0.2 mg/dL (ref 0.0–1.2)
CO2: 21 mmol/L (ref 20–29)
Calcium: 10 mg/dL (ref 8.7–10.3)
Chloride: 107 mmol/L — ABNORMAL HIGH (ref 96–106)
Creatinine, Ser: 1.51 mg/dL — ABNORMAL HIGH (ref 0.57–1.00)
GFR calc Af Amer: 43 mL/min/{1.73_m2} — ABNORMAL LOW (ref >59)
GFR calc non Af Amer: 37 mL/min/{1.73_m2} — ABNORMAL LOW (ref >59)
Globulin, Total: 2.1 g/dL (ref 1.5–4.5)
Glucose: 89 mg/dL (ref 65–99)
Potassium: 4.7 mmol/L (ref 3.5–5.2)
Sodium: 140 mmol/L (ref 134–144)
Total Protein: 6.4 g/dL (ref 6.0–8.5)

## 2020-04-11 LAB — ENA+DNA/DS+SJORGEN'S
ENA RNP Ab: 3.4 AI — ABNORMAL HIGH (ref 0.0–0.9)
ENA SM Ab Ser-aCnc: <0.2 AI (ref 0.0–0.9)
ENA SSA (RO) Ab: <0.2 AI (ref 0.0–0.9)
ENA SSB (LA) Ab: <0.2 AI (ref 0.0–0.9)
dsDNA Ab: <1 IU/mL (ref 0–9)

## 2020-04-11 LAB — ANA W/REFLEX: Anti Nuclear Antibody (ANA): POSITIVE — AB

## 2020-04-11 LAB — SEDIMENTATION RATE: Sed Rate: 3 mm/hr (ref 0–40)

## 2020-04-11 LAB — RHEUMATOID FACTOR: Rheumatoid fact SerPl-aCnc: <10 IU/mL (ref 0.0–13.9)

## 2020-04-11 LAB — C-REACTIVE PROTEIN: CRP: 1 mg/L (ref 0–10)

## 2020-04-14 ENCOUNTER — Telehealth: Payer: Self-pay

## 2020-04-14 DIAGNOSIS — R519 Headache, unspecified: Secondary | ICD-10-CM

## 2020-04-14 DIAGNOSIS — R768 Other specified abnormal immunological findings in serum: Secondary | ICD-10-CM

## 2020-04-14 NOTE — Telephone Encounter (Signed)
-----   Message from Star Age, MD sent at 04/14/2020  8:51 AM EDT ----- Please call patient regarding her blood test result, one of the autoimmune markers called ANA was positive, no other obvious inflammatory markers were positive.  Nevertheless, this can be seen in conditions of rheumatological origin, autoimmune diseases.  I am not sure how it ties in with her current symptoms but it may be worthwhile getting a rheumatological opinion and see if additional testing or monitoring is recommended.  To that end, I would recommend a rheumatology consult.  If she is agreeable, please place a consult request for rheumatology for positive ANA and ENA, in the context of new onset headaches. From our end, we will proceed with the brain MRI without contrast.  Also remind patient that if she has decided to proceed with sleep study evaluation, I can also put an order in for sleep study testing as recommended.

## 2020-04-14 NOTE — Telephone Encounter (Signed)
I called the pt and advised of results. Pt is agreeable with Rheumatology referral and will keep MRI as scheduled for 04/20/2020. Pt declines sleep study at this time reports she does not feel tired/fatigue throughout the day. She will let us know if she changes her mind.   Referral for Rheumatology has been placed.

## 2020-04-14 NOTE — Progress Notes (Signed)
Please call patient regarding her blood test result, one of the autoimmune markers called ANA was positive, no other obvious inflammatory markers were positive.  Nevertheless, this can be seen in conditions of rheumatological origin, autoimmune diseases.  I am not sure how it ties in with her current symptoms but it may be worthwhile getting a rheumatological opinion and see if additional testing or monitoring is recommended.  To that end, I would recommend a rheumatology consult.  If she is agreeable, please place a consult request for rheumatology for positive ANA and ENA, in the context of new onset headaches. From our end, we will proceed with the brain MRI without contrast.  Also remind patient that if she has decided to proceed with sleep study evaluation, I can also put an order in for sleep study testing as recommended.

## 2020-04-20 ENCOUNTER — Other Ambulatory Visit: Payer: Self-pay

## 2020-04-20 ENCOUNTER — Ambulatory Visit
Admission: RE | Admit: 2020-04-20 | Discharge: 2020-04-20 | Disposition: A | Discharge status: Home / Self Care | Payer: Medicare Other | Source: Ambulatory Visit | Attending: Neurology | Admitting: Neurology

## 2020-04-20 DIAGNOSIS — R519 Headache, unspecified: Secondary | ICD-10-CM

## 2020-04-20 DIAGNOSIS — G4489 Other headache syndrome: Secondary | ICD-10-CM

## 2020-04-20 DIAGNOSIS — G4700 Insomnia, unspecified: Secondary | ICD-10-CM

## 2020-04-20 NOTE — Telephone Encounter (Signed)
Patient is scheduled at GI for 04/20/20.

## 2020-04-22 NOTE — Progress Notes (Deleted)
BH MD/PA/NP OP Progress Note  04/22/2020 2:38 PM Albertina Leise  MRN:  [id number]  Chief Complaint:  HPI: *** Visit Diagnosis: No diagnosis found.  Past Psychiatric History: Please see initial evaluation for full details. I have reviewed the history. No updates at this time.     Past Medical History:  Past Medical History:  Diagnosis Date  . Bipolar disorder ([place])   . Elevated serum creatinine    related to lithium use  . Helicobacter pylori (H. pylori) infection    pt denies  . Hypothyroidism   . Perirectal fistula   . Thyroid disease   . Trochanteric bursitis of right hip     Past Surgical History:  Procedure Laterality Date  . EVALUATION UNDER ANESTHESIA WITH HEMORRHOIDECTOMY N/A 06/28/2017   Procedure: EXAM UNDER ANESTHESIA WITH EXTERNAL HEMORRHOIDECTOMY AND REPAIR OF PERIRECTAL FISTULA;  Surgeon: Michael Boston, MD;  Location: [facility];  Service: General;  Laterality: N/A;  . HERNIA REPAIR  4-65 yrs ago   umbilical hernia  . INCISION AND DRAINAGE PERIRECTAL ABSCESS N/A 04/18/2017   Procedure: IRRIGATION AND DEBRIDEMENT PERIRECTAL ABSCESS, placement of seton, sigmoidoscopy;  Surgeon: Excell Seltzer, MD;  Location: [facility];  Service: General;  Laterality: N/A;  . LAPAROSCOPIC APPENDECTOMY    . VAGINAL HYSTERECTOMY Bilateral    complete  . WISDOM TOOTH EXTRACTION      Family Psychiatric History: Please see initial evaluation for full details. I have reviewed the history. No updates at this time.     Family History:  Family History  Problem Relation Age of Onset  . Anxiety disorder Brother   . Breast cancer Neg Hx     Social History:  Social History   Socioeconomic History  . Marital status: Married    Spouse name: Not on file  . Number of children: Not on file  . Years of education: Not on file  . Highest education level: Not on file  Occupational History  . Not on file  Tobacco Use  . Smoking status: Former Smoker    Packs/day:  1.00    Years: 30.00    Pack years: 30.00    Types: Cigarettes  . Smokeless tobacco: Never Used  . Tobacco comment: quit 2008  Vaping Use  . Vaping Use: Never used  Substance and Sexual Activity  . Alcohol use: No    Alcohol/week: 0.0 standard drinks    Comment: Occasional use  . Drug use: No  . Sexual activity: Yes    Partners: Male    Birth control/protection: Surgical  Other Topics Concern  . Not on file  Social History Narrative  . Not on file   Social Determinants of Health   Financial Resource Strain:   . Difficulty of Paying Living Expenses:   Food Insecurity:   . Worried About Charity fundraiser in the Last Year:   . Arboriculturist in the Last Year:   Transportation Needs:   . Film/video editor (Medical):   [name] Lack of Transportation (Non-Medical):   Physical Activity:   . Days of Exercise per Week:   . Minutes of Exercise per Session:   Stress:   . Feeling of Stress :   Social Connections:   . Frequency of Communication with Friends and Family:   . Frequency of Social Gatherings with Friends and Family:   . Attends Religious Services:   . Active Member of Clubs or Organizations:   . Attends Archivist Meetings:   .  Marital Status:     Allergies: No Known Allergies  Metabolic Disorder Labs: Lab Results  Component Value Date   HGBA1C 5.4 06/10/2015   MPG 108 06/10/2015   No results found for: PROLACTIN Lab Results  Component Value Date   CHOL 208 (H) 11/30/2018   TRIG 116.0 11/30/2018   HDL 55.00 11/30/2018   CHOLHDL 4 11/30/2018   VLDL 23.2 11/30/2018   LDLCALC 130 (H) 11/30/2018   Lab Results  Component Value Date   TSH 1.94 11/29/2019   TSH 1.48 05/22/2019    Therapeutic Level Labs: Lab Results  Component Value Date   LITHIUM 1.2 11/08/2018   LITHIUM 0.80 06/10/2015   No results found for: VALPROATE No components found for:  CBMZ  Current Medications: Current Outpatient Medications  Medication Sig Dispense Refill   . Calcium Carbonate-Vitamin D (CALTRATE 600+D PO) Take 2 tablets by mouth daily.    [name] lamoTRIgine (LAMICTAL) 25 MG tablet Take 2 tablets (50 mg total) by mouth daily. 180 tablet 0  . levothyroxine (SYNTHROID) 150 MCG tablet Take 1 tablet (150 mcg total) by mouth daily. 90 tablet 1  . Multiple Vitamin (MULTIVITAMIN WITH MINERALS) TABS tablet Take 1 tablet by mouth daily.    . Omega-3 Fatty Acids (FISH OIL) 1000 MG CAPS Take 2,000 mg by mouth daily.    [name] PARoxetine (PAXIL) 20 MG tablet Take 1 tablet (20 mg total) by mouth daily. 90 tablet 0  . QUEtiapine (SEROQUEL) 100 MG tablet Take 1 tablet (100 mg total) by mouth at bedtime. 90 tablet 0   No current facility-administered medications for this visit.     Musculoskeletal: Strength & Muscle Tone: N/A Gait & Station: N/A Patient leans: N/A  Psychiatric Specialty Exam: Review of Systems  There were no vitals taken for this visit.There is no height or weight on file to calculate BMI.  General Appearance: {Appearance:22683}  Eye Contact:  {BHH EYE CONTACT:22684}  Speech:  Clear and Coherent  Volume:  Normal  Mood:  {BHH MOOD:22306}  Affect:  {Affect (PAA):22687}  Thought Process:  Coherent  Orientation:  Full (Time, Place, and Person)  Thought Content: Logical   Suicidal Thoughts:  {ST/HT (PAA):22692}  Homicidal Thoughts:  {ST/HT (PAA):22692}  Memory:  Immediate;   Good  Judgement:  {Judgement (PAA):22694}  Insight:  {Insight (PAA):22695}  Psychomotor Activity:  Normal  Concentration:  Concentration: Good and Attention Span: Good  Recall:  Good  Fund of Knowledge: Good  Language: Good  Akathisia:  No  Handed:  Right  AIMS (if indicated): not done  Assets:  Communication Skills Desire for Improvement  ADL's:  Intact  Cognition: WNL  Sleep:  {BHH GOOD/FAIR/POOR:22877}   Screenings: PHQ2-9     Office Visit from 12/13/2019 in [place] at [facility] from 11/08/2018 in [place] at [facility] Total Score 0 0  PHQ-9 Total Score 6 --       Assessment and Plan:  Genesia Caslin is a 59 y.o. 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     # Bipolar I disorder She denies significant mood symptoms since her last visit despite tapering down quetiapine.  We will continue current medication regimen.  Will continue lamotrigine for bipolar disorder.  Discussed risk of Stevens-Johnson syndrome.  Will continue quetiapine for bipolar disorder.  Discussed risk of potential metabolic side  effect.  Will continue Paxil for bipolar disorder.   Plan:   1. Continue Paxil 20 mgat night  2. Continue lamotrigine 50 mg daily  3.Continue quetiapine 100 mg at night (tapered down from 150 mg since the last visit) 4.Next appointment: 8/3 at 9:10 for 20 mins, video  Past trials of medication: Sertraline,fluoxetine (drowsiness),Lexapro,Duloxetine,venlafaxine (initially worked well, stopped working, hair loss),Wellbutrin(insomnia),Latuda,Abilify (flu like symptoms), Haldol (EPS), Thorazine (EPS), Stelazine, Lithium, Lunesta (sedation), Ambien (sleepwalking, driving, eating), Trazodone (excessive sedation), Xanax  The patient demonstrates the following risk factors for suicide: Chronic risk factorsfor suicide include psychiatric disorder /bipolar disorder,history of substance use disorder, family history of suicide attempt. Acute risk factorsfor suicide includenone.Protective factorsfor this patient include positive social support, responsibility to others (family), coping skills, hope for the future. Although patient reports passive SI, she adamantly denies any intent and has had no previous suicide attempt.Considering these factors, the overall suicide risk at this point appears to be low.  [name], MD 04/22/2020, 2:38 PM

## 2020-04-28 ENCOUNTER — Telehealth (HOSPITAL_COMMUNITY): Payer: Medicare Other | Admitting: Psychiatry

## 2020-04-30 ENCOUNTER — Other Ambulatory Visit (HOSPITAL_COMMUNITY): Payer: Self-pay | Admitting: Psychiatry

## 2020-04-30 DIAGNOSIS — F319 Bipolar disorder, unspecified: Secondary | ICD-10-CM

## 2020-04-30 MED ORDER — QUETIAPINE FUMARATE 100 MG PO TABS: 100.0000 mg | ORAL_TABLET | Freq: Every day | ORAL | 0 refills | Status: DC

## 2020-04-30 MED ORDER — PAROXETINE HCL 20 MG PO TABS: 20.0000 mg | ORAL_TABLET | Freq: Every day | ORAL | 0 refills | Status: DC

## 2020-04-30 MED ORDER — LAMOTRIGINE 25 MG PO TABS: 50.0000 mg | ORAL_TABLET | Freq: Every day | ORAL | 0 refills | Status: DC

## 2020-05-18 NOTE — Progress Notes (Signed)
Virtual Visit via Video Note  I connected with Miranda Harris on 05/21/20 at 12:00 PM EDT by a video enabled telemedicine application and verified that I am speaking with the correct person using two identifiers.   I discussed the limitations of evaluation and management by telemedicine and the availability of in person appointments. The patient expressed understanding and agreed to proceed.    I discussed the assessment and treatment plan with the patient. The patient was provided an opportunity to ask questions and all were answered. The patient agreed with the plan and demonstrated an understanding of the instructions.   The patient was advised to call back or seek an in-person evaluation if the symptoms worsen or if the condition fails to improve as anticipated.  Location: patient- home, provider- office   I provided 13 minutes of non-face-to-face time during this encounter.   [name], MD    [facility] MD/PA/NP OP Progress Note  05/21/2020 12:11 PM Miranda Harris  MRN:  [id number]  Chief Complaint:  Chief Complaint    Follow-up; Other     HPI:  This is a follow-up appointment for bipolar disorder.  She states that she has started to work at the [employer].  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  She states that they have good management, and she has supportive staff.  Although she was initially overwhelmed, she has been able to work at moderate pace.  She has started to work on quilt now that sewing machine is fixed. She has half acre of yard, and has been able to work on yard work.  She reports good relationship with her husband.  She believes lower dose of quetiapine works well for her, stating that she sleeps better, and does not feel anxious as much compared to before.  She has good energy.  She denies feeling depressed.  She has good concentration.  She has good appetite.  She denies any weight change.  She denies SI.  She denies decreased need for sleep or  euphoria. She denies irritability.    185 lbs Wt Readings from Last 3 Encounters:  04/08/20 198 lb (89.8 kg)  02/26/20 196 lb 11.2 oz (89.2 kg)  02/19/20 185 lb (83.9 kg)    Visit Diagnosis:    ICD-10-CM   1. Bipolar 1 disorder ([place])  F31.9     Past Psychiatric History: Please see initial evaluation for full details. I have reviewed the history. No updates at this time.     Past Medical History:  Past Medical History:  Diagnosis Date  . Bipolar disorder ([place])   . Elevated serum creatinine    related to lithium use  . Helicobacter pylori (H. pylori) infection    pt denies  . Hypothyroidism   . Perirectal fistula   . Thyroid disease   . Trochanteric bursitis of right hip     Past Surgical History:  Procedure Laterality Date  . EVALUATION UNDER ANESTHESIA WITH HEMORRHOIDECTOMY N/A 06/28/2017   Procedure: EXAM UNDER ANESTHESIA WITH EXTERNAL HEMORRHOIDECTOMY AND REPAIR OF PERIRECTAL FISTULA;  Surgeon: Michael Boston, MD;  Location: [facility];  Service: General;  Laterality: N/A;  . HERNIA REPAIR  4-16 yrs ago   umbilical hernia  . INCISION AND DRAINAGE PERIRECTAL ABSCESS N/A 04/18/2017   Procedure: IRRIGATION AND DEBRIDEMENT PERIRECTAL ABSCESS, placement of seton, sigmoidoscopy;  Surgeon: Excell Seltzer, MD;  Location: [facility];  Service: General;  Laterality: N/A;  . LAPAROSCOPIC APPENDECTOMY    . VAGINAL HYSTERECTOMY  Bilateral    complete  . WISDOM TOOTH EXTRACTION      Family Psychiatric History: Please see initial evaluation for full details. I have reviewed the history. No updates at this time.     Family History:  Family History  Problem Relation Age of Onset  . Anxiety disorder Brother   . Breast cancer Neg Hx     Social History:  Social History   Socioeconomic History  . Marital status: Married    Spouse name: Not on file  . Number of children: Not on file  . Years of education: Not on file  . Highest education level: Not on file   Occupational History  . Not on file  Tobacco Use  . Smoking status: Former Smoker    Packs/day: 1.00    Years: 30.00    Pack years: 30.00    Types: Cigarettes  . Smokeless tobacco: Never Used  . Tobacco comment: quit 2008  Vaping Use  . Vaping Use: Never used  Substance and Sexual Activity  . Alcohol use: No    Alcohol/week: 0.0 standard drinks    Comment: Occasional use  . Drug use: No  . Sexual activity: Yes    Partners: Male    Birth control/protection: Surgical  Other Topics Concern  . Not on file  Social History Narrative  . Not on file   Social Determinants of Health   Financial Resource Strain:   . Difficulty of Paying Living Expenses: Not on file  Food Insecurity:   . Worried About Charity fundraiser in the Last Year: Not on file  . Ran Out of Food in the Last Year: Not on file  Transportation Needs:   . Lack of Transportation (Medical): Not on file  . Lack of Transportation (Non-Medical): Not on file  Physical Activity:   . Days of Exercise per Week: Not on file  . Minutes of Exercise per Session: Not on file  Stress:   . Feeling of Stress : Not on file  Social Connections:   . Frequency of Communication with Friends and Family: Not on file  . Frequency of Social Gatherings with Friends and Family: Not on file  . Attends Religious Services: Not on file  . Active Member of Clubs or Organizations: Not on file  . Attends Archivist Meetings: Not on file  . Marital Status: Not on file    Allergies: No Known Allergies  Metabolic Disorder Labs: Lab Results  Component Value Date   HGBA1C 5.4 06/10/2015   MPG 108 06/10/2015   No results found for: PROLACTIN Lab Results  Component Value Date   CHOL 208 (H) 11/30/2018   TRIG 116.0 11/30/2018   HDL 55.00 11/30/2018   CHOLHDL 4 11/30/2018   VLDL 23.2 11/30/2018   LDLCALC 130 (H) 11/30/2018   Lab Results  Component Value Date   TSH 1.94 11/29/2019   TSH 1.48 05/22/2019    Therapeutic  Level Labs: Lab Results  Component Value Date   LITHIUM 1.2 11/08/2018   LITHIUM 0.80 06/10/2015   No results found for: VALPROATE No components found for:  CBMZ  Current Medications: Current Outpatient Medications  Medication Sig Dispense Refill  . Calcium Carbonate-Vitamin D (CALTRATE 600+D PO) Take 2 tablets by mouth daily.    [name] lamoTRIgine (LAMICTAL) 25 MG tablet Take 2 tablets (50 mg total) by mouth daily. 180 tablet 0  . levothyroxine (SYNTHROID) 150 MCG tablet Take 1 tablet (150 mcg total) by mouth daily. [place]  tablet 1  . Multiple Vitamin (MULTIVITAMIN WITH MINERALS) TABS tablet Take 1 tablet by mouth daily.    . Omega-3 Fatty Acids (FISH OIL) 1000 MG CAPS Take 2,000 mg by mouth daily.    [name] PARoxetine (PAXIL) 20 MG tablet Take 1 tablet (20 mg total) by mouth daily. 90 tablet 0  . QUEtiapine (SEROQUEL) 100 MG tablet Take 1 tablet (100 mg total) by mouth at bedtime. 90 tablet 0   No current facility-administered medications for this visit.     Musculoskeletal: Strength & Muscle Tone: N/A Gait & Station: N/A Patient leans: N/A  Psychiatric Specialty Exam: Review of Systems  Psychiatric/Behavioral: Negative for agitation, behavioral problems, confusion, decreased concentration, dysphoric mood, hallucinations, self-injury, sleep disturbance and suicidal ideas. The patient is not nervous/anxious and is not hyperactive.   All other systems reviewed and are negative.   There were no vitals taken for this visit.There is no height or weight on file to calculate BMI.  General Appearance: Fairly Groomed  Eye Contact:  Good  Speech:  Clear and Coherent  Volume:  Normal  Mood:  good  Affect:  Appropriate, Congruent and Full Range  Thought Process:  Coherent  Orientation:  Full (Time, Place, and Person)  Thought Content: Logical   Suicidal Thoughts:  No  Homicidal Thoughts:  No  Memory:  Immediate;   Good  Judgement:  Good  Insight:  Good  Psychomotor Activity:  Normal   Concentration:  Concentration: Good and Attention Span: Good  Recall:  Good  Fund of Knowledge: Good  Language: Good  Akathisia:  No  Handed:  Right  AIMS (if indicated): not done  Assets:  Communication Skills Desire for Improvement  ADL's:  Intact  Cognition: WNL  Sleep:  Good   Screenings: PHQ2-9     Office Visit from 12/13/2019 in [place] at [facility] from 11/08/2018 in [place] at [facility] Total Score 0 0  PHQ-9 Total Score 6 --       Assessment and Plan:  Miranda Harris is a 59 y.o. 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   1. Bipolar 1 disorder ([place]) She denies significant mood symptoms since the last visit.  Will continue current medication regimen.  Will continue lamotrigine for bipolar disorder.  She is aware of its risk of Stevens-Johnson syndrome.  We will continue quetiapine for bipolar disorder.  Discussed potential metabolic side effect.  Will continue Paxil for bipolar disorder.   Plan:  I have reviewed and updated plans as below 1. Continue Paxil 20 mgat night  2. Continue lamotrigine 50 mg daily  3.Continue quetiapine 100 mg at night 4.Next appointment:  11/18 at 11:50 for 20 mins, video   Past trials of medication: Sertraline,fluoxetine (drowsiness),Lexapro,Duloxetine,venlafaxine (initially worked well, stopped working, hair loss),Wellbutrin(insomnia),Latuda,Abilify (flu like symptoms), Haldol (EPS), Thorazine (EPS), Stelazine, Lithium, Lunesta (sedation), Ambien (sleepwalking, driving, eating), Trazodone (excessive sedation), Xanax  The patient demonstrates the following risk factors for suicide: Chronic risk factorsfor suicide include psychiatric disorder /bipolar disorder,history of substance use disorder,  family history of suicide attempt. Acute risk factorsfor suicide includenone.Protective factorsfor this patient include positive social support, responsibility to others (family), coping skills, hope for the future. Although patient reports passive SI, she adamantly denies any intent and has had no previous suicide attempt.Considering these factors, the overall suicide risk at this  point appears to be low.  [name], MD 05/21/2020, 12:11 PM

## 2020-05-21 ENCOUNTER — Encounter (HOSPITAL_COMMUNITY): Payer: Self-pay | Admitting: Psychiatry

## 2020-05-21 ENCOUNTER — Telehealth (INDEPENDENT_AMBULATORY_CARE_PROVIDER_SITE_OTHER): Payer: 59 | Admitting: Psychiatry

## 2020-05-21 ENCOUNTER — Other Ambulatory Visit: Payer: Self-pay

## 2020-05-21 DIAGNOSIS — F319 Bipolar disorder, unspecified: Secondary | ICD-10-CM

## 2020-05-21 NOTE — Patient Instructions (Signed)
1. Continue Paxil 20 mgat night  2. Continue lamotrigine 50 mg daily  3.Continue quetiapine 100 mg at night 4.Next appointment:  11/18 at 11:50

## 2020-06-08 ENCOUNTER — Other Ambulatory Visit: Payer: Self-pay | Admitting: Family Medicine

## 2020-06-08 DIAGNOSIS — Z0000 Encounter for general adult medical examination without abnormal findings: Secondary | ICD-10-CM

## 2020-06-09 ENCOUNTER — Telehealth: Payer: Self-pay | Admitting: Neurology

## 2020-06-09 NOTE — Telephone Encounter (Signed)
Pt called to report she recent d/c seroquel and has seen an improvement in her headaches.   Pt wanted MD to be notified. Seroquel has been d/c off MAR.

## 2020-06-09 NOTE — Telephone Encounter (Signed)
Pt called have stop taking Seroquel, not having any headaches.

## 2020-06-10 ENCOUNTER — Other Ambulatory Visit: Payer: Self-pay | Admitting: Internal Medicine

## 2020-06-10 ENCOUNTER — Ambulatory Visit: Payer: Medicare Other | Admitting: Neurology

## 2020-06-10 DIAGNOSIS — Z0000 Encounter for general adult medical examination without abnormal findings: Secondary | ICD-10-CM

## 2020-06-10 NOTE — Telephone Encounter (Signed)
Thank you for the notification!

## 2020-06-22 ENCOUNTER — Other Ambulatory Visit: Payer: Self-pay

## 2020-06-22 ENCOUNTER — Ambulatory Visit
Admission: RE | Admit: 2020-06-22 | Discharge: 2020-06-22 | Disposition: A | Discharge status: Home / Self Care | Payer: Medicare Other | Source: Ambulatory Visit | Attending: Family Medicine | Admitting: Family Medicine

## 2020-06-22 DIAGNOSIS — Z0000 Encounter for general adult medical examination without abnormal findings: Secondary | ICD-10-CM

## 2020-07-27 ENCOUNTER — Other Ambulatory Visit (HOSPITAL_COMMUNITY): Payer: Self-pay | Admitting: Psychiatry

## 2020-07-27 DIAGNOSIS — F319 Bipolar disorder, unspecified: Secondary | ICD-10-CM

## 2020-07-27 MED ORDER — LAMOTRIGINE 25 MG PO TABS: 50.0000 mg | ORAL_TABLET | Freq: Every day | ORAL | 0 refills | Status: DC

## 2020-07-27 MED ORDER — QUETIAPINE FUMARATE 100 MG PO TABS: 100.0000 mg | ORAL_TABLET | Freq: Every day | ORAL | 0 refills | Status: DC

## 2020-08-13 ENCOUNTER — Encounter: Payer: Self-pay | Admitting: Psychiatry

## 2020-08-13 ENCOUNTER — Telehealth: Payer: Medicare Other | Admitting: Psychiatry

## 2020-08-13 ENCOUNTER — Telehealth (INDEPENDENT_AMBULATORY_CARE_PROVIDER_SITE_OTHER): Payer: 59 | Admitting: Psychiatry

## 2020-08-13 ENCOUNTER — Telehealth (HOSPITAL_COMMUNITY): Payer: Medicare Other | Admitting: Psychiatry

## 2020-08-13 ENCOUNTER — Other Ambulatory Visit: Payer: Self-pay

## 2020-08-13 DIAGNOSIS — F319 Bipolar disorder, unspecified: Secondary | ICD-10-CM | POA: Diagnosis not present

## 2020-08-13 MED ORDER — PAROXETINE HCL 20 MG PO TABS: 20.0000 mg | ORAL_TABLET | Freq: Every day | ORAL | 0 refills | Status: DC

## 2020-08-13 MED ORDER — LAMOTRIGINE 25 MG PO TABS: 50.0000 mg | ORAL_TABLET | Freq: Every day | ORAL | 0 refills | Status: DC

## 2020-08-13 NOTE — Patient Instructions (Signed)
1. Continue Paxil 20 mgat night  2. Continue lamotrigine 50 mg daily  3.Continue quetiapine 50 mg at night (headache, fatigue at higher dose) 4.Next appointment: 2/17 at 8:40

## 2020-08-13 NOTE — Progress Notes (Signed)
Virtual Visit via Video Note  I connected with Miranda Harris on 08/13/20 at  9:40 AM EST by a video enabled telemedicine application and verified that I am speaking with the correct person using two identifiers.  Location: Patient: home Provider: office   I discussed the limitations of evaluation and management by telemedicine and the availability of in person appointments. The patient expressed understanding and agreed to proceed.    I discussed the assessment and treatment plan with the patient. The patient was provided an opportunity to ask questions and all were answered. The patient agreed with the plan and demonstrated an understanding of the instructions.   The patient was advised to call back or seek an in-person evaluation if the symptoms worsen or if the condition fails to improve as anticipated.  I provided 20 minutes of non-face-to-face time during this encounter.   [name], MD    [facility] MD/PA/NP OP Progress Note  08/13/2020 9:55 AM Miranda Harris  MRN:  [id number]  Chief Complaint:  Chief Complaint    Follow-up; Other     HPI:  This is a follow-up appointment for bipolar disorder.  She states that she has been doing well.  She is working at the [employer] in [place].  It has been busy due to holiday season.  She will be working at night for 3 to 8 hours next week.  She reduce the dose of quetiapine to 50 mg as she was more fatigued since starting to work.  She believes the current dose is working very well.  She agrees to contact the office if any change in her medication.  She reports good relationship with her husband.  She denies insomnia.  She feels fatigue at times.  She has good concentration; she states that she was commented on her good attention for details at work.  She has good appetite.  She feels good about weight loss since starting to work.  She denies SI.  She denies decreased need for sleep or euphoria.   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 Bipolar 1 disorder (HCC)  F31.9 PARoxetine (PAXIL) 20 MG tablet    lamoTRIgine (LAMICTAL) 25 MG tablet    Past Psychiatric History: Please see initial evaluation for full details. I have reviewed the history. No updates at this time.     Past Medical History:  Past Medical History:  Diagnosis Date  . Bipolar disorder ([place])   . Elevated serum creatinine    related to lithium use  . Helicobacter pylori (H. pylori) infection    pt denies  . Hypothyroidism   . Perirectal fistula   . Thyroid disease   . Trochanteric bursitis of right hip     Past Surgical History:  Procedure Laterality Date  . EVALUATION UNDER ANESTHESIA WITH HEMORRHOIDECTOMY N/A 06/28/2017   Procedure: EXAM UNDER ANESTHESIA WITH EXTERNAL HEMORRHOIDECTOMY AND REPAIR OF PERIRECTAL FISTULA;  Surgeon: Michael Boston, MD;  Location: [facility];  Service: General;  Laterality: N/A;  . HERNIA REPAIR  3-54 yrs ago   umbilical hernia  . INCISION AND DRAINAGE PERIRECTAL ABSCESS N/A 04/18/2017   Procedure: IRRIGATION AND DEBRIDEMENT PERIRECTAL  ABSCESS, placement of seton, sigmoidoscopy;  Surgeon: Excell Seltzer, MD;  Location: [facility];  Service: General;  Laterality: N/A;  . LAPAROSCOPIC APPENDECTOMY    . VAGINAL HYSTERECTOMY Bilateral    complete  . WISDOM TOOTH EXTRACTION      Family Psychiatric History: Please see initial evaluation for full details. I have reviewed the history. No updates at this time.     Family History:  Family History  Problem Relation Age of Onset  . Anxiety disorder Brother   . Breast cancer Neg Hx     Social History:  Social History   Socioeconomic History  . Marital status:  Married    Spouse name: Not on file  . Number of children: Not on file  . Years of education: Not on file  . Highest education level: Not on file  Occupational History  . Not on file  Tobacco Use  . Smoking status: Former Smoker    Packs/day: 1.00    Years: 30.00    Pack years: 30.00    Types: Cigarettes  . Smokeless tobacco: Never Used  . Tobacco comment: quit 2008  Vaping Use  . Vaping Use: Never used  Substance and Sexual Activity  . Alcohol use: No    Alcohol/week: 0.0 standard drinks    Comment: Occasional use  . Drug use: No  . Sexual activity: Yes    Partners: Male    Birth control/protection: Surgical  Other Topics Concern  . Not on file  Social History Narrative  . Not on file   Social Determinants of Health   Financial Resource Strain:   . Difficulty of Paying Living Expenses: Not on file  Food Insecurity:   . Worried About Charity fundraiser in the Last Year: Not on file  . Ran Out of Food in the Last Year: Not on file  Transportation Needs:   . Lack of Transportation (Medical): Not on file  . Lack of Transportation (Non-Medical): Not on file  Physical Activity:   . Days of Exercise per Week: Not on file  . Minutes of Exercise per Session: Not on file  Stress:   . Feeling of Stress : Not on file  Social Connections:   . Frequency of Communication with Friends and Family: Not on file  . Frequency of Social Gatherings with Friends and Family: Not on file  . Attends Religious Services: Not on file  . Active Member of Clubs or Organizations: Not on file  . Attends Archivist Meetings: Not on file  . Marital Status: Not on file    Allergies: No Known Allergies  Metabolic Disorder Labs: Lab Results  Component Value Date   HGBA1C 5.4 06/10/2015   MPG 108 06/10/2015   No results found for: PROLACTIN Lab Results  Component Value Date   CHOL 208 (H) 11/30/2018   TRIG 116.0 11/30/2018   HDL 55.00 11/30/2018   CHOLHDL 4 11/30/2018   VLDL  23.2 11/30/2018   LDLCALC 130 (H) 11/30/2018   Lab Results  Component Value Date   TSH 1.94 11/29/2019   TSH 1.48 05/22/2019    Therapeutic Level Labs: Lab Results  Component Value Date   LITHIUM 1.2 11/08/2018   LITHIUM 0.80 06/10/2015   No results found for: VALPROATE No components found for:  CBMZ  Current Medications: Current Outpatient Medications  Medication Sig Dispense Refill  . Calcium Carbonate-Vitamin D (CALTRATE 600+D PO) Take 2 tablets by mouth daily.    Derrill Memo ON 11/09/2020] lamoTRIgine (  LAMICTAL) 25 MG tablet Take 2 tablets (50 mg total) by mouth daily. 180 tablet 0  . levothyroxine (SYNTHROID) 150 MCG tablet Take 1 tablet (150 mcg total) by mouth daily. 90 tablet 1  . Multiple Vitamin (MULTIVITAMIN WITH MINERALS) TABS tablet Take 1 tablet by mouth daily.    . Omega-3 Fatty Acids (FISH OIL) 1000 MG CAPS Take 2,000 mg by mouth daily.    [name] PARoxetine (PAXIL) 20 MG tablet Take 1 tablet (20 mg total) by mouth daily. 90 tablet 0  . QUEtiapine (SEROQUEL) 100 MG tablet Take 1 tablet (100 mg total) by mouth at bedtime. 90 tablet 0   No current facility-administered medications for this visit.     Musculoskeletal: Strength & Muscle Tone: N/A Gait & Station: N/A Patient leans: N/A  Psychiatric Specialty Exam: Review of Systems  Psychiatric/Behavioral: Negative for agitation, behavioral problems, confusion, decreased concentration, dysphoric mood, hallucinations, self-injury, sleep disturbance and suicidal ideas. The patient is not nervous/anxious and is not hyperactive.   All other systems reviewed and are negative.   There were no vitals taken for this visit.There is no height or weight on file to calculate BMI.  General Appearance: Fairly Groomed  Eye Contact:  Good  Speech:  Clear and Coherent  Volume:  Normal  Mood:  good  Affect:  Appropriate, Congruent and euthymic  Thought Process:  Coherent  Orientation:  Full (Time, Place, and Person)  Thought  Content: Logical   Suicidal Thoughts:  No  Homicidal Thoughts:  No  Memory:  Immediate;   Good  Judgement:  Good  Insight:  Good  Psychomotor Activity:  Normal  Concentration:  Concentration: Good and Attention Span: Good  Recall:  Good  Fund of Knowledge: Good  Language: Good  Akathisia:  No  Handed:  Right  AIMS (if indicated): not done  Assets:  Communication Skills Desire for Improvement  ADL's:  Intact  Cognition: WNL  Sleep:  Good   Screenings: PHQ2-9     Office Visit from 12/13/2019 in [place] at [facility] from 11/08/2018 in [place] at [facility] Total Score 0 0  PHQ-9 Total Score 6 --       Assessment and Plan:  Miranda Harris is a 59 y.o. 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   1. Bipolar 1 disorder ([place]) She denies significant mood symptoms since her last visit.  Will continue current medication regimen.  We will continue lamotrigine for bipolar disorder.  She is aware of its risk of Stevens-Johnson syndrome.  We will continue quetiapine for bipolar disorder.  Discussed potential metabolic side effect and EPS.  Will continue Paxil for depression.   Plan:  I have reviewed and updated plans as below 1. Continue Paxil 20 mgat night  2. Continue lamotrigine 50 mg daily  3.Continue quetiapine 50 mg at night (headache, fatigue at higher dose) 4.Next appointment: 2/17 at 8:40 for 20 mins, vidoe   Past trials of medication: Sertraline,fluoxetine (drowsiness),Lexapro,Duloxetine,venlafaxine (initially worked well, stopped working, hair loss),Wellbutrin(insomnia),Latuda,Abilify (flu like symptoms), Haldol (EPS), Thorazine (EPS), Stelazine, Lithium, Lunesta (sedation), Ambien (sleepwalking, driving, eating), Trazodone (excessive  sedation), Xanax  The patient demonstrates the following risk factors for suicide: Chronic risk factorsfor suicide include psychiatric disorder /bipolar disorder,history of substance use disorder, family history of suicide attempt. Acute risk factorsfor suicide includenone.Protective factorsfor this patient include positive social  support, responsibility to others (family), coping skills, hope for the future. Although patient reports passive SI, she adamantly denies any intent and has had no previous suicide attempt.Considering these factors, the overall suicide risk at this point appears to be low.   [name], MD 08/13/2020, 9:55 AM

## 2020-08-26 ENCOUNTER — Encounter: Payer: Self-pay | Admitting: Internal Medicine

## 2020-08-26 ENCOUNTER — Ambulatory Visit (INDEPENDENT_AMBULATORY_CARE_PROVIDER_SITE_OTHER): Payer: 59 | Admitting: Internal Medicine

## 2020-08-26 ENCOUNTER — Other Ambulatory Visit: Payer: Self-pay

## 2020-08-26 VITALS — BP 120/80 | HR 74 | Temp 98.8°F | Ht 68.0 in | Wt 186.5 lb

## 2020-08-26 DIAGNOSIS — E039 Hypothyroidism, unspecified: Secondary | ICD-10-CM

## 2020-08-26 DIAGNOSIS — Z1211 Encounter for screening for malignant neoplasm of colon: Secondary | ICD-10-CM

## 2020-08-26 DIAGNOSIS — F319 Bipolar disorder, unspecified: Secondary | ICD-10-CM

## 2020-08-26 DIAGNOSIS — Z0000 Encounter for general adult medical examination without abnormal findings: Secondary | ICD-10-CM

## 2020-08-26 DIAGNOSIS — Z23 Encounter for immunization: Secondary | ICD-10-CM | POA: Diagnosis not present

## 2020-08-26 NOTE — Progress Notes (Signed)
Established Patient Office Visit     This visit occurred during the SARS-CoV-2 public health emergency.  Safety protocols were in place, including screening questions prior to the visit, additional usage of staff PPE, and extensive cleaning of exam room while observing appropriate contact time as indicated for disinfecting solutions.    CC/Reason for Visit: Annual preventive exam  HPI: Miranda Harris is a 59 y.o. female who is coming in today for the above mentioned reasons. Past Medical History is significant for: Bipolar 1 disorder followed by psychiatry with recent medication changes, carpal tunnel syndrome status post bilateral release surgery in the past year, hypothyroidism on levothyroxine.  Due to a persistent headache she saw neurology over the summer who ordered an MRI that was unremarkable.  Within the past 18 months she has had bilateral cataract surgery.  She is requesting her flu vaccine today.  She is due for her Covid booster which she will schedule at her pharmacy.  She had a colonoscopy in 2017 and is a 10-year callback.  She had a mammogram in September that was reported as negative.   Past Medical/Surgical History: Past Medical History:  Diagnosis Date  . Bipolar disorder ([place])   . Elevated serum creatinine    related to lithium use  . Helicobacter pylori (H. pylori) infection    pt denies  . Hypothyroidism   . Perirectal fistula   . Thyroid disease   . Trochanteric bursitis of right hip     Past Surgical History:  Procedure Laterality Date  . EVALUATION UNDER ANESTHESIA WITH HEMORRHOIDECTOMY N/A 06/28/2017   Procedure: EXAM UNDER ANESTHESIA WITH EXTERNAL HEMORRHOIDECTOMY AND REPAIR OF PERIRECTAL FISTULA;  Surgeon: Michael Boston, MD;  Location: [facility];  Service: General;  Laterality: N/A;  . HERNIA REPAIR  4-23 yrs ago   umbilical hernia  . INCISION AND DRAINAGE PERIRECTAL ABSCESS N/A 04/18/2017   Procedure: IRRIGATION AND  DEBRIDEMENT PERIRECTAL ABSCESS, placement of seton, sigmoidoscopy;  Surgeon: Excell Seltzer, MD;  [facility];  Service: General;  Laterality: N/A;  . LAPAROSCOPIC APPENDECTOMY    . VAGINAL HYSTERECTOMY Bilateral    complete  . WISDOM TOOTH EXTRACTION      Social History:  reports that she has quit smoking. Her smoking use included cigarettes. She has a 30.00 pack-year smoking history. She has never used smokeless tobacco. She reports that she does not drink alcohol and does not use drugs.  Allergies: Allergies  Allergen Reactions  . Tetracyclines & Related Rash    Family History:  Family History  Problem Relation Age of Onset  . Anxiety disorder Brother   . Breast cancer Neg Hx      Current Outpatient Medications:  .  Calcium Carbonate-Vitamin D (CALTRATE 600+D PO), Take 2 tablets by mouth daily., Disp: , Rfl:  .  lamoTRIgine 50 MG TBDP, Take 1 tablet by mouth in the morning., Disp: , Rfl:  .  levothyroxine (SYNTHROID) 150 MCG tablet, Take 1 tablet (150 mcg total) by mouth daily., Disp: 90 tablet, Rfl: 1 .  Multiple Vitamin (MULTIVITAMIN WITH MINERALS) TABS tablet, Take 1 tablet by mouth daily., Disp: , Rfl:  .  Omega-3 Fatty Acids (FISH OIL) 1000 MG CAPS, Take 2,000 mg by mouth daily., Disp: , Rfl:  .  PARoxetine (PAXIL) 20 MG tablet, Take 1 tablet (20 mg total) by mouth daily., Disp: 90 tablet, Rfl: 0 .  QUERCETIN PO, Take 2 tablets by mouth in the morning., Disp: , Rfl:  .  QUEtiapine (SEROQUEL) 50 MG tablet, Take 50 mg by mouth at bedtime., Disp: , Rfl:   Review of Systems:  Constitutional: Denies fever, chills, diaphoresis, appetite change and fatigue.  HEENT: Denies photophobia, eye pain, redness, hearing loss, ear pain, congestion, sore throat, rhinorrhea, sneezing, mouth sores, trouble swallowing, neck pain, neck stiffness and tinnitus.   Respiratory: Denies SOB, DOE, cough, chest tightness,  and wheezing.   Cardiovascular: Denies chest pain, palpitations and  leg swelling.  Gastrointestinal: Denies nausea, vomiting, abdominal pain, diarrhea, constipation, blood in stool and abdominal distention.  Genitourinary: Denies dysuria, urgency, frequency, hematuria, flank pain and difficulty urinating.  Endocrine: Denies: hot or cold intolerance, sweats, changes in hair or nails, polyuria, polydipsia. Musculoskeletal: Denies myalgias, back pain, joint swelling, arthralgias and gait problem.  Skin: Denies pallor, rash and wound.  Neurological: Denies dizziness, seizures, syncope, weakness, light-headedness, numbness and headaches.  Hematological: Denies adenopathy. Easy bruising, personal or family bleeding history  Psychiatric/Behavioral: Denies suicidal ideation, mood changes, confusion, nervousness, sleep disturbance and agitation    Physical Exam: Vitals:   08/26/20 1305  BP: 120/80  Pulse: 74  Temp: 98.8 F (37.1 C)  TempSrc: Oral  SpO2: 97%  Weight: 186 lb 8 oz (84.6 kg)  Height: $Remove'5\' 8"'FRNEQFh$  (1.727 m)    Body mass index is 28.36 kg/m.   Constitutional: NAD, calm, comfortable Eyes: PERRL, lids and conjunctivae normal ENMT: Mucous membranes are moist. Posterior pharynx clear of any exudate or lesions. Normal dentition. Tympanic membrane is pearly white, no erythema or bulging. Neck: normal, supple, no masses, no thyromegaly Respiratory: clear to auscultation bilaterally, no wheezing, no crackles. Normal respiratory effort. No accessory muscle use.  Cardiovascular: Regular rate and rhythm, no murmurs / rubs / gallops. No extremity edema.   Abdomen: no tenderness, no masses palpated. No hepatosplenomegaly. Bowel sounds positive.  Musculoskeletal: no clubbing / cyanosis. No joint deformity upper and lower extremities. Good ROM, no contractures. Normal muscle tone.  Skin: no rashes, lesions, ulcers. No induration Neurologic: CN 2-12 grossly intact. Sensation intact, DTR normal. Strength 5/5 in all 4.  Psychiatric: Normal judgment and insight. Alert  and oriented x 3. Normal mood.    Impression and Plan:  Screening for malignant neoplasm of colon  - Plan: Ambulatory referral to Gastroenterology  Encounter for preventive health examination  -Advised routine eye and dental care. -Flu vaccine today, due for Covid booster but otherwise immunizations are up-to-date and age-appropriate. -She will return for labs that she is not fasting today. -She had a colonoscopy in 2017, mammogram was negative in September 2021. -She follows with her GYN for routine cervical cancer screening.  Bipolar 1 disorder (HCC) -Mood is stable, continue follow-up with psychiatry.  Adult hypothyroidism  - Plan: TSH -For now, continue current levothyroxine dosing.  Need for influenza vaccination -Flu vaccine administered today   Patient Instructions  -Nice seeing you today!!  -Lab work today; will notify you once results are available.  -Flu vaccine today.  -Please remember your COVID booster at your pharmacy.  -Schedule follow up in 1 year or sooner as needed.   Preventive Care 16-67 Years Old, Female Preventive care refers to visits with your health care provider and lifestyle choices that can promote health and wellness. This includes:  A yearly physical exam. This may also be called an annual well check.  Regular dental visits and eye exams.  Immunizations.  Screening for certain conditions.  Healthy lifestyle choices, such as eating a healthy diet, getting regular exercise, not using drugs or  products that contain nicotine and tobacco, and limiting alcohol use. What can I expect for my preventive care visit? Physical exam Your health care provider will check your:  Height and weight. This may be used to calculate body mass index (BMI), which tells if you are at a healthy weight.  Heart rate and blood pressure.  Skin for abnormal spots. Counseling Your health care provider may ask you questions about your:  Alcohol, tobacco, and  drug use.  Emotional well-being.  Home and relationship well-being.  Sexual activity.  Eating habits.  Work and work Statistician.  Method of birth control.  Menstrual cycle.  Pregnancy history. What immunizations do I need?  Influenza (flu) vaccine  This is recommended every year. Tetanus, diphtheria, and pertussis (Tdap) vaccine  You may need a Td booster every 10 years. Varicella (chickenpox) vaccine  You may need this if you have not been vaccinated. Zoster (shingles) vaccine  You may need this after age 26. Measles, mumps, and rubella (MMR) vaccine  You may need at least one dose of MMR if you were born in 1957 or later. You may also need a second dose. Pneumococcal conjugate (PCV13) vaccine  You may need this if you have certain conditions and were not previously vaccinated. Pneumococcal polysaccharide (PPSV23) vaccine  You may need one or two doses if you smoke cigarettes or if you have certain conditions. Meningococcal conjugate (MenACWY) vaccine  You may need this if you have certain conditions. Hepatitis A vaccine  You may need this if you have certain conditions or if you travel or work in places where you may be exposed to hepatitis A. Hepatitis B vaccine  You may need this if you have certain conditions or if you travel or work in places where you may be exposed to hepatitis B. Haemophilus influenzae type b (Hib) vaccine  You may need this if you have certain conditions. Human papillomavirus (HPV) vaccine  If recommended by your health care provider, you may need three doses over 6 months. You may receive vaccines as individual doses or as more than one vaccine together in one shot (combination vaccines). Talk with your health care provider about the risks and benefits of combination vaccines. What tests do I need? Blood tests  Lipid and cholesterol levels. These may be checked every 5 years, or more frequently if you are over 64 years  old.  Hepatitis C test.  Hepatitis B test. Screening  Lung cancer screening. You may have this screening every year starting at age 22 if you have a 30-pack-year history of smoking and currently smoke or have quit within the past 15 years.  Colorectal cancer screening. All adults should have this screening starting at age 62 and continuing until age 74. Your health care provider may recommend screening at age 73 if you are at increased risk. You will have tests every 1-10 years, depending on your results and the type of screening test.  Diabetes screening. This is done by checking your blood sugar (glucose) after you have not eaten for a while (fasting). You may have this done every 1-3 years.  Mammogram. This may be done every 1-2 years. Talk with your health care provider about when you should start having regular mammograms. This may depend on whether you have a family history of breast cancer.  BRCA-related cancer screening. This may be done if you have a family history of breast, ovarian, tubal, or peritoneal cancers.  Pelvic exam and Pap test. This may be done  every 3 years starting at age 36. Starting at age 66, this may be done every 5 years if you have a Pap test in combination with an HPV test. Other tests  Sexually transmitted disease (STD) testing.  Bone density scan. This is done to screen for osteoporosis. You may have this scan if you are at high risk for osteoporosis. Follow these instructions at home: Eating and drinking  Eat a diet that includes fresh fruits and vegetables, whole grains, lean protein, and low-fat dairy.  Take vitamin and mineral supplements as recommended by your health care provider.  Do not drink alcohol if: ? Your health care provider tells you not to drink. ? You are pregnant, may be pregnant, or are planning to become pregnant.  If you drink alcohol: ? Limit how much you have to 0-1 drink a day. ? Be aware of how much alcohol is in your  drink. In the [place] one drink equals one 12 oz bottle of beer (355 mL), one 5 oz glass of wine (148 mL), or one 1 oz glass of hard liquor (44 mL). Lifestyle  Take daily care of your teeth and gums.  Stay active. Exercise for at least 30 minutes on 5 or more days each week.  Do not use any products that contain nicotine or tobacco, such as cigarettes, e-cigarettes, and chewing tobacco. If you need help quitting, ask your health care provider.  If you are sexually active, practice safe sex. Use a condom or other form of birth control (contraception) in order to prevent pregnancy and STIs (sexually transmitted infections).  If told by your health care provider, take low-dose aspirin daily starting at age 61. What's next?  Visit your health care provider once a year for a well check visit.  Ask your health care provider how often you should have your eyes and teeth checked.  Stay up to date on all vaccines. This information is not intended to replace advice given to you by your health care provider. Make sure you discuss any questions you have with your health care provider. Document Revised: 05/24/2018 Document Reviewed: 05/24/2018 Elsevier Patient Education  2020 [place], MD [facility]

## 2020-08-26 NOTE — Addendum Note (Signed)
Addended by: Westley Hummer B on: 08/26/2020 04:13 PM   Modules accepted: Orders

## 2020-08-26 NOTE — Patient Instructions (Signed)
-Nice seeing you today!!  -Lab work today; will notify you once results are available.  -Flu vaccine today.  -Please remember your COVID booster at your pharmacy.  -Schedule follow up in 1 year or sooner as needed.   Preventive Care 41-60 Years Old, Female Preventive care refers to visits with your health care provider and lifestyle choices that can promote health and wellness. This includes:  A yearly physical exam. This may also be called an annual well check.  Regular dental visits and eye exams.  Immunizations.  Screening for certain conditions.  Healthy lifestyle choices, such as eating a healthy diet, getting regular exercise, not using drugs or products that contain nicotine and tobacco, and limiting alcohol use. What can I expect for my preventive care visit? Physical exam Your health care provider will check your:  Height and weight. This may be used to calculate body mass index (BMI), which tells if you are at a healthy weight.  Heart rate and blood pressure.  Skin for abnormal spots. Counseling Your health care provider may ask you questions about your:  Alcohol, tobacco, and drug use.  Emotional well-being.  Home and relationship well-being.  Sexual activity.  Eating habits.  Work and work Statistician.  Method of birth control.  Menstrual cycle.  Pregnancy history. What immunizations do I need?  Influenza (flu) vaccine  This is recommended every year. Tetanus, diphtheria, and pertussis (Tdap) vaccine  You may need a Td booster every 10 years. Varicella (chickenpox) vaccine  You may need this if you have not been vaccinated. Zoster (shingles) vaccine  You may need this after age 14. Measles, mumps, and rubella (MMR) vaccine  You may need at least one dose of MMR if you were born in 1957 or later. You may also need a second dose. Pneumococcal conjugate (PCV13) vaccine  You may need this if you have certain conditions and were not  previously vaccinated. Pneumococcal polysaccharide (PPSV23) vaccine  You may need one or two doses if you smoke cigarettes or if you have certain conditions. Meningococcal conjugate (MenACWY) vaccine  You may need this if you have certain conditions. Hepatitis A vaccine  You may need this if you have certain conditions or if you travel or work in places where you may be exposed to hepatitis A. Hepatitis B vaccine  You may need this if you have certain conditions or if you travel or work in places where you may be exposed to hepatitis B. Haemophilus influenzae type b (Hib) vaccine  You may need this if you have certain conditions. Human papillomavirus (HPV) vaccine  If recommended by your health care provider, you may need three doses over 6 months. You may receive vaccines as individual doses or as more than one vaccine together in one shot (combination vaccines). Talk with your health care provider about the risks and benefits of combination vaccines. What tests do I need? Blood tests  Lipid and cholesterol levels. These may be checked every 5 years, or more frequently if you are over 52 years old.  Hepatitis C test.  Hepatitis B test. Screening  Lung cancer screening. You may have this screening every year starting at age 17 if you have a 30-pack-year history of smoking and currently smoke or have quit within the past 15 years.  Colorectal cancer screening. All adults should have this screening starting at age 94 and continuing until age 54. Your health care provider may recommend screening at age 49 if you are at increased risk. You  will have tests every 1-10 years, depending on your results and the type of screening test. °· Diabetes screening. This is done by checking your blood sugar (glucose) after you have not eaten for a while (fasting). You may have this done every 1-3 years. °· Mammogram. This may be done every 1-2 years. Talk with your health care provider about when you  should start having regular mammograms. This may depend on whether you have a family history of breast cancer. °· BRCA-related cancer screening. This may be done if you have a family history of breast, ovarian, tubal, or peritoneal cancers. °· Pelvic exam and Pap test. This may be done every 3 years starting at age 21. Starting at age 30, this may be done every 5 years if you have a Pap test in combination with an HPV test. °Other tests °· Sexually transmitted disease (STD) testing. °· Bone density scan. This is done to screen for osteoporosis. You may have this scan if you are at high risk for osteoporosis. °Follow these instructions at home: °Eating and drinking °· Eat a diet that includes fresh fruits and vegetables, whole grains, lean protein, and low-fat dairy. °· Take vitamin and mineral supplements as recommended by your health care provider. °· Do not drink alcohol if: °? Your health care provider tells you not to drink. °? You are pregnant, may be pregnant, or are planning to become pregnant. °· If you drink alcohol: °? Limit how much you have to 0-1 drink a day. °? Be aware of how much alcohol is in your drink. In the [place] one drink equals one 12 oz bottle of beer (355 mL), one 5 oz glass of wine (148 mL), or one 1½ oz glass of hard liquor (44 mL). °Lifestyle °· Take daily care of your teeth and gums. °· Stay active. Exercise for at least 30 minutes on 5 or more days each week. °· Do not use any products that contain nicotine or tobacco, such as cigarettes, e-cigarettes, and chewing tobacco. If you need help quitting, ask your health care provider. °· If you are sexually active, practice safe sex. Use a condom or other form of birth control (contraception) in order to prevent pregnancy and STIs (sexually transmitted infections). °· If told by your health care provider, take low-dose aspirin daily starting at age 50. °What's next? °· Visit your health care provider once a year for a well check  visit. °· Ask your health care provider how often you should have your eyes and teeth checked. °· Stay up to date on all vaccines. °This information is not intended to replace advice given to you by your health care provider. Make sure you discuss any questions you have with your health care provider. °Document Revised: 05/24/2018 Document Reviewed: 05/24/2018 °Elsevier Patient Education © 2020 [employer] Inc. ° °

## 2020-09-04 ENCOUNTER — Other Ambulatory Visit (INDEPENDENT_AMBULATORY_CARE_PROVIDER_SITE_OTHER): Payer: Medicare Other

## 2020-09-04 ENCOUNTER — Other Ambulatory Visit: Payer: Self-pay | Admitting: Internal Medicine

## 2020-09-04 ENCOUNTER — Encounter: Payer: Self-pay | Admitting: Internal Medicine

## 2020-09-04 ENCOUNTER — Other Ambulatory Visit: Payer: Self-pay

## 2020-09-04 ENCOUNTER — Encounter: Payer: Self-pay | Admitting: *Deleted

## 2020-09-04 DIAGNOSIS — E559 Vitamin D deficiency, unspecified: Secondary | ICD-10-CM | POA: Insufficient documentation

## 2020-09-04 DIAGNOSIS — E039 Hypothyroidism, unspecified: Secondary | ICD-10-CM

## 2020-09-04 DIAGNOSIS — Z0000 Encounter for general adult medical examination without abnormal findings: Secondary | ICD-10-CM

## 2020-09-04 LAB — COMPREHENSIVE METABOLIC PANEL
ALT: 19 U/L (ref 0–35)
AST: 22 U/L (ref 0–37)
Albumin: 4.3 g/dL (ref 3.5–5.2)
Alkaline Phosphatase: 78 U/L (ref 39–117)
BUN: 25 mg/dL — ABNORMAL HIGH (ref 6–23)
CO2: 25 mEq/L (ref 19–32)
Calcium: 10 mg/dL (ref 8.4–10.5)
Chloride: 105 mEq/L (ref 96–112)
Creatinine, Ser: 1.22 mg/dL — ABNORMAL HIGH (ref 0.40–1.20)
GFR: 48.16 mL/min — ABNORMAL LOW (ref >60.00)
Glucose, Bld: 86 mg/dL (ref 70–99)
Potassium: 5.2 mEq/L — ABNORMAL HIGH (ref 3.5–5.1)
Sodium: 138 mEq/L (ref 135–145)
Total Bilirubin: 0.6 mg/dL (ref 0.2–1.2)
Total Protein: 6.7 g/dL (ref 6.0–8.3)

## 2020-09-04 LAB — CBC WITH DIFFERENTIAL/PLATELET
Basophils Absolute: 0.1 10*3/uL (ref 0.0–0.1)
Basophils Relative: 1.1 % (ref 0.0–3.0)
Eosinophils Absolute: 0.1 10*3/uL (ref 0.0–0.7)
Eosinophils Relative: 2.3 % (ref 0.0–5.0)
HCT: 39.9 % (ref 36.0–46.0)
Hemoglobin: 13.1 g/dL (ref 12.0–15.0)
Lymphocytes Relative: 36.4 % (ref 12.0–46.0)
Lymphs Abs: 1.9 10*3/uL (ref 0.7–4.0)
MCHC: 32.7 g/dL (ref 30.0–36.0)
MCV: 92.5 fl (ref 78.0–100.0)
Monocytes Absolute: 0.4 10*3/uL (ref 0.1–1.0)
Monocytes Relative: 8.5 % (ref 3.0–12.0)
Neutro Abs: 2.7 10*3/uL (ref 1.4–7.7)
Neutrophils Relative %: 51.7 % (ref 43.0–77.0)
Platelets: 261 10*3/uL (ref 150.0–400.0)
RBC: 4.32 Mil/uL (ref 3.87–5.11)
RDW: 14.5 % (ref 11.5–15.5)
WBC: 5.3 10*3/uL (ref 4.0–10.5)

## 2020-09-04 LAB — TSH: TSH: 9.98 u[IU]/mL — ABNORMAL HIGH (ref 0.35–4.50)

## 2020-09-04 LAB — HEMOGLOBIN A1C: Hgb A1c MFr Bld: 5.5 % (ref 4.6–6.5)

## 2020-09-04 LAB — LIPID PANEL
Cholesterol: 209 mg/dL — ABNORMAL HIGH (ref 0–200)
HDL: 71.9 mg/dL (ref >39.00)
LDL Cholesterol: 122 mg/dL — ABNORMAL HIGH (ref 0–99)
NonHDL: 136.99
Total CHOL/HDL Ratio: 3
Triglycerides: 75 mg/dL (ref 0.0–149.0)
VLDL: 15 mg/dL (ref 0.0–40.0)

## 2020-09-04 LAB — VITAMIN D 25 HYDROXY (VIT D DEFICIENCY, FRACTURES): VITD: 28.81 ng/mL — ABNORMAL LOW (ref 30.00–100.00)

## 2020-09-04 LAB — VITAMIN B12: Vitamin B-12: 940 pg/mL — ABNORMAL HIGH (ref 211–911)

## 2020-09-04 MED ORDER — LEVOTHYROXINE SODIUM 175 MCG PO TABS: 175.0000 ug | ORAL_TABLET | Freq: Every day | ORAL | 1 refills | Status: DC

## 2020-09-04 MED ORDER — VITAMIN D (ERGOCALCIFEROL) 1.25 MG (50000 UNIT) PO CAPS: 50000.0000 [IU] | ORAL_CAPSULE | ORAL | 0 refills | Status: DC

## 2020-09-04 NOTE — Addendum Note (Signed)
Addended by: Marrion Coy on: 09/04/2020 07:44 AM   Modules accepted: Orders

## 2020-09-10 ENCOUNTER — Ambulatory Visit: Payer: Medicare Other | Admitting: Rheumatology

## 2020-09-10 ENCOUNTER — Other Ambulatory Visit: Payer: Self-pay | Admitting: Internal Medicine

## 2020-09-10 DIAGNOSIS — E039 Hypothyroidism, unspecified: Secondary | ICD-10-CM

## 2020-09-10 DIAGNOSIS — E785 Hyperlipidemia, unspecified: Secondary | ICD-10-CM

## 2020-09-10 DIAGNOSIS — E559 Vitamin D deficiency, unspecified: Secondary | ICD-10-CM

## 2020-09-10 NOTE — Telephone Encounter (Signed)
Spoke with patient and reviewed lab results. 

## 2020-09-24 NOTE — Addendum Note (Signed)
Addended by: Lerry Liner on: 09/24/2020 01:59 PM   Modules accepted: Orders

## 2020-09-29 ENCOUNTER — Telehealth (INDEPENDENT_AMBULATORY_CARE_PROVIDER_SITE_OTHER): Payer: 59 | Admitting: Psychiatry

## 2020-09-29 ENCOUNTER — Other Ambulatory Visit: Payer: Self-pay

## 2020-09-29 ENCOUNTER — Encounter: Payer: Self-pay | Admitting: Psychiatry

## 2020-09-29 DIAGNOSIS — F319 Bipolar disorder, unspecified: Secondary | ICD-10-CM

## 2020-09-29 NOTE — Progress Notes (Signed)
Virtual Visit via Video Note  I connected with Miranda Harris on 09/29/20 at  2:00 PM EST by a video enabled telemedicine application and verified that I am speaking with the correct person using two identifiers.  Location: Patient: home Provider: office Persons participated in the visit- patient, provider   I discussed the limitations of evaluation and management by telemedicine and the availability of in person appointments. The patient expressed understanding and agreed to proceed.      I discussed the assessment and treatment plan with the patient. The patient was provided an opportunity to ask questions and all were answered. The patient agreed with the plan and demonstrated an understanding of the instructions.   The patient was advised to call back or seek an in-person evaluation if the symptoms worsen or if the condition fails to improve as anticipated.  I provided 23 minutes of non-face-to-face time during this encounter.   Neysa Hotter, MD    [facility] MD/PA/NP OP Progress Note  09/29/2020 2:29 PM Miranda Harris  MRN:  [id number]  Chief Complaint:  Chief Complaint    Follow-up; Other     HPI:  This is a follow-up appointment for bipolar disorder.  She states that she wanted to discuss about her medication.  She believes Paxil makes her feel drowsy.  Although she has been struggling with this at least for several months, she did not want to share this as she thought it will be getting better, and she wanted to be on Paxil.  She takes a nap during the day due to drowsiness.   She also feels more drowsy now that she does not work during the day. She quit her job last year as they asked her to do more hours.  She does not think she feels depressed, although she feels down about her drowsiness as it is difficult for her to do anything.  She does not think she has sleep apnea; she was also frustrated with the evaluation of homestudy test, which she had a few years ago.  She is  willing to try an oral dose of Paxil to see if it helps her drowsiness.  She sleeps 9 hours, with occasional middle insomnia due to nocturia.  She has fair energy.  She has fair concentration.  Although she gained several pounds during the holiday, she has started to lose some weight.  She denies SI.  She denies decreased need for sleep or euphoria.  She denies increased goal-directed activity. She denies anxiety or panic attacks.   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 Bipolar I disorder (HCC)  F31.9     Past Psychiatric History: Please see initial evaluation for full details. I have reviewed the history. No updates at this time.     Past Medical History:  Past Medical History:  Diagnosis Date  . Bipolar disorder (HCC)   . Elevated serum creatinine    related to lithium use  . Helicobacter pylori (H. pylori) infection    pt denies  . Hypothyroidism   . Perirectal fistula   . Thyroid disease   . Trochanteric bursitis of right hip     Past Surgical  History:  Procedure Laterality Date  . EVALUATION UNDER ANESTHESIA WITH HEMORRHOIDECTOMY N/A 06/28/2017   Procedure: EXAM UNDER ANESTHESIA WITH EXTERNAL HEMORRHOIDECTOMY AND REPAIR OF PERIRECTAL FISTULA;  Surgeon: Michael Boston, MD;  Location: [facility];  Service: General;  Laterality: N/A;  . HERNIA REPAIR  [id number] yrs ago   umbilical hernia  . INCISION AND DRAINAGE PERIRECTAL ABSCESS N/A 04/18/2017   Procedure: IRRIGATION AND DEBRIDEMENT PERIRECTAL ABSCESS, placement of seton, sigmoidoscopy;  Surgeon: Excell Seltzer, MD;  Location: [facility];  Service: General;  Laterality: N/A;  . LAPAROSCOPIC APPENDECTOMY    . VAGINAL HYSTERECTOMY Bilateral     complete  . WISDOM TOOTH EXTRACTION      Family Psychiatric History: Please see initial evaluation for full details. I have reviewed the history. No updates at this time.     Family History:  Family History  Problem Relation Age of Onset  . Anxiety disorder Brother   . Breast cancer Neg Hx     Social History:  Social History   Socioeconomic History  . Marital status: Married    Spouse name: Not on file  . Number of children: Not on file  . Years of education: Not on file  . Highest education level: Not on file  Occupational History  . Not on file  Tobacco Use  . Smoking status: Former Smoker    Packs/day: 1.00    Years: 30.00    Pack years: 30.00    Types: Cigarettes  . Smokeless tobacco: Never Used  . Tobacco comment: quit 2008  Vaping Use  . Vaping Use: Never used  Substance and Sexual Activity  . Alcohol use: No    Alcohol/week: 0.0 standard drinks    Comment: Occasional use  . Drug use: No  . Sexual activity: Yes    Partners: Male    Birth control/protection: Surgical  Other Topics Concern  . Not on file  Social History Narrative  . Not on file   Social Determinants of Health   Financial Resource Strain: Not on file  Food Insecurity: Not on file  Transportation Needs: Not on file  Physical Activity: Not on file  Stress: Not on file  Social Connections: Not on file    Allergies:  Allergies  Allergen Reactions  . Tetracyclines & Related Rash    Metabolic Disorder Labs: Lab Results  Component Value Date   HGBA1C 5.5 09/04/2020   MPG 108 06/10/2015   No results found for: PROLACTIN Lab Results  Component Value Date   CHOL 209 (H) 09/04/2020   TRIG 75.0 09/04/2020   HDL 71.90 09/04/2020   CHOLHDL 3 09/04/2020   VLDL 15.0 09/04/2020   LDLCALC 122 (H) 09/04/2020   LDLCALC 130 (H) 11/30/2018   Lab Results  Component Value Date   TSH 9.98 (H) 09/04/2020   TSH 1.94 11/29/2019    Therapeutic Level Labs: Lab Results  Component Value  Date   LITHIUM 1.2 11/08/2018   LITHIUM 0.80 06/10/2015   No results found for: VALPROATE No components found for:  CBMZ  Current Medications: Current Outpatient Medications  Medication Sig Dispense Refill  . Calcium Carbonate-Vitamin D (CALTRATE 600+D PO) Take 2 tablets by mouth daily.    [name] lamoTRIgine 50 MG TBDP Take 1 tablet by mouth in the morning.    [name] levothyroxine (SYNTHROID) 175 MCG tablet Take 1 tablet (175 mcg total) by mouth daily. 90 tablet 1  . Multiple Vitamin (MULTIVITAMIN WITH MINERALS) TABS tablet Take 1 tablet by mouth daily.    [name]  Omega-3 Fatty Acids (FISH OIL) 1000 MG CAPS Take 2,000 mg by mouth daily.    [name] PARoxetine (PAXIL) 20 MG tablet Take 1 tablet (20 mg total) by mouth daily. 90 tablet 0  . QUERCETIN PO Take 2 tablets by mouth in the morning.    [name] QUEtiapine (SEROQUEL) 50 MG tablet Take 50 mg by mouth at bedtime.    . Vitamin D, Ergocalciferol, (DRISDOL) 1.25 MG (50000 UNIT) CAPS capsule Take 1 capsule (50,000 Units total) by mouth every 7 (seven) days for 12 doses. 12 capsule 0   No current facility-administered medications for this visit.     Musculoskeletal: Strength & Muscle Tone: N/A Gait & Station: N/A Patient leans: N/A  Psychiatric Specialty Exam: Review of Systems  Psychiatric/Behavioral: Negative for agitation, behavioral problems, confusion, decreased concentration, dysphoric mood, hallucinations, self-injury, sleep disturbance and suicidal ideas. The patient is not nervous/anxious and is not hyperactive.   All other systems reviewed and are negative.   There were no vitals taken for this visit.There is no height or weight on file to calculate BMI.  General Appearance: Fairly Groomed  Eye Contact:  Good  Speech:  Clear and Coherent  Volume:  Normal  Mood:  good  Affect:  Appropriate, Congruent and euthymic, calm  Thought Process:  Coherent  Orientation:  Full (Time, Place, and Person)  Thought Content: Logical   Suicidal Thoughts:  No   Homicidal Thoughts:  No  Memory:  Immediate;   Good  Judgement:  Good  Insight:  Good  Psychomotor Activity:  Normal  Concentration:  Concentration: Good and Attention Span: Good  Recall:  Good  Fund of Knowledge: Good  Language: Good  Akathisia:  No  Handed:  Right  AIMS (if indicated): not done  Assets:  Communication Skills Desire for Improvement  ADL's:  Intact  Cognition: WNL  Sleep:  Good   Screenings: PHQ2-9   [place] Office Visit from 12/13/2019 in [place] at [facility] from 11/08/2018 in [place] at [facility] Total Score 0 0  PHQ-9 Total Score 6 --       Assessment and Plan:  Miranda Harris is a 60 y.o. 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   1. Bipolar I disorder ([place]) She complains of drowsiness, which she has traveled at least for several months, which she attributes to the Paxil.  She denies any other mood symptoms except mild depressed mood associated with this drowsiness.  Will taper down Paxil to see if it mitigates the side effect of drowsiness.  Will continue lamotrigine for bipolar disorder.  She is aware of its risk of Stevens-Johnson syndrome.  We will continue quetiapine for bipolar disorder.  She is aware of its potential risk of metabolic side effect and EPS.  Noted that although sleep evaluation has been recommended, she is not interested in pursuing this.  We will continue to discuss as needed.  Plan:  I have reviewed and updated plans as below 1. Decrease Paxil 10 mgat night - monitor drowsiness 2. Continue lamotrigine 50 mg daily  3.Continue quetiapine 50 mg at night (headache, fatigue at higher dose) 4.Next appointment:2/17 at 8:40 for 20 mins, vidoe  Past trials of medication: Sertraline,fluoxetine  (drowsiness),Lexapro,Duloxetine,venlafaxine (initially worked well, stopped working, hair loss),Wellbutrin(insomnia),Latuda,Abilify (flu like symptoms), Haldol (EPS), Thorazine (EPS), Stelazine, Lithium, Lunesta (sedation), Ambien (sleepwalking, driving, eating), Trazodone (  excessive sedation), Xanax  The patient demonstrates the following risk factors for suicide: Chronic risk factorsfor suicide include psychiatric disorder /bipolar disorder,history of substance use disorder, family history of suicide attempt. Acute risk factorsfor suicide includenone.Protective factorsfor this patient include positive social support, responsibility to others (family), coping skills, hope for the future. Although patient reports passive SI, she adamantly denies any intent and has had no previous suicide attempt.Considering these factors, the overall suicide risk at this point appears to be low.   [name], MD 09/29/2020, 2:29 PM

## 2020-10-07 ENCOUNTER — Ambulatory Visit: Payer: Medicare Other | Admitting: Rheumatology

## 2020-10-08 ENCOUNTER — Telehealth: Payer: Self-pay | Admitting: Gastroenterology

## 2020-10-08 NOTE — Telephone Encounter (Signed)
Hi Dr. Loletha Carrow,  We received a referral from PCP for patient to get a repeat colonoscopy. Had one done with [facility] in 2017 obtained op\path reports for you to review.    Please advise on scheduling.  Thank you

## 2020-10-12 ENCOUNTER — Other Ambulatory Visit: Payer: Medicare Other

## 2020-10-14 ENCOUNTER — Other Ambulatory Visit: Payer: Medicare Other

## 2020-10-16 ENCOUNTER — Encounter: Payer: Self-pay | Admitting: Internal Medicine

## 2020-10-16 ENCOUNTER — Other Ambulatory Visit: Payer: Self-pay | Admitting: Internal Medicine

## 2020-10-16 DIAGNOSIS — E559 Vitamin D deficiency, unspecified: Secondary | ICD-10-CM

## 2020-10-16 MED ORDER — VITAMIN D (ERGOCALCIFEROL) 1.25 MG (50000 UNIT) PO CAPS: 50000.0000 [IU] | ORAL_CAPSULE | ORAL | 0 refills | Status: DC

## 2020-10-19 ENCOUNTER — Telehealth: Payer: Self-pay

## 2020-10-19 NOTE — Telephone Encounter (Signed)
Received a fax requesting a refill on the quetiapine 100 mg but according to last notes pt is on 50mg  and it wasn't order by you.  Were you going to take over prescribing for pt.    QUEtiapine (SEROQUEL) 50 MG tablet Medication Date: 08/26/2020 Department: [place] [facility] [place]: Lamarr Lulas, CMA Authorizing: [provider]    Order Providers  Documenting Provider Encounter Provider  [provider] Isaac Bliss, Rayford Halsted, MD   Outpatient Medication Detail   Disp Refills Start End   QUEtiapine (SEROQUEL) 50 MG tablet       Sig - Route: Take 50 mg by mouth at bedtime. - Oral   Class: Historical Med    Pharmacy  [id number] - [place], [place] - [address]

## 2020-10-19 NOTE — Telephone Encounter (Signed)
Could you ask her if she does need this refill from me- I think she declined it at the last visit.

## 2020-10-20 ENCOUNTER — Telehealth: Payer: Self-pay | Admitting: Internal Medicine

## 2020-10-20 DIAGNOSIS — E559 Vitamin D deficiency, unspecified: Secondary | ICD-10-CM

## 2020-10-20 MED ORDER — VITAMIN D (ERGOCALCIFEROL) 1.25 MG (50000 UNIT) PO CAPS: 50000.0000 [IU] | ORAL_CAPSULE | ORAL | 0 refills | Status: AC

## 2020-10-20 NOTE — Telephone Encounter (Signed)
Rx resent.

## 2020-10-20 NOTE — Telephone Encounter (Signed)
left message for patient to call office in regards to receiving fax that you needed refill on the quetiapine

## 2020-10-20 NOTE — Telephone Encounter (Signed)
called left another message for pt to call back to let us know if we need to refill or if someone else is filling for her

## 2020-10-20 NOTE — Telephone Encounter (Signed)
Patient is calling to advise [facility] not [facility] to [facility] her Vitamin D.  Ph[facility]acy: [facility].  Please advise.

## 2020-10-21 NOTE — Telephone Encounter (Signed)
pt called she states that Dr. Modesta Messing is the one that give her the medication  but at this time she is ok on the medication and she does not needs a refill.

## 2020-10-21 NOTE — Telephone Encounter (Signed)
I reviewed the records from [facility]  (For documentation purposes only -screening colonoscopy by Dr. Paulita Fujita on 01/06/2016, complete exam to cecum with good preparation.  3 subcentimeter tubular adenomas removed)  Based on these records, the patient is due for surveillance colonoscopy.  It can be directly booked with me via the [facility].  Please pass her chart to [place] to make arrangements.  - HD

## 2020-10-23 ENCOUNTER — Encounter: Payer: Self-pay | Admitting: Gastroenterology

## 2020-11-05 NOTE — Progress Notes (Signed)
Virtual Visit via Video Note  I connected with Miranda Harris on 11/12/20 at  8:40 AM EST by a video enabled telemedicine application and verified that I am speaking with the correct person using two identifiers.  Location: Patient: home Provider: office Persons participated in the visit- patient, provider   I discussed the limitations of evaluation and management by telemedicine and the availability of in person appointments. The patient expressed understanding and agreed to proceed.    I discussed the assessment and treatment plan with the patient. The patient was provided an opportunity to ask questions and all were answered. The patient agreed with the plan and demonstrated an understanding of the instructions.   The patient was advised to call back or seek an in-person evaluation if the symptoms worsen or if the condition fails to improve as anticipated.  I provided 20 minutes of non-face-to-face time during this encounter.   [name], MD    [facility] MD/PA/NP OP Progress Note  11/12/2020 9:06 AM Miranda Harris  MRN:  [id number]  Chief Complaint:  Chief Complaint    Follow-up; Other     HPI:  This is a follow-up appointment for bipolar disorder. She states that she has been doing good. She visit her mother-in-law in [place], who went to hospice. She decided not to go there again except the funeral, stating that it is family time, and she is done. She understands how her husband feels about the situation as she lost her parents in the past. She states that there are many things to be done here, including colonoscopy, and working on house and property. She has been taking quetiapine 100 mg since she went to [place] due to insomnia. She is not interested in trying other hypnotics due to previous adverse reaction in the past. She believes the current dose of quetiapine works very well for her, and would like to continue the same despite understanding its potential side  effect. She has middle insomnia. She denies feeling depressed. She denies anhedonia. She denies SI. She denies decreased need for sleep or euphoria.    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 Bipolar 1 disorder (HCC)  F31.9 PARoxetine (PAXIL) 10 MG tablet  2. Insomnia, unspecified type  G47.00     Past Psychiatric History: Please see initial evaluation for full details. I have reviewed the history. No updates at this time.     Past Medical History:  Past Medical History:  Diagnosis Date  . Bipolar disorder ([place])   . Elevated serum creatinine    related to lithium use  . Helicobacter pylori (H. pylori) infection    pt denies  . Hypothyroidism   . Perirectal fistula   . Thyroid disease   . Trochanteric bursitis of right hip     Past Surgical History:  Procedure Laterality Date  . EVALUATION UNDER ANESTHESIA WITH HEMORRHOIDECTOMY N/A 06/28/2017   Procedure: EXAM UNDER ANESTHESIA WITH EXTERNAL HEMORRHOIDECTOMY AND REPAIR OF PERIRECTAL FISTULA;  Surgeon: Michael Boston, MD;  Location: [facility];  Service: General;  Laterality: N/A;  . HERNIA REPAIR  9-37 yrs ago   umbilical hernia  . INCISION  AND DRAINAGE PERIRECTAL ABSCESS N/A 04/18/2017   Procedure: IRRIGATION AND DEBRIDEMENT PERIRECTAL ABSCESS, placement of seton, sigmoidoscopy;  Surgeon: Excell Seltzer, MD;  Location: [facility];  Service: General;  Laterality: N/A;  . LAPAROSCOPIC APPENDECTOMY    . VAGINAL HYSTERECTOMY Bilateral    complete  . WISDOM TOOTH EXTRACTION      Family Psychiatric History: Please see initial evaluation for full details. I have reviewed the history. No updates at this time.      Family History:  Family History  Problem Relation Age of Onset  . Anxiety disorder Brother   . Breast cancer Neg Hx     Social History:  Social History   Socioeconomic History  . Marital status: Married    Spouse name: Not on file  . Number of children: Not on file  . Years of education: Not on file  . Highest education level: Not on file  Occupational History  . Not on file  Tobacco Use  . Smoking status: Former Smoker    Packs/day: 1.00    Years: 30.00    Pack years: 30.00    Types: Cigarettes  . Smokeless tobacco: Never Used  . Tobacco comment: quit 2008  Vaping Use  . Vaping Use: Never used  Substance and Sexual Activity  . Alcohol use: No    Alcohol/week: 0.0 standard drinks    Comment: Occasional use  . Drug use: No  . Sexual activity: Yes    Partners: Male    Birth control/protection: Surgical  Other Topics Concern  . Not on file  Social History Narrative  . Not on file   Social Determinants of Health   Financial Resource Strain: Not on file  Food Insecurity: Not on file  Transportation Needs: Not on file  Physical Activity: Not on file  Stress: Not on file  Social Connections: Not on file    Allergies:  Allergies  Allergen Reactions  . Tetracyclines & Related Rash    Metabolic Disorder Labs: Lab Results  Component Value Date   HGBA1C 5.5 09/04/2020   MPG 108 06/10/2015   No results found for: PROLACTIN Lab Results  Component Value Date   CHOL 209 (H) 09/04/2020   TRIG 75.0 09/04/2020   HDL 71.90 09/04/2020   CHOLHDL 3 09/04/2020   VLDL 15.0 09/04/2020   LDLCALC 122 (H) 09/04/2020   LDLCALC 130 (H) 11/30/2018   Lab Results  Component Value Date   TSH 9.98 (H) 09/04/2020   TSH 1.94 11/29/2019    Therapeutic Level Labs: Lab Results  Component Value Date   LITHIUM 1.2 11/08/2018   LITHIUM 0.80 06/10/2015   No results found for: VALPROATE No components found for:  CBMZ  Current Medications: Current Outpatient  Medications  Medication Sig Dispense Refill  . [START ON 11/15/2020] lamoTRIgine 50 MG TBDP Take 1 tablet (50 mg total) by mouth daily. 30 tablet 0  . Calcium Carbonate-Vitamin D (CALTRATE 600+D PO) Take 2 tablets by mouth daily.    [name] lamoTRIgine 50 MG TBDP Take 1 tablet by mouth in the morning.    [name] levothyroxine (SYNTHROID) 175 MCG tablet Take 1 tablet (175 mcg total) by mouth daily. 90 tablet 1  . Multiple Vitamin (MULTIVITAMIN WITH MINERALS) TABS tablet Take 1 tablet by mouth daily.    . Omega-3 Fatty Acids (FISH OIL) 1000 MG CAPS Take 2,000 mg by mouth daily.    Derrill Memo ON 11/15/2020] PARoxetine (PAXIL) 10 MG tablet Take 1 tablet (10 mg total) by mouth daily.  90 tablet 0  . QUERCETIN PO Take 2 tablets by mouth in the morning.    [name] QUEtiapine (SEROQUEL) 50 MG tablet Take 50 mg by mouth at bedtime.    . Vitamin D, Ergocalciferol, (DRISDOL) 1.25 MG (50000 UNIT) CAPS capsule Take 1 capsule (50,000 Units total) by mouth every 7 (seven) days for 12 doses. 12 capsule 0   No current facility-administered medications for this visit.     Musculoskeletal: Strength & Muscle Tone: N/A Gait & Station: N/A Patient leans: N/A  Psychiatric Specialty Exam: Review of Systems  Psychiatric/Behavioral: Positive for sleep disturbance. Negative for agitation, behavioral problems, confusion, decreased concentration, dysphoric mood, hallucinations, self-injury and suicidal ideas. The patient is not nervous/anxious and is not hyperactive.   All other systems reviewed and are negative.   There were no vitals taken for this visit.There is no height or weight on file to calculate BMI.  General Appearance: Fairly Groomed  Eye Contact:  Good  Speech:  Clear and Coherent  Volume:  Normal  Mood:  good  Affect:  Appropriate, Congruent and euthymic  Thought Process:  Coherent  Orientation:  Full (Time, Place, and Person)  Thought Content: Logical   Suicidal Thoughts:  No  Homicidal Thoughts:  No  Memory:   Immediate;   Good  Judgement:  Good  Insight:  Good  Psychomotor Activity:  Normal  Concentration:  Concentration: Good and Attention Span: Good  Recall:  Good  Fund of Knowledge: Good  Language: Good  Akathisia:  No  Handed:  Right  AIMS (if indicated): not done  Assets:  Communication Skills Desire for Improvement  ADL's:  Intact  Cognition: WNL  Sleep:  Fair   Screenings: PHQ2-9   Flowsheet Row Video Visit from 11/12/2020 in [facility] Visit from 12/13/2019 in [facility] at [place] from 11/08/2018 in [facility] at [place]  PHQ-2 Total Score 0 0 0  PHQ-9 Total Score - 6 -    Flowsheet Row Video Visit from 11/12/2020 in [facility]       Assessment and Plan:  Miranda Harris is a 60 y.o. 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    1. Bipolar I disorder ([place]) She denies significant mood symptoms, and reports improvement in drowsiness since tapering down Paxil. Will continue current medication regimen except that to increase quetiapine to target insomnia as described below. Will continue Paxil for depressive symptoms. Will continue lamotrigine for bipolar disorder. She is aware of its risk of Stevens-Johnson syndrome. Will continue quetiapine for bipolar disorder. Discussed potential metabolic side effect and EPS.   2. Insomnia, unspecified type She self uptitrated quetiapine for insomnia, and reports good benefit from the current dose. Although sleep evaluation was recommended in the past, she is not interested in pursuing this. She is also not interested in trying other hypnotics, although it was strongly recommended given potential long-term side effect from quetiapine as  described above. Will continue current dose of quetiapine.   Plan:  I have reviewed and updated plans as below 1. Continue Paxil 10 mgat night (drowsiness from 20 mg) 2. Continue lamotrigine 50 mg daily  3.Increase quetiapine100 mg at night(headache, fatigue at higher dose) - she declined refill 4.Next appointment:5/19 at 8:40 for 20 mins, vidoe  Past trials of medication: Sertraline,fluoxetine (drowsiness),Lexapro,Duloxetine,venlafaxine (  initially worked well, stopped working, hair loss),Wellbutrin(insomnia),Latuda,Abilify (flu like symptoms), Haldol (EPS), Thorazine (EPS), Stelazine, Lithium, Lunesta (sedation), Ambien (sleepwalking, driving, eating), Trazodone (excessive sedation), Xanax  The patient demonstrates the following risk factors for suicide: Chronic risk factorsfor suicide include psychiatric disorder /bipolar disorder,history of substance use disorder, family history of suicide attempt. Acute risk factorsfor suicide includenone.Protective factorsfor this patient include positive social support, responsibility to others (family), coping skills, hope for the future. Although patient reports passive SI, she adamantly denies any intent and has had no previous suicide attempt.Considering these factors, the overall suicide risk at this point appears to be low.  [name], MD 11/12/2020, 9:06 AM

## 2020-11-12 ENCOUNTER — Telehealth (INDEPENDENT_AMBULATORY_CARE_PROVIDER_SITE_OTHER): Payer: 59 | Admitting: Psychiatry

## 2020-11-12 ENCOUNTER — Encounter: Payer: Self-pay | Admitting: Psychiatry

## 2020-11-12 ENCOUNTER — Other Ambulatory Visit: Payer: Self-pay

## 2020-11-12 DIAGNOSIS — G4700 Insomnia, unspecified: Secondary | ICD-10-CM | POA: Diagnosis not present

## 2020-11-12 DIAGNOSIS — F319 Bipolar disorder, unspecified: Secondary | ICD-10-CM

## 2020-11-12 MED ORDER — LAMOTRIGINE 50 MG PO TBDP: 50.0000 mg | ORAL_TABLET | Freq: Every day | ORAL | 0 refills | Status: DC

## 2020-11-12 MED ORDER — PAROXETINE HCL 10 MG PO TABS
10.0000 mg | ORAL_TABLET | Freq: Every day | ORAL | 0 refills | Status: DC
Start: 2020-11-15 — End: 2021-02-11

## 2020-11-12 NOTE — Patient Instructions (Signed)
1. Continue Paxil 10 mgat night 2. Continue lamotrigine 50 mg daily  3.Increase quetiapine100 mg at night 4.Next appointment:5/19 at 8:40

## 2020-12-07 ENCOUNTER — Ambulatory Visit (AMBULATORY_SURGERY_CENTER): Payer: Medicare Other | Admitting: *Deleted

## 2020-12-07 ENCOUNTER — Other Ambulatory Visit: Payer: Self-pay

## 2020-12-07 VITALS — Ht 68.0 in | Wt 180.0 lb

## 2020-12-07 DIAGNOSIS — Z86010 Personal history of colonic polyps: Secondary | ICD-10-CM

## 2020-12-07 MED ORDER — PEG-KCL-NACL-NASULF-NA ASC-C 100 G PO SOLR: 1.0000 | Freq: Once | ORAL | 0 refills | Status: AC

## 2020-12-07 NOTE — Progress Notes (Signed)
Patient's pre-visit was done today over the phone with the patient due to COVID-19 pandemic. Name,DOB and address verified. Insurance verified. Patient denies any allergies to Eggs and Soy. Patient denies any problems with anesthesia/sedation. Patient denies taking diet pills or blood thinners. Packet of Prep instructions mailed to patient including a copy of a consent form-pt is aware. Patient understands to call us back with any questions or concerns. The patient is COVID-19 fully vaccinated, per patient. Patient is aware of our care-partner policy and Covid-19 safety protocol. EMMI education assigned to the patient for the procedure, sent to MyChart.  

## 2020-12-21 ENCOUNTER — Encounter: Payer: Self-pay | Admitting: Gastroenterology

## 2020-12-21 ENCOUNTER — Other Ambulatory Visit: Payer: Self-pay

## 2020-12-21 ENCOUNTER — Ambulatory Visit (AMBULATORY_SURGERY_CENTER): Payer: 59 | Admitting: Gastroenterology

## 2020-12-21 VITALS — BP 130/95 | HR 61 | Temp 97.6°F | Resp 12 | Ht 68.0 in | Wt 180.0 lb

## 2020-12-21 DIAGNOSIS — D123 Benign neoplasm of transverse colon: Secondary | ICD-10-CM | POA: Diagnosis not present

## 2020-12-21 DIAGNOSIS — Z86010 Personal history of colonic polyps: Secondary | ICD-10-CM

## 2020-12-21 MED ORDER — SODIUM CHLORIDE 0.9 % IV SOLN: 500.0000 mL | Freq: Once | INTRAVENOUS | Status: DC

## 2020-12-21 NOTE — Op Note (Signed)
[facility] Patient Name: Miranda Harris Procedure Date: 12/21/2020 8:54 AM MRN: [id number] Endoscopist: Mallie Mussel L. Loletha Carrow , MD Age: 60 Referring MD:  Date of Birth: 1961-04-26 Gender: Female Account #: [id number] Procedure:                Colonoscopy Indications:              Surveillance: Personal history of adenomatous                            polyps on last colonoscopy 5 years ago (TA , 76mm x                            3, April 2017) Medicines:                Monitored Anesthesia Care Procedure:                Pre-Anesthesia Assessment:                           - Prior to the procedure, a History and Physical                            was performed, and patient medications and                            allergies were reviewed. The patient's tolerance of                            previous anesthesia was also reviewed. The risks                            and benefits of the procedure and the sedation                            options and risks were discussed with the patient.                            All questions were answered, and informed consent                            was obtained. Prior Anticoagulants: The patient has                            taken no previous anticoagulant or antiplatelet                            agents. ASA Grade Assessment: II - A patient with                            mild systemic disease. After reviewing the risks                            and benefits, the patient was deemed in  satisfactory condition to undergo the procedure.                           After obtaining informed consent, the colonoscope                            was passed under direct vision. Throughout the                            procedure, the patient's blood pressure, pulse, and                            oxygen saturations were monitored continuously. The                            Olympus [id number]) [id number] was  introduced                            through the anus and advanced to the the cecum,                            identified by appendiceal orifice and ileocecal                            valve. The colonoscopy was somewhat difficult due                            to a redundant colon. The patient tolerated the                            procedure well. The quality of the bowel                            preparation was good after lavage. The ileocecal                            valve, appendiceal orifice, and rectum were                            photographed. The bowel preparation used was Plenvu. Scope In: 9:08:37 AM Scope Out: 9:27:43 AM Scope Withdrawal Time: 0 hours 14 minutes 29 seconds  Total Procedure Duration: 0 hours 19 minutes 6 seconds  Findings:                 The perianal and digital rectal examinations were                            normal except for palpable anal canal fibrosis from                            prior surgery.                           Multiple small-mouthed diverticula were found in  the left colon.                           A 4 mm polyp was found in the distal transverse                            colon. The polyp was semi-sessile. The polyp was                            removed with a cold snare. Resection and retrieval                            were complete.                           The exam was otherwise without abnormality on                            direct and retroflexion views. Complications:            No immediate complications. Estimated Blood Loss:     Estimated blood loss was minimal. Impression:               - Diverticulosis in the left colon.                           - One 4 mm polyp in the distal transverse colon,                            removed with a cold snare. Resected and retrieved.                           - The examination was otherwise normal on direct                            and  retroflexion views. Recommendation:           - Patient has a contact number available for                            emergencies. The signs and symptoms of potential                            delayed complications were discussed with the                            patient. Return to normal activities tomorrow.                            Written discharge instructions were provided to the                            patient.                           - Resume previous diet.                           -  Continue present medications.                           - Await pathology results.                           - Repeat colonoscopy is recommended for                            surveillance. The colonoscopy date will be                            determined after pathology results from today's                            exam become available for review. (ducolax 20 mg                            before evening prep dose for next colonoscopy due                            to divericulosis) Nolita Kutter L. Loletha Carrow, MD 12/21/2020 9:32:49 AM This report has been signed electronically.

## 2020-12-21 NOTE — Patient Instructions (Signed)
Please read handouts provided. Continue present medications. Await pathology results.   YOU HAD AN ENDOSCOPIC PROCEDURE TODAY AT THE [facility]:   Refer to the procedure report that was given to you for any specific questions about what was found during the examination.  If the procedure report does not answer your questions, please call your gastroenterologist to clarify.  If you requested that your care partner not be given the details of your procedure findings, then the procedure report has been included in a sealed envelope for you to review at your convenience later.  YOU SHOULD EXPECT: Some feelings of bloating in the abdomen. Passage of more gas than usual.  Walking can help get rid of the air that was put into your GI tract during the procedure and reduce the bloating. If you had a lower endoscopy (such as a colonoscopy or flexible sigmoidoscopy) you may notice spotting of blood in your stool or on the toilet paper. If you underwent a bowel prep for your procedure, you may not have a normal bowel movement for a few days.  Please Note:  You might notice some irritation and congestion in your nose or some drainage.  This is from the oxygen used during your procedure.  There is no need for concern and it should clear up in a day or so.  SYMPTOMS TO REPORT IMMEDIATELY:  Following lower endoscopy (colonoscopy or flexible sigmoidoscopy):  Excessive amounts of blood in the stool  Significant tenderness or worsening of abdominal pains  Swelling of the abdomen that is new, acute  Fever of 100F or higher   For urgent or emergent issues, a gastroenterologist can be reached at any hour by calling [phone]. Do not use MyChart messaging for urgent concerns.    DIET:  We do recommend a small meal at first, but then you may proceed to your regular diet.  Drink plenty of fluids but you should avoid alcoholic beverages for 24 hours.  ACTIVITY:  You should plan to take it easy  for the rest of today and you should NOT DRIVE or use heavy machinery until tomorrow (because of the sedation medicines used during the test).    FOLLOW UP: Our staff will call the number listed on your records 48-72 hours following your procedure to check on you and address any questions or concerns that you may have regarding the information given to you following your procedure. If we do not reach you, we will leave a message.  We will attempt to reach you two times.  During this call, we will ask if you have developed any symptoms of COVID 19. If you develop any symptoms (ie: fever, flu-like symptoms, shortness of breath, cough etc.) before then, please call [phone].  If you test positive for Covid 19 in the 2 weeks post procedure, please call and report this information to us.    If any biopsies were taken you will be contacted by phone or by letter within the next 1-3 weeks.  Please call us at [phone] if you have not heard about the biopsies in 3 weeks.    SIGNATURES/CONFIDENTIALITY: You and/or your care partner have signed paperwork which will be entered into your electronic medical record.  These signatures attest to the fact that that the information above on your After Visit Summary has been reviewed and is understood.  Full responsibility of the confidentiality of this discharge information lies with you and/or your care-partner.  

## 2020-12-21 NOTE — Progress Notes (Signed)
Called to room to assist during endoscopic procedure.  Patient ID and intended procedure confirmed with present staff. Received instructions for my participation in the procedure from the performing physician.  

## 2020-12-21 NOTE — Progress Notes (Signed)
Pt's states no medical or surgical changes since previsit or office visit. 

## 2020-12-21 NOTE — Progress Notes (Signed)
To PACU, VSS. Report to [email or web address] 

## 2020-12-22 ENCOUNTER — Encounter: Payer: Self-pay | Admitting: Internal Medicine

## 2020-12-22 DIAGNOSIS — N1832 Chronic kidney disease, stage 3b: Secondary | ICD-10-CM

## 2020-12-23 ENCOUNTER — Telehealth: Payer: Self-pay | Admitting: *Deleted

## 2020-12-23 NOTE — Telephone Encounter (Signed)
  Follow up Call-  Call back number 12/21/2020  Post procedure Call Back phone  # [phone]  Permission to leave phone message Yes  Some recent data might be hidden     Patient questions:  Do you have a fever, pain , or abdominal swelling? No. Pain Score  0 *  Have you tolerated food without any problems? Yes.    Have you been able to return to your normal activities? Yes.    Do you have any questions about your discharge instructions: Diet   No. Medications  No. Follow up visit  No.  Do you have questions or concerns about your Care? No.  Actions: * If pain score is 4 or above: No action needed, pain <4.  1. Have you developed a fever since your procedure? no  2.   Have you had an respiratory symptoms (SOB or cough) since your procedure? no  3.   Have you tested positive for COVID 19 since your procedure no  4.   Have you had any family members/close contacts diagnosed with the COVID 19 since your procedure?  No   If yes to any of these questions please route to Joylene John, RN and Joella Prince, RN

## 2020-12-25 ENCOUNTER — Other Ambulatory Visit (INDEPENDENT_AMBULATORY_CARE_PROVIDER_SITE_OTHER): Payer: 59

## 2020-12-25 ENCOUNTER — Other Ambulatory Visit: Payer: Self-pay

## 2020-12-25 DIAGNOSIS — E559 Vitamin D deficiency, unspecified: Secondary | ICD-10-CM

## 2020-12-25 DIAGNOSIS — E039 Hypothyroidism, unspecified: Secondary | ICD-10-CM

## 2020-12-25 DIAGNOSIS — N1832 Chronic kidney disease, stage 3b: Secondary | ICD-10-CM

## 2020-12-25 DIAGNOSIS — E785 Hyperlipidemia, unspecified: Secondary | ICD-10-CM | POA: Diagnosis not present

## 2020-12-25 LAB — BASIC METABOLIC PANEL
BUN: 24 mg/dL — ABNORMAL HIGH (ref 6–23)
CO2: 24 mEq/L (ref 19–32)
Calcium: 9.9 mg/dL (ref 8.4–10.5)
Chloride: 105 mEq/L (ref 96–112)
Creatinine, Ser: 1.19 mg/dL (ref 0.40–1.20)
GFR: 49.52 mL/min — ABNORMAL LOW (ref >60.00)
Glucose, Bld: 95 mg/dL (ref 70–99)
Potassium: 4.8 mEq/L (ref 3.5–5.1)
Sodium: 138 mEq/L (ref 135–145)

## 2020-12-25 LAB — LIPID PANEL
Cholesterol: 205 mg/dL — ABNORMAL HIGH (ref 0–200)
HDL: 73.3 mg/dL (ref >39.00)
LDL Cholesterol: 116 mg/dL — ABNORMAL HIGH (ref 0–99)
NonHDL: 131.43
Total CHOL/HDL Ratio: 3
Triglycerides: 76 mg/dL (ref 0.0–149.0)
VLDL: 15.2 mg/dL (ref 0.0–40.0)

## 2020-12-25 LAB — TSH: TSH: 1.64 u[IU]/mL (ref 0.35–4.50)

## 2020-12-25 LAB — VITAMIN D 25 HYDROXY (VIT D DEFICIENCY, FRACTURES): VITD: 35.06 ng/mL (ref 30.00–100.00)

## 2020-12-31 ENCOUNTER — Encounter: Payer: Self-pay | Admitting: Gastroenterology

## 2021-01-27 ENCOUNTER — Ambulatory Visit: Payer: Medicare Other | Admitting: Internal Medicine

## 2021-02-03 NOTE — Progress Notes (Addendum)
Virtual Visit via Video Note  I connected with Miranda Harris on 02/11/21 at  8:40 AM EDT by a video enabled telemedicine application and verified that I am speaking with the correct person using two identifiers.  Location: Patient: home Provider: office Persons participated in the visit- patient, provider   I discussed the limitations of evaluation and management by telemedicine and the availability of in person appointments. The patient expressed understanding and agreed to proceed.    I discussed the assessment and treatment plan with the patient. The patient was provided an opportunity to ask questions and all were answered. The patient agreed with the plan and demonstrated an understanding of the instructions.   The patient was advised to call back or seek an in-person evaluation if the symptoms worsen or if the condition fails to improve as anticipated.  I provided 15 minutes of non-face-to-face time during this encounter.   [name], MD     [facility] MD/PA/NP OP Progress Note  02/11/2021 9:36 AM Miranda Harris  MRN:  [id number]  Chief Complaint:  Chief Complaint    Follow-up; Other     HPI:  This is a follow-up appointment for bipolar disorder and insomnia.  She states that her mother-in-law finally passed several weeks ago.  She went to [place], and returned a few weeks ago.  She feels relieved, stating that weights has been lifted off from the shoulder, although she did mourn for around a week since she passed.  She also feels better to see her husband appearing to be happier.  She thinks that her mood has been really good.  She is doing Psychologist, occupational work at [employer] in [place].  She wants the visit to be every 5 months due to changing her insurance/financial strain.  She prefers medication to be handled by this clinician rather than her PCP.  She denies feeling depressed.  She sleeps well.  She denies anxiety.  She denies anhedonia.  She denies SI.   She denies decreased need for sleep or euphonia.  She has no change in her appetite/weight.   180- 185 lbs Wt Readings from Last 3 Encounters:  12/21/20 180 lb (81.6 kg)  12/07/20 180 lb (81.6 kg)  08/26/20 186 lb 8 oz (84.6 kg)    Visit Diagnosis:    ICD-10-CM   1. Insomnia, unspecified type  G47.00   2. Bipolar 1 disorder (HCC)  F31.9 PARoxetine (PAXIL) 10 MG tablet    Past Psychiatric History: Please see initial evaluation for full details. I have reviewed the history. No updates at this time.     Past Medical History:  Past Medical History:  Diagnosis Date  . Bipolar disorder ([place])   . Blood transfusion without reported diagnosis   . Chronic kidney disease   . Depression   . Elevated serum creatinine    related to lithium use  . Helicobacter pylori (H. pylori) infection    pt denies  . Hypothyroidism   . Perirectal fistula   . Thyroid disease   . Trochanteric bursitis of right hip     Past Surgical History:  Procedure Laterality Date  . COLONOSCOPY  01/06/2016   at [facility]  . EVALUATION UNDER ANESTHESIA WITH HEMORRHOIDECTOMY N/A 06/28/2017   Procedure: EXAM UNDER ANESTHESIA WITH EXTERNAL HEMORRHOIDECTOMY AND REPAIR OF PERIRECTAL FISTULA;  Surgeon: Michael Boston, MD;  Location: [place];  Service: General;  Laterality: N/A;  . HERNIA REPAIR  [id number] yrs ago   umbilical hernia  .  INCISION AND DRAINAGE PERIRECTAL ABSCESS N/A 04/18/2017   Procedure: IRRIGATION AND DEBRIDEMENT PERIRECTAL ABSCESS, placement of seton, sigmoidoscopy;  Surgeon: Excell Seltzer, MD;  [facility];  Service: General;  Laterality: N/A;  . LAPAROSCOPIC APPENDECTOMY    . POLYPECTOMY    . VAGINAL HYSTERECTOMY Bilateral    complete  . WISDOM TOOTH EXTRACTION      Family Psychiatric History: Please see initial evaluation for full details. I have reviewed the history. No updates at this time.     Family History:  Family History  Problem Relation Age of Onset  . Anxiety  disorder Brother   . Breast cancer Neg Hx   . Colon cancer Neg Hx   . Colon polyps Neg Hx   . Esophageal cancer Neg Hx   . Rectal cancer Neg Hx   . Stomach cancer Neg Hx     Social History:  Social History   Socioeconomic History  . Marital status: Married    Spouse name: Not on file  . Number of children: Not on file  . Years of education: Not on file  . Highest education level: Not on file  Occupational History  . Not on file  Tobacco Use  . Smoking status: Former Smoker    Packs/day: 1.00    Years: 30.00    Pack years: 30.00    Types: Cigarettes  . Smokeless tobacco: Never Used  . Tobacco comment: quit 2008  Vaping Use  . Vaping Use: Never used  Substance and Sexual Activity  . Alcohol use: Yes    Comment: once a month per pt  . Drug use: No  . Sexual activity: Yes    Partners: Male    Birth control/protection: Surgical  Other Topics Concern  . Not on file  Social History Narrative  . Not on file   Social Determinants of Health   Financial Resource Strain: Not on file  Food Insecurity: Not on file  Transportation Needs: Not on file  Physical Activity: Not on file  Stress: Not on file  Social Connections: Not on file    Allergies:  Allergies  Allergen Reactions  . Metrogel [Metronidazole] Other (See Comments)    Burn face  . Tetracyclines & Related Rash    Metabolic Disorder Labs: Lab Results  Component Value Date   HGBA1C 5.5 09/04/2020   MPG 108 06/10/2015   No results found for: PROLACTIN Lab Results  Component Value Date   CHOL 205 (H) 12/25/2020   TRIG 76.0 12/25/2020   HDL 73.30 12/25/2020   CHOLHDL 3 12/25/2020   VLDL 15.2 12/25/2020   LDLCALC 116 (H) 12/25/2020   LDLCALC 122 (H) 09/04/2020   Lab Results  Component Value Date   TSH 1.64 12/25/2020   TSH 9.98 (H) 09/04/2020    Therapeutic Level Labs: Lab Results  Component Value Date   LITHIUM 1.2 11/08/2018   LITHIUM 0.80 06/10/2015   No results found for: VALPROATE No  components found for:  CBMZ  Current Medications: Current Outpatient Medications  Medication Sig Dispense Refill  . Calcium Carbonate-Vitamin D (CALTRATE 600+D PO) Take 2 tablets by mouth daily.    [name] lamoTRIgine 50 MG TBDP Take 1 tablet (50 mg total) by mouth daily. 90 tablet 1  . levothyroxine (SYNTHROID) 175 MCG tablet Take 1 tablet (175 mcg total) by mouth daily. 90 tablet 1  . Multiple Vitamin (MULTIVITAMIN WITH MINERALS) TABS tablet Take 1 tablet by mouth daily.    . Omega-3 Fatty Acids (FISH OIL)  1000 MG CAPS Take 2,000 mg by mouth daily.    [name] PARoxetine (PAXIL) 10 MG tablet Take 1 tablet (10 mg total) by mouth daily. 90 tablet 1  . QUEtiapine (SEROQUEL) 100 MG tablet Take 1 tablet (100 mg total) by mouth at bedtime. 90 tablet 1   No current facility-administered medications for this visit.     Musculoskeletal: Strength & Muscle Tone: N/A Gait & Station: N/A Patient leans: N/A  Psychiatric Specialty Exam: Review of Systems  Psychiatric/Behavioral: Negative for agitation, behavioral problems, confusion, decreased concentration, dysphoric mood, hallucinations, self-injury, sleep disturbance and suicidal ideas. The patient is not nervous/anxious and is not hyperactive.   All other systems reviewed and are negative.   There were no vitals taken for this visit.There is no height or weight on file to calculate BMI.  General Appearance: Fairly Groomed  Eye Contact:  Good  Speech:  Clear and Coherent  Volume:  Normal  Mood:  good  Affect:  Appropriate, Congruent and Full Range  Thought Process:  Coherent  Orientation:  Full (Time, Place, and Person)  Thought Content: Logical   Suicidal Thoughts:  No  Homicidal Thoughts:  No  Memory:  Immediate;   Good  Judgement:  Good  Insight:  Good  Psychomotor Activity:  Normal  Concentration:  Concentration: Good and Attention Span: Good  Recall:  Good  Fund of Knowledge: Good  Language: Good  Akathisia:  No  Handed:  Right  AIMS  (if indicated): not done  Assets:  Communication Skills Desire for Improvement  ADL's:  Intact  Cognition: WNL  Sleep:  Good   Screenings: PHQ2-9   Flowsheet Row Video Visit from 02/11/2021 in [facility] Video Visit from 11/12/2020 in [place] Office Visit from 12/13/2019 in [place] at [place] from 11/08/2018 in [facility] at [place]  PHQ-2 Total Score 0 0 0 0  PHQ-9 Total Score -- -- 6 --    Flowsheet Row Video Visit from 02/11/2021 in [place] Video Visit from 11/12/2020 in [place] No Risk No Risk       Assessment and Plan:  Miranda Harris is a 60 y.o. 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   1. Bipolar 1 disorder ([place]) 2. Insomnia, unspecified type She denies any significant mood symptoms since the last visit.  Will continue lamotrigine for bipolar disorder.  Will continue Paxil for depression.  Will continue quetiapine for bipolar disorder and also to target insomnia. Noted that she has limited benefit/adverse reaction from hypnotics.  She politely requests to be seen every 5 months given changing her insurance/financial strain.  She agrees to contact the office if any worsening in her symptoms.   This clinician has discussed the side effect associated with medication prescribed during this encounter. Please refer to notes in the previous encounters for more details.     Plan:  I have reviewed and updated plans as below 1.ContinuePaxil 10 mgat night (drowsiness from 20 mg) 2. Continue lamotrigine 50 mg daily  3.Continue quetiapine100 mg at night(headache, fatigue at higher dose) 4.Next appointment:10/17 at  9 AM for 30 mins, in person (she prefers q 5 months visits)  Past trials of medication: Sertraline,fluoxetine (drowsiness),Lexapro,Duloxetine,venlafaxine (initially worked well, stopped working, hair loss),Wellbutrin(insomnia),Latuda,Abilify (flu like symptoms), Haldol (EPS), Thorazine (EPS), Stelazine, Lithium,  Lunesta (sedation), Ambien (sleepwalking, driving, eating), Trazodone (excessive sedation), Xanax  The patient demonstrates the following risk factors for suicide: Chronic risk factorsfor suicide include psychiatric disorder /bipolar disorder,history of substance use disorder, family history of suicide attempt. Acute risk factorsfor suicide includenone.Protective factorsfor this patient include positive social support, responsibility to others (family), coping skills, hope for the future. Although patient reports passive SI, she adamantly denies any intent and has had no previous suicide attempt.Considering these factors, the overall suicide risk at this point appears to be low.  [name], MD 02/11/2021, 9:36 AM

## 2021-02-11 ENCOUNTER — Encounter: Payer: Self-pay | Admitting: Psychiatry

## 2021-02-11 ENCOUNTER — Telehealth (INDEPENDENT_AMBULATORY_CARE_PROVIDER_SITE_OTHER): Payer: 59 | Admitting: Psychiatry

## 2021-02-11 ENCOUNTER — Other Ambulatory Visit: Payer: Self-pay

## 2021-02-11 DIAGNOSIS — F319 Bipolar disorder, unspecified: Secondary | ICD-10-CM | POA: Diagnosis not present

## 2021-02-11 DIAGNOSIS — G4700 Insomnia, unspecified: Secondary | ICD-10-CM

## 2021-02-11 MED ORDER — LAMOTRIGINE 50 MG PO TBDP: 50.0000 mg | ORAL_TABLET | Freq: Every day | ORAL | 1 refills | Status: DC

## 2021-02-11 MED ORDER — PAROXETINE HCL 10 MG PO TABS: 10.0000 mg | ORAL_TABLET | Freq: Every day | ORAL | 1 refills | Status: DC

## 2021-02-11 MED ORDER — QUETIAPINE FUMARATE 100 MG PO TABS: 100.0000 mg | ORAL_TABLET | Freq: Every day | ORAL | 1 refills | Status: DC

## 2021-02-11 NOTE — Patient Instructions (Signed)
1.ContinuePaxil 10 mgat night 2. Continue lamotrigine 50 mg daily  3.Continuequetiapine100 mg at night 4.Next appointment:10/17 at 9 AM  in person  The next visit will be in person visit. Please arrive 15 mins before the scheduled time.   [facility]  Address: [address], [place], [place] [address]

## 2021-02-23 ENCOUNTER — Other Ambulatory Visit: Payer: Self-pay | Admitting: Internal Medicine

## 2021-02-23 DIAGNOSIS — E039 Hypothyroidism, unspecified: Secondary | ICD-10-CM

## 2021-03-04 ENCOUNTER — Encounter: Payer: Self-pay | Admitting: Internal Medicine

## 2021-03-04 DIAGNOSIS — E039 Hypothyroidism, unspecified: Secondary | ICD-10-CM

## 2021-03-04 DIAGNOSIS — E559 Vitamin D deficiency, unspecified: Secondary | ICD-10-CM

## 2021-03-08 ENCOUNTER — Other Ambulatory Visit (INDEPENDENT_AMBULATORY_CARE_PROVIDER_SITE_OTHER): Payer: 59

## 2021-03-08 ENCOUNTER — Other Ambulatory Visit: Payer: Self-pay

## 2021-03-08 DIAGNOSIS — E039 Hypothyroidism, unspecified: Secondary | ICD-10-CM | POA: Diagnosis not present

## 2021-03-08 DIAGNOSIS — E559 Vitamin D deficiency, unspecified: Secondary | ICD-10-CM | POA: Diagnosis not present

## 2021-03-08 LAB — VITAMIN D 25 HYDROXY (VIT D DEFICIENCY, FRACTURES): VITD: 39.14 ng/mL (ref 30.00–100.00)

## 2021-03-08 LAB — TSH: TSH: 1.32 u[IU]/mL (ref 0.35–4.50)

## 2021-04-13 ENCOUNTER — Telehealth: Payer: Self-pay

## 2021-04-13 NOTE — Telephone Encounter (Signed)
pt called states seh needed a refill on the lamotrigine

## 2021-04-13 NOTE — Telephone Encounter (Signed)
Could you contact the pharmacy. She should have enough until Nov.

## 2021-05-21 ENCOUNTER — Encounter: Payer: Self-pay | Admitting: Internal Medicine

## 2021-05-21 DIAGNOSIS — E039 Hypothyroidism, unspecified: Secondary | ICD-10-CM

## 2021-05-21 MED ORDER — LEVOTHYROXINE SODIUM 175 MCG PO TABS: 175.0000 ug | ORAL_TABLET | Freq: Every day | ORAL | 1 refills | Status: DC

## 2021-05-27 ENCOUNTER — Other Ambulatory Visit: Payer: Self-pay

## 2021-05-27 NOTE — Progress Notes (Deleted)
[place] MD/PA/NP OP Progress Note  05/27/2021 5:41 PM Tahja Eidem  MRN:  [id number]  Chief Complaint:  HPI: *** Visit Diagnosis: No diagnosis found.  Past Psychiatric History: Please see initial evaluation for full details. I have reviewed the history. No updates at this time.     Past Medical History:  Past Medical History:  Diagnosis Date   Bipolar disorder ([place])    Blood transfusion without reported diagnosis    Chronic kidney disease    Depression    Elevated serum creatinine    related to lithium use   Helicobacter pylori (H. pylori) infection    pt denies   Hypothyroidism    Perirectal fistula    Thyroid disease    Trochanteric bursitis of right hip     Past Surgical History:  Procedure Laterality Date   COLONOSCOPY  01/06/2016   at [facility] WITH HEMORRHOIDECTOMY N/A 06/28/2017   Procedure: EXAM UNDER ANESTHESIA WITH EXTERNAL HEMORRHOIDECTOMY AND REPAIR OF PERIRECTAL FISTULA;  Surgeon: Michael Boston, MD;  Location: [facility];  Service: General;  Laterality: N/A;   HERNIA REPAIR  [id number] yrs ago   umbilical hernia   INCISION AND DRAINAGE PERIRECTAL ABSCESS N/A 04/18/2017   Procedure: IRRIGATION AND DEBRIDEMENT PERIRECTAL ABSCESS, placement of seton, sigmoidoscopy;  Surgeon: Excell Seltzer, MD;  [facility];  Service: General;  Laterality: N/A;   LAPAROSCOPIC APPENDECTOMY     POLYPECTOMY     VAGINAL HYSTERECTOMY Bilateral    complete   WISDOM TOOTH EXTRACTION      Family Psychiatric History: Please see initial evaluation for full details. I have reviewed the history. No updates at this time.     Family History:  Family History  Problem Relation Age of Onset   Anxiety disorder Brother    Breast cancer Neg Hx    Colon cancer Neg Hx    Colon polyps Neg Hx    Esophageal cancer Neg Hx    Rectal cancer Neg Hx    Stomach cancer Neg Hx     Social History:  Social History   Socioeconomic History   Marital  status: Married    Spouse name: Not on file   Number of children: Not on file   Years of education: Not on file   Highest education level: Not on file  Occupational History   Not on file  Tobacco Use   Smoking status: Former    Packs/day: 1.00    Years: 30.00    Pack years: 30.00    Types: Cigarettes   Smokeless tobacco: Never   Tobacco comments:    quit 2008  Vaping Use   Vaping Use: Never used  Substance and Sexual Activity   Alcohol use: Yes    Comment: once a month per pt   Drug use: No   Sexual activity: Yes    Partners: Male    Birth control/protection: Surgical  Other Topics Concern   Not on file  Social History Narrative   Not on file   Social Determinants of Health   Financial Resource Strain: Not on file  Food Insecurity: Not on file  Transportation Needs: Not on file  Physical Activity: Not on file  Stress: Not on file  Social Connections: Not on file    Allergies:  Allergies  Allergen Reactions   Metrogel [Metronidazole] Other (See Comments)    Burn face   Tetracyclines & Related Rash    Metabolic Disorder Labs: Lab Results  Component  Value Date   HGBA1C 5.5 09/04/2020   MPG 108 06/10/2015   No results found for: PROLACTIN Lab Results  Component Value Date   CHOL 205 (H) 12/25/2020   TRIG 76.0 12/25/2020   HDL 73.30 12/25/2020   CHOLHDL 3 12/25/2020   VLDL 15.2 12/25/2020   LDLCALC 116 (H) 12/25/2020   LDLCALC 122 (H) 09/04/2020   Lab Results  Component Value Date   TSH 1.32 03/08/2021   TSH 1.64 12/25/2020    Therapeutic Level Labs: Lab Results  Component Value Date   LITHIUM 1.2 11/08/2018   LITHIUM 0.80 06/10/2015   No results found for: VALPROATE No components found for:  CBMZ  Current Medications: Current Outpatient Medications  Medication Sig Dispense Refill   Calcium Carbonate-Vitamin D (CALTRATE 600+D PO) Take 2 tablets by mouth daily.     lamoTRIgine 50 MG TBDP Take 1 tablet (50 mg total) by mouth daily. 90  tablet 1   levothyroxine (SYNTHROID) 175 MCG tablet Take 1 tablet (175 mcg total) by mouth daily. 90 tablet 1   Multiple Vitamin (MULTIVITAMIN WITH MINERALS) TABS tablet Take 1 tablet by mouth daily.     Omega-3 Fatty Acids (FISH OIL) 1000 MG CAPS Take 2,000 mg by mouth daily.     PARoxetine (PAXIL) 10 MG tablet Take 1 tablet (10 mg total) by mouth daily. 90 tablet 1   QUEtiapine (SEROQUEL) 100 MG tablet Take 1 tablet (100 mg total) by mouth at bedtime. 90 tablet 1   No current facility-administered medications for this visit.     Musculoskeletal: Strength & Muscle Tone:  N/A Gait & Station:  N/A Patient leans: N/A  Psychiatric Specialty Exam: Review of Systems  There were no vitals taken for this visit.There is no height or weight on file to calculate BMI.  General Appearance: {Appearance:22683}  Eye Contact:  {BHH EYE CONTACT:22684}  Speech:  Clear and Coherent  Volume:  Normal  Mood:  {BHH MOOD:22306}  Affect:  {Affect (PAA):22687}  Thought Process:  Coherent  Orientation:  Full (Time, Place, and Person)  Thought Content: Logical   Suicidal Thoughts:  {ST/HT (PAA):22692}  Homicidal Thoughts:  {ST/HT (PAA):22692}  Memory:  Immediate;   Good  Judgement:  {Judgement (PAA):22694}  Insight:  {Insight (PAA):22695}  Psychomotor Activity:  Normal  Concentration:  Concentration: Good and Attention Span: Good  Recall:  Good  Fund of Knowledge: Good  Language: Good  Akathisia:  No  Handed:  Right  AIMS (if indicated): not done  Assets:  Communication Skills Desire for Improvement  ADL's:  Intact  Cognition: WNL  Sleep:  {BHH GOOD/FAIR/POOR:22877}   Screenings: PHQ2-9    Flowsheet Row Video Visit from 02/11/2021 in [place] Video Visit from 11/12/2020 in [place] Office Visit from 12/13/2019 in [place] at [facility] from 11/08/2018 in [facility] at [place]  PHQ-2 Total Score 0 0 0  0  PHQ-9 Total Score -- -- 6 --      Flowsheet Row Video Visit from 02/11/2021 in [place] Video Visit from 11/12/2020 in [place] No Risk No Risk        Assessment and Plan:  Arnetta Wubben is a 60 y.o. 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   1. Bipolar 1 disorder ([place]) 2.  Insomnia, unspecified type She denies any significant mood symptoms since the last visit.  Will continue lamotrigine for bipolar disorder.  Will continue Paxil for depression.  Will continue quetiapine for bipolar disorder and also to target insomnia. Noted that she has limited benefit/adverse reaction from hypnotics.  She politely requests to be seen every 5 months given changing her insurance/financial strain.  She agrees to contact the office if any worsening in her symptoms.    This clinician has discussed the side effect associated with medication prescribed during this encounter. Please refer to notes in the previous encounters for more details.        Plan:    1. Continue Paxil 10 mg at night (drowsiness from 20 mg) 2. Continue lamotrigine 50 mg daily  3. Continue quetiapine 100 mg at night (headache, fatigue at higher dose) 4. Next appointment:  10/17 at 9 AM for 30 mins, in person (she prefers q 5 months visits)   Past trials of medication: Sertraline, fluoxetine (drowsiness), Lexapro, Duloxetine, venlafaxine (initially worked well, stopped working, hair loss), Wellbutrin (insomnia),  Latuda, Abilify (flu like symptoms), Haldol (EPS), Thorazine (EPS), Stelazine,  Lithium, Lunesta (sedation), Ambien (sleepwalking, driving, eating), Trazodone (excessive sedation), Xanax   The patient demonstrates the following  risk factors for suicide:  Chronic risk factors for suicide include psychiatric disorder /bipolar disorder, history of substance use disorder, family history of suicide attempt. Acute risk factors for suicide include none. Protective factors for this patient include positive social support, responsibility to others (family), coping skills, hope for the future. Although patient reports passive SI, she adamantly denies any intent and has had no previous suicide attempt. Considering these factors, the overall suicide risk at this point appears to be low.          [name], MD 05/27/2021, 5:41 PM

## 2021-05-28 ENCOUNTER — Other Ambulatory Visit: Payer: Self-pay | Admitting: Internal Medicine

## 2021-05-28 ENCOUNTER — Encounter: Payer: Self-pay | Admitting: Internal Medicine

## 2021-05-28 ENCOUNTER — Ambulatory Visit (INDEPENDENT_AMBULATORY_CARE_PROVIDER_SITE_OTHER): Payer: 59 | Admitting: Internal Medicine

## 2021-05-28 VITALS — BP 110/80 | HR 72 | Temp 98.8°F | Wt 188.9 lb

## 2021-05-28 DIAGNOSIS — E559 Vitamin D deficiency, unspecified: Secondary | ICD-10-CM

## 2021-05-28 DIAGNOSIS — E039 Hypothyroidism, unspecified: Secondary | ICD-10-CM

## 2021-05-28 DIAGNOSIS — Z23 Encounter for immunization: Secondary | ICD-10-CM

## 2021-05-28 DIAGNOSIS — M549 Dorsalgia, unspecified: Secondary | ICD-10-CM

## 2021-05-28 LAB — TSH: TSH: 0.05 u[IU]/mL — ABNORMAL LOW (ref 0.35–5.50)

## 2021-05-28 LAB — VITAMIN D 25 HYDROXY (VIT D DEFICIENCY, FRACTURES): VITD: 37.34 ng/mL (ref 30.00–100.00)

## 2021-05-28 MED ORDER — LEVOTHYROXINE SODIUM 150 MCG PO TABS: 150.0000 ug | ORAL_TABLET | Freq: Every day | ORAL | 1 refills | Status: DC

## 2021-05-28 NOTE — Patient Instructions (Signed)
-  Nice seeing you today!!  -Lab work today; will notify you once results are available.  -For your back: daily stretches, icing, massage therapy.

## 2021-05-28 NOTE — Progress Notes (Signed)
Acute office Visit     This visit occurred during the SARS-CoV-2 public health emergency.  Safety protocols were in place, including screening questions prior to the visit, additional usage of staff PPE, and extensive cleaning of exam room while observing appropriate contact time as indicated for disinfecting solutions.    CC/Reason for Visit: Back pain  HPI: Miranda Harris is a 60 y.o. female who is coming in today for the above mentioned reasons.  She is in the process of moving and showing her house.  She has been having some left upper back pain right next to her scapula.  It is nonradiating, it "pinches" at times.  Requesting thyroid and vitamin D levels checked.  Past Medical/Surgical History: Past Medical History:  Diagnosis Date   Bipolar disorder ([place])    Blood transfusion without reported diagnosis    Chronic kidney disease    Depression    Elevated serum creatinine    related to lithium use   Helicobacter pylori (H. pylori) infection    pt denies   Hypothyroidism    Perirectal fistula    Thyroid disease    Trochanteric bursitis of right hip     Past Surgical History:  Procedure Laterality Date   COLONOSCOPY  01/06/2016   at [place] WITH HEMORRHOIDECTOMY N/A 06/28/2017   Procedure: EXAM UNDER ANESTHESIA WITH EXTERNAL HEMORRHOIDECTOMY AND REPAIR OF PERIRECTAL FISTULA;  Surgeon: Michael Boston, MD;  Location: [facility];  Service: General;  Laterality: N/A;   HERNIA REPAIR  [id number] yrs ago   umbilical hernia   INCISION AND DRAINAGE PERIRECTAL ABSCESS N/A 04/18/2017   Procedure: IRRIGATION AND DEBRIDEMENT PERIRECTAL ABSCESS, placement of seton, sigmoidoscopy;  Surgeon: Excell Seltzer, MD;  Location: [facility];  Service: General;  Laterality: N/A;   LAPAROSCOPIC APPENDECTOMY     POLYPECTOMY     VAGINAL HYSTERECTOMY Bilateral    complete   WISDOM TOOTH EXTRACTION      Social History:  reports that she has quit  smoking. Her smoking use included cigarettes. She has a 30.00 pack-year smoking history. She has never used smokeless tobacco. She reports current alcohol use. She reports that she does not use drugs.  Allergies: Allergies  Allergen Reactions   Metrogel [Metronidazole] Other (See Comments)    Burn face   Tetracyclines & Related Rash    Family History:  Family History  Problem Relation Age of Onset   Anxiety disorder Brother    Breast cancer Neg Hx    Colon cancer Neg Hx    Colon polyps Neg Hx    Esophageal cancer Neg Hx    Rectal cancer Neg Hx    Stomach cancer Neg Hx      Current Outpatient Medications:    Calcium Carbonate-Vitamin D (CALTRATE 600+D PO), Take 2 tablets by mouth daily., Disp: , Rfl:    lamoTRIgine 50 MG TBDP, Take 1 tablet (50 mg total) by mouth daily., Disp: 90 tablet, Rfl: 1   levothyroxine (SYNTHROID) 175 MCG tablet, Take 1 tablet (175 mcg total) by mouth daily., Disp: 90 tablet, Rfl: 1   Multiple Vitamin (MULTIVITAMIN WITH MINERALS) TABS tablet, Take 1 tablet by mouth daily., Disp: , Rfl:    Omega-3 Fatty Acids (FISH OIL) 1000 MG CAPS, Take 2,000 mg by mouth daily., Disp: , Rfl:    PARoxetine (PAXIL) 10 MG tablet, Take 1 tablet (10 mg total) by mouth daily., Disp: 90 tablet, Rfl: 1   QUEtiapine (SEROQUEL) 100  MG tablet, Take 1 tablet (100 mg total) by mouth at bedtime., Disp: 90 tablet, Rfl: 1  Review of Systems:  Constitutional: Denies fever, chills, diaphoresis, appetite change and fatigue.  HEENT: Denies photophobia, eye pain, redness, hearing loss, ear pain, congestion, sore throat, rhinorrhea, sneezing, mouth sores, trouble swallowing, neck pain, neck stiffness and tinnitus.   Respiratory: Denies SOB, DOE, cough, chest tightness,  and wheezing.   Cardiovascular: Denies chest pain, palpitations and leg swelling.  Gastrointestinal: Denies nausea, vomiting, abdominal pain, diarrhea, constipation, blood in stool and abdominal distention.  Genitourinary:  Denies dysuria, urgency, frequency, hematuria, flank pain and difficulty urinating.  Endocrine: Denies: hot or cold intolerance, sweats, changes in hair or nails, polyuria, polydipsia. Musculoskeletal: Denies  joint swelling, arthralgias and gait problem.  Skin: Denies pallor, rash and wound.  Neurological: Denies dizziness, seizures, syncope, weakness, light-headedness, numbness and headaches.  Hematological: Denies adenopathy. Easy bruising, personal or family bleeding history  Psychiatric/Behavioral: Denies suicidal ideation, mood changes, confusion, nervousness, sleep disturbance and agitation    Physical Exam: Vitals:   05/28/21 1038  BP: 110/80  Pulse: 72  Temp: 98.8 F (37.1 C)  TempSrc: Oral  SpO2: 99%  Weight: 188 lb 14.4 oz (85.7 kg)    Body mass index is 28.72 kg/m.   Constitutional: NAD, calm, comfortable Eyes: PERRL, lids and conjunctivae normal ENMT: Mucous membranes are moist.  Musculoskeletal: no clubbing / cyanosis. No joint deformity upper and lower extremities. Good ROM, no contractures. Normal muscle tone.  Psychiatric: Normal judgment and insight. Alert and oriented x 3. Normal mood.    Impression and Plan:  Upper back pain on left side -Advised icing, as needed NSAIDs, daily back stretches.  If no improvement she will contact me to consider muscle relaxers and physical therapy.  Need for influenza vaccination -Flu vaccine administered today.  Vitamin D deficiency  - Plan: VITAMIN D 25 Hydroxy (Vit-D Deficiency, Fractures)  Adult hypothyroidism  - Plan: TSH    Patient Instructions  -Nice seeing you today!!  -Lab work today; will notify you once results are available.  -For your back: daily stretches, icing, massage therapy.    Lelon Frohlich, MD [facility] at [facility]

## 2021-05-28 NOTE — Addendum Note (Signed)
Addended by: Amanda Cockayne on: 05/28/2021 11:14 AM   Modules accepted: Orders

## 2021-05-28 NOTE — Addendum Note (Signed)
Addended by: Westley Hummer B on: 05/28/2021 11:59 AM   Modules accepted: Orders

## 2021-05-28 NOTE — Addendum Note (Signed)
Addended by: Westley Hummer B on: 05/28/2021 05:09 PM   Modules accepted: Orders

## 2021-06-02 ENCOUNTER — Telehealth: Payer: Medicare Other | Admitting: Psychiatry

## 2021-06-03 MED ORDER — VITAMIN D (ERGOCALCIFEROL) 1.25 MG (50000 UNIT) PO CAPS: 50000.0000 [IU] | ORAL_CAPSULE | ORAL | 0 refills | Status: AC

## 2021-06-04 ENCOUNTER — Ambulatory Visit: Payer: 59 | Admitting: Internal Medicine

## 2021-06-09 ENCOUNTER — Other Ambulatory Visit: Payer: Self-pay

## 2021-06-10 ENCOUNTER — Ambulatory Visit (INDEPENDENT_AMBULATORY_CARE_PROVIDER_SITE_OTHER): Payer: 59 | Admitting: Internal Medicine

## 2021-06-10 ENCOUNTER — Encounter: Payer: Self-pay | Admitting: Internal Medicine

## 2021-06-10 VITALS — BP 102/76 | Temp 98.2°F | Wt 186.9 lb

## 2021-06-10 DIAGNOSIS — M542 Cervicalgia: Secondary | ICD-10-CM

## 2021-06-10 NOTE — Progress Notes (Signed)
Acute office Visit     This visit occurred during the SARS-CoV-2 public health emergency.  Safety protocols were in place, including screening questions prior to the visit, additional usage of staff PPE, and extensive cleaning of exam room while observing appropriate contact time as indicated for disinfecting solutions.    CC/Reason for Visit: Left-sided neck pain  HPI: Miranda Harris is a 60 y.o. female who is coming in today for the above mentioned reasons.  For the past month or so she has been having pain on the left side of her neck.  She is in the process of moving and has been doing a lot of heavy lifting.  She went to see a chiropractor who did several manipulations without improvement.  She was told by the chiropractor after an x-ray that she had neck arthritis.  Past Medical/Surgical History: Past Medical History:  Diagnosis Date   Bipolar disorder ([place])    Blood transfusion without reported diagnosis    Chronic kidney disease    Depression    Elevated serum creatinine    related to lithium use   Helicobacter pylori (H. pylori) infection    pt denies   Hypothyroidism    Perirectal fistula    Thyroid disease    Trochanteric bursitis of right hip     Past Surgical History:  Procedure Laterality Date   COLONOSCOPY  01/06/2016   at [place] WITH HEMORRHOIDECTOMY N/A 06/28/2017   Procedure: EXAM UNDER ANESTHESIA WITH EXTERNAL HEMORRHOIDECTOMY AND REPAIR OF PERIRECTAL FISTULA;  Surgeon: Michael Boston, MD;  Location: [facility];  Service: General;  Laterality: N/A;   HERNIA REPAIR  [id number] yrs ago   umbilical hernia   INCISION AND DRAINAGE PERIRECTAL ABSCESS N/A 04/18/2017   Procedure: IRRIGATION AND DEBRIDEMENT PERIRECTAL ABSCESS, placement of seton, sigmoidoscopy;  Surgeon: Excell Seltzer, MD;  [facility];  Service: General;  Laterality: N/A;   LAPAROSCOPIC APPENDECTOMY     POLYPECTOMY     VAGINAL HYSTERECTOMY  Bilateral    complete   WISDOM TOOTH EXTRACTION      Social History:  reports that she has quit smoking. Her smoking use included cigarettes. She has a 30.00 pack-year smoking history. She has never used smokeless tobacco. She reports current alcohol use. She reports that she does not use drugs.  Allergies: Allergies  Allergen Reactions   Metrogel [Metronidazole] Other (See Comments)    Burn face   Tetracyclines & Related Rash    Family History:  Family History  Problem Relation Age of Onset   Anxiety disorder Brother    Breast cancer Neg Hx    Colon cancer Neg Hx    Colon polyps Neg Hx    Esophageal cancer Neg Hx    Rectal cancer Neg Hx    Stomach cancer Neg Hx      Current Outpatient Medications:    Calcium Carbonate-Vitamin D (CALTRATE 600+D PO), Take 2 tablets by mouth daily., Disp: , Rfl:    lamoTRIgine 50 MG TBDP, Take 1 tablet (50 mg total) by mouth daily., Disp: 90 tablet, Rfl: 1   levothyroxine (SYNTHROID) 150 MCG tablet, Take 1 tablet (150 mcg total) by mouth daily., Disp: 90 tablet, Rfl: 1   Multiple Vitamin (MULTIVITAMIN WITH MINERALS) TABS tablet, Take 1 tablet by mouth daily., Disp: , Rfl:    Omega-3 Fatty Acids (FISH OIL) 1000 MG CAPS, Take 2,000 mg by mouth daily., Disp: , Rfl:    PARoxetine (PAXIL)  10 MG tablet, Take 1 tablet (10 mg total) by mouth daily., Disp: 90 tablet, Rfl: 1   QUEtiapine (SEROQUEL) 100 MG tablet, Take 1 tablet (100 mg total) by mouth at bedtime., Disp: 90 tablet, Rfl: 1   Vitamin D, Ergocalciferol, (DRISDOL) 1.25 MG (50000 UNIT) CAPS capsule, Take 1 capsule (50,000 Units total) by mouth every 7 (seven) days., Disp: 12 capsule, Rfl: 0  Review of Systems:  Constitutional: Denies fever, chills, diaphoresis, appetite change and fatigue.  HEENT: Denies photophobia, eye pain, redness, hearing loss, ear pain, congestion, sore throat, rhinorrhea, sneezing, mouth sores, trouble swallowing, neck pain, neck stiffness and tinnitus.   Respiratory:  Denies SOB, DOE, cough, chest tightness,  and wheezing.   Cardiovascular: Denies chest pain, palpitations and leg swelling.  Gastrointestinal: Denies nausea, vomiting, abdominal pain, diarrhea, constipation, blood in stool and abdominal distention.  Genitourinary: Denies dysuria, urgency, frequency, hematuria, flank pain and difficulty urinating.  Endocrine: Denies: hot or cold intolerance, sweats, changes in hair or nails, polyuria, polydipsia. Musculoskeletal: Denies  back pain, joint swelling, arthralgias and gait problem.  Skin: Denies pallor, rash and wound.  Neurological: Denies dizziness, seizures, syncope, weakness, light-headedness, numbness and headaches.  Hematological: Denies adenopathy. Easy bruising, personal or family bleeding history  Psychiatric/Behavioral: Denies suicidal ideation, mood changes, confusion, nervousness, sleep disturbance and agitation    Physical Exam: Vitals:   06/10/21 1302  BP: 102/76  Temp: 98.2 F (36.8 C)  TempSrc: Oral  Weight: 186 lb 14.4 oz (84.8 kg)    Body mass index is 28.42 kg/m.   Constitutional: NAD, calm, comfortable Eyes: PERRL, lids and conjunctivae normal ENMT: Mucous membranes are moist.  Musculoskeletal: She has pain to palpation along the course of the left trapezius muscle Neurologic: Grossly intact and nonfocal. Psychiatric: Normal judgment and insight. Alert and oriented x 3. Normal mood.    Impression and Plan:  Neck pain on left side -Suspect musculoskeletal, have advised icing, ibuprofen, weighted neck brace, as needed NSAIDs and local massage therapy.    Lelon Frohlich, MD [facility] at [facility]

## 2021-06-11 ENCOUNTER — Encounter: Payer: Self-pay | Admitting: Internal Medicine

## 2021-07-05 ENCOUNTER — Other Ambulatory Visit: Payer: Self-pay | Admitting: Internal Medicine

## 2021-07-05 DIAGNOSIS — Z1231 Encounter for screening mammogram for malignant neoplasm of breast: Secondary | ICD-10-CM

## 2021-07-07 DIAGNOSIS — E039 Hypothyroidism, unspecified: Secondary | ICD-10-CM | POA: Diagnosis not present

## 2021-07-07 DIAGNOSIS — M542 Cervicalgia: Secondary | ICD-10-CM | POA: Diagnosis not present

## 2021-07-07 DIAGNOSIS — G4709 Other insomnia: Secondary | ICD-10-CM | POA: Diagnosis not present

## 2021-07-07 DIAGNOSIS — F319 Bipolar disorder, unspecified: Secondary | ICD-10-CM | POA: Diagnosis not present

## 2021-07-07 DIAGNOSIS — E559 Vitamin D deficiency, unspecified: Secondary | ICD-10-CM | POA: Diagnosis not present

## 2021-07-07 NOTE — Progress Notes (Addendum)
BH MD/PA/NP OP Progress Note  07/12/2021 9:37 AM Miranda Harris  MRN:  [id number]  Chief Complaint:  Chief Complaint   Follow-up; Other    HPI:  This is a follow-up appointment for bipolar disorder and insomnia.  She states that she has not been doing well.  She moved in July.  She was very stressed as she could not sell her old house until last week.  There was some issues with neighbor, who is doing illegal things.  She had SI, although she did not have any intent, referring to her father who died by suicide.  She did not want to put the same things on her family.  However, there were times it was difficult for her to get out of the bed.  She woke up crying.  She discontinued Paxil with the thought that it is not working anymore.  She also states that it caused urinary retention for the past year.  She increased up quetiapine as she was not sleeping well.  She is sleeping better since this medication change.  She agrees to contact the office before making any self adjustment on her medication.  Although her mood has been better lately, she still feels that she does not care.  She feels fatigue.  She denies change in appetite.  She attributes her weight gain to not taking a walk anymore compared to before.  She has difficulty in concentration.  Although she has passive SI at times, it has been less intense, and she denies any intent or plans.  She denies decreased need for sleep or euphonia.  She would like to try up titrating the dose of lamotrigine.  She is hoping to work on regular exercise.  She is also planning to start a part-time job as she does not do well staying in the house.    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 Bipolar 1 disorder (HCC)  F31.9     2. Insomnia, unspecified type  G47.00       Past Psychiatric History: Please see initial evaluation for full details. I have reviewed the history. No updates at this time.     Past Medical History:  Past Medical History:  Diagnosis Date   Bipolar disorder ([place])    Blood transfusion without reported diagnosis    Chronic kidney disease    Depression    Elevated serum creatinine    related to lithium use   Helicobacter pylori (H. pylori) infection    pt denies   Hypothyroidism    Perirectal fistula    Thyroid disease    Trochanteric bursitis of right hip     Past Surgical History:  Procedure Laterality Date   COLONOSCOPY  01/06/2016   at [place] WITH HEMORRHOIDECTOMY N/A 06/28/2017   Procedure: EXAM UNDER ANESTHESIA WITH EXTERNAL HEMORRHOIDECTOMY AND REPAIR OF PERIRECTAL FISTULA;  Surgeon: Michael Boston, MD;  Location: [facility];  Service: General;  Laterality: N/A;   HERNIA REPAIR  1-69 yrs ago   umbilical hernia   INCISION AND DRAINAGE PERIRECTAL ABSCESS N/A 04/18/2017   Procedure: IRRIGATION AND  DEBRIDEMENT PERIRECTAL ABSCESS, placement of seton, sigmoidoscopy;  Surgeon: Excell Seltzer, MD;  [facility];  Service: General;  Laterality: N/A;   LAPAROSCOPIC APPENDECTOMY     POLYPECTOMY     VAGINAL HYSTERECTOMY Bilateral    complete   WISDOM TOOTH EXTRACTION      Family Psychiatric History: Please see initial evaluation for full details. I have reviewed the history. No updates at this time.     Family History:  Family History  Problem Relation Age of Onset   Anxiety disorder Brother    Breast cancer Neg Hx    Colon cancer Neg Hx    Colon polyps Neg Hx    Esophageal cancer Neg Hx    Rectal cancer Neg Hx    Stomach cancer Neg Hx     Social History:  Social History   Socioeconomic History   Marital status: Married     Spouse name: Not on file   Number of children: Not on file   Years of education: Not on file   Highest education level: Not on file  Occupational History   Not on file  Tobacco Use   Smoking status: Former    Packs/day: 1.00    Years: 30.00    Pack years: 30.00    Types: Cigarettes   Smokeless tobacco: Never   Tobacco comments:    quit 2008  Vaping Use   Vaping Use: Never used  Substance and Sexual Activity   Alcohol use: Yes    Comment: once a month per pt   Drug use: No   Sexual activity: Yes    Partners: Male    Birth control/protection: Surgical  Other Topics Concern   Not on file  Social History Narrative   Not on file   Social Determinants of Health   Financial Resource Strain: Not on file  Food Insecurity: Not on file  Transportation Needs: Not on file  Physical Activity: Not on file  Stress: Not on file  Social Connections: Not on file    Allergies:  Allergies  Allergen Reactions   Metrogel [Metronidazole] Other (See Comments)    Burn face   Tetracyclines & Related Rash    Metabolic Disorder Labs: Lab Results  Component Value Date   HGBA1C 5.5 09/04/2020   MPG 108 06/10/2015   No results found for: PROLACTIN Lab Results  Component Value Date   CHOL 205 (H) 12/25/2020   TRIG 76.0 12/25/2020   HDL 73.30 12/25/2020   CHOLHDL 3 12/25/2020   VLDL 15.2 12/25/2020   LDLCALC 116 (H) 12/25/2020   LDLCALC 122 (H) 09/04/2020   Lab Results  Component Value Date   TSH 0.05 (L) 05/28/2021   TSH 1.32 03/08/2021    Therapeutic Level Labs: Lab Results  Component Value Date   LITHIUM 1.2 11/08/2018   LITHIUM 0.80 06/10/2015   No results found for: VALPROATE No components found for:  CBMZ  Current Medications: Current Outpatient Medications  Medication Sig Dispense Refill   Calcium Carbonate-Vitamin D (CALTRATE 600+D PO) Take 2 tablets by mouth daily.     lamoTRIgine (LAMICTAL) 100 MG tablet Take 1 tablet (100 mg total) by mouth daily. 30 tablet  0   lamoTRIgine 50 MG TBDP Take 1 tablet (50 mg total) by mouth daily. 90 tablet 1   levothyroxine (SYNTHROID) 150 MCG tablet Take 1 tablet (150 mcg total) by mouth daily. 90 tablet 1   Multiple Vitamin (MULTIVITAMIN WITH MINERALS) TABS tablet Take 1 tablet by mouth daily.  Omega-3 Fatty Acids (FISH OIL) 1000 MG CAPS Take 2,000 mg by mouth daily.     Vitamin D, Ergocalciferol, (DRISDOL) 1.25 MG (50000 UNIT) CAPS capsule Take 1 capsule (50,000 Units total) by mouth every 7 (seven) days. 12 capsule 0   QUEtiapine 150 MG TABS Take 150 mg by mouth at bedtime. 90 tablet 0   No current facility-administered medications for this visit.     Musculoskeletal: Strength & Muscle Tone:  N/A Gait & Station:  N/A Patient leans: N/A  Psychiatric Specialty Exam: Review of Systems  Psychiatric/Behavioral:  Positive for decreased concentration, dysphoric mood and suicidal ideas. Negative for agitation, behavioral problems, confusion, hallucinations, self-injury and sleep disturbance. The patient is nervous/anxious. The patient is not hyperactive.   All other systems reviewed and are negative.  Blood pressure 132/90, pulse 83, temperature 97.9 F (36.6 C), temperature source Temporal, weight 192 lb 12.8 oz (87.5 kg).Body mass index is 29.32 kg/m.  General Appearance: Fairly Groomed  Eye Contact:  Good  Speech:  Clear and Coherent  Volume:  Normal  Mood:  Depressed  Affect:  Appropriate, Congruent, and Tearful  Thought Process:  Coherent  Orientation:  Full (Time, Place, and Person)  Thought Content: Logical   Suicidal Thoughts:  Yes.  without intent/plan  Homicidal Thoughts:  No  Memory:  Immediate;   Good  Judgement:  Good  Insight:  Good  Psychomotor Activity:  Normal  Concentration:  Concentration: Good and Attention Span: Good  Recall:  Good  Fund of Knowledge: Good  Language: Good  Akathisia:  No  Handed:  Right  AIMS (if indicated): 0   Assets:  Communication Skills Desire for  Improvement  ADL's:  Intact  Cognition: WNL  Sleep:  Good   Screenings: PHQ2-9    [place] Office Visit from 07/12/2021 in [place] Video Visit from 02/11/2021 in [place] Video Visit from 11/12/2020 in [place] Office Visit from 12/13/2019 in [place] [facility] [place] from 11/08/2018 in [facility] [place]  PHQ-2 Total Score 4 0 0 0 0  PHQ-9 Total Score 11 -- -- 6 --      [place] Office Visit from 07/12/2021 in [place] Video Visit from 02/11/2021 in [place] Video Visit from 11/12/2020 in [place] High Risk No Risk No Risk        Assessment and Plan:  Miranda Harris is a 60 y.o. 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   1. Bipolar 1 disorder ([place]) 2. Insomnia, unspecified type There has been significant worsening in depressive symptoms in the context of relocation/selling her old house.  Will uptitrate lamotrigine for bipolar depression.  Discussed potential risk of Stevens-Johnson syndrome.  Will continue quetiapine for bipolar depression and also to target insomnia.  Discussed potential metabolic side effect and EPS.  Noted that although she did have weight gain, she attributes this to immobility since the location.  She is willing to work on daily exercise.  Noted that she self discontinued Paxil due to concern of urinary retention.  Will hold this medication at this time.   This clinician has discussed the side effect associated with medication prescribed during this encounter. Please refer to notes in the previous encounters for more details.  Plan:   Increase lamotrigine 100 mg daily Continue quetiapine 150 mg at night (she self uptitrated, previously had headache, fatigue at higher dose) Hold Paxil (used to be on 10 mg, she self discontinued) Next appointment: 11/15 at 4 PM, video Emergency resources which includes 911, ED, suicide crisis line [phone]) are discussed.    Past trials of medication: Sertraline, fluoxetine (drowsiness), Lexapro, Duloxetine, venlafaxine (initially worked well, stopped working, hair loss), Wellbutrin (insomnia),  Paxil (urinary retention), Latuda, Abilify (flu like symptoms), Haldol (EPS), Thorazine (EPS), Stelazine,  Lithium, Lunesta (sedation), Ambien (sleepwalking, driving, eating), Trazodone (excessive sedation), Xanax   I have reviewed suicide assessment in detail. No change in the following assessment.    The patient demonstrates the following  risk factors for suicide: Chronic risk factors for suicide include psychiatric disorder /bipolar disorder, history of substance use disorder, family history of suicide attempt. Acute risk factors for suicide include none. Protective factors for this patient include positive social support, responsibility to others (family), coping skills, hope for the future. Although patient reports passive SI, she adamantly denies any intent and has had no previous suicide attempt. Considering these factors, the overall suicide risk at this point appears to be low.     [name], MD 07/12/2021, 9:37 AM

## 2021-07-12 ENCOUNTER — Other Ambulatory Visit: Payer: Self-pay

## 2021-07-12 ENCOUNTER — Ambulatory Visit (INDEPENDENT_AMBULATORY_CARE_PROVIDER_SITE_OTHER): Payer: 59 | Admitting: Psychiatry

## 2021-07-12 ENCOUNTER — Encounter: Payer: Self-pay | Admitting: Psychiatry

## 2021-07-12 VITALS — BP 132/90 | HR 83 | Temp 97.9°F | Wt 192.8 lb

## 2021-07-12 DIAGNOSIS — F319 Bipolar disorder, unspecified: Secondary | ICD-10-CM

## 2021-07-12 DIAGNOSIS — G4700 Insomnia, unspecified: Secondary | ICD-10-CM | POA: Diagnosis not present

## 2021-07-12 MED ORDER — LAMOTRIGINE 100 MG PO TABS: 100.0000 mg | ORAL_TABLET | Freq: Every day | ORAL | 0 refills | Status: DC

## 2021-07-12 MED ORDER — QUETIAPINE FUMARATE 150 MG PO TABS: 150.0000 mg | ORAL_TABLET | Freq: Every day | ORAL | 0 refills | Status: DC

## 2021-07-12 NOTE — Patient Instructions (Signed)
Increase lamotrigine 100 mg daily Continue quetiapine 150 mg at night  Hold Paxil  Next appointment: 11/15 at 4 PM

## 2021-07-13 ENCOUNTER — Encounter: Payer: Self-pay | Admitting: Internal Medicine

## 2021-07-20 NOTE — Progress Notes (Signed)
Virtual Visit via Video Note  I connected with Miranda Harris on 07/21/21 at  2:30 PM EDT by a video enabled telemedicine application and verified that I am speaking with the correct person using two identifiers.  Location: Patient: home Provider: office Persons participated in the visit- patient, provider    I discussed the limitations of evaluation and management by telemedicine and the availability of in person appointments. The patient expressed understanding and agreed to proceed.   I discussed the assessment and treatment plan with the patient. The patient was provided an opportunity to ask questions and all were answered. The patient agreed with the plan and demonstrated an understanding of the instructions.   The patient was advised to call back or seek an in-person evaluation if the symptoms worsen or if the condition fails to improve as anticipated.  I provided 15 minutes of non-face-to-face time during this encounter.   Miranda Clay, MD    [facility] MD/PA/NP OP Progress Note  07/21/2021 2:58 PM Miranda Harris  MRN:  [id number]  Chief Complaint:  Chief Complaint   Follow-up; Other    HPI:  This is a follow-up appointment for bipolar disorder.  This appointment was made sooner than the original appointment due to the request from the patient.  She states that she has been feeling dizzy since last week.  It continued for a few days, and has been resolving since she discontinued lamotrigine.  She has been feeling very depressed.  She has hard time getting out of the bed.  She states that she is always close to tears.  Although she reports passive SI, she adamantly denies any plan or intent, referring to her family history.  She states that she was on Prozac before, which stopped working in the past.  It worked some when it was restarted after a few years.  She would like to try this instead of Paxil as she is concerned about urinary retention.  After provided  psychoeducation, she is willing to try lamotrigine from lower dose, and consider starting Prozac at the next visit.  She denies any hallucinations, euphonia, or decreased need for sleep since she was started on lamotrigine in the past.  She is not interested in therapy as she does not think there are things to discuss.  She verbalized understanding about skills she can learn through therapy.   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 Bipolar 1 disorder (HCC)  F31.9     2. Insomnia, unspecified type  G47.00       Past Psychiatric History: Please see initial evaluation for full details. I have reviewed the history. No updates at this time.     Past Medical History:  Past Medical History:  Diagnosis Date   Bipolar disorder ([place])    Blood transfusion without reported diagnosis    Chronic kidney disease    Depression    Elevated serum creatinine    related to lithium use   Helicobacter pylori (H. pylori) infection    pt denies   Hypothyroidism    Perirectal fistula    Thyroid disease    Trochanteric bursitis of right hip     Past Surgical History:  Procedure Laterality Date   COLONOSCOPY  01/06/2016   at [place] WITH HEMORRHOIDECTOMY N/A 06/28/2017   Procedure:  EXAM UNDER ANESTHESIA WITH EXTERNAL HEMORRHOIDECTOMY AND REPAIR OF PERIRECTAL FISTULA;  Surgeon: Michael Boston, MD;  Location: [place];  Service: General;  Laterality: N/A;   HERNIA REPAIR  4-26 yrs ago   umbilical hernia   INCISION AND DRAINAGE PERIRECTAL ABSCESS N/A 04/18/2017   Procedure: IRRIGATION AND DEBRIDEMENT PERIRECTAL ABSCESS, placement of seton, sigmoidoscopy;  Surgeon: Excell Seltzer, MD;  Location: [facility];  Service: General;  Laterality: N/A;   LAPAROSCOPIC APPENDECTOMY     POLYPECTOMY     VAGINAL  HYSTERECTOMY Bilateral    complete   WISDOM TOOTH EXTRACTION      Family Psychiatric History: Please see initial evaluation for full details. I have reviewed the history. No updates at this time.     Family History:  Family History  Problem Relation Age of Onset   Anxiety disorder Brother    Breast cancer Neg Hx    Colon cancer Neg Hx    Colon polyps Neg Hx    Esophageal cancer Neg Hx    Rectal cancer Neg Hx    Stomach cancer Neg Hx     Social History:  Social History   Socioeconomic History   Marital status: Married    Spouse name: Not on file   Number of children: Not on file   Years of education: Not on file   Highest education level: Not on file  Occupational History   Not on file  Tobacco Use   Smoking status: Former    Packs/day: 1.00    Years: 30.00    Pack years: 30.00    Types: Cigarettes   Smokeless tobacco: Never   Tobacco comments:    quit 2008  Vaping Use   Vaping Use: Never used  Substance and Sexual Activity   Alcohol use: Yes    Comment: once a month per pt   Drug use: No   Sexual activity: Yes    Partners: Male    Birth control/protection: Surgical  Other Topics Concern   Not on file  Social History Narrative   Not on file   Social Determinants of Health   Financial Resource Strain: Not on file  Food Insecurity: Not on file  Transportation Needs: Not on file  Physical Activity: Not on file  Stress: Not on file  Social Connections: Not on file    Allergies:  Allergies  Allergen Reactions   Metrogel [Metronidazole] Other (See Comments)    Burn face   Tetracyclines & Related Rash    Metabolic Disorder Labs: Lab Results  Component Value Date   HGBA1C 5.5 09/04/2020   MPG 108 06/10/2015   No results found for: PROLACTIN Lab Results  Component Value Date   CHOL 205 (H) 12/25/2020   TRIG 76.0 12/25/2020   HDL 73.30 12/25/2020   CHOLHDL 3 12/25/2020   VLDL 15.2 12/25/2020   LDLCALC 116 (H) 12/25/2020   LDLCALC 122 (H)  09/04/2020   Lab Results  Component Value Date   TSH 0.05 (L) 05/28/2021   TSH 1.32 03/08/2021    Therapeutic Level Labs: Lab Results  Component Value Date   LITHIUM 1.2 11/08/2018   LITHIUM 0.80 06/10/2015   No results found for: VALPROATE No components found for:  CBMZ  Current Medications: Current Outpatient Medications  Medication Sig Dispense Refill   lamoTRIgine (LAMICTAL) 25 MG tablet Take 1 tablet (25 mg total) by mouth daily for 14 days, THEN 2 tablets (50 mg total) daily for 14 days. 42 tablet 0  Calcium Carbonate-Vitamin D (CALTRATE 600+D PO) Take 2 tablets by mouth daily.     levothyroxine (SYNTHROID) 150 MCG tablet Take 1 tablet (150 mcg total) by mouth daily. 90 tablet 1   Multiple Vitamin (MULTIVITAMIN WITH MINERALS) TABS tablet Take 1 tablet by mouth daily.     Omega-3 Fatty Acids (FISH OIL) 1000 MG CAPS Take 2,000 mg by mouth daily.     QUEtiapine 150 MG TABS Take 150 mg by mouth at bedtime. 90 tablet 0   Vitamin D, Ergocalciferol, (DRISDOL) 1.25 MG (50000 UNIT) CAPS capsule Take 1 capsule (50,000 Units total) by mouth every 7 (seven) days. 12 capsule 0   No current facility-administered medications for this visit.     Musculoskeletal: Strength & Muscle Tone:  N/A Gait & Station:  N/A Patient leans: N/A  Psychiatric Specialty Exam: Review of Systems  Psychiatric/Behavioral:  Positive for decreased concentration, dysphoric mood and suicidal ideas. Negative for agitation, behavioral problems, confusion, hallucinations, self-injury and sleep disturbance. The patient is nervous/anxious. The patient is not hyperactive.   All other systems reviewed and are negative.  There were no vitals taken for this visit.There is no height or weight on file to calculate BMI.  General Appearance: Fairly Groomed  Eye Contact:  Good  Speech:  Clear and Coherent  Volume:  Normal  Mood:  Depressed  Affect:  Appropriate, Congruent, and Tearful  Thought Process:  Coherent   Orientation:  Full (Time, Place, and Person)  Thought Content: Logical   Suicidal Thoughts:  Yes.  without intent/plan  Homicidal Thoughts:  No  Memory:  Immediate;   Good  Judgement:  Good  Insight:  Good  Psychomotor Activity:  Normal  Concentration:  Concentration: Good and Attention Span: Good  Recall:  Good  Fund of Knowledge: Good  Language: Good  Akathisia:  No  Handed:  Right  AIMS (if indicated): not done  Assets:  Communication Skills Desire for Improvement  ADL's:  Intact  Cognition: WNL  Sleep:  Fair   Screenings: PHQ2-9    [place] Office Visit from 07/12/2021 in [place] Video Visit from 02/11/2021 in [place] Video Visit from 11/12/2020 in [place] from 12/13/2019 in [place] [facility] [place] from 11/08/2018 in [place] at [place]  PHQ-2 Total Score 4 0 0 0 0  PHQ-9 Total Score 11 -- -- 6 --      Flowsheet Row Video Visit from 07/21/2021 in [place] Office Visit from 07/12/2021 in [place] Video Visit from 02/11/2021 in [place] Error: Q3, 4, or 5 should not be populated when Q2 is No High Risk No Risk        Assessment and Plan:  Osceola Holian is a 60 y.o. 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    1. Bipolar 1 disorder ([place]) 2. Insomnia, unspecified type She reports dizziness after a titration of lamotrigine, which has resolved since she discontinued the medication a few days ago.  She continues to experience depressive symptoms with passive SI.  Recent psychosocial stressors includes relocation/selling her old house.   We will restart lamotrigine from lower dose for bipolar disorder/depression.  Will continue quetiapine for bipolar depression and insomnia.  Noted that she is interested in trying  fluoxetine again, which worked in the past.  She is not amenable to retry Paxil given concern of urinary retention.  She agrees to first try lamotrigine to avoid medication induced mania.  Although she will greatly benefit from CBT, she is not interested in this.  Also noted that although referral for ECT was discussed given her adverse reaction from other psychotropics/worsening in her mood symptoms, she is not interested in this treatment at this time.   This clinician has discussed the side effect associated with medication prescribed during this encounter. Please refer to notes in the previous encounters for more details.      Plan:   Start lamotrigine 25 mg daily for 2 weeks , then 50 mg daily  (dizziness from 100 mg ) Continue quetiapine 150 mg at night (she self uptitrated, previously had headache, fatigue at higher dose) Next appointment: 11/22 at 8 Am, video  Emergency resources which includes 911, ED, suicide crisis line [phone]) are discussed.     Past trials of medication: Sertraline, fluoxetine (drowsiness), Lexapro, Duloxetine, venlafaxine (initially worked well, stopped working, hair loss), Wellbutrin (insomnia),  Paxil (urinary retention), Latuda, Abilify (flu like symptoms), Haldol (EPS), Thorazine (EPS), Stelazine,  Lithium, Lunesta (sedation), Ambien (sleepwalking, driving, eating), Trazodone (excessive sedation), Xanax   I have reviewed suicide assessment in detail. No change in the following assessment.    The patient demonstrates the following  risk factors for suicide: Chronic risk factors for suicide include psychiatric disorder /bipolar disorder, history of substance use disorder, family history of suicide attempt. Acute risk factors for suicide include none. Protective factors for this  patient include positive social support, responsibility to others (family), coping skills, hope for the future. Although patient reports passive SI, she adamantly denies any intent and has had no previous suicide attempt. Considering these factors, the overall suicide risk at this point appears to be low.     Miranda Clay, MD 07/21/2021, 2:58 PM

## 2021-07-21 ENCOUNTER — Encounter: Payer: Self-pay | Admitting: Psychiatry

## 2021-07-21 ENCOUNTER — Telehealth (INDEPENDENT_AMBULATORY_CARE_PROVIDER_SITE_OTHER): Payer: 59 | Admitting: Psychiatry

## 2021-07-21 ENCOUNTER — Other Ambulatory Visit: Payer: Self-pay

## 2021-07-21 DIAGNOSIS — G4700 Insomnia, unspecified: Secondary | ICD-10-CM

## 2021-07-21 DIAGNOSIS — F319 Bipolar disorder, unspecified: Secondary | ICD-10-CM

## 2021-07-21 MED ORDER — LAMOTRIGINE 25 MG PO TABS: ORAL_TABLET | ORAL | 0 refills | Status: DC

## 2021-07-21 NOTE — Patient Instructions (Signed)
Start lamotrigine 25 mg daily for 2 weeks , then 50 mg daily   Continue quetiapine 150 mg at night  Next appointment: 11/22 at 8 AM

## 2021-07-27 DIAGNOSIS — R8281 Pyuria: Secondary | ICD-10-CM | POA: Diagnosis not present

## 2021-07-27 DIAGNOSIS — G5600 Carpal tunnel syndrome, unspecified upper limb: Secondary | ICD-10-CM | POA: Diagnosis not present

## 2021-07-27 DIAGNOSIS — N1419 Nephropathy induced by other drugs, medicaments and biological substances: Secondary | ICD-10-CM | POA: Diagnosis not present

## 2021-07-27 DIAGNOSIS — F319 Bipolar disorder, unspecified: Secondary | ICD-10-CM | POA: Diagnosis not present

## 2021-07-27 DIAGNOSIS — T43595S Adverse effect of other antipsychotics and neuroleptics, sequela: Secondary | ICD-10-CM | POA: Diagnosis not present

## 2021-07-27 DIAGNOSIS — T56891A Toxic effect of other metals, accidental (unintentional), initial encounter: Secondary | ICD-10-CM | POA: Diagnosis not present

## 2021-07-27 DIAGNOSIS — N183 Chronic kidney disease, stage 3 unspecified: Secondary | ICD-10-CM | POA: Diagnosis not present

## 2021-07-30 ENCOUNTER — Ambulatory Visit: Payer: 59

## 2021-08-03 ENCOUNTER — Ambulatory Visit
Admission: RE | Admit: 2021-08-03 | Discharge: 2021-08-03 | Disposition: A | Discharge status: Home / Self Care | Payer: 59 | Source: Ambulatory Visit | Attending: Internal Medicine | Admitting: Internal Medicine

## 2021-08-03 ENCOUNTER — Other Ambulatory Visit: Payer: Self-pay

## 2021-08-03 DIAGNOSIS — Z1231 Encounter for screening mammogram for malignant neoplasm of breast: Secondary | ICD-10-CM

## 2021-08-05 ENCOUNTER — Telehealth: Payer: Self-pay

## 2021-08-05 NOTE — Telephone Encounter (Signed)
Patient is no longer on lamotrigine 100 mg daily.  Her dose is being titrated up, she is not due for refill yet.

## 2021-08-05 NOTE — Telephone Encounter (Signed)
received fax requesting a lamotrigine 100mg  take 1 tablet by mouth every day

## 2021-08-06 NOTE — Telephone Encounter (Signed)
Pharmacy was already notified.

## 2021-08-10 ENCOUNTER — Telehealth: Payer: 59 | Admitting: Psychiatry

## 2021-08-13 ENCOUNTER — Telehealth: Payer: Medicare Other | Admitting: Psychiatry

## 2021-08-13 DIAGNOSIS — E559 Vitamin D deficiency, unspecified: Secondary | ICD-10-CM | POA: Diagnosis not present

## 2021-08-15 ENCOUNTER — Other Ambulatory Visit: Payer: Self-pay | Admitting: Psychiatry

## 2021-08-16 NOTE — Progress Notes (Signed)
Virtual Visit via Video Note  I connected with Miranda Harris on 08/18/21 at 10:00 AM EST by a video enabled telemedicine application and verified that I am speaking with the correct person using two identifiers.  Location: Patient: home Provider: office Persons participated in the visit- patient, provider    I discussed the limitations of evaluation and management by telemedicine and the availability of in person appointments. The patient expressed understanding and agreed to proceed.    I discussed the assessment and treatment plan with the patient. The patient was provided an opportunity to ask questions and all were answered. The patient agreed with the plan and demonstrated an understanding of the instructions.   The patient was advised to call back or seek an in-person evaluation if the symptoms worsen or if the condition fails to improve as anticipated.  I provided 17 minutes of non-face-to-face time during this encounter.   [name], MD    [facility] MD/PA/NP OP Progress Note  08/18/2021 10:37 AM Miranda Harris  MRN:  [id number]  Chief Complaint:  Chief Complaint   Follow-up; Other    HPI:  This is a follow-up appointment for bipolar disorder. She states that she continues to feel depressed.  She has hard time motivating herself, and stays in the bed until noon.  Although she should be happier, living in the new place, she feels sad and depressed.  She feels worthless.  Although she has passive SI, she adamantly denies any intent or plan to do things, referring to the loss of her father, who died by suicide.  It was devastating experience for the family.  She thinks about her family, and she cannot do it.  She thinks she will be feeling good if she is able to start antidepressant.  She recalls that fluoxetine worked well in the past, and would like to try this.  She feels scared to try other medication as she is concerned about possible side effect.  She has  hypersomnia.  She feels fatigue.  She denies change in appetite.  She has been taking lamotrigine without side effect this time. No known manic episode since the last visit.    Visit Diagnosis:    ICD-10-CM   1. Bipolar 1 disorder (HCC)  F31.9     2. Insomnia, unspecified type  G47.00       Past Psychiatric History: Please see initial evaluation for full details. I have reviewed the history. No updates at this time.     Past Medical History:  Past Medical History:  Diagnosis Date   Bipolar disorder ([place])    Blood transfusion without reported diagnosis    Chronic kidney disease    Depression    Elevated serum creatinine    related to lithium use   Helicobacter pylori (H. pylori) infection    pt denies   Hypothyroidism    Perirectal fistula    Thyroid disease    Trochanteric bursitis of right hip     Past Surgical History:  Procedure Laterality Date   COLONOSCOPY  01/06/2016   at [place] WITH HEMORRHOIDECTOMY N/A 06/28/2017   Procedure: EXAM UNDER ANESTHESIA WITH EXTERNAL HEMORRHOIDECTOMY AND REPAIR OF PERIRECTAL FISTULA;  Surgeon: Michael Boston, MD;  Location: [facility];  Service: General;  Laterality: N/A;   HERNIA REPAIR  8-34 yrs ago   umbilical hernia   INCISION AND DRAINAGE PERIRECTAL ABSCESS N/A 04/18/2017   Procedure: IRRIGATION AND DEBRIDEMENT PERIRECTAL ABSCESS, placement of seton,  sigmoidoscopy;  Surgeon: Excell Seltzer, MD;  Location[facility];  Service: General;  Laterality: N/A;   LAPAROSCOPIC APPENDECTOMY     POLYPECTOMY     VAGINAL HYSTERECTOMY Bilateral    complete   WISDOM TOOTH EXTRACTION      Family Psychiatric History: Please see initial evaluation for full details. I have reviewed the history. No updates at this time.     Family History:  Family History  Problem Relation Age of Onset   Anxiety disorder Brother    Breast cancer Neg Hx    Colon cancer Neg Hx    Colon polyps Neg Hx    Esophageal  cancer Neg Hx    Rectal cancer Neg Hx    Stomach cancer Neg Hx     Social History:  Social History   Socioeconomic History   Marital status: Married    Spouse name: Not on file   Number of children: Not on file   Years of education: Not on file   Highest education level: Not on file  Occupational History   Not on file  Tobacco Use   Smoking status: Former    Packs/day: 1.00    Years: 30.00    Pack years: 30.00    Types: Cigarettes   Smokeless tobacco: Never   Tobacco comments:    quit 2008  Vaping Use   Vaping Use: Never used  Substance and Sexual Activity   Alcohol use: Yes    Comment: once a month per pt   Drug use: No   Sexual activity: Yes    Partners: Male    Birth control/protection: Surgical  Other Topics Concern   Not on file  Social History Narrative   Not on file   Social Determinants of Health   Financial Resource Strain: Not on file  Food Insecurity: Not on file  Transportation Needs: Not on file  Physical Activity: Not on file  Stress: Not on file  Social Connections: Not on file    Allergies:  Allergies  Allergen Reactions   Metrogel [Metronidazole] Other (See Comments)    Burn face   Tetracyclines & Related Rash    Metabolic Disorder Labs: Lab Results  Component Value Date   HGBA1C 5.5 09/04/2020   MPG 108 06/10/2015   No results found for: PROLACTIN Lab Results  Component Value Date   CHOL 205 (H) 12/25/2020   TRIG 76.0 12/25/2020   HDL 73.30 12/25/2020   CHOLHDL 3 12/25/2020   VLDL 15.2 12/25/2020   LDLCALC 116 (H) 12/25/2020   LDLCALC 122 (H) 09/04/2020   Lab Results  Component Value Date   TSH 0.05 (L) 05/28/2021   TSH 1.32 03/08/2021    Therapeutic Level Labs: Lab Results  Component Value Date   LITHIUM 1.2 11/08/2018   LITHIUM 0.80 06/10/2015   No results found for: VALPROATE No components found for:  CBMZ  Current Medications: Current Outpatient Medications  Medication Sig Dispense Refill   FLUoxetine  (PROZAC) 10 MG capsule Take 1 capsule (10 mg total) by mouth daily. 30 capsule 0   Calcium Carbonate-Vitamin D (CALTRATE 600+D PO) Take 2 tablets by mouth daily.     lamoTRIgine (LAMICTAL) 25 MG tablet Take 2 tablets (50 mg total) by mouth daily. 60 tablet 0   levothyroxine (SYNTHROID) 150 MCG tablet Take 1 tablet (150 mcg total) by mouth daily. 90 tablet 1   levothyroxine (SYNTHROID) 175 MCG tablet TAKE 1 TABLET BY MOUTH EVERY DAY 90 tablet 0   Multiple Vitamin (  MULTIVITAMIN WITH MINERALS) TABS tablet Take 1 tablet by mouth daily.     Omega-3 Fatty Acids (FISH OIL) 1000 MG CAPS Take 2,000 mg by mouth daily.     QUEtiapine 150 MG TABS Take 150 mg by mouth at bedtime. 90 tablet 0   Vitamin D, Ergocalciferol, (DRISDOL) 1.25 MG (50000 UNIT) CAPS capsule Take 1 capsule (50,000 Units total) by mouth every 7 (seven) days. 12 capsule 0   No current facility-administered medications for this visit.     Musculoskeletal: Strength & Muscle Tone:  N/A Gait & Station:  N/A Patient leans: N/A  Psychiatric Specialty Exam: Review of Systems  Psychiatric/Behavioral:  Positive for decreased concentration, dysphoric mood, sleep disturbance and suicidal ideas. Negative for agitation, behavioral problems, confusion, hallucinations and self-injury. The patient is nervous/anxious. The patient is not hyperactive.   All other systems reviewed and are negative.  There were no vitals taken for this visit.There is no height or weight on file to calculate BMI.  General Appearance: Fairly Groomed  Eye Contact:  Good  Speech:  Clear and Coherent  Volume:  Normal  Mood:  Depressed  Affect:  Appropriate, Congruent, Restricted, and down  Thought Process:  Coherent  Orientation:  Full (Time, Place, and Person)  Thought Content: Logical   Suicidal Thoughts:  Yes.  without intent/plan  Homicidal Thoughts:  No  Memory:  Immediate;   Good  Judgement:  Good  Insight:  Good  Psychomotor Activity:  Normal   Concentration:  Concentration: Good and Attention Span: Good  Recall:  Good  Fund of Knowledge: Good  Language: Good  Akathisia:  No  Handed:  Right  AIMS (if indicated): not done  Assets:  Communication Skills Desire for Improvement  ADL's:  Intact  Cognition: WNL  Sleep:   hypersomnia   Screenings: [facility] Office Visit from 07/12/2021 in [place] Video Visit from 02/11/2021 in [place] Video Visit from 11/12/2020 in [place] from 12/13/2019 in [place] at [place] from 11/08/2018 in [place] at [place]  PHQ-2 Total Score 4 0 0 0 0  PHQ-9 Total Score 11 -- -- 6 --      Flowsheet Row Video Visit from 07/21/2021 in [place] Office Visit from 07/12/2021 in [place] Video Visit from 02/11/2021 in [place] Error: Q3, 4, or 5 should not be populated when Q2 is No High Risk No Risk        Assessment and Plan:  Miranda Harris is a 60 y.o. 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   1. Bipolar 1 disorder ([place]) 2. Insomnia, unspecified type She continues to report depressive symptoms with passive SI since the last visit. Recent psychosocial stressors includes relocation/selling her old house.  Will start fluoxetine to target depression given patient reports good benefit in the past.  Will start from lower dose given she experienced drowsiness from this medication.  Will continue current dose of lamotrigine to target bipolar disorder.  She is not interested in further up titration given her experience of dizziness from much higher dose.   Will continue quetiapine for bipolar disorder.  Noted that although referral for ECT was discussed, she is not interested in this.  She is  also not interested in PHP, although she verbalized understanding that treatment option is there if she is interested.  We will plan to have a sooner visit to monitor closely given the severity of her symptoms.    This clinician has discussed the side effect associated with medication prescribed during this encounter. Please refer to notes in the previous encounters for more details.    Plan:   Continue lamotrigine 50 mg daily  (dizziness from 100 mg ) Continue quetiapine 150 mg at night (she self uptitrated, previously had headache, fatigue at higher dose) Start fluoxetine 10 mg daily Next appointment: 12/7 at 9:30 for 30 mins, video   Emergency resources which includes 911, ED, suicide crisis line [phone]) are discussed.    Past trials of medication: Sertraline, fluoxetine (drowsiness), Lexapro, Duloxetine, venlafaxine (initially worked well, stopped working, hair loss), Wellbutrin (insomnia),  Paxil (urinary retention), Latuda, Abilify (flu like symptoms), Haldol (EPS), Thorazine (EPS), Stelazine,  Lithium, Lunesta (sedation), Ambien (sleepwalking, driving, eating), Trazodone (excessive sedation), Xanax   I have reviewed suicide assessment in detail. No change in the following assessment.    The patient demonstrates the following  risk factors for suicide: Chronic risk factors for suicide include psychiatric disorder /bipolar disorder, history of substance use disorder, family history of suicide attempt. Acute risk factors for suicide include none. Protective factors for this patient include positive social support, responsibility to others (family), coping skills, hope for the future. Although patient reports passive SI, she adamantly denies any intent and has had no previous suicide attempt. Considering these factors, the overall suicide risk at this  point appears to be low.   [name], MD 08/18/2021, 10:37 AM

## 2021-08-17 ENCOUNTER — Telehealth: Payer: Medicare Other | Admitting: Psychiatry

## 2021-08-18 ENCOUNTER — Encounter: Payer: Self-pay | Admitting: Psychiatry

## 2021-08-18 ENCOUNTER — Other Ambulatory Visit: Payer: Self-pay

## 2021-08-18 ENCOUNTER — Telehealth (INDEPENDENT_AMBULATORY_CARE_PROVIDER_SITE_OTHER): Payer: 59 | Admitting: Psychiatry

## 2021-08-18 ENCOUNTER — Other Ambulatory Visit: Payer: Self-pay | Admitting: Internal Medicine

## 2021-08-18 DIAGNOSIS — E039 Hypothyroidism, unspecified: Secondary | ICD-10-CM

## 2021-08-18 DIAGNOSIS — G4700 Insomnia, unspecified: Secondary | ICD-10-CM | POA: Diagnosis not present

## 2021-08-18 DIAGNOSIS — F319 Bipolar disorder, unspecified: Secondary | ICD-10-CM

## 2021-08-18 MED ORDER — FLUOXETINE HCL 10 MG PO CAPS: 10.0000 mg | ORAL_CAPSULE | Freq: Every day | ORAL | 0 refills | Status: DC

## 2021-08-18 MED ORDER — LAMOTRIGINE 25 MG PO TABS: 50.0000 mg | ORAL_TABLET | Freq: Every day | ORAL | 0 refills | Status: DC

## 2021-08-18 NOTE — Patient Instructions (Addendum)
Continue lamotrigine 50 mg daily  Continue quetiapine 150 mg at night  Start fluoxetine 10 mg daily Next appointment: 12/7 at 9:30   CONTACT INFORMATION  What to do if you need to get in touch with someone regarding a psychiatric issue:  1. EMERGENCY: For psychiatric emergencies (if you are suicidal or if there are any other safety issues) call 911 and/or go to your nearest Emergency Room immediately.   2. IF YOU NEED SOMEONE TO TALK TO RIGHT NOW: Given my clinical responsibilities, I may not be able to speak with you over the phone for a prolonged period of time.  A. You may always call The National Suicide Prevention Lifeline at 1-800-273-TALK 503-580-9293).  \

## 2021-08-27 DIAGNOSIS — E782 Mixed hyperlipidemia: Secondary | ICD-10-CM | POA: Diagnosis not present

## 2021-08-27 DIAGNOSIS — Z0101 Encounter for examination of eyes and vision with abnormal findings: Secondary | ICD-10-CM | POA: Diagnosis not present

## 2021-08-27 DIAGNOSIS — E559 Vitamin D deficiency, unspecified: Secondary | ICD-10-CM | POA: Diagnosis not present

## 2021-08-27 DIAGNOSIS — N1831 Chronic kidney disease, stage 3a: Secondary | ICD-10-CM | POA: Diagnosis not present

## 2021-08-27 DIAGNOSIS — Z1211 Encounter for screening for malignant neoplasm of colon: Secondary | ICD-10-CM | POA: Diagnosis not present

## 2021-08-27 DIAGNOSIS — Z0001 Encounter for general adult medical examination with abnormal findings: Secondary | ICD-10-CM | POA: Diagnosis not present

## 2021-08-27 DIAGNOSIS — Z13820 Encounter for screening for osteoporosis: Secondary | ICD-10-CM | POA: Diagnosis not present

## 2021-08-27 DIAGNOSIS — Z131 Encounter for screening for diabetes mellitus: Secondary | ICD-10-CM | POA: Diagnosis not present

## 2021-08-27 DIAGNOSIS — Z23 Encounter for immunization: Secondary | ICD-10-CM | POA: Diagnosis not present

## 2021-08-27 DIAGNOSIS — E039 Hypothyroidism, unspecified: Secondary | ICD-10-CM | POA: Diagnosis not present

## 2021-08-30 NOTE — Progress Notes (Addendum)
Virtual Visit via Video Note  I connected with Miranda Harris on 09/01/21 at  9:30 AM EST by a video enabled telemedicine application and verified that I am speaking with the correct person using two identifiers.  Location: Patient: home Provider: office Persons participated in the visit- patient, provider    I discussed the limitations of evaluation and management by telemedicine and the availability of in person appointments. The patient expressed understanding and agreed to proceed.      I discussed the assessment and treatment plan with the patient. The patient was provided an opportunity to ask questions and all were answered. The patient agreed with the plan and demonstrated an understanding of the instructions.   The patient was advised to call back or seek an in-person evaluation if the symptoms worsen or if the condition fails to improve as anticipated.  I provided 18 minutes of non-face-to-face time during this encounter.   [name], MD     [facility] MD/PA/NP OP Progress Note  09/01/2021 10:05 AM Miranda Harris  MRN:  [id number]  Chief Complaint:  Chief Complaint   Follow-up; Other    HPI:  This is a follow-up appointment for bipolar disorder and insomnia.  She states that she has been doing better since starting fluoxetine.  She is more motivated to do things.  She has been getting back on routine, and started doing exercise including taking a walk again.  She is hoping to find a part-time job in January.  She quit the previous job due to the episode she had at night of being followed by somebody on the way back home.  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  She does well with being around with people even customers.  She is also planning to go to [place] with her husband for a short trip .  They have not been able to do any trips due to the health issues of her mother-in-law.  She has occasional anxiety; she is worried about her husband's health and her own health.   She sleeps 7 to 8 hours every day.  She has fair energy most of the time.  She denies change in appetite.  She feels less depressed.  She denies SI.  She denies decreased need for sleep or euphonia.  She feels comfortable to stay on the current medication regimen.   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 Bipolar 1 disorder (HCC)  F31.9     2. Insomnia, unspecified type  G47.00       Past Psychiatric History: Please see initial evaluation for full details. I have reviewed the history. No updates at this time.     Past Medical History:  Past Medical History:  Diagnosis Date   Bipolar disorder ([place])    Blood transfusion without reported diagnosis    Chronic kidney disease    Depression    Elevated serum creatinine    related to lithium use   Helicobacter pylori (H. pylori) infection    pt denies   Hypothyroidism    Perirectal fistula    Thyroid disease    Trochanteric bursitis of right hip     Past Surgical History:  Procedure Laterality Date   COLONOSCOPY  01/06/2016   at [facility] WITH HEMORRHOIDECTOMY N/A 06/28/2017   Procedure: EXAM UNDER  ANESTHESIA WITH EXTERNAL HEMORRHOIDECTOMY AND REPAIR OF PERIRECTAL FISTULA;  Surgeon: Michael Boston, MD;  Location: [facility];  Service: General;  Laterality: N/A;   HERNIA REPAIR  2-03 yrs ago   umbilical hernia   INCISION AND DRAINAGE PERIRECTAL ABSCESS N/A 04/18/2017   Procedure: IRRIGATION AND DEBRIDEMENT PERIRECTAL ABSCESS, placement of seton, sigmoidoscopy;  Surgeon: Excell Seltzer, MD;  Location: [facility];  Service: General;  Laterality: N/A;   LAPAROSCOPIC APPENDECTOMY     POLYPECTOMY     VAGINAL HYSTERECTOMY Bilateral    complete   WISDOM TOOTH EXTRACTION      Family Psychiatric History: Please see initial evaluation for  full details. I have reviewed the history. No updates at this time.     Family History:  Family History  Problem Relation Age of Onset   Anxiety disorder Brother    Breast cancer Neg Hx    Colon cancer Neg Hx    Colon polyps Neg Hx    Esophageal cancer Neg Hx    Rectal cancer Neg Hx    Stomach cancer Neg Hx     Social History:  Social History   Socioeconomic History   Marital status: Married    Spouse name: Not on file   Number of children: Not on file   Years of education: Not on file   Highest education level: Not on file  Occupational History   Not on file  Tobacco Use   Smoking status: Former    Packs/day: 1.00    Years: 30.00    Pack years: 30.00    Types: Cigarettes   Smokeless tobacco: Never   Tobacco comments:    quit 2008  Vaping Use   Vaping Use: Never used  Substance and Sexual Activity   Alcohol use: Yes    Comment: once a month per pt   Drug use: No   Sexual activity: Yes    Partners: Male    Birth control/protection: Surgical  Other Topics Concern   Not on file  Social History Narrative   Not on file   Social Determinants of Health   Financial Resource Strain: Not on file  Food Insecurity: Not on file  Transportation Needs: Not on file  Physical Activity: Not on file  Stress: Not on file  Social Connections: Not on file    Allergies:  Allergies  Allergen Reactions   Metrogel [Metronidazole] Other (See Comments)    Burn face   Tetracyclines & Related Rash    Metabolic Disorder Labs: Lab Results  Component Value Date   HGBA1C 5.5 09/04/2020   MPG 108 06/10/2015   No results found for: PROLACTIN Lab Results  Component Value Date   CHOL 205 (H) 12/25/2020   TRIG 76.0 12/25/2020   HDL 73.30 12/25/2020   CHOLHDL 3 12/25/2020   VLDL 15.2 12/25/2020   LDLCALC 116 (H) 12/25/2020   LDLCALC 122 (H) 09/04/2020   Lab Results  Component Value Date   TSH 0.05 (L) 05/28/2021   TSH 1.32 03/08/2021    Therapeutic Level Labs: Lab  Results  Component Value Date   LITHIUM 1.2 11/08/2018   LITHIUM 0.80 06/10/2015   No results found for: VALPROATE No components found for:  CBMZ  Current Medications: Current Outpatient Medications  Medication Sig Dispense Refill   Calcium Carbonate-Vitamin D (CALTRATE 600+D PO) Take 2 tablets by mouth daily.     [START ON 09/17/2021] FLUoxetine (PROZAC) 10 MG capsule Take 1 capsule (10 mg total) by mouth daily. [place]  capsule 0   lamoTRIgine (LAMICTAL) 25 MG tablet Take 2 tablets (50 mg total) by mouth daily. 60 tablet 0   levothyroxine (SYNTHROID) 150 MCG tablet Take 1 tablet (150 mcg total) by mouth daily. 90 tablet 1   levothyroxine (SYNTHROID) 175 MCG tablet TAKE 1 TABLET BY MOUTH EVERY DAY 90 tablet 0   Multiple Vitamin (MULTIVITAMIN WITH MINERALS) TABS tablet Take 1 tablet by mouth daily.     Omega-3 Fatty Acids (FISH OIL) 1000 MG CAPS Take 2,000 mg by mouth daily.     QUEtiapine 150 MG TABS Take 150 mg by mouth at bedtime. 90 tablet 0   Vitamin D, Ergocalciferol, (DRISDOL) 1.25 MG (50000 UNIT) CAPS capsule Take 1 capsule (50,000 Units total) by mouth every 7 (seven) days. 12 capsule 0   No current facility-administered medications for this visit.     Musculoskeletal: Strength & Muscle Tone:  N/A Gait & Station:  N/A Patient leans: N/A  Psychiatric Specialty Exam: Review of Systems  Psychiatric/Behavioral:  Positive for dysphoric mood and sleep disturbance. Negative for agitation, behavioral problems, confusion, decreased concentration, hallucinations, self-injury and suicidal ideas. The patient is nervous/anxious. The patient is not hyperactive.   All other systems reviewed and are negative.  There were no vitals taken for this visit.There is no height or weight on file to calculate BMI.  General Appearance: Fairly Groomed  Eye Contact:  Good  Speech:  Clear and Coherent  Volume:  Normal  Mood:   better  Affect:  Appropriate, Congruent, and brighter, smiles  Thought  Process:  Coherent  Orientation:  Full (Time, Place, and Person)  Thought Content: Logical   Suicidal Thoughts:  No  Homicidal Thoughts:  No  Memory:  Immediate;   Good  Judgement:  Good  Insight:  Good  Psychomotor Activity:  Normal  Concentration:  Concentration: Good and Attention Span: Good  Recall:  Good  Fund of Knowledge: Good  Language: Good  Akathisia:  No  Handed:  Right  AIMS (if indicated): not done  Assets:  Communication Skills Desire for Improvement  ADL's:  Intact  Cognition: WNL  Sleep:  Good   Screenings: PHQ2-9    Flowsheet Row Office Visit from 07/12/2021 in [facility] Video Visit from 02/11/2021 in [facility] Video Visit from 11/12/2020 in [facility] from 12/13/2019 in [facility] [place] from 11/08/2018 in [facility]  PHQ-2 Total Score 4 0 0 0 0  PHQ-9 Total Score 11 -- -- 6 --      Flowsheet Row Video Visit from 07/21/2021 in [facility] Office Visit from 07/12/2021 in [facility] Video Visit from 02/11/2021 in [facility] Error: Q3, 4, or 5 should not be populated when Q2 is No High Risk No Risk        Assessment and Plan:  Monaye Blackie is a 60 y.o. 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   1. Bipolar 1 disorder ([place]) 2. Insomnia, unspecified type There has been more improvement in depressive symptoms and an anxiety since starting fluoxetine.  She is future oriented , and is considering starting a part-time job again.  Will continue current medication regimen at this time to see if fluoxetine exerts its full benefit for  depression.  Will continue current dose of lamotrigine and quetiapine to target bipolar disorder.  She also reports great benefit for insomnia from quetiapine.    This clinician has discussed the side effect associated with medication prescribed during this encounter. Please refer to notes in the previous encounters for more details.    Plan:   Continue lamotrigine 50 mg daily  (dizziness from 100 mg ) - she declined refill Continue quetiapine 150 mg at night (she self uptitrated, previously had headache, fatigue at higher dose) - she has 50 tabs left Continue fluoxetine 10 mg daily Next appointment: 1/17 at 2 PM for 30 mins, video   Emergency resources which includes 911, ED, suicide crisis line [phone]) are discussed.     Past trials of medication: Sertraline, fluoxetine (drowsiness), Lexapro, Duloxetine, venlafaxine (initially worked well, stopped working, hair loss), Wellbutrin (insomnia),  Paxil (urinary retention), Latuda, Abilify (flu like symptoms), Haldol (EPS), Thorazine (EPS), Stelazine,  Lithium, Lunesta (sedation), Ambien (sleepwalking, driving, eating), Trazodone (excessive sedation), Xanax   The patient demonstrates the following  risk factors for suicide: Chronic risk factors for suicide include psychiatric disorder /bipolar disorder, history of substance use disorder, family history of suicide attempt. Acute risk factors for suicide include none. Protective factors for this patient include positive social support, responsibility to others (family), coping skills, hope for the future. The overall suicide risk at this point appears to be low, and she is appropriate for outpatient follow up.   [name], MD 09/01/2021, 10:05 AM

## 2021-09-01 ENCOUNTER — Telehealth: Payer: Self-pay

## 2021-09-01 ENCOUNTER — Telehealth (INDEPENDENT_AMBULATORY_CARE_PROVIDER_SITE_OTHER): Payer: 59 | Admitting: Psychiatry

## 2021-09-01 ENCOUNTER — Encounter: Payer: Self-pay | Admitting: Psychiatry

## 2021-09-01 ENCOUNTER — Other Ambulatory Visit: Payer: Self-pay

## 2021-09-01 ENCOUNTER — Other Ambulatory Visit: Payer: Self-pay | Admitting: Psychiatry

## 2021-09-01 DIAGNOSIS — G4700 Insomnia, unspecified: Secondary | ICD-10-CM | POA: Diagnosis not present

## 2021-09-01 DIAGNOSIS — F319 Bipolar disorder, unspecified: Secondary | ICD-10-CM | POA: Diagnosis not present

## 2021-09-01 MED ORDER — FLUOXETINE HCL 10 MG PO CAPS: 10.0000 mg | ORAL_CAPSULE | Freq: Every day | ORAL | 0 refills | Status: DC

## 2021-09-01 NOTE — Patient Instructions (Signed)
Continue lamotrigine 50 mg daily   Continue quetiapine 150 mg at night  Continue fluoxetine 10 mg daily Next appointment: 1/17 at 2 PM

## 2021-09-01 NOTE — Telephone Encounter (Signed)
Could you verify with the pharmacy. It was sent for 90 days this morning.

## 2021-09-01 NOTE — Telephone Encounter (Signed)
pt left a message that she does need a refill on the prozac when she call pharamcy they stated that she didn't have any more refill.  she needs 90 day supply for her insurance.

## 2021-09-02 ENCOUNTER — Other Ambulatory Visit: Payer: Self-pay | Admitting: Internal Medicine

## 2021-09-03 DIAGNOSIS — R21 Rash and other nonspecific skin eruption: Secondary | ICD-10-CM | POA: Diagnosis not present

## 2021-09-29 DIAGNOSIS — N183 Chronic kidney disease, stage 3 unspecified: Secondary | ICD-10-CM | POA: Diagnosis not present

## 2021-10-04 ENCOUNTER — Telehealth: Payer: Self-pay

## 2021-10-04 ENCOUNTER — Other Ambulatory Visit: Payer: Self-pay | Admitting: Psychiatry

## 2021-10-04 MED ORDER — QUETIAPINE FUMARATE 150 MG PO TABS: 150.0000 mg | ORAL_TABLET | Freq: Every day | ORAL | 0 refills | Status: DC

## 2021-10-04 NOTE — Telephone Encounter (Signed)
pt called left a message that she needs refill on her medications or at least enough to get to her next appt.

## 2021-10-04 NOTE — Telephone Encounter (Signed)
Medication refill - Telephone call with pt, after she left another message today that she will run out of Quetiapine 150 mg prior to appointment 10/12/21.  Pt stated she only had a few days left and is worried about not sleeping if she runs out. Patient was aware that several months since last order 07/12/21 were 31 days and that is why she will run out prior to appointment 10/12/21.  Agreed to send request to Dr. Modesta Messing to send this one in prior to appointment if approved to her [facility] in [place].

## 2021-10-04 NOTE — Telephone Encounter (Signed)
Ordered a refill. However, I am not sure why she would run out this medication soon as it was ordered on 10/15 for 90 days. Please advise her to take one tab at night just to make sure.

## 2021-10-04 NOTE — Telephone Encounter (Signed)
Medication management - Telephone call with patient to inform Dr. Modesta Messing had sent in requested refill of Quetiapine 150 mg, one at bedtime and verified with pt instructions to only take one each night.  Patient reported that was what she is taking and plans to keep appointment with Dr. Modesta Messing on 10/12/21.

## 2021-10-04 NOTE — Telephone Encounter (Signed)
Could you ask her which meds she needs refill. Only meds she asked for refill was fluoxetine at the last visit, and it was ordered for 90 days; she should have enough of this medication.

## 2021-10-08 DIAGNOSIS — R8281 Pyuria: Secondary | ICD-10-CM | POA: Diagnosis not present

## 2021-10-08 DIAGNOSIS — F319 Bipolar disorder, unspecified: Secondary | ICD-10-CM | POA: Diagnosis not present

## 2021-10-08 DIAGNOSIS — T43595S Adverse effect of other antipsychotics and neuroleptics, sequela: Secondary | ICD-10-CM | POA: Diagnosis not present

## 2021-10-08 DIAGNOSIS — G5600 Carpal tunnel syndrome, unspecified upper limb: Secondary | ICD-10-CM | POA: Diagnosis not present

## 2021-10-08 DIAGNOSIS — N183 Chronic kidney disease, stage 3 unspecified: Secondary | ICD-10-CM | POA: Diagnosis not present

## 2021-10-08 DIAGNOSIS — T56891A Toxic effect of other metals, accidental (unintentional), initial encounter: Secondary | ICD-10-CM | POA: Diagnosis not present

## 2021-10-08 DIAGNOSIS — N1419 Nephropathy induced by other drugs, medicaments and biological substances: Secondary | ICD-10-CM | POA: Diagnosis not present

## 2021-10-11 NOTE — Progress Notes (Signed)
Virtual Visit via Video Note  I connected with Miranda Harris on 10/12/21 at  2:00 PM EST by a video enabled telemedicine application and verified that I am speaking with the correct person using two identifiers.  Location: Patient: home Provider: office Persons participated in the visit- patient, provider    I discussed the limitations of evaluation and management by telemedicine and the availability of in person appointments. The patient expressed understanding and agreed to proceed.  I discussed the assessment and treatment plan with the patient. The patient was provided an opportunity to ask questions and all were answered. The patient agreed with the plan and demonstrated an understanding of the instructions.   The patient was advised to call back or seek an in-person evaluation if the symptoms worsen or if the condition fails to improve as anticipated.  I provided 21 minutes of non-face-to-face time during this encounter.   [name], MD    [facility] MD/PA/NP OP Progress Note  10/12/2021 2:36 PM Miranda Harris  MRN:  [id number]  Chief Complaint:  Chief Complaint   Follow-up; Other    HPI:  This is a follow-up appointment for bipolar disorder and insomnia.  She states that she has been doing good.  She had a good holiday with her husband.  She did video with her son and her daughter-in-law as they could not come due to work.  She reports great relationship with him.  she and her husband is planning to visit them in several months, and she is looking forward to it.  She is searching for a part-time job.  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  She denies feeling depressed since the last visit.  She has more energy, and reports better concentration.  She denies change in weight or appetite.  She denies SI.  She feels less anxious.  She denies panic attacks.  She sleeps better lately.  She denies decreased need for sleep or euphonia.  Noted  that she states that she was taking double dose of quetiapine while she was on a trip as a bed was not comfortable.  She also tried to get medications sooner as usually takes time for the pharmacy to get ready for the medication.  She feels comfortable to stay on the current medication regimen.   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 Bipolar 1 disorder (HCC)  F31.9     2. Insomnia, unspecified type  G47.00       Past Psychiatric History: Please see initial evaluation for full details. I have reviewed the history. No updates at this time.    Past Medical History:  Past Medical History:  Diagnosis Date   Bipolar disorder ([place])    Blood transfusion without reported diagnosis    Chronic kidney disease    Depression    Elevated serum creatinine    related to lithium use   Helicobacter pylori (H. pylori) infection    pt denies   Hypothyroidism    Perirectal fistula    Thyroid disease    Trochanteric bursitis  of right hip     Past Surgical History:  Procedure Laterality Date   COLONOSCOPY  01/06/2016   at [facility] WITH HEMORRHOIDECTOMY N/A 06/28/2017   Procedure: EXAM UNDER ANESTHESIA WITH EXTERNAL HEMORRHOIDECTOMY AND REPAIR OF PERIRECTAL FISTULA;  Surgeon: Michael Boston, MD;  Location: [facility];  Service: General;  Laterality: N/A;   HERNIA REPAIR  4-69 yrs ago   umbilical hernia   INCISION AND DRAINAGE PERIRECTAL ABSCESS N/A 04/18/2017   Procedure: IRRIGATION AND DEBRIDEMENT PERIRECTAL ABSCESS, placement of seton, sigmoidoscopy;  Surgeon: Excell Seltzer, MD;  Location: [facility];  Service: General;   Laterality: N/A;   LAPAROSCOPIC APPENDECTOMY     POLYPECTOMY     VAGINAL HYSTERECTOMY Bilateral    complete   WISDOM TOOTH EXTRACTION      Family Psychiatric History: Please see initial evaluation for full details. I have reviewed the history. No updates at this time.     Family History:  Family History  Problem Relation Age of Onset   Anxiety disorder Brother    Breast cancer Neg Hx    Colon cancer Neg Hx    Colon polyps Neg Hx    Esophageal cancer Neg Hx    Rectal cancer Neg Hx    Stomach cancer Neg Hx     Social History:  Social History   Socioeconomic History   Marital status: Married    Spouse name: Not on file   Number of children: Not on file   Years of education: Not on file   Highest education level: Not on file  Occupational History   Not on file  Tobacco Use   Smoking status: Former    Packs/day: 1.00    Years: 30.00    Pack years: 30.00    Types: Cigarettes   Smokeless tobacco: Never   Tobacco comments:    quit 2008  Vaping Use   Vaping Use: Never used  Substance and Sexual Activity   Alcohol use: Yes    Comment: once a month per pt   Drug use: No   Sexual activity: Yes    Partners: Male    Birth control/protection: Surgical  Other Topics Concern   Not on file  Social History Narrative   Not on file   Social Determinants of Health   Financial Resource Strain: Not on file  Food Insecurity: Not on file  Transportation Needs: Not on file  Physical Activity: Not on file  Stress: Not on file  Social Connections: Not on file    Allergies:  Allergies  Allergen Reactions   Metrogel [Metronidazole] Other (See Comments)    Burn face   Tetracyclines & Related Rash    Metabolic Disorder Labs: Lab Results  Component Value Date   HGBA1C 5.5 09/04/2020   MPG 108 06/10/2015   No results found for: PROLACTIN Lab Results  Component Value Date   CHOL 205 (H) 12/25/2020   TRIG 76.0 12/25/2020   HDL 73.30 12/25/2020   CHOLHDL 3 12/25/2020    VLDL 15.2 12/25/2020   LDLCALC 116 (H) 12/25/2020   LDLCALC 122 (H) 09/04/2020   Lab Results  Component Value Date   TSH 0.05 (L) 05/28/2021   TSH 1.32 03/08/2021    Therapeutic Level Labs: Lab Results  Component Value Date   LITHIUM 1.2 11/08/2018   LITHIUM 0.80 06/10/2015   No results found for: VALPROATE No components found for:  CBMZ  Current Medications: Current Outpatient Medications  Medication Sig Dispense  Refill   Calcium Carbonate-Vitamin D (CALTRATE 600+D PO) Take 2 tablets by mouth daily.     FLUoxetine (PROZAC) 10 MG capsule Take 1 capsule (10 mg total) by mouth daily. 90 capsule 0   [START ON 10/19/2021] lamoTRIgine (LAMICTAL) 25 MG tablet Take 2 tablets (50 mg total) by mouth daily. 180 tablet 0   levothyroxine (SYNTHROID) 150 MCG tablet Take 1 tablet (150 mcg total) by mouth daily. 90 tablet 1   levothyroxine (SYNTHROID) 175 MCG tablet TAKE 1 TABLET BY MOUTH EVERY DAY 90 tablet 0   Multiple Vitamin (MULTIVITAMIN WITH MINERALS) TABS tablet Take 1 tablet by mouth daily.     Omega-3 Fatty Acids (FISH OIL) 1000 MG CAPS Take 2,000 mg by mouth daily.     QUEtiapine Fumarate 150 MG TABS Take 150 mg by mouth at bedtime. 90 tablet 0   Vitamin D, Ergocalciferol, (DRISDOL) 1.25 MG (50000 UNIT) CAPS capsule Take 1 capsule (50,000 Units total) by mouth every 7 (seven) days. 12 capsule 0   No current facility-administered medications for this visit.     Musculoskeletal: Strength & Muscle Tone:  N/A Gait & Station:  N/A Patient leans: N/A  Psychiatric Specialty Exam: Review of Systems  Psychiatric/Behavioral:  Negative for agitation, behavioral problems, confusion, decreased concentration, dysphoric mood, hallucinations, self-injury, sleep disturbance and suicidal ideas. The patient is not nervous/anxious and is not hyperactive.   All other systems reviewed and are negative.  There were no vitals taken for this visit.There is no height or weight on file to calculate  BMI.  General Appearance: Fairly Groomed  Eye Contact:  Good  Speech:  Clear and Coherent  Volume:  Normal  Mood:   good  Affect:  Appropriate, Congruent, and Full Range  Thought Process:  Coherent  Orientation:  Full (Time, Place, and Person)  Thought Content: Logical   Suicidal Thoughts:  No  Homicidal Thoughts:  No  Memory:  Immediate;   Good  Judgement:  Good  Insight:  Good  Psychomotor Activity:  Normal  Concentration:  Concentration: Good and Attention Span: Good  Recall:  Good  Fund of Knowledge: Good  Language: Good  Akathisia:  No  Handed:  Right  AIMS (if indicated): not done  Assets:  Communication Skills Desire for Improvement  ADL's:  Intact  Cognition: WNL  Sleep:  Fair   Screenings: PHQ2-9    Flowsheet Row Video Visit from 10/12/2021 in [facility] Visit from 07/12/2021 in [facility] Video Visit from 02/11/2021 in [facility] Video Visit from 11/12/2020 in [facility] from 12/13/2019 in [facility]  PHQ-2 Total Score 0 4 0 0 0  PHQ-9 Total Score -- 11 -- -- 6      Flowsheet Row Video Visit from 10/12/2021 in [facility] Video Visit from 07/21/2021 in [facility] Office Visit from 07/12/2021 in [facility] No Risk Error: Q3, 4, or 5 should not be populated when Q2 is No High Risk        Assessment and Plan:  Miranda Harris is a 61 y.o. 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    1. Bipolar 1 disorder ([place]) 2. Insomnia, unspecified type There has been steady improvement in depressive symptoms and anxiety since  starting  fluoxetine.  She reports great relationship with her husband and her son, and is hoping to start her part-time job.  Will continue current dose of lamotrigine to target bipolar disorder.  Will continue quetiapine to target bipolar disorder and then also to target insomnia.  Will continue fluoxetine to target depression and anxiety.    This clinician has discussed the side effect associated with medication prescribed during this encounter. Please refer to notes in the previous encounters for more details.      Plan:   Continue lamotrigine 50 mg daily  (dizziness from 100 mg )  Continue quetiapine 150 mg at night (she self uptitrated, previously had headache, fatigue at higher dose)  Continue fluoxetine 10 mg daily Next appointment: 3/21 at 11 AM for 30 mins, video   Past trials of medication: Sertraline, fluoxetine (drowsiness), Lexapro, Duloxetine, venlafaxine (initially worked well, stopped working, hair loss), Wellbutrin (insomnia),  Paxil (urinary retention), Latuda, Abilify (flu like symptoms), Haldol (EPS), Thorazine (EPS), Stelazine,  Lithium, Lunesta (sedation), Ambien (sleepwalking, driving, eating), Trazodone (excessive sedation), Xanax   The patient demonstrates the following  risk factors for suicide: Chronic risk factors for suicide include psychiatric disorder /bipolar disorder, history of substance use disorder, family history of suicide attempt. Acute risk factors for suicide include none. Protective factors for this patient include positive social support, responsibility to others (family), coping skills, hope for the future. The overall suicide risk at this point appears to be low, and she is appropriate for outpatient follow up.     [name], MD 10/12/2021, 2:36 PM

## 2021-10-12 ENCOUNTER — Other Ambulatory Visit: Payer: Self-pay

## 2021-10-12 ENCOUNTER — Telehealth (INDEPENDENT_AMBULATORY_CARE_PROVIDER_SITE_OTHER): Payer: 59 | Admitting: Psychiatry

## 2021-10-12 ENCOUNTER — Encounter: Payer: Self-pay | Admitting: Psychiatry

## 2021-10-12 DIAGNOSIS — F319 Bipolar disorder, unspecified: Secondary | ICD-10-CM | POA: Diagnosis not present

## 2021-10-12 DIAGNOSIS — G4700 Insomnia, unspecified: Secondary | ICD-10-CM

## 2021-10-12 MED ORDER — LAMOTRIGINE 25 MG PO TABS: 50.0000 mg | ORAL_TABLET | Freq: Every day | ORAL | 0 refills | Status: DC

## 2021-10-12 NOTE — Patient Instructions (Addendum)
Continue lamotrigine 50 mg daily   Continue quetiapine 150 mg at night  Continue fluoxetine 10 mg daily Next appointment: 3/21 at 11 AM, video

## 2021-10-20 ENCOUNTER — Other Ambulatory Visit: Payer: Self-pay | Admitting: Psychiatry

## 2021-11-11 ENCOUNTER — Other Ambulatory Visit: Payer: Self-pay

## 2021-11-11 ENCOUNTER — Encounter: Payer: Self-pay | Admitting: Podiatry

## 2021-11-11 ENCOUNTER — Ambulatory Visit (INDEPENDENT_AMBULATORY_CARE_PROVIDER_SITE_OTHER): Payer: 59 | Admitting: Podiatry

## 2021-11-11 DIAGNOSIS — M205X1 Other deformities of toe(s) (acquired), right foot: Secondary | ICD-10-CM | POA: Diagnosis not present

## 2021-11-11 NOTE — Progress Notes (Signed)
°  Subjective:  Patient ID: Miranda Harris, female    DOB: 1961-01-11,   MRN: [id number]  Chief Complaint  Patient presents with   Toe Pain    right great toe pain for a while but having flare up after walking    61 y.o. female presents for right great toe pain that has been present for a couple months. Relates the pain is at the base of the toe an dis tender to touch. She has been icing it with some releif. Has been painful to walk and has to wear certain shoes. She denies redness or swelling . Denies any other pedal complaints. Denies n/v/f/c.   Past Medical History:  Diagnosis Date   Bipolar disorder ([place])    Blood transfusion without reported diagnosis    Chronic kidney disease    Depression    Elevated serum creatinine    related to lithium use   Helicobacter pylori (H. pylori) infection    pt denies   Hypothyroidism    Perirectal fistula    Thyroid disease    Trochanteric bursitis of right hip     Objective:  Physical Exam: Vascular: DP/PT pulses 2/4 bilateral. CFT <3 seconds. Normal hair growth on digits. No edema.  Skin. No lacerations or abrasions bilateral feet.  Musculoskeletal: MMT 5/5 bilateral lower extremities in DF, PF, Inversion and Eversion. Deceased ROM in DF of ankle joint. Decreased ROM of first MPJ on right. Pain with DF and PF. Pain to dorsal and medial aspect of joint. Mild HAV deformity noted.  Neurological: Sensation intact to light touch.   Assessment:   1. Hallux limitus, right      Plan:  Patient was evaluated and treated and all questions answered. -Patient refused X-ray today.  -Discussed hallux limitus and  treatement options; conservative and  Surgical management; risks, benefits, alternatives discussed. All patient's questions answered. -unable to take NSAIDs due to kidney disease. . -Deferred injection today.   -Recommend continue with good supportive shoes and inserts. Discussed stiff soled shoes and use of carbon fiber foot plate.    -Patient to return to office as needed or sooner if condition worsens.   Lorenda Peck, DPM

## 2021-12-09 NOTE — Progress Notes (Signed)
Virtual Visit via Video Note ? ?I connected with Miranda Harris on 12/14/21 at 11:00 AM EDT by a video enabled telemedicine application and verified that I am speaking with the correct person using two identifiers. ? ?Location: ?Patient: home ?Provider: office ?Persons participated in the visit- patient, provider  ?  ?I discussed the limitations of evaluation and management by telemedicine and the availability of in person appointments. The patient expressed understanding and agreed to proceed. ? ? ?  ?I discussed the assessment and treatment plan with the patient. The patient was provided an opportunity to ask questions and all were answered. The patient agreed with the plan and demonstrated an understanding of the instructions. ?  ?The patient was advised to call back or seek an in-person evaluation if the symptoms worsen or if the condition fails to improve as anticipated. ? ?I provided 20 minutes of non-face-to-face time during this encounter. ? ? ?Miranda Clay, MD ? ? ? ?[facility] MD/PA/NP OP Progress Note ? ?12/14/2021 11:45 AM ?Miranda Harris  ?MRN:  [id number] ? ?Chief Complaint:  ?Chief Complaint  ?Patient presents with  ? Follow-up  ? Other  ? ?HPI:  ?This stays at home appointment for bipolar disorder and insomnia.  ?She states that she has been doing well.  She has an upcoming trip to visit her son in [place], and also to go to [place] with her husband.  She is looking forward to these.  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  She has been busy working on the house and yard.  She reports good energy to do the things, although she tends to be more tired compared to the time she used to be working.  She states that there since she sleeps in a separate bedroom, referring to a CPAP machine her husband uses.  She denies change in appetite.  She denies feeling depressed or anhedonia.  She denies SI.  She  comes a few anxious whenever she goes shopping.  She denies decreased need for sleep, euphonia.  She rarely drinks alcohol.  She denies drug use.  She has been taking ginkgo biloba for the past year, which she has found to be helpful for memory/concentration.  She believes her current medication, especially lamotrigine has been very helpful; she is willing to stay on the same medication regimen.  ? 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 ?Visit Diagnosis:  ?  ICD-10-CM   ?1. Bipolar 1 disorder ([place])  F31.9   ?  ?2. Insomnia, unspecified type  G47.00   ?  ? ? ?Past Psychiatric History: Please see initial evaluation for full details. I have reviewed the history. No updates at this time.  ?  ? ?Past Medical History:  ?Past Medical History:  ?Diagnosis Date  ? Bipolar disorder ([place])   ? Blood transfusion without reported diagnosis   ? Chronic kidney disease   ? Depression   ? Elevated serum creatinine   ? related to lithium use  ? Helicobacter pylori (H. pylori) infection   ? pt denies  ? Hypothyroidism   ? Perirectal fistula   ? Thyroid disease   ? Trochanteric bursitis of right hip   ?  ?Past Surgical History:  ?Procedure Laterality Date  ? COLONOSCOPY  01/06/2016  ?  at [facility]  ? EVALUATION UNDER ANESTHESIA WITH HEMORRHOIDECTOMY N/A 06/28/2017  ? Procedure: EXAM UNDER ANESTHESIA WITH EXTERNAL HEMORRHOIDECTOMY AND REPAIR OF PERIRECTAL FISTULA;  Surgeon: Michael Boston, MD;  Location: [facility];  Service: General;  Laterality: N/A;  ? HERNIA REPAIR  7-98 yrs ago  ? umbilical hernia  ? INCISION AND DRAINAGE PERIRECTAL ABSCESS N/A 04/18/2017  ? Procedure: IRRIGATION AND DEBRIDEMENT PERIRECTAL ABSCESS, placement of seton, sigmoidoscopy;  Surgeon: Excell Seltzer, MD;  Location: [facility];  Service: General;  Laterality: N/A;  ? LAPAROSCOPIC APPENDECTOMY    ? POLYPECTOMY     ? VAGINAL HYSTERECTOMY Bilateral   ? complete  ? WISDOM TOOTH EXTRACTION    ? ? ?Family Psychiatric History: Please see initial evaluation for full details. I have reviewed the history. No updates at this time.  ?  ? ?Family History:  ?Family History  ?Problem Relation Age of Onset  ? Anxiety disorder Brother   ? Breast cancer Neg Hx   ? Colon cancer Neg Hx   ? Colon polyps Neg Hx   ? Esophageal cancer Neg Hx   ? Rectal cancer Neg Hx   ? Stomach cancer Neg Hx   ? ? ?Social History:  ?Social History  ? ?Socioeconomic History  ? Marital status: Married  ?  Spouse name: Not on file  ? Number of children: Not on file  ? Years of education: Not on file  ? Highest education level: Not on file  ?Occupational History  ? Not on file  ?Tobacco Use  ? Smoking status: Former  ?  Packs/day: 1.00  ?  Years: 30.00  ?  Pack years: 30.00  ?  Types: Cigarettes  ? Smokeless tobacco: Never  ? Tobacco comments:  ?  quit 2008  ?Vaping Use  ? Vaping Use: Never used  ?Substance and Sexual Activity  ? Alcohol use: Yes  ?  Comment: once a month per pt  ? Drug use: No  ? Sexual activity: Yes  ?  Partners: Male  ?  Birth control/protection: Surgical  ?Other Topics Concern  ? Not on file  ?Social History Narrative  ? Not on file  ? ?Social Determinants of Health  ? ?Financial Resource Strain: Not on file  ?Food Insecurity: Not on file  ?Transportation Needs: Not on file  ?Physical Activity: Not on file  ?Stress: Not on file  ?Social Connections: Not on file  ? ? ?Allergies:  ?Allergies  ?Allergen Reactions  ? Metrogel [Metronidazole] Other (See Comments)  ?  Burn face  ? Tetracyclines & Related Rash  ? ? ?Metabolic Disorder Labs: ?Lab Results  ?Component Value Date  ? HGBA1C 5.5 09/04/2020  ? MPG 108 06/10/2015  ? ?No results found for: PROLACTIN ?Lab Results  ?Component Value Date  ? CHOL 205 (H) 12/25/2020  ? TRIG 76.0 12/25/2020  ? HDL 73.30 12/25/2020  ? CHOLHDL 3 12/25/2020  ? VLDL 15.2 12/25/2020  ? LDLCALC 116 (H) 12/25/2020  ? LDLCALC  122 (H) 09/04/2020  ? ?Lab Results  ?Component Value Date  ? TSH 0.05 (L) 05/28/2021  ? TSH 1.32 03/08/2021  ? ? ?Therapeutic Level Labs: ?Lab Results  ?Component Value Date  ? LITHIUM 1.2 11/08/2018  ? LITHIUM 0.80 06/10/2015  ? ?No results found for: VALPROATE ?No components found for:  CBMZ ? ?Current Medications: ?Current Outpatient Medications  ?Medication Sig Dispense Refill  ? Calcium Carbonate-Vitamin D (CALTRATE 600+D PO) Take 2 tablets by mouth daily.    ? [START ON  12/17/2021] FLUoxetine (PROZAC) 10 MG capsule Take 1 capsule (10 mg total) by mouth daily. 90 capsule 0  ? [START ON 01/18/2022] lamoTRIgine (LAMICTAL) 25 MG tablet Take 2 tablets (50 mg total) by mouth daily. 180 tablet 1  ? levothyroxine (SYNTHROID) 150 MCG tablet Take 1 tablet (150 mcg total) by mouth daily. 90 tablet 1  ? levothyroxine (SYNTHROID) 175 MCG tablet TAKE 1 TABLET BY MOUTH EVERY DAY 90 tablet 0  ? Multiple Vitamin (MULTIVITAMIN WITH MINERALS) TABS tablet Take 1 tablet by mouth daily.    ? Omega-3 Fatty Acids (FISH OIL) 1000 MG CAPS Take 2,000 mg by mouth daily.    ? [START ON 01/03/2022] QUEtiapine Fumarate 150 MG TABS Take 150 mg by mouth at bedtime. 90 tablet 0  ? Vitamin D, Ergocalciferol, (DRISDOL) 1.25 MG (50000 UNIT) CAPS capsule Take 1 capsule (50,000 Units total) by mouth every 7 (seven) days. 12 capsule 0  ? ?No current facility-administered medications for this visit.  ? ? ? ?Musculoskeletal: ?Strength & Muscle Tone:  N/A ?Gait & Station:  N/A ?Patient leans: N/A ? ?Psychiatric Specialty Exam: ?Review of Systems  ?Psychiatric/Behavioral:  Negative for agitation, behavioral problems, confusion, decreased concentration, dysphoric mood, hallucinations, self-injury, sleep disturbance and suicidal ideas. The patient is nervous/anxious. The patient is not hyperactive.   ?All other systems reviewed and are negative.  ?There were no vitals taken for this visit.There is no height or weight on file to calculate BMI.  ?General  Appearance: Fairly Groomed  ?Eye Contact:  Good  ?Speech:  Clear and Coherent  ?Volume:  Normal  ?Mood:   good  ?Affect:  Appropriate, Congruent, and euthymic  ?Thought Process:  Coherent  ?Orientation:  Full

## 2021-12-14 ENCOUNTER — Telehealth (INDEPENDENT_AMBULATORY_CARE_PROVIDER_SITE_OTHER): Payer: 59 | Admitting: Psychiatry

## 2021-12-14 ENCOUNTER — Encounter: Payer: Self-pay | Admitting: Psychiatry

## 2021-12-14 ENCOUNTER — Other Ambulatory Visit: Payer: Self-pay

## 2021-12-14 DIAGNOSIS — F319 Bipolar disorder, unspecified: Secondary | ICD-10-CM | POA: Diagnosis not present

## 2021-12-14 DIAGNOSIS — G4700 Insomnia, unspecified: Secondary | ICD-10-CM | POA: Diagnosis not present

## 2021-12-14 MED ORDER — LAMOTRIGINE 25 MG PO TABS: 50.0000 mg | ORAL_TABLET | Freq: Every day | ORAL | 1 refills | Status: DC

## 2021-12-14 MED ORDER — FLUOXETINE HCL 10 MG PO CAPS: 10.0000 mg | ORAL_CAPSULE | Freq: Every day | ORAL | 0 refills | Status: DC

## 2021-12-14 MED ORDER — QUETIAPINE FUMARATE 150 MG PO TABS: 150.0000 mg | ORAL_TABLET | Freq: Every day | ORAL | 0 refills | Status: DC

## 2021-12-14 NOTE — Patient Instructions (Addendum)
Continue lamotrigine 50 mg daily  ?Continue quetiapine 150 mg at night  ?Continue fluoxetine 10 mg daily ?Next appointment: 5/24 at 9 AM, video ?

## 2022-01-31 ENCOUNTER — Encounter: Payer: Self-pay | Admitting: Psychiatry

## 2022-01-31 ENCOUNTER — Telehealth (INDEPENDENT_AMBULATORY_CARE_PROVIDER_SITE_OTHER): Payer: 59 | Admitting: Psychiatry

## 2022-01-31 DIAGNOSIS — F319 Bipolar disorder, unspecified: Secondary | ICD-10-CM | POA: Diagnosis not present

## 2022-01-31 DIAGNOSIS — G4700 Insomnia, unspecified: Secondary | ICD-10-CM

## 2022-01-31 NOTE — Patient Instructions (Signed)
Continue lamotrigine 50 mg daily   ?Continue quetiapine 150 mg at night  ?Increase fluoxetine 20 mg daily  ?Next appointment: 6/2 at 8 AM  ?

## 2022-01-31 NOTE — Progress Notes (Signed)
Virtual Visit via Video Note ? ?I connected with Miranda Harris on 01/31/22 at  9:30 AM EDT by a video enabled telemedicine application and verified that I am speaking with the correct person using two identifiers. ? ?Location: ?Patient: home ?Provider: office ?Persons participated in the visit- patient, provider  ?  ?I discussed the limitations of evaluation and management by telemedicine and the availability of in person appointments. The patient expressed understanding and agreed to proceed. ?  ?I discussed the assessment and treatment plan with the patient. The patient was provided an opportunity to ask questions and all were answered. The patient agreed with the plan and demonstrated an understanding of the instructions. ?  ?The patient was advised to call back or seek an in-person evaluation if the symptoms worsen or if the condition fails to improve as anticipated. ? ?I provided 15 minutes of non-face-to-face time during this encounter. ? ? ?Miranda Clay, MD ? ? ? ?[place] MD/PA/NP OP Progress Note ? ?01/31/2022 10:06 AM ?Miranda Harris  ?MRN:  [id number] ? ?Chief Complaint:  ?Chief Complaint  ?Patient presents with  ? Follow-up  ? Other  ? ?HPI:  ?This is a follow-up appointment for bipolar disorder and insomnia.  ?This appointment was made due to patient request.  ?She states that she has been feeling depressed for the past few weeks.  She tends to be preoccupied with thoughts that she is not enough.  Her self-esteem is down.  She is forgetful, and has issues with concentration.  She applied for a job at [employer], and she was accepted.  She will start this new job tomorrow.  She feels anxious about this, feeling that she is not good enough.  She still wants to start this job, although she acknowledged the option of leaving if her mood to be worsened. She had a trip to [place] to see her son.  However, she wanted to be somewhere else when she visited [place]. She reports alright relationship with her  husband.  She sleeps well.  She denies change in appetite.  Although she reports SI, she denies any intent or plan.  She cannot do it considering her family, referring to the history of her father.  She denies decreased need for sleep or euphonia. She feels weary of upttiration of lamotrigine or quetiapine. She is willing to try higher dose of fluoxetine at this time.  ? ?Visit Diagnosis:  ?  ICD-10-CM   ?1. Bipolar 1 disorder ([place])  F31.9   ?  ?2. Insomnia, unspecified type  G47.00   ?  ? ? ?Past Psychiatric History: Please see initial evaluation for full details. I have reviewed the history. No updates at this time.  ?  ? ?Past Medical History:  ?Past Medical History:  ?Diagnosis Date  ? Bipolar disorder ([place])   ? Blood transfusion without reported diagnosis   ? Chronic kidney disease   ? Depression   ? Elevated serum creatinine   ? related to lithium use  ? Helicobacter pylori (H. pylori) infection   ? pt denies  ? Hypothyroidism   ? Perirectal fistula   ? Thyroid disease   ? Trochanteric bursitis of right hip   ?  ?Past Surgical History:  ?Procedure Laterality Date  ? COLONOSCOPY  01/06/2016  ? [facility]  ? EVALUATION UNDER ANESTHESIA WITH HEMORRHOIDECTOMY N/A 06/28/2017  ? Procedure: EXAM UNDER ANESTHESIA WITH EXTERNAL HEMORRHOIDECTOMY AND REPAIR OF PERIRECTAL FISTULA;  Surgeon: Michael Boston, MD;  Location: [place];  Service: General;  Laterality: N/A;  ? HERNIA REPAIR  1-51 yrs ago  ? umbilical hernia  ? INCISION AND DRAINAGE PERIRECTAL ABSCESS N/A 04/18/2017  ? Procedure: IRRIGATION AND DEBRIDEMENT PERIRECTAL ABSCESS, placement of seton, sigmoidoscopy;  Surgeon: Excell Seltzer, MD;  Location: [facility];  Service: General;  Laterality: N/A;  ? LAPAROSCOPIC APPENDECTOMY    ? POLYPECTOMY    ? VAGINAL HYSTERECTOMY Bilateral   ? complete  ? WISDOM TOOTH EXTRACTION    ? ? ?Family Psychiatric History: Please see initial evaluation for full details. I have reviewed the history. No updates at this  time.  ?  ? ?Family History:  ?Family History  ?Problem Relation Age of Onset  ? Anxiety disorder Brother   ? Breast cancer Neg Hx   ? Colon cancer Neg Hx   ? Colon polyps Neg Hx   ? Esophageal cancer Neg Hx   ? Rectal cancer Neg Hx   ? Stomach cancer Neg Hx   ? ? ?Social History:  ?Social History  ? ?Socioeconomic History  ? Marital status: Married  ?  Spouse name: Not on file  ? Number of children: Not on file  ? Years of education: Not on file  ? Highest education level: Not on file  ?Occupational History  ? Not on file  ?Tobacco Use  ? Smoking status: Former  ?  Packs/day: 1.00  ?  Years: 30.00  ?  Pack years: 30.00  ?  Types: Cigarettes  ? Smokeless tobacco: Never  ? Tobacco comments:  ?  quit 2008  ?Vaping Use  ? Vaping Use: Never used  ?Substance and Sexual Activity  ? Alcohol use: Yes  ?  Comment: once a month per pt  ? Drug use: No  ? Sexual activity: Yes  ?  Partners: Male  ?  Birth control/protection: Surgical  ?Other Topics Concern  ? Not on file  ?Social History Narrative  ? Not on file  ? ?Social Determinants of Health  ? ?Financial Resource Strain: Not on file  ?Food Insecurity: Not on file  ?Transportation Needs: Not on file  ?Physical Activity: Not on file  ?Stress: Not on file  ?Social Connections: Not on file  ? ? ?Allergies:  ?Allergies  ?Allergen Reactions  ? Metrogel [Metronidazole] Other (See Comments)  ?  Burn face  ? Tetracyclines & Related Rash  ? ? ?Metabolic Disorder Labs: ?Lab Results  ?Component Value Date  ? HGBA1C 5.5 09/04/2020  ? MPG 108 06/10/2015  ? ?No results found for: PROLACTIN ?Lab Results  ?Component Value Date  ? CHOL 205 (H) 12/25/2020  ? TRIG 76.0 12/25/2020  ? HDL 73.30 12/25/2020  ? CHOLHDL 3 12/25/2020  ? VLDL 15.2 12/25/2020  ? LDLCALC 116 (H) 12/25/2020  ? LDLCALC 122 (H) 09/04/2020  ? ?Lab Results  ?Component Value Date  ? TSH 0.05 (L) 05/28/2021  ? TSH 1.32 03/08/2021  ? ? ?Therapeutic Level Labs: ?Lab Results  ?Component Value Date  ? LITHIUM 1.2 11/08/2018  ?  LITHIUM 0.80 06/10/2015  ? ?No results found for: VALPROATE ?No components found for:  CBMZ ? ?Current Medications: ?Current Outpatient Medications  ?Medication Sig Dispense Refill  ? Calcium Carbonate-Vitamin D (CALTRATE 600+D PO) Take 2 tablets by mouth daily.    ? FLUoxetine (PROZAC) 10 MG capsule Take 1 capsule (10 mg total) by mouth daily. 90 capsule 0  ? lamoTRIgine (LAMICTAL) 25 MG tablet Take 2 tablets (50 mg total) by mouth daily. 180 tablet 1  ? levothyroxine (  SYNTHROID) 150 MCG tablet Take 1 tablet (150 mcg total) by mouth daily. 90 tablet 1  ? levothyroxine (SYNTHROID) 175 MCG tablet TAKE 1 TABLET BY MOUTH EVERY DAY 90 tablet 0  ? Multiple Vitamin (MULTIVITAMIN WITH MINERALS) TABS tablet Take 1 tablet by mouth daily.    ? Omega-3 Fatty Acids (FISH OIL) 1000 MG CAPS Take 2,000 mg by mouth daily.    ? QUEtiapine Fumarate 150 MG TABS Take 150 mg by mouth at bedtime. 90 tablet 0  ? Vitamin D, Ergocalciferol, (DRISDOL) 1.25 MG (50000 UNIT) CAPS capsule Take 1 capsule (50,000 Units total) by mouth every 7 (seven) days. 12 capsule 0  ? ?No current facility-administered medications for this visit.  ? ? ? ?Musculoskeletal: ?Strength & Muscle Tone:  N/A ?Gait & Station:  N/A ?Patient leans: N/A ? ?Psychiatric Specialty Exam: ?Review of Systems  ?Psychiatric/Behavioral:  Positive for decreased concentration, dysphoric mood, sleep disturbance and suicidal ideas. Negative for behavioral problems, confusion, hallucinations and self-injury. The patient is nervous/anxious. The patient is not hyperactive.   ?All other systems reviewed and are negative.  ?There were no vitals taken for this visit.There is no height or weight on file to calculate BMI.  ?General Appearance: Fairly Groomed  ?Eye Contact:  Good  ?Speech:  Clear and Coherent  ?Volume:  Normal  ?Mood:  Depressed  ?Affect:  Appropriate, Congruent, and down  ?Thought Process:  Coherent  ?Orientation:  Full (Time, Place, and Person)  ?Thought Content: Logical    ?Suicidal Thoughts:  Yes.  without intent/plan  ?Homicidal Thoughts:  No  ?Memory:  Immediate;   Fair  ?Judgement:  Good  ?Insight:  Good  ?Psychomotor Activity:  Normal  ?Concentration:  Concentration: Fair and

## 2022-02-16 ENCOUNTER — Telehealth: Payer: Self-pay

## 2022-02-16 ENCOUNTER — Other Ambulatory Visit: Payer: Self-pay | Admitting: Psychiatry

## 2022-02-16 ENCOUNTER — Telehealth: Payer: Medicare Other | Admitting: Psychiatry

## 2022-02-16 MED ORDER — FLUOXETINE HCL 20 MG PO CAPS: 20.0000 mg | ORAL_CAPSULE | Freq: Every day | ORAL | 0 refills | Status: DC

## 2022-02-16 NOTE — Telephone Encounter (Signed)
pt called states she needs the new rx for the prozac '20mg'$  because at her last visit you increased.

## 2022-02-16 NOTE — Telephone Encounter (Signed)
Ordered

## 2022-02-23 NOTE — Progress Notes (Deleted)
[facility] MD/PA/NP OP Progress Note  02/23/2022 11:49 AM Miranda Harris  MRN:  [id number]  Chief Complaint: No chief complaint on file.  HPI: *** Visit Diagnosis: No diagnosis found.  Past Psychiatric History: Please see initial evaluation for full details. I have reviewed the history. No updates at this time.     Past Medical History:  Past Medical History:  Diagnosis Date   Bipolar disorder ([place])    Blood transfusion without reported diagnosis    Chronic kidney disease    Depression    Elevated serum creatinine    related to lithium use   Helicobacter pylori (H. pylori) infection    pt denies   Hypothyroidism    Perirectal fistula    Thyroid disease    Trochanteric bursitis of right hip     Past Surgical History:  Procedure Laterality Date   COLONOSCOPY  01/06/2016   at [facility] WITH HEMORRHOIDECTOMY N/A 06/28/2017   Procedure: EXAM UNDER ANESTHESIA WITH EXTERNAL HEMORRHOIDECTOMY AND REPAIR OF PERIRECTAL FISTULA;  Surgeon: Michael Boston, MD;  Location: [facility];  Service: General;  Laterality: N/A;   HERNIA REPAIR  8-25 yrs ago   umbilical hernia   INCISION AND DRAINAGE PERIRECTAL ABSCESS N/A 04/18/2017   Procedure: IRRIGATION AND DEBRIDEMENT PERIRECTAL ABSCESS, placement of seton, sigmoidoscopy;  Surgeon: Excell Seltzer, MD;  [facility];  Service: General;  Laterality: N/A;   LAPAROSCOPIC APPENDECTOMY     POLYPECTOMY     VAGINAL HYSTERECTOMY Bilateral    complete   WISDOM TOOTH EXTRACTION      Family Psychiatric History: Please see initial evaluation for full details. I have reviewed the history. No updates at this time.     Family History:  Family History  Problem Relation Age of Onset   Anxiety disorder Brother    Breast cancer Neg Hx    Colon cancer Neg Hx    Colon polyps Neg Hx    Esophageal cancer Neg Hx    Rectal cancer Neg Hx    Stomach cancer Neg Hx     Social History:  Social History    Socioeconomic History   Marital status: Married    Spouse name: Not on file   Number of children: Not on file   Years of education: Not on file   Highest education level: Not on file  Occupational History   Not on file  Tobacco Use   Smoking status: Former    Packs/day: 1.00    Years: 30.00    Pack years: 30.00    Types: Cigarettes   Smokeless tobacco: Never   Tobacco comments:    quit 2008  Vaping Use   Vaping Use: Never used  Substance and Sexual Activity   Alcohol use: Yes    Comment: once a month per pt   Drug use: No   Sexual activity: Yes    Partners: Male    Birth control/protection: Surgical  Other Topics Concern   Not on file  Social History Narrative   Not on file   Social Determinants of Health   Financial Resource Strain: Not on file  Food Insecurity: Not on file  Transportation Needs: Not on file  Physical Activity: Not on file  Stress: Not on file  Social Connections: Not on file    Allergies:  Allergies  Allergen Reactions   Metrogel [Metronidazole] Other (See Comments)    Burn face   Tetracyclines & Related Rash    Metabolic Disorder  Labs: Lab Results  Component Value Date   HGBA1C 5.5 09/04/2020   MPG 108 06/10/2015   No results found for: PROLACTIN Lab Results  Component Value Date   CHOL 205 (H) 12/25/2020   TRIG 76.0 12/25/2020   HDL 73.30 12/25/2020   CHOLHDL 3 12/25/2020   VLDL 15.2 12/25/2020   LDLCALC 116 (H) 12/25/2020   LDLCALC 122 (H) 09/04/2020   Lab Results  Component Value Date   TSH 0.05 (L) 05/28/2021   TSH 1.32 03/08/2021    Therapeutic Level Labs: Lab Results  Component Value Date   LITHIUM 1.2 11/08/2018   LITHIUM 0.80 06/10/2015   No results found for: VALPROATE No components found for:  CBMZ  Current Medications: Current Outpatient Medications  Medication Sig Dispense Refill   Calcium Carbonate-Vitamin D (CALTRATE 600+D PO) Take 2 tablets by mouth daily.     FLUoxetine (PROZAC) 20 MG capsule  Take 1 capsule (20 mg total) by mouth daily. 90 capsule 0   lamoTRIgine (LAMICTAL) 25 MG tablet Take 2 tablets (50 mg total) by mouth daily. 180 tablet 1   levothyroxine (SYNTHROID) 150 MCG tablet Take 1 tablet (150 mcg total) by mouth daily. 90 tablet 1   levothyroxine (SYNTHROID) 175 MCG tablet TAKE 1 TABLET BY MOUTH EVERY DAY 90 tablet 0   Multiple Vitamin (MULTIVITAMIN WITH MINERALS) TABS tablet Take 1 tablet by mouth daily.     Omega-3 Fatty Acids (FISH OIL) 1000 MG CAPS Take 2,000 mg by mouth daily.     QUEtiapine Fumarate 150 MG TABS Take 150 mg by mouth at bedtime. 90 tablet 0   Vitamin D, Ergocalciferol, (DRISDOL) 1.25 MG (50000 UNIT) CAPS capsule Take 1 capsule (50,000 Units total) by mouth every 7 (seven) days. 12 capsule 0   No current facility-administered medications for this visit.     Musculoskeletal: Strength & Muscle Tone:  N/A Gait & Station:  N/A Patient leans: N/A  Psychiatric Specialty Exam: Review of Systems  There were no vitals taken for this visit.There is no height or weight on file to calculate BMI.  General Appearance: {Appearance:22683}  Eye Contact:  {B[facility] EYE CONTACT:22684}  Speech:  Clear and Coherent  Volume:  Normal  Mood:  {BHH MOOD:22306}  Affect:  {Affect (PAA):22687}  Thought Process:  Coherent  Orientation:  Full (Time, Place, and Person)  Thought Content: Logical   Suicidal Thoughts:  {ST/HT (PAA):22692}  Homicidal Thoughts:  {ST/HT (PAA):22692}  Memory:  Immediate;   Good  Judgement:  {Judgement (PAA):22694}  Insight:  {Insight (PAA):22695}  Psychomotor Activity:  Normal  Concentration:  Concentration: Good and Attention Span: Good  Recall:  Good  Fund of Knowledge: Good  Language: Good  Akathisia:  No  Handed:  Right  AIMS (if indicated): not done  Assets:  Communication Skills Desire for Improvement  ADL's:  Intact  Cognition: WNL  Sleep:  {B[facility] GOOD/FAIR/POOR:22877}   Screenings: PHQ2-9    Flowsheet Row Video Visit from  10/12/2021 in [place] Visit from 07/12/2021 in [place] Video Visit from 02/11/2021 in [place] Video Visit from 11/12/2020 in [place] Office Visit from 12/13/2019 in [place] [facility] [place]  PHQ-2 Total Score 0 4 0 0 0  PHQ-9 Total Score -- 11 -- -- 6      Flowsheet Row Video Visit from 10/12/2021 in [place] Video Visit from 07/21/2021 in [place] Office Visit from [facility]12/2021 in [facility]  C-SSRS RISK CATEGORY No Risk Error: Q3, 4, or 5 should not be populated when Q2 is No High Risk        Assessment and Plan:  Miranda Harris is a 61 y.o. 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       1. Bipolar 1 disorder ([place]) She reports worsening in depressive symptoms over the past few weeks.  Psychosocial stressors include starting a new job (although she wanted this prior to feeling depressed).  Will uptitrate fluoxetine to target depression and anxiety.  Discussed potential risk of medication induced mania.  Noted that this medication change has been made due to her preference given her history of adverse reaction from higher dose of lamotrigine/quetiapine. Will continue current dose of lamotrigine and quetiapine to target bipolar disorder.     2. Insomnia, unspecified type Improving.  Will continue current dose of quetiapine to target insomnia.    This clinician has discussed the side effect associated with medication prescribed during this encounter. Please refer to notes in the previous encounters for more details.     Plan:   Continue lamotrigine 50 mg daily  (dizziness from 100 mg  )  Continue quetiapine 150 mg at night (she self uptitrated, previously had headache, fatigue at higher dose)  Increase fluoxetine 20 mg daily (she will contact the clinic when she needs a refill) Next appointment: 6/2 at 8 AM for 30 mins, video   Past trials of medication: Sertraline, fluoxetine (drowsiness), Lexapro, Duloxetine, venlafaxine (initially worked well, stopped working, hair loss), Wellbutrin (insomnia),  Paxil (urinary retention), Latuda, Abilify (flu like symptoms), Haldol (EPS), Thorazine (EPS), Stelazine,  Lithium, Lunesta (sedation), Ambien (sleepwalking, driving, eating), Trazodone (excessive sedation), Xanax   I have reviewed suicide assessment in detail. No change in the following assessment.    The patient demonstrates the following  risk factors for suicide: Chronic risk factors for suicide include psychiatric disorder /bipolar disorder, history of substance use disorder, family history of suicide attempt. Acute risk factors for suicide include none. Protective factors for this patient include positive social support, responsibility to others (family), coping skills, hope for the future. The overall suicide risk at this point appears to be low, and she is appropriate for outpatient follow up.         Collaboration of Care: Collaboration of Care: {BH OP Collaboration of Care:21014065}  Patient/Guardian was advised Release of Information must be obtained prior to any record release in order to collaborate their care with an outside provider. Patient/Guardian was advised if they have not already done so to contact the registration department to sign all necessary forms in order for [place] to release information regarding their care.   Consent: Patient/Guardian gives verbal consent for treatment and assignment of benefits for services provided during this visit. Patient/Guardian expressed understanding and agreed to proceed.    [name], MD 02/23/2022, 11:49 AM

## 2022-02-25 ENCOUNTER — Telehealth: Payer: Medicare Other | Admitting: Psychiatry

## 2022-03-01 NOTE — Progress Notes (Unsigned)
Virtual Visit via Video Note  I connected with Miranda Harris on 03/02/22 at 11:30 AM EDT by a video enabled telemedicine application and verified that I am speaking with the correct person using two identifiers.  Location: Patient: home Provider: office Persons participated in the visit- patient, provider    I discussed the limitations of evaluation and management by telemedicine and the availability of in person appointments. The patient expressed understanding and agreed to proceed.  I discussed the assessment and treatment plan with the patient. The patient was provided an opportunity to ask questions and all were answered. The patient agreed with the plan and demonstrated an understanding of the instructions.   The patient was advised to call back or seek an in-person evaluation if the symptoms worsen or if the condition fails to improve as anticipated.  I provided 15 minutes of non-face-to-face time during this encounter.   [name], MD    [facility] MD/PA/NP OP Progress Note  03/02/2022 11:58 AM Miranda Harris  MRN:  [id number]  Chief Complaint:  Chief Complaint  Patient presents with   Follow-up   Other   HPI:  This is a follow-up appointment for bipolar disorder and insomnia.  She states that she has been doing better.  She had to quit job at [employer] and she was not able to focus well.  She states that she tends to have this issues when she feels depressed.  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  She wants to be around with others, and she wants to get out from the house during the day.  She thinks she will be doing good her focus is better.  She feels less depressed. She feels fatigue later in the day, and she thinks it has worsened since higher dose of fluoxetine. She sleeps well.  She denies anxiety.  Although she thought she has gained weight, it has not changed as much for the past several months.   She denies change in appetite.  She denies SI.  She denies decreased need for sleep or euphonia.  She is hoping to working on exercise as she has been lazy for the past month.      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 Bipolar 1 disorder (HCC)  F31.9     2. Insomnia, unspecified type  G47.00       Past Psychiatric History: Please see initial evaluation for full details. I have reviewed the history. No updates at this time.     Past Medical History:  Past Medical History:  Diagnosis Date   Bipolar disorder ([place])    Blood transfusion without reported diagnosis    Chronic kidney disease    Depression    Elevated serum creatinine    related to lithium use   Helicobacter pylori (H. pylori) infection    pt denies   Hypothyroidism    Perirectal fistula    Thyroid disease    Trochanteric bursitis of right hip     Past Surgical History:  Procedure Laterality Date  COLONOSCOPY  01/06/2016   at [place] WITH HEMORRHOIDECTOMY N/A 06/28/2017   Procedure: EXAM UNDER ANESTHESIA WITH EXTERNAL HEMORRHOIDECTOMY AND REPAIR OF PERIRECTAL FISTULA;  Surgeon: Michael Boston, MD;  Location: [facility];  Service: General;  Laterality: N/A;   HERNIA REPAIR  0-17 yrs ago   umbilical hernia   INCISION AND DRAINAGE PERIRECTAL ABSCESS N/A 04/18/2017   Procedure: IRRIGATION AND DEBRIDEMENT PERIRECTAL ABSCESS, placement of seton, sigmoidoscopy;  Surgeon: Excell Seltzer, MD;  Location: [facility];  Service: General;  Laterality: N/A;   LAPAROSCOPIC APPENDECTOMY     POLYPECTOMY     VAGINAL HYSTERECTOMY Bilateral    complete   WISDOM  TOOTH EXTRACTION      Family Psychiatric History: Please see initial evaluation for full details. I have reviewed the history. No updates at this time.     Family History:  Family History  Problem Relation Age of Onset   Anxiety disorder Brother    Breast cancer Neg Hx    Colon cancer Neg Hx    Colon polyps Neg Hx    Esophageal cancer Neg Hx    Rectal cancer Neg Hx    Stomach cancer Neg Hx     Social History:  Social History   Socioeconomic History   Marital status: Married    Spouse name: Not on file   Number of children: Not on file   Years of education: Not on file   Highest education level: Not on file  Occupational History   Not on file  Tobacco Use   Smoking status: Former    Packs/day: 1.00    Years: 30.00    Pack years: 30.00    Types: Cigarettes   Smokeless tobacco: Never   Tobacco comments:    quit 2008  Vaping Use   Vaping Use: Never used  Substance and Sexual Activity   Alcohol use: Yes    Comment: once a month per pt   Drug use: No   Sexual activity: Yes    Partners: Male    Birth control/protection: Surgical  Other Topics Concern   Not on file  Social History Narrative   Not on file   Social Determinants of Health   Financial Resource Strain: Not on file  Food Insecurity: Not on file  Transportation Needs: Not on file  Physical Activity: Not on file  Stress: Not on file  Social Connections: Not on file    Allergies:  Allergies  Allergen Reactions   Metrogel [Metronidazole] Other (See Comments)    Burn face   Tetracyclines & Related Rash    Metabolic Disorder Labs: Lab Results  Component Value Date   HGBA1C 5.5 09/04/2020   MPG 108 06/10/2015   No results found for: PROLACTIN Lab Results  Component Value Date   CHOL 205 (H) 12/25/2020   TRIG 76.0 12/25/2020   HDL 73.30 12/25/2020   CHOLHDL 3 12/25/2020   VLDL 15.2 12/25/2020   LDLCALC 116 (H) 12/25/2020   LDLCALC 122 (H) 09/04/2020   Lab Results  Component Value Date    TSH 0.05 (L) 05/28/2021   TSH 1.32 03/08/2021    Therapeutic Level Labs: Lab Results  Component Value Date   LITHIUM 1.2 11/08/2018   LITHIUM 0.80 06/10/2015   No results found for: VALPROATE No components found for:  CBMZ  Current Medications: Current Outpatient Medications  Medication Sig Dispense Refill   Calcium Carbonate-Vitamin D (CALTRATE 600+D PO) Take 2 tablets by mouth daily.  FLUoxetine (PROZAC) 20 MG capsule Take 1 capsule (20 mg total) by mouth daily. 90 capsule 0   lamoTRIgine (LAMICTAL) 25 MG tablet Take 2 tablets (50 mg total) by mouth daily. 180 tablet 1   levothyroxine (SYNTHROID) 150 MCG tablet Take 1 tablet (150 mcg total) by mouth daily. 90 tablet 1   levothyroxine (SYNTHROID) 175 MCG tablet TAKE 1 TABLET BY MOUTH EVERY DAY 90 tablet 0   Multiple Vitamin (MULTIVITAMIN WITH MINERALS) TABS tablet Take 1 tablet by mouth daily.     Omega-3 Fatty Acids (FISH OIL) 1000 MG CAPS Take 2,000 mg by mouth daily.     QUEtiapine Fumarate 150 MG TABS Take 150 mg by mouth at bedtime. 90 tablet 0   Vitamin D, Ergocalciferol, (DRISDOL) 1.25 MG (50000 UNIT) CAPS capsule Take 1 capsule (50,000 Units total) by mouth every 7 (seven) days. 12 capsule 0   No current facility-administered medications for this visit.     Musculoskeletal: Strength & Muscle Tone:  N/A Gait & Station:  N/A Patient leans: N/A  Psychiatric Specialty Exam: Review of Systems  Psychiatric/Behavioral:  Positive for decreased concentration. Negative for agitation, behavioral problems, confusion, dysphoric mood, hallucinations, self-injury, sleep disturbance and suicidal ideas. The patient is not nervous/anxious and is not hyperactive.   All other systems reviewed and are negative.  There were no vitals taken for this visit.There is no height or weight on file to calculate BMI.  General Appearance: Fairly Groomed  Eye Contact:  Good  Speech:  Clear and Coherent  Volume:  Normal  Mood:   better   Affect:  Appropriate, Congruent, and euthymic  Thought Process:  Coherent  Orientation:  Full (Time, Place, and Person)  Thought Content: Logical   Suicidal Thoughts:  No  Homicidal Thoughts:  No  Memory:  Immediate;   Good  Judgement:  Good  Insight:  Good  Psychomotor Activity:  Normal  Concentration:  Concentration: Good and Attention Span: Good  Recall:  Good  Fund of Knowledge: Good  Language: Good  Akathisia:  No  Handed:  Right  AIMS (if indicated): not done  Assets:  Communication Skills Desire for Improvement  ADL's:  Intact  Cognition: WNL  Sleep:  Good   Screenings: PHQ2-9    Flowsheet Row Video Visit from 10/12/2021 in [facility] Visit from 07/12/2021 in [facility] Video Visit from 02/11/2021 in [facility] Video Visit from 11/12/2020 in [facility] from 12/13/2019 in [facility] at [place]  PHQ-2 Total Score 0 4 0 0 0  PHQ-9 Total Score -- 11 -- -- 6      Flowsheet Row Video Visit from 10/12/2021 in [facility] Video Visit from 07/21/2021 in [facility] Office Visit from 07/12/2021 in [facility] No Risk Error: Q3, 4, or 5 should not be populated when Q2 is No High Risk        Assessment and Plan:  Miranda Harris is a 61 y.o. 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   1. Bipolar 1 disorder ([place]) There has been improvement in depressive symptoms since uptitration of fluoxetine.  Will continue current medication regimen.  Will continue lamotrigine, quetiapine for bipolar depression.  Will continue fluoxetine for depression and anxiety.   Noted that  she reports slight worsening in fatigue later in the day since uptitration of fluoxetine; she agrees to try taking the medication in the morning to see if it works.   2. Insomnia, unspecified type Improving.  Will continue current dose of quetiapine to target insomnia.    This clinician has discussed the side effect associated with medication prescribed during this encounter. Please refer to notes in the previous encounters for more details.     Plan:  (she will contact the clinic if she needs a refill) Continue lamotrigine 50 mg daily  (dizziness from 100 mg )  Continue quetiapine 150 mg at night  Continue fluoxetine 20 mg daily - monitor fatigue Next appointment: 7/19 at 1:30 for 30 mins, video   Past trials of medication: Sertraline, fluoxetine (drowsiness), Lexapro, Duloxetine, venlafaxine (initially worked well, stopped working, hair loss), Wellbutrin (insomnia),  Paxil (urinary retention), Latuda, Abilify (flu like symptoms), Haldol (EPS), Thorazine (EPS), Stelazine,  Lithium, Lunesta (sedation), Ambien (sleepwalking, driving, eating), Trazodone (excessive sedation), Xanax   I have reviewed suicide assessment in detail. No change in the following assessment.    The patient demonstrates the following  risk factors for suicide: Chronic risk factors for suicide include psychiatric disorder /bipolar disorder, history of substance use disorder, family history of suicide attempt. Acute risk factors for suicide include none. Protective factors for this patient include positive social support, responsibility to others (family), coping skills, hope for the future. The overall suicide risk at this point appears to be low, and she is appropriate for outpatient follow up.        Collaboration of Care: Collaboration of Care: Other N/A  Patient/Guardian was advised Release of Information must be obtained prior to any record release in order to collaborate their care with an outside  provider. Patient/Guardian was advised if they have not already done so to contact the registration department to sign all necessary forms in order for [place] to release information regarding their care.   Consent: Patient/Guardian gives verbal consent for treatment and assignment of benefits for services provided during this visit. Patient/Guardian expressed understanding and agreed to proceed.    [name], MD 03/02/2022, 11:59 AM

## 2022-03-02 ENCOUNTER — Telehealth (INDEPENDENT_AMBULATORY_CARE_PROVIDER_SITE_OTHER): Payer: 59 | Admitting: Psychiatry

## 2022-03-02 ENCOUNTER — Encounter: Payer: Self-pay | Admitting: Psychiatry

## 2022-03-02 DIAGNOSIS — G4700 Insomnia, unspecified: Secondary | ICD-10-CM

## 2022-03-02 DIAGNOSIS — F319 Bipolar disorder, unspecified: Secondary | ICD-10-CM

## 2022-03-02 NOTE — Patient Instructions (Signed)
Continue lamotrigine 50 mg daily  Continue quetiapine 150 mg at night  Continue fluoxetine 20 mg daily  Next appointment: 7/19 at 1:30

## 2022-03-04 ENCOUNTER — Telehealth: Payer: Medicare Other | Admitting: Psychiatry

## 2022-03-17 ENCOUNTER — Telehealth: Payer: Self-pay | Admitting: Psychiatry

## 2022-03-17 ENCOUNTER — Other Ambulatory Visit: Payer: Self-pay | Admitting: Psychiatry

## 2022-03-17 MED ORDER — QUETIAPINE FUMARATE 150 MG PO TABS: 150.0000 mg | ORAL_TABLET | Freq: Every day | ORAL | 0 refills | Status: DC

## 2022-03-17 NOTE — Telephone Encounter (Signed)
Medication ordered. (She filled this medication a few weeks sooner than expected. Will discuss with her at the next visit.)

## 2022-03-17 NOTE — Telephone Encounter (Signed)
Patient called and needs refill on Seroquel. It was last filled on 12-17-21 but next appt. Isn't until 04-13-22.

## 2022-04-11 NOTE — Progress Notes (Unsigned)
Virtual Visit via Video Note  I connected with Miranda Harris on 04/13/22 at  1:30 PM EDT by a video enabled telemedicine application and verified that I am speaking with the correct person using two identifiers.  Location: Patient: home Provider: office Persons participated in the visit- patient, provider    I discussed the limitations of evaluation and management by telemedicine and the availability of in person appointments. The patient expressed understanding and agreed to proceed.   I discussed the assessment and treatment plan with the patient. The patient was provided an opportunity to ask questions and all were answered. The patient agreed with the plan and demonstrated an understanding of the instructions.   The patient was advised to call back or seek an in-person evaluation if the symptoms worsen or if the condition fails to improve as anticipated.  I provided 21 minutes of non-face-to-face time during this encounter.   [name], MD    [facility] MD/PA/NP OP Progress Note  04/13/2022 2:00 PM Miranda Harris  MRN:  [id number]  Chief Complaint:  Chief Complaint  Patient presents with   Follow-up   Other   HPI:  This is a follow-up appointment for bipolar disorder and insomnia.  She states that she has been doing well.  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  She works 25 hours a week.  She likes people she works with as well as the customers.  She states that she is sociable, and she likes being outside.  She works on the yard with her husband when she does not work.  She thinks her mood is better.  Although she continues to feel drowsy, it has been manageable by drinking coffee.  She has being able to keep up with work despite that she needs to remember lots of things.  She eats healthy.  She reports weight gain over the past few months, despite no change in her appetite.  She denies SI.  She sleeps 8 hours.  She denies decreased need for sleep or euphonia.   She feels comfortable to stay on the current medication regimen.    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 Bipolar 1 disorder (HCC)  F31.9     2. Insomnia, unspecified type  G47.00       Past Psychiatric History: Please see initial evaluation for full details. I have reviewed the history. No updates at this time.     Past Medical History:  Past Medical History:  Diagnosis Date   Bipolar disorder ([place])    Blood transfusion without reported diagnosis    Chronic kidney disease    Depression    Elevated serum creatinine    related to lithium use   Helicobacter pylori (H. pylori) infection    pt denies   Hypothyroidism    Perirectal fistula    Thyroid disease    Trochanteric bursitis of right hip     Past Surgical History:  Procedure Laterality Date   COLONOSCOPY  01/06/2016   at [facility] WITH HEMORRHOIDECTOMY N/A  06/28/2017   Procedure: EXAM UNDER ANESTHESIA WITH EXTERNAL HEMORRHOIDECTOMY AND REPAIR OF PERIRECTAL FISTULA;  Surgeon: Michael Boston, MD;  Location: [facility];  Service: General;  Laterality: N/A;   HERNIA REPAIR  6-81 yrs ago   umbilical hernia   INCISION AND DRAINAGE PERIRECTAL ABSCESS N/A 04/18/2017   Procedure: IRRIGATION AND DEBRIDEMENT PERIRECTAL ABSCESS, placement of seton, sigmoidoscopy;  Surgeon: Excell Seltzer, MD;  Location[facility];  Service: General;  Laterality: N/A;   LAPAROSCOPIC APPENDECTOMY     POLYPECTOMY     VAGINAL HYSTERECTOMY Bilateral    complete   WISDOM TOOTH EXTRACTION      Family Psychiatric History: Please see initial evaluation for full  details. I have reviewed the history. No updates at this time.     Family History:  Family History  Problem Relation Age of Onset   Anxiety disorder Brother    Breast cancer Neg Hx    Colon cancer Neg Hx    Colon polyps Neg Hx    Esophageal cancer Neg Hx    Rectal cancer Neg Hx    Stomach cancer Neg Hx     Social History:  Social History   Socioeconomic History   Marital status: Married    Spouse name: Not on file   Number of children: Not on file   Years of education: Not on file   Highest education level: Not on file  Occupational History   Not on file  Tobacco Use   Smoking status: Former    Packs/day: 1.00    Years: 30.00    Total pack years: 30.00    Types: Cigarettes   Smokeless tobacco: Never   Tobacco comments:    quit 2008  Vaping Use   Vaping Use: Never used  Substance and Sexual Activity   Alcohol use: Yes    Comment: once a month per pt   Drug use: No   Sexual activity: Yes    Partners: Male    Birth control/protection: Surgical  Other Topics Concern   Not on file  Social History Narrative   Not on file   Social Determinants of Health   Financial Resource Strain: Not on file  Food Insecurity: Not on file  Transportation Needs: Not on file  Physical Activity: Not on file  Stress: Not on file  Social Connections: Not on file    Allergies:  Allergies  Allergen Reactions   Metrogel [Metronidazole] Other (See Comments)    Burn face   Tetracyclines & Related Rash    Metabolic Disorder Labs: Lab Results  Component Value Date   HGBA1C 5.5 09/04/2020   MPG 108 06/10/2015   No results found for: "PROLACTIN" Lab Results  Component Value Date   CHOL 205 (H) 12/25/2020   TRIG 76.0 12/25/2020   HDL 73.30 12/25/2020   CHOLHDL 3 12/25/2020   VLDL 15.2 12/25/2020   LDLCALC 116 (H) 12/25/2020   LDLCALC 122 (H) 09/04/2020   Lab Results  Component Value Date   TSH 0.05 (L) 05/28/2021   TSH 1.32 03/08/2021    Therapeutic Level  Labs: Lab Results  Component Value Date   LITHIUM 1.2 11/08/2018   LITHIUM 0.80 06/10/2015   No results found for: "VALPROATE" No results found for: "CBMZ"  Current Medications: Current Outpatient Medications  Medication Sig Dispense Refill   Calcium Carbonate-Vitamin D (CALTRATE 600+D PO) Take 2 tablets by mouth daily.     [START ON 05/20/2022] FLUoxetine (PROZAC) 20 MG capsule Take 1 capsule (20  mg total) by mouth daily. 90 capsule 0   lamoTRIgine (LAMICTAL) 25 MG tablet Take 2 tablets (50 mg total) by mouth daily. 180 tablet 1   levothyroxine (SYNTHROID) 150 MCG tablet Take 1 tablet (150 mcg total) by mouth daily. 90 tablet 1   levothyroxine (SYNTHROID) 175 MCG tablet TAKE 1 TABLET BY MOUTH EVERY DAY 90 tablet 0   Multiple Vitamin (MULTIVITAMIN WITH MINERALS) TABS tablet Take 1 tablet by mouth daily.     Omega-3 Fatty Acids (FISH OIL) 1000 MG CAPS Take 2,000 mg by mouth daily.     QUEtiapine Fumarate 150 MG TABS Take 150 mg by mouth at bedtime. 90 tablet 0   Vitamin D, Ergocalciferol, (DRISDOL) 1.25 MG (50000 UNIT) CAPS capsule Take 1 capsule (50,000 Units total) by mouth every 7 (seven) days. 12 capsule 0   No current facility-administered medications for this visit.     Musculoskeletal: Strength & Muscle Tone:  N/A Gait & Station:  N/A Patient leans: N/A  Psychiatric Specialty Exam: Review of Systems  Psychiatric/Behavioral:  Negative for agitation, behavioral problems, confusion, decreased concentration, dysphoric mood, hallucinations, self-injury, sleep disturbance and suicidal ideas. The patient is not nervous/anxious and is not hyperactive.   All other systems reviewed and are negative.   There were no vitals taken for this visit.There is no height or weight on file to calculate BMI.  General Appearance: Fairly Groomed  Eye Contact:  Good  Speech:  Clear and Coherent  Volume:  Normal  Mood:   good  Affect:  Appropriate, Congruent, and calm  Thought Process:   Coherent  Orientation:  Full (Time, Place, and Person)  Thought Content: Logical   Suicidal Thoughts:  No  Homicidal Thoughts:  No  Memory:  Immediate;   Good  Judgement:  Good  Insight:  Good  Psychomotor Activity:  Normal  Concentration:  Concentration: Good and Attention Span: Good  Recall:  Good  Fund of Knowledge: Good  Language: Good  Akathisia:  No  Handed:  Right  AIMS (if indicated): not done  Assets:  Communication Skills Desire for Improvement  ADL's:  Intact  Cognition: WNL  Sleep:  Good   Screenings: PHQ2-9    Flowsheet Row Video Visit from 10/12/2021 in [facility] Visit from 07/12/2021 in [facility] Video Visit from 02/11/2021 in [facility] Video Visit from 11/12/2020 in [facility] from 12/13/2019 in [facility] at [place]  PHQ-2 Total Score 0 4 0 0 0  PHQ-9 Total Score -- 11 -- -- 6      Flowsheet Row Video Visit from 10/12/2021 in [facility] Video Visit from 07/21/2021 in [facility] Office Visit from 07/12/2021 in [facility] No Risk Error: Q3, 4, or 5 should not be populated when Q2 is No High Risk        Assessment and Plan:  Dailee Manalang is a 61 y.o. 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   1. Bipolar 1 disorder ([place]) There has been steady improvement in depressive symptoms since the last visit.  She enjoys her job, and working on the yard work.  Will continue lamotrigine, quetiapine for bipolar depression.  Will continue fluoxetine for depression and anxiety.  Noted that although she reports weight gain  which may be  associated with quetiapine, she has history of hypoglycemia.  Metformin would not be the best option, and neither topiramate due to her feeling fatigue at baseline.  She agrees to continue the current dose of quetiapine at this time and monitor her weight.   2. Insomnia, unspecified type Improving.  Will continue current dose of quetiapine to target insomnia.      Plan:   Continue lamotrigine 50 mg daily  (dizziness from 100 mg )  Continue quetiapine 150 mg at night  Continue fluoxetine 20 mg daily - monitor fatigue Next appointment: 9/13 at 9:30  for 30 mins, video   Past trials of medication: Sertraline, fluoxetine (drowsiness), Lexapro, Duloxetine, venlafaxine (initially worked well, stopped working, hair loss), Wellbutrin (insomnia),  Paxil (urinary retention), Latuda, Abilify (flu like symptoms), Haldol (EPS), Thorazine (EPS), Stelazine,  Lithium, Lunesta (sedation), Ambien (sleepwalking, driving, eating), Trazodone (excessive sedation), Xanax    The patient demonstrates the following  risk factors for suicide: Chronic risk factors for suicide include psychiatric disorder /bipolar disorder, history of substance use disorder, family history of suicide attempt. Acute risk factors for suicide include none. Protective factors for this patient include positive social support, responsibility to others (family), coping skills, hope for the future. The overall suicide risk at this point appears to be low, and she is appropriate for outpatient follow up.    This clinician has discussed the side effect associated with medication prescribed during this encounter. Please refer to notes in the previous encounters for more details.     Collaboration of Care: Collaboration of Care: Other N/A  Patient/Guardian was advised Release of Information must be obtained prior to any record release in order to collaborate their care with an outside provider. Patient/Guardian was advised if they have not already done  so to contact the registration department to sign all necessary forms in order for [place] to release information regarding their care.   Consent: Patient/Guardian gives verbal consent for treatment and assignment of benefits for services provided during this visit. Patient/Guardian expressed understanding and agreed to proceed.    [name], MD 04/13/2022, 2:00 PM

## 2022-04-13 ENCOUNTER — Telehealth (INDEPENDENT_AMBULATORY_CARE_PROVIDER_SITE_OTHER): Payer: 59 | Admitting: Psychiatry

## 2022-04-13 ENCOUNTER — Encounter: Payer: Self-pay | Admitting: Psychiatry

## 2022-04-13 DIAGNOSIS — F319 Bipolar disorder, unspecified: Secondary | ICD-10-CM

## 2022-04-13 DIAGNOSIS — G4700 Insomnia, unspecified: Secondary | ICD-10-CM | POA: Diagnosis not present

## 2022-04-13 MED ORDER — FLUOXETINE HCL 20 MG PO CAPS: 20.0000 mg | ORAL_CAPSULE | Freq: Every day | ORAL | 0 refills | Status: DC

## 2022-04-13 NOTE — Patient Instructions (Signed)
Continue lamotrigine 50 mg daily   Continue quetiapine 150 mg at night  Continue fluoxetine 20 mg daily  Next appointment: 9/13 at 9:30

## 2022-06-06 NOTE — Progress Notes (Unsigned)
Virtual Visit via Video Note  I connected with Miranda Harris on 06/08/22 at  9:30 AM EDT by a video enabled telemedicine application and verified that I am speaking with the correct person using two identifiers.  Location: Patient: home Provider: office Persons participated in the visit- patient, provider    I discussed the limitations of evaluation and management by telemedicine and the availability of in person appointments. The patient expressed understanding and agreed to proceed.     I discussed the assessment and treatment plan with the patient. The patient was provided an opportunity to ask questions and all were answered. The patient agreed with the plan and demonstrated an understanding of the instructions.   The patient was advised to call back or seek an in-person evaluation if the symptoms worsen or if the condition fails to improve as anticipated.  I provided 15 minutes of non-face-to-face time during this encounter.   [name], MD    [facility] MD/PA/NP OP Progress Note  06/08/2022 10:03 AM Miranda Harris:  [id number]  Chief Complaint:  Chief Complaint  Patient presents with   Other   HPI:  This is a follow-up for bipolar disorder and insomnia.  She states that she has been doing well.  She continues to work at [employer].  She thinks it is the best part that she can communicate/chitchat with others at work.  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  She has been working on backyard.  She is planning to work on the floors.  She denies feeling depressed, and finds the current work to be very helpful.  She sleeps 7 hours.  She feels good about weight loss since being more active.  She has good energy during the day.  She denies SI.  She denies decreased need for sleep or euphonia.  She feels comfortable to stay on the current medication regimen.   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 Bipolar 1 disorder (HCC)  F31.9     2. Insomnia, unspecified type  G47.00       Past Psychiatric History: Please see initial evaluation for full details. I have reviewed the history. No updates at this time.     Past Medical History:  Past Medical History:  Diagnosis Date   Bipolar disorder ([place])    Blood transfusion without reported diagnosis    Chronic kidney disease    Depression    Elevated serum creatinine    related to lithium use   Helicobacter pylori (H. pylori) infection    pt denies   Hypothyroidism    Perirectal fistula    Thyroid disease    Trochanteric bursitis of right hip     Past Surgical History:  Procedure Laterality Date   COLONOSCOPY  01/06/2016   at [place] WITH HEMORRHOIDECTOMY N/A 06/28/2017   Procedure: EXAM UNDER ANESTHESIA WITH EXTERNAL HEMORRHOIDECTOMY AND REPAIR OF PERIRECTAL FISTULA;  Surgeon: Michael Boston, MD;  Location: [place];  Service: General;  Laterality: N/A;   HERNIA  REPAIR  6-27 yrs ago   umbilical hernia   INCISION AND DRAINAGE PERIRECTAL ABSCESS N/A 04/18/2017   Procedure: IRRIGATION AND DEBRIDEMENT PERIRECTAL ABSCESS, placement of seton, sigmoidoscopy;  Surgeon: Excell Seltzer, MD;  Location: [facility];  Service: General;  Laterality: N/A;   LAPAROSCOPIC APPENDECTOMY     POLYPECTOMY     VAGINAL HYSTERECTOMY Bilateral    complete   WISDOM TOOTH EXTRACTION      Family Psychiatric History: Please see initial evaluation for full details. I have reviewed the history. No updates at this time.     Family History:  Family History  Problem Relation Age of Onset   Anxiety disorder Brother     Breast cancer Neg Hx    Colon cancer Neg Hx    Colon polyps Neg Hx    Esophageal cancer Neg Hx    Rectal cancer Neg Hx    Stomach cancer Neg Hx     Social History:  Social History   Socioeconomic History   Marital status: Married    Spouse name: Not on file   Number of children: Not on file   Years of education: Not on file   Highest education level: Not on file  Occupational History   Not on file  Tobacco Use   Smoking status: Former    Packs/day: 1.00    Years: 30.00    Total pack years: 30.00    Types: Cigarettes   Smokeless tobacco: Never   Tobacco comments:    quit 2008  Vaping Use   Vaping Use: Never used  Substance and Sexual Activity   Alcohol use: Yes    Comment: once a month per pt   Drug use: No   Sexual activity: Yes    Partners: Male    Birth control/protection: Surgical  Other Topics Concern   Not on file  Social History Narrative   Not on file   Social Determinants of Health   Financial Resource Strain: Not on file  Food Insecurity: Not on file  Transportation Needs: Not on file  Physical Activity: Not on file  Stress: Not on file  Social Connections: Not on file    Allergies:  Allergies  Allergen Reactions   Metrogel [Metronidazole] Other (See Comments)    Burn face   Tetracyclines & Related Rash    Metabolic Disorder Labs: Lab Results  Component Value Date   HGBA1C 5.5 09/04/2020   MPG 108 06/10/2015   No results found for: "PROLACTIN" Lab Results  Component Value Date   CHOL 205 (H) 12/25/2020   TRIG 76.0 12/25/2020   HDL 73.30 12/25/2020   CHOLHDL 3 12/25/2020   VLDL 15.2 12/25/2020   LDLCALC 116 (H) 12/25/2020   LDLCALC 122 (H) 09/04/2020   Lab Results  Component Value Date   TSH 0.05 (L) 05/28/2021   TSH 1.32 03/08/2021    Therapeutic Level Labs: Lab Results  Component Value Date   LITHIUM 1.2 11/08/2018   LITHIUM 0.80 06/10/2015   No results found for: "VALPROATE" No results found for: "CBMZ"  Current  Medications: Current Outpatient Medications  Medication Sig Dispense Refill   Calcium Carbonate-Vitamin D (CALTRATE 600+D PO) Take 2 tablets by mouth daily.     FLUoxetine (PROZAC) 20 MG capsule Take 1 capsule (20 mg total) by mouth daily. 90 capsule 0   lamoTRIgine (LAMICTAL) 25 MG tablet Take 2 tablets (50 mg total) by mouth daily. 180 tablet 1   levothyroxine (SYNTHROID) 150 MCG tablet Take 1 tablet (150  mcg total) by mouth daily. 90 tablet 1   levothyroxine (SYNTHROID) 175 MCG tablet TAKE 1 TABLET BY MOUTH EVERY DAY 90 tablet 0   Multiple Vitamin (MULTIVITAMIN WITH MINERALS) TABS tablet Take 1 tablet by mouth daily.     Omega-3 Fatty Acids (FISH OIL) 1000 MG CAPS Take 2,000 mg by mouth daily.     [START ON 06/16/2022] QUEtiapine Fumarate 150 MG TABS Take 150 mg by mouth at bedtime. 90 tablet 0   Vitamin D, Ergocalciferol, (DRISDOL) 1.25 MG (50000 UNIT) CAPS capsule Take 1 capsule (50,000 Units total) by mouth every 7 (seven) days. 12 capsule 0   No current facility-administered medications for this visit.     Musculoskeletal: Strength & Muscle Tone:  N/A Gait & Station:  N/A Patient leans: N/A  Psychiatric Specialty Exam: Review of Systems  Psychiatric/Behavioral:  Negative for agitation, behavioral problems, confusion, decreased concentration, dysphoric mood, hallucinations, self-injury, sleep disturbance and suicidal ideas. The patient is not nervous/anxious and is not hyperactive.   All other systems reviewed and are negative.   There were no vitals taken for this visit.There is no height or weight on file to calculate BMI.  General Appearance: Fairly Groomed  Eye Contact:  Good  Speech:  Clear and Coherent  Volume:  Normal  Mood:   good  Affect:  Appropriate, Congruent, and calm  Thought Process:  Coherent  Orientation:  Full (Time, Place, and Person)  Thought Content: Logical   Suicidal Thoughts:  No  Homicidal Thoughts:  No  Memory:  Immediate;   Good  Judgement:   Good  Insight:  Good  Psychomotor Activity:  Normal  Concentration:  Concentration: Good and Attention Span: Good  Recall:  Good  Fund of Knowledge: Good  Language: Good  Akathisia:  No  Handed:  Right  AIMS (if indicated): not done  Assets:  Communication Skills Desire for Improvement  ADL's:  Intact  Cognition: WNL  Sleep:  Good   Screenings: PHQ2-9    Flowsheet Row Video Visit from 10/12/2021 in [place] Visit from 07/12/2021 in [place] Video Visit from 02/11/2021 in [place] Video Visit from 11/12/2020 in [place] from 12/13/2019 in [place] [facility] [place]  PHQ-2 Total Score 0 4 0 0 0  PHQ-9 Total Score -- 11 -- -- 6      Flowsheet Row Video Visit from 10/12/2021 in [place] Video Visit from 07/21/2021 in [place] Office Visit from 07/12/2021 in [place] No Risk Error: Q3, 4, or 5 should not be populated when Q2 is No High Risk        Assessment and Plan:  Daiya Tamer is a 61 y.o. 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    1. Bipolar 1 disorder ([place]) There has been a steady improvement in depressive symptoms since the last visit. She enjoys her job, and working on the yard work.  Will continue lamotrigine, quetiapine, and fluoxetine to target bipolar depression.  Noted that she reports weight loss since being active; will continue to monitor this.   2. Insomnia, unspecified type Improving.  Will continue current dose of quetiapine to target insomnia.     Plan:   Continue lamotrigine 50 mg daily  (dizziness from  100 mg ) -  she declined a refill Continue quetiapine 150 mg at night  Continue fluoxetine 20 mg daily - monitor fatigue- filled on 9/5 Next appointment: 12/1 at 9 AM for 30 mins, video   Past trials of medication: Sertraline, fluoxetine (drowsiness), Lexapro, Duloxetine, venlafaxine (initially worked well, stopped working, hair loss), Wellbutrin (insomnia),  Paxil (urinary retention), Latuda, Abilify (flu like symptoms), Haldol (EPS), Thorazine (EPS), Stelazine,  Lithium, Lunesta (sedation), Ambien (sleepwalking, driving, eating), Trazodone (excessive sedation), Xanax    The patient demonstrates the following  risk factors for suicide: Chronic risk factors for suicide include psychiatric disorder /bipolar disorder, history of substance use disorder, family history of suicide attempt. Acute risk factors for suicide include none. Protective factors for this patient include positive social support, responsibility to others (family), coping skills, hope for the future. The overall suicide risk at this point appears to be low, and she is appropriate for outpatient follow up.      Collaboration of Care: Collaboration of Care: Other N/A  Patient/Guardian was advised Release of Information must be obtained prior to any record release in order to collaborate their care with an outside provider. Patient/Guardian was advised if they have not already done so to contact the registration department to sign all necessary forms in order for [place] to release information regarding their care.   Consent: Patient/Guardian gives verbal consent for treatment and assignment of benefits for services provided during this visit. Patient/Guardian expressed understanding and agreed to proceed.    [name], MD 06/08/2022, 10:03 AM

## 2022-06-08 ENCOUNTER — Encounter: Payer: Self-pay | Admitting: Psychiatry

## 2022-06-08 ENCOUNTER — Telehealth (INDEPENDENT_AMBULATORY_CARE_PROVIDER_SITE_OTHER): Payer: 59 | Admitting: Psychiatry

## 2022-06-08 DIAGNOSIS — G4700 Insomnia, unspecified: Secondary | ICD-10-CM | POA: Diagnosis not present

## 2022-06-08 DIAGNOSIS — F319 Bipolar disorder, unspecified: Secondary | ICD-10-CM | POA: Diagnosis not present

## 2022-06-08 MED ORDER — QUETIAPINE FUMARATE 150 MG PO TABS
150.0000 mg | ORAL_TABLET | Freq: Every day | ORAL | 0 refills | Status: DC
Start: 2022-06-16 — End: 2022-06-23

## 2022-06-12 ENCOUNTER — Other Ambulatory Visit: Payer: Self-pay | Admitting: Psychiatry

## 2022-06-13 ENCOUNTER — Telehealth: Payer: Self-pay

## 2022-06-13 NOTE — Telephone Encounter (Signed)
Pt called on 06-13-22 @ 8:19 and 8:31 left message that Dr, Modesta Messing did not send in her quetiapine and that she hasn't taken in the last 3 days.

## 2022-06-13 NOTE — Telephone Encounter (Signed)
Tc on 06-13-22 @ 9:33 left message that the quetiapine was sent in on [facility] @ 9[facility] [place] and that she will need to contact the pharmacy and find out why they have not contacted her about medication   QUEtiapine Fumarate 150 MG TABS 90 tablet 0 06/16/2022 09/14/2022   Sig - Route: Take 150 mg by mouth at bedtime. - Oral   Sent to pharmacy as: QUEtiapine Fumarate 150 MG Tab   E-Prescribing Status: Receipt confirmed by pharmacy (06/08/2022  9:55 AM EDT)

## 2022-06-15 ENCOUNTER — Other Ambulatory Visit: Payer: Self-pay | Admitting: Psychiatry

## 2022-06-22 ENCOUNTER — Telehealth: Payer: Self-pay

## 2022-06-22 NOTE — Telephone Encounter (Signed)
pt left a message that the pharmacy states that the quetiapine is on back order she wants to speak with you about something else she can take since it is on back order.  Pt was last seen on  06-08-22 and next appt 08-26-22     QUEtiapine Fumarate 150 MG TABS Medication Date: 06/08/2022 Department: [facility] Ordering/Authorizing: [name], MD   Order Providers  Prescribing Provider Encounter Provider  [name], MD [name], MD   Outpatient Medication Detail   Disp Refills Start End   QUEtiapine Fumarate 150 MG TABS 90 tablet 0 06/16/2022 09/14/2022   Sig - Route: Take 150 mg by mouth at bedtime. - Oral   Sent to pharmacy as: QUEtiapine Fumarate 150 MG Tab   E-Prescribing Status: Receipt confirmed by pharmacy (06/08/2022  9:55 AM EDT)    Pharmacy  [id number]

## 2022-06-22 NOTE — Telephone Encounter (Signed)
Could you contact her and ask if she is willing to find another pharmacy to send in the order. She has tried other medication with limited benefit or side effect, so the hope is to stay on this medication.

## 2022-06-23 ENCOUNTER — Other Ambulatory Visit: Payer: Self-pay | Admitting: Psychiatry

## 2022-06-23 MED ORDER — QUETIAPINE FUMARATE 50 MG PO TABS: 50.0000 mg | ORAL_TABLET | Freq: Every day | ORAL | 0 refills | Status: DC

## 2022-06-23 MED ORDER — QUETIAPINE FUMARATE 100 MG PO TABS
100.0000 mg | ORAL_TABLET | Freq: Every day | ORAL | 0 refills | Status: DC
Start: 2022-07-03 — End: 2022-08-26

## 2022-06-23 NOTE — Telephone Encounter (Signed)
pt states that with her insurance [facility] and that she states that they don't have any from a 28 mile radius. is there anything else.

## 2022-06-23 NOTE — Telephone Encounter (Signed)
Called the patient. She has found out that the pharmacy has quetiapine 50 mg and 100 mg strength. Will order both tablet to make 150 mg at night.

## 2022-07-12 ENCOUNTER — Other Ambulatory Visit: Payer: Self-pay | Admitting: Family Medicine

## 2022-07-12 ENCOUNTER — Other Ambulatory Visit: Payer: Self-pay | Admitting: Internal Medicine

## 2022-07-12 DIAGNOSIS — Z1231 Encounter for screening mammogram for malignant neoplasm of breast: Secondary | ICD-10-CM

## 2022-08-24 NOTE — Progress Notes (Signed)
Virtual Visit via Video Note  I connected with Taffie Eckmann Kok on 08/26/22 at  9:00 AM EST by a video enabled telemedicine application and verified that I am speaking with the correct person using two identifiers.  Location: Patient: home Provider: office Persons participated in the visit- patient, provider    I discussed the limitations of evaluation and management by telemedicine and the availability of in person appointments. The patient expressed understanding and agreed to proceed.    I discussed the assessment and treatment plan with the patient. The patient was provided an opportunity to ask questions and all were answered. The patient agreed with the plan and demonstrated an understanding of the instructions.   The patient was advised to call back or seek an in-person evaluation if the symptoms worsen or if the condition fails to improve as anticipated.  I provided 20 minutes of non-face-to-face time during this encounter.   [name], MD    [facility] MD/PA/NP OP Progress Note  08/26/2022 9:36 AM Mazey Mantell  MRN:  [id number]  Chief Complaint:  Chief Complaint  Patient presents with   Follow-up   Depression   HPI:  This is a follow-up appointment for bipolar disorder and insomnia.  She states that she has been doing well.  She had a nice Thanksgiving with her neighbor.  She has been for behind the house work due to social activities.  She feels a little sad about her dog, whose health is declining.  However, she denies feeling depressed except the time she missed to take lamotrigine for a day or two. Although she feels tired later in the day, she feels refreshed in the morning.  She denies insomnia.  She denies change in appetite.  She denies SI.  She denies decreased need for sleep or euphonia.  She feels comfortable to stay on the current medication regimen.   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 Bipolar 1 disorder (HCC)  F31.9     2. Insomnia, unspecified type  G47.00       Past Psychiatric History: Please see initial evaluation for full details. I have reviewed the history. No updates at this time.     Past Medical History:  Past Medical History:  Diagnosis Date   Bipolar disorder ([place])    Blood transfusion without reported diagnosis    Chronic kidney disease    Depression    Elevated serum creatinine    related to lithium use   Helicobacter pylori (H. pylori) infection    pt denies   Hypothyroidism    Perirectal fistula    Thyroid disease    Trochanteric bursitis of right hip     Past Surgical History:  Procedure Laterality Date   COLONOSCOPY  01/06/2016   at [facility] WITH HEMORRHOIDECTOMY N/A 06/28/2017   Procedure: EXAM UNDER ANESTHESIA WITH EXTERNAL HEMORRHOIDECTOMY AND REPAIR OF PERIRECTAL FISTULA;  Surgeon: Michael Boston, MD;  Location: [facility];  Service: General;  Laterality: N/A;   HERNIA REPAIR  2-68 yrs ago   umbilical hernia  INCISION AND DRAINAGE PERIRECTAL ABSCESS N/A 04/18/2017   Procedure: IRRIGATION AND DEBRIDEMENT PERIRECTAL ABSCESS, placement of seton, sigmoidoscopy;  Surgeon: Excell Seltzer, MD;  Location: [facility];  Service: General;  Laterality: N/A;   LAPAROSCOPIC APPENDECTOMY     POLYPECTOMY     VAGINAL HYSTERECTOMY Bilateral    complete   WISDOM TOOTH EXTRACTION      Family Psychiatric History: Please see initial evaluation for full details. I have reviewed the history. No updates at this time.    Family History:  Family History  Problem Relation Age of Onset   Anxiety disorder Brother    Breast cancer  Neg Hx    Colon cancer Neg Hx    Colon polyps Neg Hx    Esophageal cancer Neg Hx    Rectal cancer Neg Hx    Stomach cancer Neg Hx     Social History:  Social History   Socioeconomic History   Marital status: Married    Spouse name: Not on file   Number of children: Not on file   Years of education: Not on file   Highest education level: Not on file  Occupational History   Not on file  Tobacco Use   Smoking status: Former    Packs/day: 1.00    Years: 30.00    Total pack years: 30.00    Types: Cigarettes   Smokeless tobacco: Never   Tobacco comments:    quit 2008  Vaping Use   Vaping Use: Never used  Substance and Sexual Activity   Alcohol use: Yes    Comment: once a month per pt   Drug use: No   Sexual activity: Yes    Partners: Male    Birth control/protection: Surgical  Other Topics Concern   Not on file  Social History Narrative   Not on file   Social Determinants of Health   Financial Resource Strain: Not on file  Food Insecurity: Not on file  Transportation Needs: Not on file  Physical Activity: Not on file  Stress: Not on file  Social Connections: Not on file    Allergies:  Allergies  Allergen Reactions   Metrogel [Metronidazole] Other (See Comments)    Burn face   Tetracyclines & Related Rash    Metabolic Disorder Labs: Lab Results  Component Value Date   HGBA1C 5.5 09/04/2020   MPG 108 06/10/2015   No results found for: "PROLACTIN" Lab Results  Component Value Date   CHOL 205 (H) 12/25/2020   TRIG 76.0 12/25/2020   HDL 73.30 12/25/2020   CHOLHDL 3 12/25/2020   VLDL 15.2 12/25/2020   LDLCALC 116 (H) 12/25/2020   LDLCALC 122 (H) 09/04/2020   Lab Results  Component Value Date   TSH 0.05 (L) 05/28/2021   TSH 1.32 03/08/2021    Therapeutic Level Labs: Lab Results  Component Value Date   LITHIUM 1.2 11/08/2018   LITHIUM 0.80 06/10/2015   No results found for: "VALPROATE" No results found for: "CBMZ"  Current  Medications: Current Outpatient Medications  Medication Sig Dispense Refill   Calcium Carbonate-Vitamin D (CALTRATE 600+D PO) Take 2 tablets by mouth daily.     FLUoxetine (PROZAC) 20 MG capsule Take 1 capsule (20 mg total) by mouth daily. 90 capsule 1   [START ON 10/09/2022] lamoTRIgine (LAMICTAL) 25 MG tablet Take 2 tablets (50 mg total) by mouth daily. 180 tablet 0   levothyroxine (SYNTHROID) 150 MCG tablet Take 1 tablet (150 mcg total) by mouth daily. 90 tablet 1  levothyroxine (SYNTHROID) 175 MCG tablet TAKE 1 TABLET BY MOUTH EVERY DAY 90 tablet 0   Multiple Vitamin (MULTIVITAMIN WITH MINERALS) TABS tablet Take 1 tablet by mouth daily.     Omega-3 Fatty Acids (FISH OIL) 1000 MG CAPS Take 2,000 mg by mouth daily.     [START ON 10/02/2022] QUEtiapine (SEROQUEL) 100 MG tablet Take 1 tablet (100 mg total) by mouth at bedtime. Total of 150 mg at night. Take along with 50 mg tab 90 tablet 1   [START ON 10/02/2022] QUEtiapine (SEROQUEL) 50 MG tablet Take 1 tablet (50 mg total) by mouth at bedtime. Total of 150 mg at night. Take along with 100 mg tab 90 tablet 1   Vitamin D, Ergocalciferol, (DRISDOL) 1.25 MG (50000 UNIT) CAPS capsule Take 1 capsule (50,000 Units total) by mouth every 7 (seven) days. 12 capsule 0   No current facility-administered medications for this visit.     Musculoskeletal: Strength & Muscle Tone:  N/A Gait & Station:  N/A Patient leans: N/A  Psychiatric Specialty Exam: Review of Systems  Psychiatric/Behavioral:  Negative for agitation, behavioral problems, confusion, decreased concentration, dysphoric mood, hallucinations, self-injury, sleep disturbance and suicidal ideas. The patient is nervous/anxious. The patient is not hyperactive.   All other systems reviewed and are negative.   There were no vitals taken for this visit.There is no height or weight on file to calculate BMI.  General Appearance: Fairly Groomed  Eye Contact:  Good  Speech:  Clear and Coherent   Volume:  Normal  Mood:   good  Affect:  Appropriate and Congruent  Thought Process:  Coherent  Orientation:  Full (Time, Place, and Person)  Thought Content: Logical   Suicidal Thoughts:  No  Homicidal Thoughts:  No  Memory:  Immediate;   Good  Judgement:  Good  Insight:  Good  Psychomotor Activity:  Normal  Concentration:  Concentration: Good and Attention Span: Good  Recall:  Good  Fund of Knowledge: Good  Language: Good  Akathisia:  No  Handed:  Right  AIMS (if indicated): not done  Assets:  Communication Skills Desire for Improvement  ADL's:  Intact  Cognition: WNL  Sleep:  Good   Screenings: PHQ2-9    Flowsheet Row Video Visit from 10/12/2021 in [facility] Visit from 07/12/2021 in [facility] Video Visit from 02/11/2021 in [facility] Video Visit from 11/12/2020 in [facility] from 12/13/2019 in [facility] at [facility]  PHQ-2 Total Score 0 4 0 0 0  PHQ-9 Total Score -- 11 -- -- 6      Flowsheet Row Video Visit from 10/12/2021 in [facility] Video Visit from 07/21/2021 in [facility] Office Visit from 07/12/2021 in [facility] No Risk Error: Q3, 4, or 5 should not be populated when Q2 is No High Risk        Assessment and Plan:  Hansini Clodfelter is a 61 y.o. 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   1. Bipolar 1 disorder ([place]) There has been a steady improvement in depressive symptoms since the last visit.  She enjoys her job, and has good connection with her neighbors.  Will continue current dose of lamotrigine, quetiapine, fluoxetine to  target bipolar depression.  Discussed potential risk  of QTc prolongation with concurrent use of lamotrigine and quetiapine.  She will get an EKG by her PCP.   2. Insomnia, unspecified type Improving since being on quetiapine.  Will continue current dose to target insomnia.      Plan:   Continue lamotrigine 50 mg daily  (dizziness from 100 mg ) - she has 100 tabs left Continue quetiapine 150 mg at night  Continue fluoxetine 20 mg daily - monitor fatigue- filled on 9/5 Next appointment: 12/1 at 9 AM for 30 mins, video Obtain EKG at her PCP (scheduled in Dec)   Past trials of medication: Sertraline, fluoxetine (drowsiness), Lexapro, Duloxetine, venlafaxine (initially worked well, stopped working, hair loss), Wellbutrin (insomnia),  Paxil (urinary retention), Latuda, Abilify (flu like symptoms), Haldol (EPS), Thorazine (EPS), Stelazine,  Lithium, Lunesta (sedation), Ambien (sleepwalking, driving, eating), Trazodone (excessive sedation), Xanax    The patient demonstrates the following  risk factors for suicide: Chronic risk factors for suicide include psychiatric disorder /bipolar disorder, history of substance use disorder, family history of suicide attempt. Acute risk factors for suicide include none. Protective factors for this patient include positive social support, responsibility to others (family), coping skills, hope for the future. The overall suicide risk at this point appears to be low, and she is appropriate for outpatient follow up.    This clinician has discussed the side effect associated with medication prescribed during this encounter. Please refer to notes in the previous encounters for more details.       Collaboration of Care: Collaboration of Care: Other reviewed notes in Epic  Patient/Guardian was advised Release of Information must be obtained prior to any record release in order to collaborate their care with an outside provider. Patient/Guardian was advised if they have not  already done so to contact the registration department to sign all necessary forms in order for [place] to release information regarding their care.   Consent: Patient/Guardian gives verbal consent for treatment and assignment of benefits for services provided during this visit. Patient/Guardian expressed understanding and agreed to proceed.    [name], MD 08/26/2022, 9:36 AM

## 2022-08-26 ENCOUNTER — Encounter: Payer: Self-pay | Admitting: Psychiatry

## 2022-08-26 ENCOUNTER — Telehealth (INDEPENDENT_AMBULATORY_CARE_PROVIDER_SITE_OTHER): Payer: 59 | Admitting: Psychiatry

## 2022-08-26 DIAGNOSIS — G4700 Insomnia, unspecified: Secondary | ICD-10-CM

## 2022-08-26 DIAGNOSIS — F319 Bipolar disorder, unspecified: Secondary | ICD-10-CM | POA: Diagnosis not present

## 2022-08-26 MED ORDER — LAMOTRIGINE 25 MG PO TABS: 50.0000 mg | ORAL_TABLET | Freq: Every day | ORAL | 0 refills | Status: DC

## 2022-08-26 MED ORDER — FLUOXETINE HCL 20 MG PO CAPS: 20.0000 mg | ORAL_CAPSULE | Freq: Every day | ORAL | 1 refills | Status: DC

## 2022-08-26 MED ORDER — QUETIAPINE FUMARATE 100 MG PO TABS
100.0000 mg | ORAL_TABLET | Freq: Every day | ORAL | 1 refills | Status: DC
Start: 2022-10-02 — End: 2023-03-08

## 2022-08-26 MED ORDER — QUETIAPINE FUMARATE 50 MG PO TABS
50.0000 mg | ORAL_TABLET | Freq: Every day | ORAL | 1 refills | Status: DC
Start: 2022-10-02 — End: 2023-03-08

## 2022-08-30 ENCOUNTER — Ambulatory Visit
Admission: RE | Admit: 2022-08-30 | Discharge: 2022-08-30 | Disposition: A | Discharge status: Home / Self Care | Payer: 59 | Source: Ambulatory Visit | Attending: Family Medicine | Admitting: Family Medicine

## 2022-08-30 DIAGNOSIS — Z1231 Encounter for screening mammogram for malignant neoplasm of breast: Secondary | ICD-10-CM

## 2022-10-18 NOTE — Progress Notes (Signed)
This encounter was created in error - please disregard.

## 2022-10-25 NOTE — Progress Notes (Signed)
Virtual Visit via Video Note  I connected with Miranda Harris on 10/28/22 at  9:00 AM EST by a video enabled telemedicine application and verified that I am speaking with the correct person using two identifiers.  Location: Patient: home Provider: office Persons participated in the visit- patient, provider    I discussed the limitations of evaluation and management by telemedicine and the availability of in person appointments. The patient expressed understanding and agreed to proceed.    I discussed the assessment and treatment plan with the patient. The patient was provided an opportunity to ask questions and all were answered. The patient agreed with the plan and demonstrated an understanding of the instructions.   The patient was advised to call back or seek an in-person evaluation if the symptoms worsen or if the condition fails to improve as anticipated.  I provided 20 minutes of non-face-to-face time during this encounter.   [name], MD    [facility] MD/PA/NP OP Progress Note  10/28/2022 9:33 AM Miranda Harris  MRN:  [id number]  Chief Complaint:  Chief Complaint  Patient presents with   Follow-up   HPI:  This is a follow-up appointment for depression.  She states that she thinks Prozac is not working anymore.  She feels depressed again.  She does not want to get up in the morning.  She feels like she wants to cry.  She has SI, feeling that wish to be better off dead, although she denies any plan or intent.  Although she does not think she would do it to her family, she agrees to contact emergency resources if any worsening.  She sleeps 8 hours.  She has worsening in concentration.  She is making mistakes at work.  She denies hallucinations.  She rarely drinks alcohol.  She denies drug use.  She denies decreased need for sleep or euphonia.  She wants to stay on quetiapine as it has been helpful for sleep.  She does not want to uptitrate fluoxetine either due to drowsiness.   She agrees with the plans as below.   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 Bipolar 1 disorder (HCC)  F31.9     2. Insomnia, unspecified type  G47.00       Past Psychiatric History: Please see initial evaluation for full details. I have reviewed the history. No updates at this time.     Past Medical History:  Past Medical History:  Diagnosis Date   Bipolar disorder ([place])    Blood transfusion without reported diagnosis    Chronic kidney disease    Depression    Elevated serum creatinine    related to lithium use   Helicobacter pylori (H. pylori) infection    pt denies   Hypothyroidism    Perirectal fistula    Thyroid disease    Trochanteric bursitis of right hip     Past Surgical History:  Procedure Laterality Date   COLONOSCOPY  01/06/2016   at [facility] WITH HEMORRHOIDECTOMY N/A 06/28/2017   Procedure: EXAM UNDER ANESTHESIA WITH EXTERNAL HEMORRHOIDECTOMY AND REPAIR OF PERIRECTAL FISTULA;  Surgeon: Michael Boston, MD;  Location: [facility];  Service: General;  Laterality: N/A;   HERNIA REPAIR  6-37 yrs ago   umbilical hernia  INCISION AND DRAINAGE PERIRECTAL ABSCESS N/A 04/18/2017   Procedure: IRRIGATION AND DEBRIDEMENT PERIRECTAL ABSCESS, placement of seton, sigmoidoscopy;  Surgeon: Excell Seltzer, MD;  Location: [facility];  Service: General;  Laterality: N/A;   LAPAROSCOPIC APPENDECTOMY     POLYPECTOMY     VAGINAL HYSTERECTOMY Bilateral    complete   WISDOM TOOTH EXTRACTION      Family Psychiatric History: Please see initial evaluation for full details. I have reviewed the history. No updates at this time.     Family History:  Family History  Problem Relation Age of Onset   Anxiety disorder Brother    Breast cancer Neg  Hx    Colon cancer Neg Hx    Colon polyps Neg Hx    Esophageal cancer Neg Hx    Rectal cancer Neg Hx    Stomach cancer Neg Hx     Social History:  Social History   Socioeconomic History   Marital status: Married    Spouse name: Not on file   Number of children: Not on file   Years of education: Not on file   Highest education level: Not on file  Occupational History   Not on file  Tobacco Use   Smoking status: Former    Packs/day: 1.00    Years: 30.00    Total pack years: 30.00    Types: Cigarettes   Smokeless tobacco: Never   Tobacco comments:    quit 2008  Vaping Use   Vaping Use: Never used  Substance and Sexual Activity   Alcohol use: Yes    Comment: once a month per pt   Drug use: No   Sexual activity: Yes    Partners: Male    Birth control/protection: Surgical  Other Topics Concern   Not on file  Social History Narrative   Not on file   Social Determinants of Health   Financial Resource Strain: Not on file  Food Insecurity: Not on file  Transportation Needs: Not on file  Physical Activity: Not on file  Stress: Not on file  Social Connections: Not on file    Allergies:  Allergies  Allergen Reactions   Metrogel [Metronidazole] Other (See Comments)    Burn face   Tetracyclines & Related Rash    Metabolic Disorder Labs: Lab Results  Component Value Date   HGBA1C 5.5 09/04/2020   MPG 108 06/10/2015   No results found for: "PROLACTIN" Lab Results  Component Value Date   CHOL 205 (H) 12/25/2020   TRIG 76.0 12/25/2020   HDL 73.30 12/25/2020   CHOLHDL 3 12/25/2020   VLDL 15.2 12/25/2020   LDLCALC 116 (H) 12/25/2020   LDLCALC 122 (H) 09/04/2020   Lab Results  Component Value Date   TSH 0.05 (L) 05/28/2021   TSH 1.32 03/08/2021    Therapeutic Level Labs: Lab Results  Component Value Date   LITHIUM 1.2 11/08/2018   LITHIUM 0.80 06/10/2015   No results found for: "VALPROATE" No results found for: "CBMZ"  Current  Medications: Current Outpatient Medications  Medication Sig Dispense Refill   vortioxetine HBr (TRINTELLIX) 10 MG TABS tablet 5 mg at night for one week, then 10 mg at night 30 tablet 1   Calcium Carbonate-Vitamin D (CALTRATE 600+D PO) Take 2 tablets by mouth daily.     FLUoxetine (PROZAC) 20 MG capsule Take 1 capsule (20 mg total) by mouth daily. 90 capsule 1   lamoTRIgine (LAMICTAL) 25 MG tablet Take 2 tablets (50 mg total) by mouth daily. [place]  tablet 0   levothyroxine (SYNTHROID) 150 MCG tablet Take 1 tablet (150 mcg total) by mouth daily. 90 tablet 1   levothyroxine (SYNTHROID) 175 MCG tablet TAKE 1 TABLET BY MOUTH EVERY DAY 90 tablet 0   Multiple Vitamin (MULTIVITAMIN WITH MINERALS) TABS tablet Take 1 tablet by mouth daily.     Omega-3 Fatty Acids (FISH OIL) 1000 MG CAPS Take 2,000 mg by mouth daily.     QUEtiapine (SEROQUEL) 100 MG tablet Take 1 tablet (100 mg total) by mouth at bedtime. Total of 150 mg at night. Take along with 50 mg tab 90 tablet 1   QUEtiapine (SEROQUEL) 50 MG tablet Take 1 tablet (50 mg total) by mouth at bedtime. Total of 150 mg at night. Take along with 100 mg tab 90 tablet 1   Vitamin D, Ergocalciferol, (DRISDOL) 1.25 MG (50000 UNIT) CAPS capsule Take 1 capsule (50,000 Units total) by mouth every 7 (seven) days. 12 capsule 0   No current facility-administered medications for this visit.     Musculoskeletal: Strength & Muscle Tone:  N/A Gait & Station:  N/A Patient leans: N/A  Psychiatric Specialty Exam: Review of Systems  Psychiatric/Behavioral:  Positive for decreased concentration, dysphoric mood and suicidal ideas. Negative for agitation, behavioral problems, confusion, hallucinations, self-injury and sleep disturbance. The patient is nervous/anxious. The patient is not hyperactive.   All other systems reviewed and are negative.   There were no vitals taken for this visit.There is no height or weight on file to calculate BMI.  General Appearance: Fairly  Groomed  Eye Contact:  Good  Speech:  Clear and Coherent  Volume:  Normal  Mood:  Depressed  Affect:  Appropriate, Congruent, and down  Thought Process:  Coherent  Orientation:  Full (Time, Place, and Person)  Thought Content: Logical   Suicidal Thoughts:  Yes.  without intent/plan  Homicidal Thoughts:  No  Memory:  Immediate;   Good  Judgement:  Good  Insight:  Good  Psychomotor Activity:  Normal  Concentration:  Concentration: Good and Attention Span: Good  Recall:  Good  Fund of Knowledge: Good  Language: Good  Akathisia:  No  Handed:  Right  AIMS (if indicated): not done  Assets:  Communication Skills Desire for Improvement  ADL's:  Intact  Cognition: WNL  Sleep:  Good   Screenings: PHQ2-9    Flowsheet Row Video Visit from 10/12/2021 in [facility] Office Visit from 07/12/2021 in [place] Video Visit from 02/11/2021 in [facility] Video Visit from 11/12/2020 in [facility] Office Visit from 12/13/2019 in [facility] at [facility]  PHQ-2 Total Score 0 4 0 0 0  PHQ-9 Total Score -- 11 -- -- 6      Flowsheet Row Video Visit from 10/12/2021 in [facility] Video Visit from 07/21/2021 in [facility] Office Visit from 07/12/2021 in [facility] No Risk Error: Q3, 4, or 5 should not be populated when Q2 is No High Risk        Assessment and Plan:  Miranda Harris is a 62 y.o. 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   1. Bipolar 1 disorder ([place]) Acute stressors include:  declining in the condition of her dog  Other stressors include:    History:   There has been worsening in depressive symptoms without significant triggers since the last visit.  After discussing treatment options, will try switching from fluoxetine to Trintellix to see if it is helpful for depressive symptoms.  Discussed potential GI side effect.  Uptitration of quetiapine is not the best option at this time due to drowsiness she experiences.  Noted that she prefers to stay on quetiapine given it has been helpful for insomnia, although switching to Vraylar could be considered in the future.  Will continue lamotrigine for bipolar depression.  She was advised again to obtain EKG by her PCP.   2. Insomnia, unspecified type Stable.  Will continue current dose of quetiapine to target insomnia.       Plan:   Continue lamotrigine 50 mg daily  (dizziness from 100 mg )  Continue quetiapine 150 mg at night  Start Trintellix 5 mg at night for one week, then 10 mg at night  Discontinue fluoxetine after the day you start fluoxetine (was on 20 mg, caused drowsiness) Next appointment: 3/20 at 4:30 for 30 mins, video Obtain EKG at her PCP    Past trials of medication: Sertraline, fluoxetine (drowsiness), Lexapro,  Duloxetine, venlafaxine (initially worked well, stopped working, hair loss), bupropion (insomnia),  Paxil (urinary retention), Latuda, Abilify (flu like symptoms), Haldol (EPS), Thorazine (EPS), Stelazine,  Lithium, Lunesta (sedation), Ambien (sleepwalking, driving, eating), Trazodone (excessive sedation), Xanax    The patient demonstrates the following  risk factors for suicide: Chronic risk factors for suicide include psychiatric disorder /bipolar disorder, history of substance use disorder, family history of suicide attempt. Acute risk factors for suicide include none. Protective factors for this patient include positive social support, responsibility to others (family), coping skills, hope for the  future. The overall suicide risk at this point appears to be low, and she is appropriate for outpatient follow up.  Emergency resources which includes 911, ED, suicide crisis line are discussed.    Collaboration of Care: Collaboration of Care: Other reviewed notes in Epic  Patient/Guardian was advised Release of Information must be obtained prior to any record release in order to collaborate their care with an outside provider. Patient/Guardian was advised if they have not already done so to contact the registration department to sign all necessary forms in order for [place] to release information regarding their care.   Consent: Patient/Guardian gives verbal consent for treatment and assignment of benefits for services provided during this visit. Patient/Guardian expressed understanding and agreed to proceed.    [name], MD 10/28/2022, 9:33 AM

## 2022-10-28 ENCOUNTER — Telehealth (INDEPENDENT_AMBULATORY_CARE_PROVIDER_SITE_OTHER): Payer: 59 | Admitting: Psychiatry

## 2022-10-28 ENCOUNTER — Encounter: Payer: Self-pay | Admitting: Psychiatry

## 2022-10-28 DIAGNOSIS — G4700 Insomnia, unspecified: Secondary | ICD-10-CM | POA: Diagnosis not present

## 2022-10-28 DIAGNOSIS — F319 Bipolar disorder, unspecified: Secondary | ICD-10-CM | POA: Diagnosis not present

## 2022-10-28 MED ORDER — VORTIOXETINE HBR 10 MG PO TABS: ORAL_TABLET | ORAL | 1 refills | Status: DC

## 2022-10-28 NOTE — Patient Instructions (Signed)
Continue lamotrigine 50 mg daily   Continue quetiapine 150 mg at night  Start Trintellix 5 mg at night for one week, then 10 mg at night  Discontinue fluoxetine after the day you start fluoxetine  Next appointment: 3/20 at 4:30 for 30 mins, video Obtain EKG at her PCP

## 2022-11-22 ENCOUNTER — Telehealth: Payer: Self-pay | Admitting: Psychiatry

## 2022-11-22 NOTE — Telephone Encounter (Signed)
Reviewed EKG. QTc 389 msec, HR 64 10/2022.

## 2022-11-23 ENCOUNTER — Other Ambulatory Visit: Payer: Self-pay | Admitting: Psychiatry

## 2022-12-11 NOTE — Progress Notes (Signed)
Virtual Visit via Video Note  I connected with Miranda Harris on 12/14/22 at  4:30 PM EDT by a video enabled telemedicine application and verified that I am speaking with the correct person using two identifiers.  Location: Patient: home Provider: office Persons participated in the visit- patient, provider    I discussed the limitations of evaluation and management by telemedicine and the availability of in person appointments. The patient expressed understanding and agreed to proceed.   I discussed the assessment and treatment plan with the patient. The patient was provided an opportunity to ask questions and all were answered. The patient agreed with the plan and demonstrated an understanding of the instructions.   The patient was advised to call back or seek an in-person evaluation if the symptoms worsen or if the condition fails to improve as anticipated.  I provided 20 minutes of non-face-to-face time during this encounter.   Miranda Hotter, MD     [facility] MD/PA/NP OP Progress Note  12/14/2022 5:02 PM Miranda Harris  MRN:  [id number]  Chief Complaint:  Chief Complaint  Patient presents with   Follow-up   HPI:  This is a follow-up appointment for bipolar disorder and insomnia.  She states that she has been feeling better, and not depressed since starting Trintellix.  She is more cheerful and positive.  She is concerned about its cost as it costs $75 per month.  She is advised to try coupon on the website.  She also has another concern about quetiapine.  She feels that she does not have any REM sleep as she does not have any dreams.  She is very concerned about this.  She also feels drowsy from quetiapine, although she thought it was coming from fluoxetine.  There has been no change in the drowsiness since discontinuation of fluoxetine.  She is not interested in switching to medication which does not help for sleep while she experiences drowsiness.  She asks if she can  discontinue quetiapine.  After extensive discussion, she agrees to try lower the dose of quetiapine.  She denies change in appetite.  She denies SI.   Visit Diagnosis:    ICD-10-CM   1. Bipolar 1 disorder (HCC)  F31.9     2. Insomnia, unspecified type  G47.00       Past Psychiatric History: Please see initial evaluation for full details. I have reviewed the history. No updates at this time.     Past Medical History:  Past Medical History:  Diagnosis Date   Bipolar disorder (HCC)    Blood transfusion without reported diagnosis    Chronic kidney disease    Depression    Elevated serum creatinine    related to lithium use   Helicobacter pylori (H. pylori) infection    pt denies   Hypothyroidism    Perirectal fistula    Thyroid disease    Trochanteric bursitis of right hip     Past Surgical History:  Procedure Laterality Date   COLONOSCOPY  01/06/2016   at [facility]   EVALUATION UNDER ANESTHESIA WITH HEMORRHOIDECTOMY N/A 06/28/2017   Procedure: EXAM UNDER ANESTHESIA WITH EXTERNAL HEMORRHOIDECTOMY AND REPAIR OF PERIRECTAL FISTULA;  Surgeon: Karie Soda, MD;  Location: [facility] [place];  Service: General;  Laterality: N/A;   HERNIA REPAIR  8-10 yrs ago   umbilical hernia   INCISION AND DRAINAGE PERIRECTAL ABSCESS N/A 04/18/2017   Procedure: IRRIGATION AND DEBRIDEMENT PERIRECTAL ABSCESS, placement of seton, sigmoidoscopy;  Surgeon: Glenna Fellows, MD;  Location:  [facility];  Service: General;  Laterality: N/A;   LAPAROSCOPIC APPENDECTOMY     POLYPECTOMY     VAGINAL HYSTERECTOMY Bilateral    complete   WISDOM TOOTH EXTRACTION      Family Psychiatric History: Please see initial evaluation for full details. I have reviewed the history. No updates at this time.     Family History:  Family History  Problem Relation Age of Onset   Anxiety disorder Brother    Breast cancer Neg Hx    Colon cancer Neg Hx    Colon polyps Neg Hx    Esophageal cancer Neg Hx    Rectal  cancer Neg Hx    Stomach cancer Neg Hx     Social History:  Social History   Socioeconomic History   Marital status: Married    Spouse name: Not on file   Number of children: Not on file   Years of education: Not on file   Highest education level: Not on file  Occupational History   Not on file  Tobacco Use   Smoking status: Former    Packs/day: 1.00    Years: 30.00    Additional pack years: 0.00    Total pack years: 30.00    Types: Cigarettes   Smokeless tobacco: Never   Tobacco comments:    quit 2008  Vaping Use   Vaping Use: Never used  Substance and Sexual Activity   Alcohol use: Yes    Comment: once a month per pt   Drug use: No   Sexual activity: Yes    Partners: Male    Birth control/protection: Surgical  Other Topics Concern   Not on file  Social History Narrative   Not on file   Social Determinants of Health   Financial Resource Strain: Not on file  Food Insecurity: Not on file  Transportation Needs: Not on file  Physical Activity: Not on file  Stress: Not on file  Social Connections: Not on file    Allergies:  Allergies  Allergen Reactions   Metrogel [Metronidazole] Other (See Comments)    Burn face   Tetracyclines & Related Rash    Metabolic Disorder Labs: Lab Results  Component Value Date   HGBA1C 5.5 09/04/2020   MPG 108 06/10/2015   No results found for: "PROLACTIN" Lab Results  Component Value Date   CHOL 205 (H) 12/25/2020   TRIG 76.0 12/25/2020   HDL 73.30 12/25/2020   CHOLHDL 3 12/25/2020   VLDL 15.2 12/25/2020   LDLCALC 116 (H) 12/25/2020   LDLCALC 122 (H) 09/04/2020   Lab Results  Component Value Date   TSH 0.05 (L) 05/28/2021   TSH 1.32 03/08/2021    Therapeutic Level Labs: Lab Results  Component Value Date   LITHIUM 1.2 11/08/2018   LITHIUM 0.80 06/10/2015   No results found for: "VALPROATE" No results found for: "CBMZ"  Current Medications: Current Outpatient Medications  Medication Sig Dispense Refill    Calcium Carbonate-Vitamin D (CALTRATE 600+D PO) Take 2 tablets by mouth daily.     FLUoxetine (PROZAC) 20 MG capsule Take 1 capsule (20 mg total) by mouth daily. 90 capsule 1   lamoTRIgine (LAMICTAL) 25 MG tablet Take 2 tablets (50 mg total) by mouth daily. 180 tablet 0   levothyroxine (SYNTHROID) 150 MCG tablet Take 1 tablet (150 mcg total) by mouth daily. 90 tablet 1   levothyroxine (SYNTHROID) 175 MCG tablet TAKE 1 TABLET BY MOUTH EVERY DAY 90 tablet 0   Multiple Vitamin (MULTIVITAMIN  WITH MINERALS) TABS tablet Take 1 tablet by mouth daily.     Omega-3 Fatty Acids (FISH OIL) 1000 MG CAPS Take 2,000 mg by mouth daily.     QUEtiapine (SEROQUEL) 100 MG tablet Take 1 tablet (100 mg total) by mouth at bedtime. Total of 150 mg at night. Take along with 50 mg tab 90 tablet 1   QUEtiapine (SEROQUEL) 50 MG tablet Take 1 tablet (50 mg total) by mouth at bedtime. Total of 150 mg at night. Take along with 100 mg tab 90 tablet 1   Vitamin D, Ergocalciferol, (DRISDOL) 1.25 MG (50000 UNIT) CAPS capsule Take 1 capsule (50,000 Units total) by mouth every 7 (seven) days. 12 capsule 0   vortioxetine HBr (TRINTELLIX) 10 MG TABS tablet 5 mg at night for one week, then 10 mg at night 30 tablet 1   No current facility-administered medications for this visit.     Musculoskeletal: Strength & Muscle Tone:  N/A Gait & Station:  N/A Patient leans: N/A  Psychiatric Specialty Exam: Review of Systems  Psychiatric/Behavioral: Negative.    All other systems reviewed and are negative.   There were no vitals taken for this visit.There is no height or weight on file to calculate BMI.  General Appearance: Fairly Groomed  Eye Contact:  Good  Speech:  Clear and Coherent  Volume:  Normal  Mood:   better  Affect:  Appropriate, Congruent, and Full Range  Thought Process:  Coherent  Orientation:  Full (Time, Place, and Person)  Thought Content: Logical   Suicidal Thoughts:  No  Homicidal Thoughts:  No  Memory:   Immediate;   Good  Judgement:  Good  Insight:  Good  Psychomotor Activity:  Normal  Concentration:  Concentration: Good and Attention Span: Good  Recall:  Good  Fund of Knowledge: Good  Language: Good  Akathisia:  No  Handed:  Right  AIMS (if indicated): not done  Assets:  Communication Skills Desire for Improvement  ADL's:  Intact  Cognition: WNL  Sleep:  Good   Screenings: PHQ2-9    Flowsheet Row Video Visit from 10/12/2021 in [facility] Office Visit from 07/12/2021 in [facility] Video Visit from 02/11/2021 in [facility] Video Visit from 11/12/2020 in [facility] Office Visit from 12/13/2019 in [facility] [place]  PHQ-2 Total Score 0 4 0 0 0  PHQ-9 Total Score -- 11 -- -- 6      Flowsheet Row Video Visit from 10/12/2021 in [facility] Video Visit from 07/21/2021 in [facility] Office Visit from 07/12/2021 in [facility]  C-SSRS RISK CATEGORY No Risk Error: Q3, 4, or 5 should not be populated when Q2 is No High Risk        Assessment and Plan:  Miranda Harris is a 62 y.o. 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   1. Bipolar 1 disorder (HCC) Acute stressors include: declining in the condition of her dog  Other stressors include:    History:   There has been a significant improvement in depressive symptoms since starting Trintellix.  She now wants to taper off quetiapine due to concern of reported REM sleep due to her not having  any dreams, and drowsiness.  Although she is advised to consider  switching to medications such as Geodon or olanzapine, she is not interested in this.  After having extensive discussion, she agrees to lower the dose of quetiapine rather than tapering off this medication.  Discussed potential risk of relapse in bipolar disorder.  Will continue lamotrigine for bipolar disorder.  Will continue Trintellix for bipolar depression.   2. Insomnia, unspecified type Stable.  Although she had good benefit from quetiapine, she would like to reduce the dose as described above.      Plan:   Continue lamotrigine 50 mg daily  (dizziness from 100 mg )  Decrease quetiapine 125 mg at night  EKG. QTc 389 msec, HR 64 10/2022  Continue Trintellix 10 mg at night  Next appointment: 4/17 at 10 30 for 30 mins, video - thyroid is within wnl per patient 10/2022    Past trials of medication: Sertraline, fluoxetine (drowsiness), Lexapro,  Duloxetine, venlafaxine (initially worked well, stopped working, hair loss), bupropion (insomnia),  Paxil (urinary retention), Latuda, Abilify (flu like symptoms), Haldol (EPS), Thorazine (EPS), Stelazine,  Lithium, Lunesta (sedation), Ambien (sleepwalking, driving, eating), Trazodone (excessive sedation), Xanax    The patient demonstrates the following  risk factors for suicide: Chronic risk factors for suicide include psychiatric disorder /bipolar disorder, history of substance use disorder, family history of suicide attempt. Acute risk factors for suicide include none. Protective factors for this patient include positive social support, responsibility to others (family), coping skills, hope for the future. The overall suicide risk at this point appears to be low, and she is appropriate for outpatient follow up.   Collaboration of Care: Collaboration of Care: Other reviewed notes in Epic  Patient/Guardian was advised Release of Information must be obtained prior to any record release in order to collaborate their care with an outside provider.  Patient/Guardian was advised if they have not already done so to contact the registration department to sign all necessary forms in order for [place] to release information regarding their care.   Consent: Patient/Guardian gives verbal consent for treatment and assignment of benefits for services provided during this visit. Patient/Guardian expressed understanding and agreed to proceed.    Miranda Hotter, MD 12/14/2022, 5:02 PM

## 2022-12-14 ENCOUNTER — Telehealth (INDEPENDENT_AMBULATORY_CARE_PROVIDER_SITE_OTHER): Payer: 59 | Admitting: Psychiatry

## 2022-12-14 ENCOUNTER — Encounter: Payer: Self-pay | Admitting: Psychiatry

## 2022-12-14 DIAGNOSIS — F319 Bipolar disorder, unspecified: Secondary | ICD-10-CM | POA: Diagnosis not present

## 2022-12-14 DIAGNOSIS — G4700 Insomnia, unspecified: Secondary | ICD-10-CM | POA: Diagnosis not present

## 2022-12-29 ENCOUNTER — Other Ambulatory Visit: Payer: Self-pay | Admitting: Psychiatry

## 2022-12-29 ENCOUNTER — Telehealth: Payer: Self-pay

## 2022-12-29 MED ORDER — VORTIOXETINE HBR 10 MG PO TABS: 10.0000 mg | ORAL_TABLET | Freq: Every day | ORAL | 0 refills | Status: DC

## 2022-12-29 NOTE — Telephone Encounter (Signed)
Could you please clarify which medication she is referring to? Additionally, she will be running out of Trintellix, so I have placed a new order, fyi.

## 2022-12-29 NOTE — Telephone Encounter (Signed)
It has been ordered.

## 2022-12-29 NOTE — Telephone Encounter (Signed)
pt called states she needs rx with the correct directions on it for the trintellix. she states that the instruction is not correct. [facility] [place].

## 2022-12-29 NOTE — Telephone Encounter (Signed)
  Disp Refills Start End  vortioxetine HBr (TRINTELLIX) 10 MG TABS tablet 30 tablet 1 10/28/2022  Sig: 5 mg at night for one week, then 10 mg at night     *this is what you sent it needs to be 10mg  at night

## 2023-01-07 NOTE — Progress Notes (Deleted)
[facility] MD/PA/NP OP Progress Note  01/07/2023 3:30 PM Miranda Harris  MRN:  [id number]  Chief Complaint: No chief complaint on file.  HPI: *** Visit Diagnosis: No diagnosis found.  Past Psychiatric History: Please see initial evaluation for full details. I have reviewed the history. No updates at this time.     Past Medical History:  Past Medical History:  Diagnosis Date   Bipolar disorder (HCC)    Blood transfusion without reported diagnosis    Chronic kidney disease    Depression    Elevated serum creatinine    related to lithium use   Helicobacter pylori (H. pylori) infection    pt denies   Hypothyroidism    Perirectal fistula    Thyroid disease    Trochanteric bursitis of right hip     Past Surgical History:  Procedure Laterality Date   COLONOSCOPY  01/06/2016   at [facility]   EVALUATION UNDER ANESTHESIA WITH HEMORRHOIDECTOMY N/A 06/28/2017   Procedure: EXAM UNDER ANESTHESIA WITH EXTERNAL HEMORRHOIDECTOMY AND REPAIR OF PERIRECTAL FISTULA;  Surgeon: Karie Soda, MD;  Location: [facility];  Service: General;  Laterality: N/A;   HERNIA REPAIR  8-10 yrs ago   umbilical hernia   INCISION AND DRAINAGE PERIRECTAL ABSCESS N/A 04/18/2017   Procedure: IRRIGATION AND DEBRIDEMENT PERIRECTAL ABSCESS, placement of seton, sigmoidoscopy;  Surgeon: Glenna Fellows, MD;  Location: [facility];  Service: General;  Laterality: N/A;   LAPAROSCOPIC APPENDECTOMY     POLYPECTOMY     VAGINAL HYSTERECTOMY Bilateral    complete   WISDOM TOOTH EXTRACTION      Family Psychiatric History: Please see initial evaluation for full details. I have reviewed the history. No updates at this time.     Family History:  Family History  Problem Relation Age of Onset   Anxiety disorder Brother    Breast cancer Neg Hx    Colon cancer Neg Hx    Colon polyps Neg Hx    Esophageal cancer Neg Hx    Rectal cancer Neg Hx    Stomach cancer Neg Hx     Social History:  Social History    Socioeconomic History   Marital status: Married    Spouse name: Not on file   Number of children: Not on file   Years of education: Not on file   Highest education level: Not on file  Occupational History   Not on file  Tobacco Use   Smoking status: Former    Packs/day: 1.00    Years: 30.00    Additional pack years: 0.00    Total pack years: 30.00    Types: Cigarettes   Smokeless tobacco: Never   Tobacco comments:    quit 2008  Vaping Use   Vaping Use: Never used  Substance and Sexual Activity   Alcohol use: Yes    Comment: once a month per pt   Drug use: No   Sexual activity: Yes    Partners: Male    Birth control/protection: Surgical  Other Topics Concern   Not on file  Social History Narrative   Not on file   Social Determinants of Health   Financial Resource Strain: Not on file  Food Insecurity: Not on file  Transportation Needs: Not on file  Physical Activity: Not on file  Stress: Not on file  Social Connections: Not on file    Allergies:  Allergies  Allergen Reactions   Metrogel [Metronidazole] Other (See Comments)    Burn face   Tetracyclines &  Related Rash    Metabolic Disorder Labs: Lab Results  Component Value Date   HGBA1C 5.5 09/04/2020   MPG 108 06/10/2015   No results found for: "PROLACTIN" Lab Results  Component Value Date   CHOL 205 (H) 12/25/2020   TRIG 76.0 12/25/2020   HDL 73.30 12/25/2020   CHOLHDL 3 12/25/2020   VLDL 15.2 12/25/2020   LDLCALC 116 (H) 12/25/2020   LDLCALC 122 (H) 09/04/2020   Lab Results  Component Value Date   TSH 0.05 (L) 05/28/2021   TSH 1.32 03/08/2021    Therapeutic Level Labs: Lab Results  Component Value Date   LITHIUM 1.2 11/08/2018   LITHIUM 0.80 06/10/2015   No results found for: "VALPROATE" No results found for: "CBMZ"  Current Medications: Current Outpatient Medications  Medication Sig Dispense Refill   vortioxetine HBr (TRINTELLIX) 10 MG TABS tablet Take 1 tablet (10 mg total) by  mouth daily. 30 tablet 0   Calcium Carbonate-Vitamin D (CALTRATE 600+D PO) Take 2 tablets by mouth daily.     FLUoxetine (PROZAC) 20 MG capsule Take 1 capsule (20 mg total) by mouth daily. 90 capsule 1   lamoTRIgine (LAMICTAL) 25 MG tablet Take 2 tablets (50 mg total) by mouth daily. 180 tablet 0   levothyroxine (SYNTHROID) 150 MCG tablet Take 1 tablet (150 mcg total) by mouth daily. 90 tablet 1   levothyroxine (SYNTHROID) 175 MCG tablet TAKE 1 TABLET BY MOUTH EVERY DAY 90 tablet 0   Multiple Vitamin (MULTIVITAMIN WITH MINERALS) TABS tablet Take 1 tablet by mouth daily.     Omega-3 Fatty Acids (FISH OIL) 1000 MG CAPS Take 2,000 mg by mouth daily.     QUEtiapine (SEROQUEL) 100 MG tablet Take 1 tablet (100 mg total) by mouth at bedtime. Total of 150 mg at night. Take along with 50 mg tab 90 tablet 1   QUEtiapine (SEROQUEL) 50 MG tablet Take 1 tablet (50 mg total) by mouth at bedtime. Total of 150 mg at night. Take along with 100 mg tab 90 tablet 1   Vitamin D, Ergocalciferol, (DRISDOL) 1.25 MG (50000 UNIT) CAPS capsule Take 1 capsule (50,000 Units total) by mouth every 7 (seven) days. 12 capsule 0   vortioxetine HBr (TRINTELLIX) 10 MG TABS tablet 5 mg at night for one week, then 10 mg at night 30 tablet 1   No current facility-administered medications for this visit.     Musculoskeletal: Strength & Muscle Tone:  N/A Gait & Station:  N/A Patient leans: N/A  Psychiatric Specialty Exam: Review of Systems  There were no vitals taken for this visit.There is no height or weight on file to calculate BMI.  General Appearance: {Appearance:22683}  Eye Contact:  {BHH EYE CONTACT:22684}  Speech:  Clear and Coherent  Volume:  Normal  Mood:  {BHH MOOD:22306}  Affect:  {Affect (PAA):22687}  Thought Process:  Coherent  Orientation:  Full (Time, Place, and Person)  Thought Content: Logical   Suicidal Thoughts:  {ST/HT (PAA):22692}  Homicidal Thoughts:  {ST/HT (PAA):22692}  Memory:  Immediate;   Good   Judgement:  {Judgement (PAA):22694}  Insight:  {Insight (PAA):22695}  Psychomotor Activity:  Normal  Concentration:  Concentration: Good and Attention Span: Good  Recall:  Good  Fund of Knowledge: Good  Language: Good  Akathisia:  No  Handed:  Right  AIMS (if indicated): not done  Assets:  Communication Skills Desire for Improvement  ADL's:  Intact  Cognition: WNL  Sleep:  {BHH GOOD/FAIR/POOR:22877}   Screenings: QHU7-6  Flowsheet Row Video Visit from 10/12/2021 in [facility] Office Visit from 07/12/2021 in [facility] Video Visit from 02/11/2021 in [facility] Video Visit from 11/12/2020 in [facility] Office Visit from 12/13/2019 in [facility] Total Score 0 4 0 0 0  PHQ-9 Total Score -- 11 -- -- 6      Flowsheet Row Video Visit from 10/12/2021 in [facility] Video Visit from 07/21/2021 in [facility] Office Visit from 07/12/2021 in [facility]  C-SSRS RISK CATEGORY No Risk Error: Q3, 4, or 5 should not be populated when Q2 is No High Risk        Assessment and Plan:  Miranda Harris is a 61 y.o. 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    1. Bipolar 1 disorder (HCC) Acute stressors include: declining in the condition of her dog  Other stressors include:    History:   There has been a significant improvement in depressive symptoms since starting Trintellix.  She now wants to taper off quetiapine due to concern of reported REM sleep due to her not having any dreams, and  drowsiness.  Although she is advised to consider switching to medications such as Geodon or olanzapine, she is not interested in this.  After having extensive discussion, she agrees to lower the dose of quetiapine rather than tapering off this medication.  Discussed potential risk of relapse in bipolar disorder.  Will continue lamotrigine for bipolar disorder.  Will continue Trintellix for bipolar depression.    2. Insomnia, unspecified type Stable.  Although she had good benefit from quetiapine, she would like to reduce the dose as described above.      Plan:   Continue lamotrigine 50 mg daily  (dizziness from 100 mg )  Decrease quetiapine 125 mg at night  EKG. QTc 389 msec, HR 64 10/2022  Continue Trintellix 10 mg at night  Next appointment: 4/17 at 10 30 for 30 mins, video - thyroid is within wnl per patient 10/2022      Past trials of medication: Sertraline, fluoxetine (drowsiness), Lexapro,  Duloxetine, venlafaxine (initially worked well, stopped working, hair loss), bupropion (insomnia),  Paxil (urinary retention), Latuda, Abilify (flu like symptoms), Haldol (EPS), Thorazine (EPS), Stelazine,  Lithium, Lunesta (sedation), Ambien (sleepwalking, driving, eating), Trazodone (excessive sedation), Xanax    The patient demonstrates the following  risk factors for suicide: Chronic risk factors for suicide include psychiatric disorder /bipolar disorder, history of substance use disorder, family history of suicide attempt. Acute risk factors for suicide include none. Protective factors for this patient include positive social support, responsibility to others (family), coping skills, hope for the future. The overall suicide risk at this point appears to be low, and she is appropriate for outpatient follow up.   Collaboration of Care: Collaboration of Care: {BH OP Collaboration of Care:21014065}  Patient/Guardian was advised Release of Information must be obtained prior to any record release in order to  collaborate their care with an outside provider. Patient/Guardian was advised if they have not already done so to contact the registration department to sign all necessary forms in order for [place] to release information regarding their care.   Consent:  Patient/Guardian gives verbal consent for treatment and assignment of benefits for services provided during this visit. Patient/Guardian expressed understanding and agreed to proceed.    Neysa Hotter, MD 01/07/2023, 3:30 PM

## 2023-01-11 ENCOUNTER — Telehealth: Payer: Medicare Other | Admitting: Psychiatry

## 2023-01-22 NOTE — Progress Notes (Unsigned)
Virtual Visit via Video Note  I connected with Miranda Harris on 01/25/23 at  8:30 AM EDT by a video enabled telemedicine application and verified that I am speaking with the correct person using two identifiers.  Location: Patient: home Provider: office Persons participated in the visit- patient, provider    I discussed the limitations of evaluation and management by telemedicine and the availability of in person appointments. The patient expressed understanding and agreed to proceed.      I discussed the assessment and treatment plan with the patient. The patient was provided an opportunity to ask questions and all were answered. The patient agreed with the plan and demonstrated an understanding of the instructions.   The patient was advised to call back or seek an in-person evaluation if the symptoms worsen or if the condition fails to improve as anticipated.  I provided 26 minutes of non-face-to-face time during this encounter.   Neysa Hotter, MD    [facility] MD/PA/NP OP Progress Note  01/25/2023 9:11 AM Miranda Harris  MRN:  [id number]  Chief Complaint:  Chief Complaint  Patient presents with   Follow-up   HPI:  This is a follow-up appointment for bipolar 1 disorder and insomnia.  She apologized about the previous encounter.  She felt that she argued about quetiapine.  She was under stress at that time.  She states that she started ferritin, iron 5 mg every day after reading magazine about this appointment.  She states that her drowsiness has been much better.  She wished to have known this 10 years ago.  She has been stressed at work.  She works 30 to 35 hours/week.  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  She thinks she will have enough friends to be around with others.  She is concerned about insomnia.  She sleeps around 10 PM through 4 AM.  She will need more sleep.  She denies any racing thoughts, or feeling hyper.   She denies hallucinations, decreased need for sleep, or increased goal-directed activity.  She feels down due to the time condition of exertion.  She denies SI.  She agrees with the plan as below.  Substance use  Tobacco Alcohol Other substances/  Current  Denies- cannot drink denies  Past     Past Treatment        Wt Readings from Last 3 Encounters:  07/12/21 192 lb 12.8 oz (87.5 kg)  06/10/21 186 lb 14.4 oz (84.8 kg)  05/28/21 188 lb 14.4 oz (85.7 kg)     Component Ref Range & Units 4 mo ago  TSH 0.450 - 4.500 uIU/mL 0.230 Low    Visit Diagnosis:    ICD-10-CM   1. Bipolar 1 disorder (HCC)  F31.9     2. Insomnia, unspecified type  G47.00     3. Fatigue, unspecified type  R53.83 Ferritin    4. Iron deficiency  E61.1 Ferritin      Past Psychiatric History:Please see initial evaluation for full details. I have reviewed the history. No updates at this time.     Past Medical History:  Past Medical History:  Diagnosis Date   Bipolar disorder (HCC)    Blood transfusion without reported diagnosis    Chronic kidney disease    Depression    Elevated serum creatinine    related to lithium use   Helicobacter pylori (H. pylori) infection    pt denies   Hypothyroidism  Perirectal fistula    Thyroid disease    Trochanteric bursitis of right hip     Past Surgical History:  Procedure Laterality Date   COLONOSCOPY  01/06/2016   at [facility]   EVALUATION UNDER ANESTHESIA WITH HEMORRHOIDECTOMY N/A 06/28/2017   Procedure: EXAM UNDER ANESTHESIA WITH EXTERNAL HEMORRHOIDECTOMY AND REPAIR OF PERIRECTAL FISTULA;  Surgeon: Karie Soda, MD;  Location: [facility];  Service: General;  Laterality: N/A;   HERNIA REPAIR  8-10 yrs ago   umbilical hernia   INCISION AND DRAINAGE PERIRECTAL ABSCESS N/A 04/18/2017   Procedure: IRRIGATION AND DEBRIDEMENT PERIRECTAL ABSCESS, placement of seton, sigmoidoscopy;  Surgeon: Glenna Fellows, MD;  Location: [facility];  Service: General;   Laterality: N/A;   LAPAROSCOPIC APPENDECTOMY     POLYPECTOMY     VAGINAL HYSTERECTOMY Bilateral    complete   WISDOM TOOTH EXTRACTION      Family Psychiatric History: Please see initial evaluation for full details. I have reviewed the history. No updates at this time.     Family History:  Family History  Problem Relation Age of Onset   Anxiety disorder Brother    Breast cancer Neg Hx    Colon cancer Neg Hx    Colon polyps Neg Hx    Esophageal cancer Neg Hx    Rectal cancer Neg Hx    Stomach cancer Neg Hx     Social History:  Social History   Socioeconomic History   Marital status: Married    Spouse name: Not on file   Number of children: Not on file   Years of education: Not on file   Highest education level: Not on file  Occupational History   Not on file  Tobacco Use   Smoking status: Former    Packs/day: 1.00    Years: 30.00    Additional pack years: 0.00    Total pack years: 30.00    Types: Cigarettes   Smokeless tobacco: Never   Tobacco comments:    quit 2008  Vaping Use   Vaping Use: Never used  Substance and Sexual Activity   Alcohol use: Yes    Comment: once a month per pt   Drug use: No   Sexual activity: Yes    Partners: Male    Birth control/protection: Surgical  Other Topics Concern   Not on file  Social History Narrative   Not on file   Social Determinants of Health   Financial Resource Strain: Not on file  Food Insecurity: Not on file  Transportation Needs: Not on file  Physical Activity: Not on file  Stress: Not on file  Social Connections: Not on file    Allergies:  Allergies  Allergen Reactions   Metrogel [Metronidazole] Other (See Comments)    Burn face   Tetracyclines & Related Rash    Metabolic Disorder Labs: Lab Results  Component Value Date   HGBA1C 5.5 09/04/2020   MPG 108 06/10/2015   No results found for: "PROLACTIN" Lab Results  Component Value Date   CHOL 205 (H) 12/25/2020   TRIG 76.0 12/25/2020   HDL  73.30 12/25/2020   CHOLHDL 3 12/25/2020   VLDL 15.2 12/25/2020   LDLCALC 116 (H) 12/25/2020   LDLCALC 122 (H) 09/04/2020   Lab Results  Component Value Date   TSH 0.05 (L) 05/28/2021   TSH 1.32 03/08/2021    Therapeutic Level Labs: Lab Results  Component Value Date   LITHIUM 1.2 11/08/2018   LITHIUM 0.80 06/10/2015   No  results found for: "VALPROATE" No results found for: "CBMZ"  Current Medications: Current Outpatient Medications  Medication Sig Dispense Refill   QUEtiapine (SEROQUEL) 25 MG tablet Take 1 tablet (25 mg total) by mouth at bedtime. Total of 175 mg at night. Take along with 100 mg, 50 mg tab 90 tablet 0   Calcium Carbonate-Vitamin D (CALTRATE 600+D PO) Take 2 tablets by mouth daily.     lamoTRIgine (LAMICTAL) 25 MG tablet Take 2 tablets (50 mg total) by mouth daily. 180 tablet 0   levothyroxine (SYNTHROID) 150 MCG tablet Take 1 tablet (150 mcg total) by mouth daily. 90 tablet 1   levothyroxine (SYNTHROID) 175 MCG tablet TAKE 1 TABLET BY MOUTH EVERY DAY 90 tablet 0   Multiple Vitamin (MULTIVITAMIN WITH MINERALS) TABS tablet Take 1 tablet by mouth daily.     Omega-3 Fatty Acids (FISH OIL) 1000 MG CAPS Take 2,000 mg by mouth daily.     QUEtiapine (SEROQUEL) 100 MG tablet Take 1 tablet (100 mg total) by mouth at bedtime. Total of 150 mg at night. Take along with 50 mg tab 90 tablet 1   QUEtiapine (SEROQUEL) 50 MG tablet Take 1 tablet (50 mg total) by mouth at bedtime. Total of 150 mg at night. Take along with 100 mg tab 90 tablet 1   Vitamin D, Ergocalciferol, (DRISDOL) 1.25 MG (50000 UNIT) CAPS capsule Take 1 capsule (50,000 Units total) by mouth every 7 (seven) days. 12 capsule 0   vortioxetine HBr (TRINTELLIX) 10 MG TABS tablet 5 mg at night for one week, then 10 mg at night 30 tablet 1   vortioxetine HBr (TRINTELLIX) 10 MG TABS tablet Take 1 tablet (10 mg total) by mouth daily. 90 tablet 0   No current facility-administered medications for this visit.      Musculoskeletal: Strength & Muscle Tone:  N/A Gait & Station:  N/A Patient leans: N/A  Psychiatric Specialty Exam: Review of Systems  Psychiatric/Behavioral:  Positive for dysphoric mood and sleep disturbance. Negative for agitation, behavioral problems, confusion, decreased concentration, hallucinations, self-injury and suicidal ideas. The patient is not nervous/anxious and is not hyperactive.   All other systems reviewed and are negative.   There were no vitals taken for this visit.There is no height or weight on file to calculate BMI.  General Appearance: Fairly Groomed  Eye Contact:  Good  Speech:  Clear and Coherent  Volume:  Normal  Mood:   tired  Affect:  Appropriate, Congruent, and fatigue  Thought Process:  Coherent  Orientation:  Full (Time, Place, and Person)  Thought Content: Logical   Suicidal Thoughts:  No  Homicidal Thoughts:  No  Memory:  Immediate;   Good  Judgement:  Good  Insight:  Good  Psychomotor Activity:  Normal  Concentration:  Concentration: Good and Attention Span: Good  Recall:  Good  Fund of Knowledge: Good  Language: Good  Akathisia:  No  Handed:  Right  AIMS (if indicated): not done  Assets:  Communication Skills Desire for Improvement  ADL's:  Intact  Cognition: WNL  Sleep:  Poor   Screenings: PHQ2-9    Flowsheet Row Video Visit from 10/12/2021 in [facility] Office Visit from 07/12/2021 in [facility] Video Visit from 02/11/2021 in [facility] Video Visit from 11/12/2020 in [facility] Office Visit from 12/13/2019 in [facility] Total Score 0 4 0 0 0  PHQ-9 Total Score -- 11 -- -- 6      Flowsheet Row Video Visit from 10/12/2021 in [facility] Video Visit from 07/21/2021 in [facility] Office Visit from 07/12/2021 in [facility]  C-SSRS RISK CATEGORY No Risk Error: Q3, 4, or 5 should not be populated when Q2 is No High Risk        Assessment and Plan:  Miranda Harris is a 62 y.o. 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   1. Bipolar 1 disorder (HCC) 2. Insomnia, unspecified type Acute stressors include: declining in the condition of her dog, work related stress  Other stressors include:    History: admitted at age 38 due to mania, psychosis, depression, another episode at age 64 due to non adherence. Originally on lithium 300 mg QID, trifluoperazine 2 mg qhs, lexapro 20 mg daily, xanax 0.5 mg qhsprn   She reports mild depressive symptoms, which she attributes to exhaustion secondary to work, and reports worsening in insomnia.  Will uptitrate quetiapine to optimize treatment for bipolar depression and insomnia.  Discussed potential risk of drowsiness.  Will continue to monitor EPS, metabolic side effect.  Will continue lamotrigine and Trintellix for bipolar depression.   3. Fatigue, unspecified type 4. Iron deficiency She reports significant improvement in fatigue since starting to take iron tablet.  Will obtain lab to assess her ferritin level.  She has no anemia on 08/2022.  She mentions that her thyroid and vitamin D levels have been evaluated, and she plans to follow up with her healthcare providers accordingly.   Plan:   Continue lamotrigine 50 mg daily  (dizziness from 100 mg ) - 50 tabs left Increase quetiapine 175 mg at night  EKG. QTc 389 msec, HR 64 10/2022  Continue Trintellix 10 mg at night  Next appointment: 6/11 at 11 30 for 30 mins, video Obtain labs - ferritin  - thyroid is within wnl per patient  10/2022 - [facility] - vitamin D was within limits 2023    Past trials of medication: Sertraline, fluoxetine (drowsiness), Lexapro,  Duloxetine, venlafaxine (initially worked well, stopped working, hair loss), bupropion (insomnia),  Paxil (urinary retention), Latuda, Abilify (flu like symptoms), Haldol (EPS), Thorazine (EPS), Stelazine,  Lithium, Lunesta (sedation), Ambien (sleepwalking, driving, eating), Trazodone (excessive sedation), Xanax    The patient demonstrates the following  risk factors for suicide: Chronic risk factors for suicide include psychiatric disorder /bipolar disorder, history of substance use disorder, family history of suicide attempt. Acute risk factors for suicide include none. Protective factors for this patient include positive social support, responsibility to others (family), coping skills, hope for the future. The overall suicide risk at this point appears to be low, and she is appropriate for outpatient follow up.   Collaboration of Care: Collaboration of Care: Other reviewed notes in Epic  Patient/Guardian was advised Release of Information must be obtained prior to any record release in order to collaborate their care with an outside provider. Patient/Guardian was advised if they have not already done so to contact the registration department to sign all necessary forms in order for [place] to release information regarding their care.   Consent: Patient/Guardian gives verbal consent for treatment and assignment of benefits for services provided during this visit. Patient/Guardian expressed understanding and agreed to  proceed.    Neysa Hotter, MD 01/25/2023, 9:11 AM

## 2023-01-25 ENCOUNTER — Encounter: Payer: Self-pay | Admitting: Psychiatry

## 2023-01-25 ENCOUNTER — Telehealth (INDEPENDENT_AMBULATORY_CARE_PROVIDER_SITE_OTHER): Payer: 59 | Admitting: Psychiatry

## 2023-01-25 DIAGNOSIS — R5383 Other fatigue: Secondary | ICD-10-CM | POA: Diagnosis not present

## 2023-01-25 DIAGNOSIS — F319 Bipolar disorder, unspecified: Secondary | ICD-10-CM | POA: Diagnosis not present

## 2023-01-25 DIAGNOSIS — E611 Iron deficiency: Secondary | ICD-10-CM | POA: Diagnosis not present

## 2023-01-25 DIAGNOSIS — G4700 Insomnia, unspecified: Secondary | ICD-10-CM | POA: Diagnosis not present

## 2023-01-25 MED ORDER — VORTIOXETINE HBR 10 MG PO TABS: 10.0000 mg | ORAL_TABLET | Freq: Every day | ORAL | 0 refills | Status: AC

## 2023-01-25 MED ORDER — QUETIAPINE FUMARATE 25 MG PO TABS: 25.0000 mg | ORAL_TABLET | Freq: Every day | ORAL | 0 refills | Status: DC

## 2023-01-25 NOTE — Patient Instructions (Signed)
Continue lamotrigine 50 mg daily  Increase quetiapine 175 mg at night  Continue Trintellix 10 mg at night  Next appointment: 6/11 at 11 30 for 30 mins, video Obtain labs - ferritin

## 2023-02-24 ENCOUNTER — Other Ambulatory Visit: Payer: Self-pay | Admitting: Psychiatry

## 2023-02-27 ENCOUNTER — Other Ambulatory Visit: Payer: Self-pay | Admitting: Psychiatry

## 2023-02-27 MED ORDER — LAMOTRIGINE 25 MG PO TABS: 50.0000 mg | ORAL_TABLET | Freq: Every day | ORAL | 0 refills | Status: DC

## 2023-02-27 NOTE — Telephone Encounter (Signed)
pt left a message that she needs a refill on the lamictal. pt was last seen on 5-1 next appt 6-12    Disp Refills Start End   lamoTRIgine (LAMICTAL) 25 MG tablet 180 tablet 0 10/09/2022 01/07/2023   Sig - Route: Take 2 tablets (50 mg total) by mouth daily. - Oral   Sent to pharmacy as: lamoTRIgine (LAMICTAL) 25 MG tablet   Notes to Pharmacy: To be started after 1/14   E-Prescribing Status: Receipt confirmed by pharmacy (08/26/2022  9:32 AM EST)

## 2023-02-27 NOTE — Telephone Encounter (Signed)
Ordered

## 2023-03-03 LAB — FERRITIN: Ferritin: 148 ng/mL (ref 15–150)

## 2023-03-05 ENCOUNTER — Encounter: Payer: Self-pay | Admitting: Psychiatry

## 2023-03-05 NOTE — Progress Notes (Unsigned)
Virtual Visit via Video Note  I connected with Miranda Harris on 03/08/23 at  2:30 PM EDT by a video enabled telemedicine application and verified that I am speaking with the correct person using two identifiers.  Location: Patient: home Provider: office Persons participated in the visit- patient, provider    I discussed the limitations of evaluation and management by telemedicine and the availability of in person appointments. The patient expressed understanding and agreed to proceed.    I discussed the assessment and treatment plan with the patient. The patient was provided an opportunity to ask questions and all were answered. The patient agreed with the plan and demonstrated an understanding of the instructions.   The patient was advised to call back or seek an in-person evaluation if the symptoms worsen or if the condition fails to improve as anticipated.  I provided 25 minutes of non-face-to-face time during this encounter.   Miranda Hotter, MD    [facility] MD/PA/NP OP Progress Note  03/08/2023 3:07 PM Miranda Harris  MRN:  [id number]  Chief Complaint:  Chief Complaint  Patient presents with   Follow-up   HPI:  This is a follow-up appointment for bipolar 1 disorder and insomnia.  She states that she has been very busy, doing yard work.  She enjoys this, and she also enjoys meeting with neighbors and doing cookout.  Although she misses people as she is social, she has been doing good with this.  She occasionally misses her father.  She has a plan to travel to [place]. Her son will be visiting her in a few weeks. She is looking forward to these.  She is also planning to take courses for data entry so that she can do part-time job.  She sleeps much better since uptitration of quetiapine.  She denies any drowsiness during the day.  She denies change in appetite.  Although she may feel down from time to time, it has been manageable.  She denies SI.  She denies decreased need for  sleep or euphoria.  She feels comfortable to stay on the medication as at this.    Substance use  Tobacco Alcohol Other substances/  Current  denies denies  Past  Used to drink a good amount when she was in college Pot, cocaine, occasionally when she was in college, high school  Past Treatment        187 lbs Wt Readings from Last 3 Encounters:  07/12/21 192 lb 12.8 oz (87.5 kg)  06/10/21 186 lb 14.4 oz (84.8 kg)  05/28/21 188 lb 14.4 oz (85.7 kg)       Visit Diagnosis:    ICD-10-CM   1. Bipolar 1 disorder (HCC)  F31.9     2. Insomnia, unspecified type  G47.00       Past Psychiatric History: Please see initial evaluation for full details. I have reviewed the history. No updates at this time.     Past Medical History:  Past Medical History:  Diagnosis Date   Bipolar disorder (HCC)    Blood transfusion without reported diagnosis    Chronic kidney disease    Depression    Elevated serum creatinine    related to lithium use   Helicobacter pylori (H. pylori) infection    pt denies   Hypothyroidism    Perirectal fistula    Thyroid disease    Trochanteric bursitis of right hip     Past Surgical History:  Procedure Laterality Date   COLONOSCOPY  01/06/2016  at [facility]   EVALUATION UNDER ANESTHESIA WITH HEMORRHOIDECTOMY N/A 06/28/2017   Procedure: EXAM UNDER ANESTHESIA WITH EXTERNAL HEMORRHOIDECTOMY AND REPAIR OF PERIRECTAL FISTULA;  Surgeon: Karie Soda, MD;  Location: [facility];  Service: General;  Laterality: N/A;   HERNIA REPAIR  8-10 yrs ago   umbilical hernia   INCISION AND DRAINAGE PERIRECTAL ABSCESS N/A 04/18/2017   Procedure: IRRIGATION AND DEBRIDEMENT PERIRECTAL ABSCESS, placement of seton, sigmoidoscopy;  Surgeon: Glenna Fellows, MD;  Location: [facility];  Service: General;  Laterality: N/A;   LAPAROSCOPIC APPENDECTOMY     POLYPECTOMY     VAGINAL HYSTERECTOMY Bilateral    complete   WISDOM TOOTH EXTRACTION      Family Psychiatric  History: Please see initial evaluation for full details. I have reviewed the history. No updates at this time.     Family History:  Family History  Problem Relation Age of Onset   Anxiety disorder Brother    Breast cancer Neg Hx    Colon cancer Neg Hx    Colon polyps Neg Hx    Esophageal cancer Neg Hx    Rectal cancer Neg Hx    Stomach cancer Neg Hx     Social History:  Social History   Socioeconomic History   Marital status: Married    Spouse name: Not on file   Number of children: Not on file   Years of education: Not on file   Highest education level: Not on file  Occupational History   Not on file  Tobacco Use   Smoking status: Former    Packs/day: 1.00    Years: 30.00    Additional pack years: 0.00    Total pack years: 30.00    Types: Cigarettes   Smokeless tobacco: Never   Tobacco comments:    quit 2008  Vaping Use   Vaping Use: Never used  Substance and Sexual Activity   Alcohol use: Yes    Comment: once a month per pt   Drug use: No   Sexual activity: Yes    Partners: Male    Birth control/protection: Surgical  Other Topics Concern   Not on file  Social History Narrative   Not on file   Social Determinants of Health   Financial Resource Strain: Not on file  Food Insecurity: Not on file  Transportation Needs: Not on file  Physical Activity: Not on file  Stress: Not on file  Social Connections: Not on file    Allergies:  Allergies  Allergen Reactions   Metrogel [Metronidazole] Other (See Comments)    Burn face   Tetracyclines & Related Rash    Metabolic Disorder Labs: Lab Results  Component Value Date   HGBA1C 5.5 09/04/2020   MPG 108 06/10/2015   No results found for: "PROLACTIN" Lab Results  Component Value Date   CHOL 205 (H) 12/25/2020   TRIG 76.0 12/25/2020   HDL 73.30 12/25/2020   CHOLHDL 3 12/25/2020   VLDL 15.2 12/25/2020   LDLCALC 116 (H) 12/25/2020   LDLCALC 122 (H) 09/04/2020   Lab Results  Component Value Date    TSH 0.05 (L) 05/28/2021   TSH 1.32 03/08/2021    Therapeutic Level Labs: Lab Results  Component Value Date   LITHIUM 1.2 11/08/2018   LITHIUM 0.80 06/10/2015   No results found for: "VALPROATE" No results found for: "CBMZ"  Current Medications: Current Outpatient Medications  Medication Sig Dispense Refill   Calcium Carbonate-Vitamin D (CALTRATE 600+D PO) Take 2 tablets by mouth  daily.     lamoTRIgine (LAMICTAL) 25 MG tablet Take 2 tablets (50 mg total) by mouth daily. 180 tablet 0   levothyroxine (SYNTHROID) 150 MCG tablet Take 1 tablet (150 mcg total) by mouth daily. 90 tablet 1   levothyroxine (SYNTHROID) 175 MCG tablet TAKE 1 TABLET BY MOUTH EVERY DAY 90 tablet 0   Multiple Vitamin (MULTIVITAMIN WITH MINERALS) TABS tablet Take 1 tablet by mouth daily.     Omega-3 Fatty Acids (FISH OIL) 1000 MG CAPS Take 2,000 mg by mouth daily.     QUEtiapine (SEROQUEL) 100 MG tablet Take 1 tablet (100 mg total) by mouth at bedtime. Total of 175 mg at night. Take along with 50 mg and 25 mg tab 90 tablet 1   QUEtiapine (SEROQUEL) 25 MG tablet Take 1 tablet (25 mg total) by mouth at bedtime. Total of 175 mg at night. Take along with 100 mg, 50 mg tab 90 tablet 0   QUEtiapine (SEROQUEL) 50 MG tablet Take 1 tablet (50 mg total) by mouth at bedtime. Total of 175 mg at night. Take along with 100 mg and 50 mg tab 90 tablet 1   Vitamin D, Ergocalciferol, (DRISDOL) 1.25 MG (50000 UNIT) CAPS capsule Take 1 capsule (50,000 Units total) by mouth every 7 (seven) days. 12 capsule 0   vortioxetine HBr (TRINTELLIX) 10 MG TABS tablet 5 mg at night for one week, then 10 mg at night 30 tablet 1   vortioxetine HBr (TRINTELLIX) 10 MG TABS tablet Take 1 tablet (10 mg total) by mouth daily. 90 tablet 0   No current facility-administered medications for this visit.     Musculoskeletal: Strength & Muscle Tone:  N/A Gait & Station:  N/A Patient leans: N/A  Psychiatric Specialty Exam: Review of Systems   Psychiatric/Behavioral:  Negative for agitation, behavioral problems, confusion, decreased concentration, dysphoric mood, hallucinations, self-injury, sleep disturbance and suicidal ideas. The patient is not nervous/anxious and is not hyperactive.   All other systems reviewed and are negative.   There were no vitals taken for this visit.There is no height or weight on file to calculate BMI.  General Appearance: Fairly Groomed  Eye Contact:  Good  Speech:  Clear and Coherent  Volume:  Normal  Mood:   good  Affect:  Appropriate, Congruent, and Full Range  Thought Process:  Coherent  Orientation:  Full (Time, Place, and Person)  Thought Content: Logical   Suicidal Thoughts:  No  Homicidal Thoughts:  No  Memory:  Immediate;   Good  Judgement:  Good  Insight:  Good  Psychomotor Activity:  Normal  Concentration:  Concentration: Good and Attention Span: Good  Recall:  Good  Fund of Knowledge: Good  Language: Good  Akathisia:  No  Handed:  Right  AIMS (if indicated): not done  Assets:  Communication Skills Desire for Improvement  ADL's:  Intact  Cognition: WNL  Sleep:  Good   Screenings: PHQ2-9    Flowsheet Row Video Visit from 10/12/2021 in [facility] Office Visit from 07/12/2021 in [facility] Video Visit from 02/11/2021 in [facility] Video Visit from 11/12/2020 in [facility] Office Visit from 12/13/2019 in [facility] [place]  PHQ-2 Total Score 0 4 0 0 0  PHQ-9 Total Score -- 11 -- -- 6      Flowsheet Row Video Visit from 10/12/2021 in [facility] Video Visit  from 07/21/2021 in [facility] Office Visit from 07/12/2021 in [facility]  C-SSRS RISK CATEGORY No Risk Error: Q3,  4, or 5 should not be populated when Q2 is No High Risk        Assessment and Plan:  Lamija Drucker is a 62 y.o. 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   1. Bipolar 1 disorder (HCC) 2. Insomnia, unspecified type Acute stressors include: declining in the condition of her dog, work related stress  Other stressors include:    History: admitted at age 23 due to mania, psychosis, depression, another episode at age 52 due to non adherence. Originally on lithium 300 mg QID, trifluoperazine 2 mg qhs, lexapro 20 mg daily, xanax 0.5 mg qhsprn    Significant improvement in insomnia, and improvement in depressive symptoms since uptitration of quetiapine.  Will continue current medication regimen.  Will continue lamotrigine, Trintellix for bipolar depression.  Will continue quetiapine for bipolar depression and insomnia.    3. Fatigue, unspecified type 4. Iron deficiency There has been a steady improvement in fatigue since starting to take iron tablet/above intervention. She mentions that her thyroid and vitamin D levels have been evaluated, and she plans to follow up with her healthcare providers accordingly.   Plan:   Continue lamotrigine 50 mg daily  (dizziness from 100 mg ) - she declined a refill Continue quetiapine 175 mg at night  EKG. QTc 389 msec, HR 64 10/2022  Continue Trintellix 10 mg at night  Next appointment: 8/2 at 10 am for 30 mins, video  - thyroid is within wnl per patient 10/2022 - [facility] - vitamin D was within limits 2023    Past trials of medication: Sertraline, fluoxetine (drowsiness), Lexapro,  Duloxetine, venlafaxine (initially worked well, stopped working, hair loss), bupropion (insomnia),  Paxil (urinary retention), Latuda, Abilify (flu like symptoms), Haldol (EPS), Thorazine (EPS),  Stelazine,  Lithium, Lunesta (sedation), Ambien (sleepwalking, driving, eating), Trazodone (excessive sedation), Xanax    The patient demonstrates the following  risk factors for suicide: Chronic risk factors for suicide include psychiatric disorder /bipolar disorder, history of substance use disorder, family history of suicide attempt. Acute risk factors for suicide include none. Protective factors for this patient include positive social support, responsibility to others (family), coping skills, hope for the future. The overall suicide risk at this point appears to be low, and she is appropriate for outpatient follow up.   Collaboration of Care: Collaboration of Care: Other reviewed notes in Epic  Patient/Guardian was advised Release of Information must be obtained prior to any record release in order to collaborate their care with an outside provider. Patient/Guardian was advised if they have not already done so to contact the registration department to sign all necessary forms in order for [place] to release information regarding their care.   Consent: Patient/Guardian gives verbal consent for treatment and assignment of benefits for services provided during this visit. Patient/Guardian expressed understanding and agreed to proceed.    Miranda Hotter, MD 03/08/2023, 3:07 PM

## 2023-03-08 ENCOUNTER — Encounter: Payer: Self-pay | Admitting: Psychiatry

## 2023-03-08 ENCOUNTER — Telehealth (INDEPENDENT_AMBULATORY_CARE_PROVIDER_SITE_OTHER): Payer: 59 | Admitting: Psychiatry

## 2023-03-08 DIAGNOSIS — F319 Bipolar disorder, unspecified: Secondary | ICD-10-CM | POA: Diagnosis not present

## 2023-03-08 DIAGNOSIS — G4700 Insomnia, unspecified: Secondary | ICD-10-CM | POA: Diagnosis not present

## 2023-03-08 MED ORDER — QUETIAPINE FUMARATE 50 MG PO TABS: 50.0000 mg | ORAL_TABLET | Freq: Every day | ORAL | 1 refills | Status: DC

## 2023-03-08 MED ORDER — QUETIAPINE FUMARATE 100 MG PO TABS: 100.0000 mg | ORAL_TABLET | Freq: Every day | ORAL | 1 refills | Status: DC

## 2023-03-14 ENCOUNTER — Other Ambulatory Visit: Payer: Self-pay | Admitting: Psychiatry

## 2023-04-21 ENCOUNTER — Other Ambulatory Visit: Payer: Self-pay | Admitting: Psychiatry

## 2023-04-24 NOTE — Progress Notes (Signed)
Virtual Visit via Video Note  I connected with Miranda Harris on 04/28/23 at 10:00 AM EDT by a video enabled telemedicine application and verified that I am speaking with the correct person using two identifiers.  Location: Patient: home Provider: office Persons participated in the visit- patient, provider    I discussed the limitations of evaluation and management by telemedicine and the availability of in person appointments. The patient expressed understanding and agreed to proceed.     I discussed the assessment and treatment plan with the patient. The patient was provided an opportunity to ask questions and all were answered. The patient agreed with the plan and demonstrated an understanding of the instructions.   The patient was advised to call back or seek an in-person evaluation if the symptoms worsen or if the condition fails to improve as anticipated.  I provided 15 minutes of non-face-to-face time during this encounter.   Neysa Hotter, MD    [facility] MD/PA/NP OP Progress Note  04/28/2023 10:13 AM Miranda Harris  MRN:  [id number]  Chief Complaint:  Chief Complaint  Patient presents with   Follow-up   HPI:  This is a follow-up appointment for bipolar 1 disorder, insomnia and fatigue.  She states that she will be going to [place] with her husband, brother and others.  She is looking forward to it.  She had a great visit from her son, who lives in [place].  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  She is planning to take a glass to get certificate.  She feels tired of doing retail, dealing with public.  Although she has difficulty in finding connection with her neighbors due to difference in the culture, which is family oriented, she is trying to accept this.  She tends to feel sad when she is bored.  She takes a walk 3 times a week at the park, and has been trying to be active.  She sleeps well without drowsiness.  She denies feeling depressed or anxiety.  She  denies SI.  She denies alcohol use or drug use.  She notices that she tends to feel fatigue when she tried to take iron pill every other day. She has good effect by taking 2/3 every day and does not have constipation with that dose.   Substance use   Tobacco Alcohol Other substances/  Current   Denies- cannot drink denies  Past   Used to drink a good amount when she was in college Pot, cocaine, occasionally when she was in college, high school  Past Treatment           Visit Diagnosis:    ICD-10-CM   1. Bipolar 1 disorder (HCC)  F31.9     2. Insomnia, unspecified type  G47.00     3. Fatigue, unspecified type  R53.83     4. Iron deficiency  E61.1       Past Psychiatric History: Please see initial evaluation for full details. I have reviewed the history. No updates at this time.     Past Medical History:  Past Medical History:  Diagnosis Date   Bipolar disorder (HCC)    Blood transfusion without reported diagnosis    Chronic kidney disease    Depression    Elevated serum creatinine    related to lithium use   Helicobacter pylori (H. pylori) infection    pt denies   Hypothyroidism    Perirectal fistula    Thyroid disease    Trochanteric  bursitis of right hip     Past Surgical History:  Procedure Laterality Date   COLONOSCOPY  01/06/2016   at [facility]   EVALUATION UNDER ANESTHESIA WITH HEMORRHOIDECTOMY N/A 06/28/2017   Procedure: EXAM UNDER ANESTHESIA WITH EXTERNAL HEMORRHOIDECTOMY AND REPAIR OF PERIRECTAL FISTULA;  Surgeon: Karie Soda, MD;  Location: [facility];  Service: General;  Laterality: N/A;   HERNIA REPAIR  8-10 yrs ago   umbilical hernia   INCISION AND DRAINAGE PERIRECTAL ABSCESS N/A 04/18/2017   Procedure: IRRIGATION AND DEBRIDEMENT PERIRECTAL ABSCESS, placement of seton, sigmoidoscopy;  Surgeon: Glenna Fellows, MD;  Location: [facility];  Service: General;  Laterality: N/A;   LAPAROSCOPIC APPENDECTOMY     POLYPECTOMY     VAGINAL HYSTERECTOMY  Bilateral    complete   WISDOM TOOTH EXTRACTION      Family Psychiatric History: Please see initial evaluation for full details. I have reviewed the history. No updates at this time.    Family History:  Family History  Problem Relation Age of Onset   Anxiety disorder Brother    Breast cancer Neg Hx    Colon cancer Neg Hx    Colon polyps Neg Hx    Esophageal cancer Neg Hx    Rectal cancer Neg Hx    Stomach cancer Neg Hx     Social History:  Social History   Socioeconomic History   Marital status: Married    Spouse name: Not on file   Number of children: Not on file   Years of education: Not on file   Highest education level: Not on file  Occupational History   Not on file  Tobacco Use   Smoking status: Former    Current packs/day: 1.00    Average packs/day: 1 pack/day for 30.0 years (30.0 ttl pk-yrs)    Types: Cigarettes   Smokeless tobacco: Never   Tobacco comments:    quit 2008  Vaping Use   Vaping status: Never Used  Substance and Sexual Activity   Alcohol use: Yes    Comment: once a month per pt   Drug use: No   Sexual activity: Yes    Partners: Male    Birth control/protection: Surgical  Other Topics Concern   Not on file  Social History Narrative   Not on file   Social Determinants of Health   Financial Resource Strain: Low Risk  (11/22/2022)   Received from [facility], [facility]   Overall Financial Resource Strain (CARDIA)    Difficulty of Paying Living Expenses: Not hard at all  Food Insecurity: No Food Insecurity (11/22/2022)   Received from [facility], [facility]   Hunger Vital Sign    Worried About Running Out of Food in the Last Year: Never true    Ran Out of Food in the Last Year: Never true  Transportation Needs: No Transportation Needs (11/22/2022)   Received [facility], [facility]   PRAPARE - Transportation    Lack of Transportation (Medical): No    Lack of Transportation (Non-Medical): No  Physical Activity: Not  on file  Stress: Not on file  Social Connections: Unknown (01/27/2022)   Received from [facility], [facility]   Social Network    Social Network: Not on file    Allergies:  Allergies  Allergen Reactions   Metrogel [Metronidazole] Other (See Comments)    Burn face   Tetracyclines & Related Rash    Metabolic Disorder Labs: Lab Results  Component Value Date   HGBA1C  5.5 09/04/2020   MPG 108 06/10/2015   No results found for: "PROLACTIN" Lab Results  Component Value Date   CHOL 205 (H) 12/25/2020   TRIG 76.0 12/25/2020   HDL 73.30 12/25/2020   CHOLHDL 3 12/25/2020   VLDL 15.2 12/25/2020   LDLCALC 116 (H) 12/25/2020   LDLCALC 122 (H) 09/04/2020   Lab Results  Component Value Date   TSH 0.05 (L) 05/28/2021   TSH 1.32 03/08/2021    Therapeutic Level Labs: Lab Results  Component Value Date   LITHIUM 1.2 11/08/2018   LITHIUM 0.80 06/10/2015   No results found for: "VALPROATE" No results found for: "CBMZ"  Current Medications: Current Outpatient Medications  Medication Sig Dispense Refill   Calcium Carbonate-Vitamin D (CALTRATE 600+D PO) Take 2 tablets by mouth daily.     [START ON 05/28/2023] lamoTRIgine (LAMICTAL) 25 MG tablet Take 2 tablets (50 mg total) by mouth daily. 180 tablet 1   levothyroxine (SYNTHROID) 150 MCG tablet Take 1 tablet (150 mcg total) by mouth daily. 90 tablet 1   levothyroxine (SYNTHROID) 175 MCG tablet TAKE 1 TABLET BY MOUTH EVERY DAY 90 tablet 0   Multiple Vitamin (MULTIVITAMIN WITH MINERALS) TABS tablet Take 1 tablet by mouth daily.     Omega-3 Fatty Acids (FISH OIL) 1000 MG CAPS Take 2,000 mg by mouth daily.     QUEtiapine (SEROQUEL) 100 MG tablet Take 1 tablet (100 mg total) by mouth at bedtime. Total of 175 mg at night. Take along with 50 mg and 25 mg tab 90 tablet 1   QUEtiapine (SEROQUEL) 25 MG tablet Take 1 tablet (25 mg total) by mouth at bedtime. Total of 175 mg at night. Take along with 100 mg, 50 mg tab 90 tablet 1   QUEtiapine  (SEROQUEL) 50 MG tablet Take 1 tablet (50 mg total) by mouth at bedtime. Total of 175 mg at night. Take along with 100 mg and 50 mg tab 90 tablet 1   Vitamin D, Ergocalciferol, (DRISDOL) 1.25 MG (50000 UNIT) CAPS capsule Take 1 capsule (50,000 Units total) by mouth every 7 (seven) days. 12 capsule 0   vortioxetine HBr (TRINTELLIX) 10 MG TABS tablet Take 1 tablet (10 mg total) by mouth daily. 90 tablet 0   No current facility-administered medications for this visit.     Musculoskeletal: Strength & Muscle Tone:  N/A Gait & Station:  N/A Patient leans: N/A  Psychiatric Specialty Exam: Review of Systems  Hematological: Negative.   All other systems reviewed and are negative.   There were no vitals taken for this visit.There is no height or weight on file to calculate BMI.  General Appearance: Fairly Groomed  Eye Contact:  Good  Speech:  Clear and Coherent  Volume:  Normal  Mood:   good  Affect:  Appropriate, Congruent, and Full Range  Thought Process:  Coherent  Orientation:  Full (Time, Place, and Person)  Thought Content: Logical   Suicidal Thoughts:  No  Homicidal Thoughts:  No  Memory:  Immediate;   Good  Judgement:  Good  Insight:  Good  Psychomotor Activity:  Normal  Concentration:  Concentration: Good and Attention Span: Good  Recall:  Good  Fund of Knowledge: Good  Language: Good  Akathisia:  No  Handed:  Right  AIMS (if indicated): not done  Assets:  Communication Skills Desire for Improvement  ADL's:  Intact  Cognition: WNL  Sleep:  Good   Screenings: PHQ2-9    Flowsheet Row Video Visit from 10/12/2021 in  [facility] Office Visit from 07/12/2021 in [facility] Video Visit from 02/11/2021 in [facility] Video Visit from 11/12/2020 in [facility] Office Visit from 12/13/2019 in [facility]  [facility] Total Score 0 4 0 0 0  PHQ-9 Total Score -- 11 -- -- 6      Flowsheet Row Video Visit from 10/12/2021 in [facility] Video Visit from 07/21/2021 in [facility] Office Visit from 07/12/2021 in [facility]  C-SSRS RISK CATEGORY No Risk Error: Q3, 4, or 5 should not be populated when Q2 is No High Risk        Assessment and Plan:  Miranda Harris is a 62 y.o. 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   1. Bipolar 1 disorder (HCC) 2. Insomnia, unspecified type Acute stressors include: declining in the condition of her dog, work related stress  Other stressors include:    History: admitted at age 14 due to mania, psychosis, depression, another episode at age 3 due to non adherence. Originally on lithium 300 mg QID, trifluoperazine 2 mg qhs, lexapro 20 mg daily, xanax 0.5 mg qhsprn    There has been a steady improvement in depressive symptoms, insomnia since uptitration of quetiapine, and she has significant benefit from this medication.  Will continue current dose to target bipolar depression, insomnia.  Will continue current dose of lamotrigine, Trintellix for bipolar depression.   3. Fatigue, unspecified type 4. Iron deficiency Overall improvement since she is on iron.  She is now taking 2/3 of pill to mitigate risk of constipation.  Will plan to reassess in several months for monitoring. She mentions that her thyroid and vitamin D levels have been evaluated, and she plans to follow up with her healthcare providers accordingly.   Plan:   Continue lamotrigine 50 mg daily  (dizziness from 100 mg ) - she declined a refill Continue quetiapine 175 mg at night  EKG.  QTc 389 msec, HR 64 10/2022  Continue Trintellix 10 mg at night  Next appointment: 10/25 at 10 am for 30 mins, video   - thyroid is within wnl per patient 10/2022 - [facility] - vitamin D was within limits 2023    Past trials of medication: Sertraline, fluoxetine (drowsiness), Lexapro,  Duloxetine, venlafaxine (initially worked well, stopped working, hair loss), bupropion (insomnia),  Paxil (urinary retention), Latuda, Abilify (flu like symptoms), Haldol (EPS), Thorazine (EPS), Stelazine,  Lithium, Lunesta (sedation), Ambien (sleepwalking, driving, eating), Trazodone (excessive sedation), Xanax    The patient demonstrates the following  risk factors for suicide: Chronic risk factors for suicide include psychiatric disorder /bipolar disorder, history of substance use disorder, family history of suicide attempt. Acute risk factors for suicide include none. Protective factors for this patient include positive social support, responsibility to others (family), coping skills, hope for the future. The overall suicide risk at this point appears to be low, and she is appropriate for outpatient follow up.   Collaboration of Care: Collaboration of Care: Other reviewed notes in Epic  Patient/Guardian was advised Release of Information must be obtained prior to any record release in order to collaborate their care with an outside provider. Patient/Guardian was advised if they have  not already done so to contact the registration department to sign all necessary forms in order for [place] to release information regarding their care.   Consent: Patient/Guardian gives verbal consent for treatment and assignment of benefits for services provided during this visit. Patient/Guardian expressed understanding and agreed to proceed.    Neysa Hotter, MD 04/28/2023, 10:13 AM

## 2023-04-28 ENCOUNTER — Encounter: Payer: Self-pay | Admitting: Psychiatry

## 2023-04-28 ENCOUNTER — Telehealth (INDEPENDENT_AMBULATORY_CARE_PROVIDER_SITE_OTHER): Payer: 59 | Admitting: Psychiatry

## 2023-04-28 DIAGNOSIS — E611 Iron deficiency: Secondary | ICD-10-CM

## 2023-04-28 DIAGNOSIS — R5383 Other fatigue: Secondary | ICD-10-CM

## 2023-04-28 DIAGNOSIS — F319 Bipolar disorder, unspecified: Secondary | ICD-10-CM

## 2023-04-28 DIAGNOSIS — G4700 Insomnia, unspecified: Secondary | ICD-10-CM | POA: Diagnosis not present

## 2023-04-28 MED ORDER — QUETIAPINE FUMARATE 25 MG PO TABS: 25.0000 mg | ORAL_TABLET | Freq: Every day | ORAL | 1 refills | Status: DC

## 2023-04-28 MED ORDER — LAMOTRIGINE 25 MG PO TABS: 50.0000 mg | ORAL_TABLET | Freq: Every day | ORAL | 1 refills | Status: DC

## 2023-04-28 MED ORDER — VORTIOXETINE HBR 10 MG PO TABS: 10.0000 mg | ORAL_TABLET | Freq: Every day | ORAL | 0 refills | Status: DC

## 2023-04-28 NOTE — Patient Instructions (Signed)
Continue lamotrigine 50 mg daily   Continue quetiapine 175 mg at night   Continue Trintellix 10 mg at night  Next appointment: 10/25 at 10 am

## 2023-05-02 ENCOUNTER — Other Ambulatory Visit: Payer: Self-pay | Admitting: Psychiatry

## 2023-07-15 NOTE — Progress Notes (Signed)
Virtual Visit via Video Note  I connected with Miranda Harris on 07/21/23 at 10:00 AM EDT by a video enabled telemedicine application and verified that I am speaking with the correct person using two identifiers.  Location: Patient: home Provider: office Persons participated in the visit- patient, provider    I discussed the limitations of evaluation and management by telemedicine and the availability of in person appointments. The patient expressed understanding and agreed to proceed.  I discussed the assessment and treatment plan with the patient. The patient was provided an opportunity to ask questions and all were answered. The patient agreed with the plan and demonstrated an understanding of the instructions.   The patient was advised to call back or seek an in-person evaluation if the symptoms worsen or if the condition fails to improve as anticipated.  I provided 25 minutes of non-face-to-face time during this encounter.   Neysa Hotter, MD       [facility] MD/PA/NP OP Progress Note  07/21/2023 10:31 AM Miranda Harris  MRN:  [id number]  Chief Complaint:  Chief Complaint  Patient presents with   Follow-up   HPI:  This is a follow-up appointment for bipolar disorder and insomnia.  She states that she is officially retired.  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  She feels good about this.  She mows lawn.  She always liked gardening, and she likes getting out.  She will go out for a halloween night with her friends.  She thinks she has been adjusting well to living in [place].  She sleeps well.  She is not worried about REM sleep anymore as she has good energy in the morning.  She feels grounded, and realist while she is on the medication.  She denies feeling depressed.  She denies SI.  She denies decreased need for sleep or euphoria.  She feels comfortable to stay on the current medication regimen.    Substance use   Tobacco Alcohol Other substances/  Current   Denies- cannot drink denies  Past   Used to drink a good amount when she was in college Pot, cocaine, occasionally when she was in college, high school  Past Treatment           BP 122/62 Wt  89 kg (196 lb 1.6 oz) 06/12/2023 9:26 AM EDT   Wt Readings from Last 3 Encounters:  07/12/21 192 lb 12.8 oz (87.5 kg)  06/10/21 186 lb 14.4 oz (84.8 kg)  05/28/21 188 lb 14.4 oz (85.7 kg)    Visit Diagnosis:    ICD-10-CM   1. Bipolar 1 disorder (HCC)  F31.9     2. Insomnia, unspecified type  G47.00       Past Psychiatric History: Please see initial evaluation for full details. I have reviewed the history. No updates at this time.     Past Medical History:  Past Medical History:  Diagnosis Date   Bipolar disorder (HCC)    Blood transfusion without reported diagnosis    Chronic kidney disease    Depression    Elevated serum creatinine    related to lithium use   Helicobacter pylori (H. pylori) infection    pt denies   Hypothyroidism    Perirectal fistula    Thyroid disease    Trochanteric bursitis of right hip     Past Surgical History:  Procedure Laterality Date   COLONOSCOPY  01/06/2016  at [facility]   EVALUATION UNDER ANESTHESIA WITH HEMORRHOIDECTOMY N/A 06/28/2017   Procedure: EXAM UNDER ANESTHESIA WITH EXTERNAL HEMORRHOIDECTOMY AND REPAIR OF PERIRECTAL FISTULA;  Surgeon: Karie Soda, MD;  Location: [facility];  Service: General;  Laterality: N/A;   HERNIA REPAIR  8-10 yrs ago   umbilical hernia   INCISION AND DRAINAGE PERIRECTAL ABSCESS N/A 04/18/2017   Procedure: IRRIGATION AND DEBRIDEMENT PERIRECTAL ABSCESS, placement of seton, sigmoidoscopy;  Surgeon: Glenna Fellows, MD;  Location: [facility];  Service: General;  Laterality: N/A;   LAPAROSCOPIC APPENDECTOMY     POLYPECTOMY     VAGINAL HYSTERECTOMY Bilateral    complete   WISDOM TOOTH EXTRACTION      Family Psychiatric History: Please  see initial evaluation for full details. I have reviewed the history. No updates at this time.     Family History:  Family History  Problem Relation Age of Onset   Anxiety disorder Brother    Breast cancer Neg Hx    Colon cancer Neg Hx    Colon polyps Neg Hx    Esophageal cancer Neg Hx    Rectal cancer Neg Hx    Stomach cancer Neg Hx     Social History:  Social History   Socioeconomic History   Marital status: Married    Spouse name: Not on file   Number of children: Not on file   Years of education: Not on file   Highest education level: Not on file  Occupational History   Not on file  Tobacco Use   Smoking status: Former    Current packs/day: 1.00    Average packs/day: 1 pack/day for 30.0 years (30.0 ttl pk-yrs)    Types: Cigarettes   Smokeless tobacco: Never   Tobacco comments:    quit 2008  Vaping Use   Vaping status: Never Used  Substance and Sexual Activity   Alcohol use: Yes    Comment: once a month per pt   Drug use: No   Sexual activity: Yes    Partners: Male    Birth control/protection: Surgical  Other Topics Concern   Not on file  Social History Narrative   Not on file   Social Determinants of Health   Financial Resource Strain: Low Risk  (11/22/2022)   Received from [facility], [facility]   Overall Financial Resource Strain (CARDIA)    Difficulty of Paying Living Expenses: Not hard at all  Food Insecurity: No Food Insecurity (11/22/2022)   [facility], [facility]   Hunger Vital Sign    Worried About Running Out of Food in the Last Year: Never true    Ran Out of Food in the Last Year: Never true  Transportation Needs: No Transportation Needs (11/22/2022)   Received from [facility], [facility]   PRAPARE - Transportation    Lack of Transportation (Medical): No    Lack of Transportation (Non-Medical): No  Physical Activity: Not on file  Stress: Not on file  Social Connections: Unknown (01/27/2022)   Received from  [facility], [facility]   Social Network    Social Network: Not on file    Allergies:  Allergies  Allergen Reactions   Metrogel [Metronidazole] Other (See Comments)    Burn face   Tetracyclines & Related Rash    Metabolic Disorder Labs: Lab Results  Component Value Date   HGBA1C 5.5 09/04/2020   MPG 108 06/10/2015   No results found for: "PROLACTIN" Lab Results  Component Value Date  CHOL 205 (H) 12/25/2020   TRIG 76.0 12/25/2020   HDL 73.30 12/25/2020   CHOLHDL 3 12/25/2020   VLDL 15.2 12/25/2020   LDLCALC 116 (H) 12/25/2020   LDLCALC 122 (H) 09/04/2020   Lab Results  Component Value Date   TSH 0.05 (L) 05/28/2021   TSH 1.32 03/08/2021    Therapeutic Level Labs: Lab Results  Component Value Date   LITHIUM 1.2 11/08/2018   LITHIUM 0.80 06/10/2015   No results found for: "VALPROATE" No results found for: "CBMZ"  Current Medications: Current Outpatient Medications  Medication Sig Dispense Refill   Calcium Carbonate-Vitamin D (CALTRATE 600+D PO) Take 2 tablets by mouth daily.     lamoTRIgine (LAMICTAL) 25 MG tablet Take 2 tablets (50 mg total) by mouth daily. 180 tablet 1   levothyroxine (SYNTHROID) 150 MCG tablet Take 1 tablet (150 mcg total) by mouth daily. 90 tablet 1   levothyroxine (SYNTHROID) 175 MCG tablet TAKE 1 TABLET BY MOUTH EVERY DAY 90 tablet 0   Multiple Vitamin (MULTIVITAMIN WITH MINERALS) TABS tablet Take 1 tablet by mouth daily.     Omega-3 Fatty Acids (FISH OIL) 1000 MG CAPS Take 2,000 mg by mouth daily.     [START ON 09/04/2023] QUEtiapine (SEROQUEL) 100 MG tablet Take 1 tablet (100 mg total) by mouth at bedtime. Total of 175 mg at night. Take along with 50 mg and 25 mg tab 90 tablet 1   QUEtiapine (SEROQUEL) 25 MG tablet Take 1 tablet (25 mg total) by mouth at bedtime. Total of 175 mg at night. Take along with 100 mg, 50 mg tab 90 tablet 1   [START ON 09/04/2023] QUEtiapine (SEROQUEL) 50 MG tablet Take 1 tablet (50 mg total) by mouth at  bedtime. Total of 175 mg at night. Take along with 100 mg and 50 mg tab 90 tablet 1   Vitamin D, Ergocalciferol, (DRISDOL) 1.25 MG (50000 UNIT) CAPS capsule Take 1 capsule (50,000 Units total) by mouth every 7 (seven) days. 12 capsule 0   [START ON 07/27/2023] vortioxetine HBr (TRINTELLIX) 10 MG TABS tablet Take 1 tablet (10 mg total) by mouth daily. 90 tablet 0   No current facility-administered medications for this visit.     Musculoskeletal: Strength & Muscle Tone:  N/A Gait & Station:  N/A Patient leans: N/A  Psychiatric Specialty Exam: Review of Systems  Psychiatric/Behavioral: Negative.    All other systems reviewed and are negative.   There were no vitals taken for this visit.There is no height or weight on file to calculate BMI.  General Appearance: Well Groomed  Eye Contact:  Good  Speech:  Clear and Coherent  Volume:  Normal  Mood:   good  Affect:  Appropriate, Congruent, and Full Range  Thought Process:  Coherent  Orientation:  Full (Time, Place, and Person)  Thought Content: Logical   Suicidal Thoughts:  No  Homicidal Thoughts:  No  Memory:  Immediate;   Good  Judgement:  Good  Insight:  Good  Psychomotor Activity:  Normal  Concentration:  Concentration: Good and Attention Span: Good  Recall:  Good  Fund of Knowledge: Good  Language: Good  Akathisia:  No  Handed:  Right  AIMS (if indicated): not done  Assets:  Communication Skills Desire for Improvement  ADL's:  Intact  Cognition: WNL  Sleep:  Good   Screenings: PHQ2-9    Flowsheet Row Video Visit from 10/12/2021 in [facility] Office Visit from 07/12/2021 in [facility] Video Visit from 02/11/2021 in [facility] Video Visit from 11/12/2020 in [facility] Office Visit from 12/13/2019 in [facility] Total  Score 0 4 0 0 0  PHQ-9 Total Score -- 11 -- -- 6      Flowsheet Row Video Visit from 10/12/2021 in [facility] Video Visit from 07/21/2021 in [facility] Office Visit from 07/12/2021 in [facility]  C-SSRS RISK CATEGORY No Risk Error: Q3, 4, or 5 should not be populated when Q2 is No High Risk        Assessment and Plan:  Miranda Harris is a 62 y.o. 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    1. Bipolar 1 disorder (HCC) 2. Insomnia, unspecified type Acute stressors include: declining in the condition of her dog, work related stress  Other stressors include:    History: admitted at age 74 due to mania, psychosis, depression, another episode at age 71 due to non adherence. Originally on lithium 300 mg QID, trifluoperazine 2 mg qhs, lexapro 20 mg daily, xanax 0.5 mg qhsprn     There has been steady improvement in depressive symptoms and insomnia since uptitration of quetiapine.  Will continue current dose of quetiapine to target bipolar disorder and insomnia.  Will continue lamotrigine for bipolar disorder.  Will continue Trintellix for bipolar depression.    3. Fatigue, unspecified type 4. Iron deficiency She has been rechecked ferritin level, which was higher than normal range.  She also has low TSH.  She was advised to follow up with her primary care provider for further guidance.    Plan:   Continue lamotrigine 50 mg daily  (dizziness from 100 mg ) - she declined a refill Continue quetiapine 175 mg at night  EKG. QTc 389 msec, HR 64 10/2022  Continue Trintellix 10 mg at night  Next appointment: 1/3  at 10 am for 30 mins, video   - thyroid is within wnl per patient 10/2022 - [facility] - vitamin D was within limits 2023    Past trials of medication: Sertraline, fluoxetine (drowsiness), Lexapro,  Duloxetine, venlafaxine (initially worked well, stopped working, hair loss), bupropion (insomnia),  Paxil (urinary retention), Latuda, Abilify (flu like symptoms), Haldol (EPS), Thorazine (EPS), Stelazine,  Lithium, Lunesta (sedation), Ambien (sleepwalking, driving, eating), Trazodone (excessive sedation), Xanax    The patient demonstrates the following  risk factors for suicide: Chronic risk factors for suicide include psychiatric disorder /bipolar disorder, history of substance use disorder, family history of suicide attempt. Acute risk factors for suicide include none. Protective factors for this patient include positive social support, responsibility to others (family), coping skills, hope for the future. The overall suicide risk at this point appears to be low, and she is appropriate for outpatient follow up.     Collaboration of Care: Collaboration of Care: Other reviewed notes in Epic  Patient/Guardian was advised Release of Information must be obtained prior to any record release in order to collaborate their care with an outside provider. Patient/Guardian was advised if they have not already done so to contact the registration department to sign all necessary forms in order for [place] to release information regarding their care.   Consent: Patient/Guardian gives verbal  consent for treatment and assignment of benefits for services provided during this visit. Patient/Guardian expressed understanding and agreed to proceed.    Neysa Hotter, MD 07/21/2023, 10:31 AM

## 2023-07-21 ENCOUNTER — Telehealth (INDEPENDENT_AMBULATORY_CARE_PROVIDER_SITE_OTHER): Payer: 59 | Admitting: Psychiatry

## 2023-07-21 ENCOUNTER — Encounter: Payer: Self-pay | Admitting: Psychiatry

## 2023-07-21 DIAGNOSIS — F319 Bipolar disorder, unspecified: Secondary | ICD-10-CM | POA: Diagnosis not present

## 2023-07-21 DIAGNOSIS — G4700 Insomnia, unspecified: Secondary | ICD-10-CM

## 2023-07-21 MED ORDER — QUETIAPINE FUMARATE 100 MG PO TABS: 100.0000 mg | ORAL_TABLET | Freq: Every day | ORAL | 1 refills | Status: DC

## 2023-07-21 MED ORDER — QUETIAPINE FUMARATE 50 MG PO TABS: 50.0000 mg | ORAL_TABLET | Freq: Every day | ORAL | 1 refills | Status: DC

## 2023-07-21 MED ORDER — VORTIOXETINE HBR 10 MG PO TABS: 10.0000 mg | ORAL_TABLET | Freq: Every day | ORAL | 0 refills | Status: DC

## 2023-07-21 NOTE — Patient Instructions (Signed)
Continue lamotrigine 50 mg daily   Continue quetiapine 175 mg at night  Continue Trintellix 10 mg at night  Next appointment: 1/3  at 10 am

## 2023-07-27 ENCOUNTER — Other Ambulatory Visit: Payer: Self-pay | Admitting: Psychiatry

## 2023-09-18 NOTE — Progress Notes (Deleted)
BH MD/PA/NP OP Progress Note  09/18/2023 4:44 PM Miranda Harris  MRN:  [id number]  Chief Complaint: No chief complaint on file.  HPI: ***   Substance use   Tobacco Alcohol Other substances/  Current   Denies- cannot drink denies  Past   Used to drink a good amount when she was in college Pot, cocaine, occasionally when she was in college, high school  Past Treatment           Visit Diagnosis: No diagnosis found.  Past Psychiatric History: Please see initial evaluation for full details. I have reviewed the history. No updates at this time.     Past Medical History:  Past Medical History:  Diagnosis Date   Bipolar disorder (HCC)    Blood transfusion without reported diagnosis    Chronic kidney disease    Depression    Elevated serum creatinine    related to lithium use   Helicobacter pylori (H. pylori) infection    pt denies   Hypothyroidism    Perirectal fistula    Thyroid disease    Trochanteric bursitis of right hip     Past Surgical History:  Procedure Laterality Date   COLONOSCOPY  01/06/2016   at [facility]   EVALUATION UNDER ANESTHESIA WITH HEMORRHOIDECTOMY N/A 06/28/2017   Procedure: EXAM UNDER ANESTHESIA WITH EXTERNAL HEMORRHOIDECTOMY AND REPAIR OF PERIRECTAL FISTULA;  Surgeon: Karie Soda, MD;  Location: [facility];  Service: General;  Laterality: N/A;   HERNIA REPAIR  8-10 yrs ago   umbilical hernia   INCISION AND DRAINAGE PERIRECTAL ABSCESS N/A 04/18/2017   Procedure: IRRIGATION AND DEBRIDEMENT PERIRECTAL ABSCESS, placement of seton, sigmoidoscopy;  Surgeon: Glenna Fellows, MD;  Location: [facility];  Service: General;  Laterality: N/A;   LAPAROSCOPIC APPENDECTOMY     POLYPECTOMY     VAGINAL HYSTERECTOMY Bilateral    complete   WISDOM TOOTH EXTRACTION      Family Psychiatric History: Please see initial evaluation for full details. I have reviewed the history. No updates at this time.     Family History:  Family History  Problem  Relation Age of Onset   Anxiety disorder Brother    Breast cancer Neg Hx    Colon cancer Neg Hx    Colon polyps Neg Hx    Esophageal cancer Neg Hx    Rectal cancer Neg Hx    Stomach cancer Neg Hx     Social History:  Social History   Socioeconomic History   Marital status: Married    Spouse name: Not on file   Number of children: Not on file   Years of education: Not on file   Highest education level: Not on file  Occupational History   Not on file  Tobacco Use   Smoking status: Former    Current packs/day: 1.00    Average packs/day: 1 pack/day for 30.0 years (30.0 ttl pk-yrs)    Types: Cigarettes   Smokeless tobacco: Never   Tobacco comments:    quit 2008  Vaping Use   Vaping status: Never Used  Substance and Sexual Activity   Alcohol use: Yes    Comment: once a month per pt   Drug use: No   Sexual activity: Yes    Partners: Male    Birth control/protection: Surgical  Other Topics Concern   Not on file  Social History Narrative   Not on file   Social Drivers of Health   Financial Resource Strain: Low Risk  (11/22/2022)  Received from [facility], [facility]   Overall Financial Resource Strain (CARDIA)    Difficulty of Paying Living Expenses: Not hard at all  Food Insecurity: No Food Insecurity (11/22/2022)   Received from [facility], [facility]   Hunger Vital Sign    Worried About Running Out of Food in the Last Year: Never true    Ran Out of Food in the Last Year: Never true  Transportation Needs: No Transportation Needs (11/22/2022)   Received from [facility], [facility]   [facility] - Transportation    Lack of Transportation (Medical): No    Lack of Transportation (Non-Medical): No  Physical Activity: Not on file  Stress: Not on file  Social Connections: Unknown (01/27/2022)   Received from [facility], [facility]   Social Network    Social Network: Not on file    Allergies:  Allergies  Allergen Reactions   Metrogel  [Metronidazole] Other (See Comments)    Burn face   Tetracyclines & Related Rash    Metabolic Disorder Labs: Lab Results  Component Value Date   HGBA1C 5.5 09/04/2020   MPG 108 06/10/2015   No results found for: "PROLACTIN" Lab Results  Component Value Date   CHOL 205 (H) 12/25/2020   TRIG 76.0 12/25/2020   HDL 73.30 12/25/2020   CHOLHDL 3 12/25/2020   VLDL 15.2 12/25/2020   LDLCALC 116 (H) 12/25/2020   LDLCALC 122 (H) 09/04/2020   Lab Results  Component Value Date   TSH 0.05 (L) 05/28/2021   TSH 1.32 03/08/2021    Therapeutic Level Labs: Lab Results  Component Value Date   LITHIUM 1.2 11/08/2018   LITHIUM 0.80 06/10/2015   No results found for: "VALPROATE" No results found for: "CBMZ"  Current Medications: Current Outpatient Medications  Medication Sig Dispense Refill   Calcium Carbonate-Vitamin D (CALTRATE 600+D PO) Take 2 tablets by mouth daily.     lamoTRIgine (LAMICTAL) 25 MG tablet Take 2 tablets (50 mg total) by mouth daily. 180 tablet 1   levothyroxine (SYNTHROID) 150 MCG tablet Take 1 tablet (150 mcg total) by mouth daily. 90 tablet 1   levothyroxine (SYNTHROID) 175 MCG tablet TAKE 1 TABLET BY MOUTH EVERY DAY 90 tablet 0   Multiple Vitamin (MULTIVITAMIN WITH MINERALS) TABS tablet Take 1 tablet by mouth daily.     Omega-3 Fatty Acids (FISH OIL) 1000 MG CAPS Take 2,000 mg by mouth daily.     QUEtiapine (SEROQUEL) 100 MG tablet Take 1 tablet (100 mg total) by mouth at bedtime. Total of 175 mg at night. Take along with 50 mg and 25 mg tab 90 tablet 1   QUEtiapine (SEROQUEL) 25 MG tablet Take 1 tablet (25 mg total) by mouth at bedtime. Total of 175 mg at night. Take along with 100 mg, 50 mg tab 90 tablet 1   QUEtiapine (SEROQUEL) 50 MG tablet Take 1 tablet (50 mg total) by mouth at bedtime. Total of 175 mg at night. Take along with 100 mg and 50 mg tab 90 tablet 1   Vitamin D, Ergocalciferol, (DRISDOL) 1.25 MG (50000 UNIT) CAPS capsule Take 1 capsule (50,000 Units  total) by mouth every 7 (seven) days. 12 capsule 0   vortioxetine HBr (TRINTELLIX) 10 MG TABS tablet Take 1 tablet (10 mg total) by mouth daily. 90 tablet 0   No current facility-administered medications for this visit.     Musculoskeletal: Strength & Muscle Tone:  N/A Gait & Station:  N/A Patient leans: N/A  Psychiatric Specialty  Exam: Review of Systems  There were no vitals taken for this visit.There is no height or weight on file to calculate BMI.  General Appearance: {Appearance:22683}  Eye Contact:  {BHH EYE CONTACT:22684}  Speech:  Clear and Coherent  Volume:  Normal  Mood:  {BHH MOOD:22306}  Affect:  {Affect (PAA):22687}  Thought Process:  Coherent  Orientation:  Full (Time, Place, and Person)  Thought Content: Logical   Suicidal Thoughts:  {ST/HT (PAA):22692}  Homicidal Thoughts:  {ST/HT (PAA):22692}  Memory:  Immediate;   Good  Judgement:  {Judgement (PAA):22694}  Insight:  {Insight (PAA):22695}  Psychomotor Activity:  Normal  Concentration:  Concentration: Good and Attention Span: Good  Recall:  Good  Fund of Knowledge: Good  Language: Good  Akathisia:  No  Handed:  Right  AIMS (if indicated): not done  Assets:  Communication Skills Desire for Improvement  ADL's:  Intact  Cognition: WNL  Sleep:  {BHH GOOD/FAIR/POOR:22877}   Screenings: PHQ2-9    Flowsheet Row Video Visit from 10/12/2021 in [facility] Office Visit from 07/12/2021 in [facility] Video Visit from 02/11/2021 in [facility] Video Visit from 11/12/2020 in [facility] Office Visit from 12/13/2019 in [facility] [place]  PHQ-2 Total Score 0 4 0 0 0  PHQ-9 Total Score -- 11 -- -- 6      Flowsheet Row Video Visit from 10/12/2021 in [facility] Video Visit from 07/21/2021 in  [facility] Office Visit from 07/12/2021 in [facility]  C-SSRS RISK CATEGORY No Risk Error: Q3, 4, or 5 should not be populated when Q2 is No High Risk        Assessment and Plan:  Miranda Harris is a 62 y.o. 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    1. Bipolar 1 disorder (HCC) 2. Insomnia, unspecified type Acute stressors include: declining in the condition of her dog, work related stress  Other stressors include:    History: admitted at age 53 due to mania, psychosis, depression, another episode at age 82 due to non adherence. Originally on lithium 300 mg QID, trifluoperazine 2 mg qhs, lexapro 20 mg daily, xanax 0.5 mg qhsprn     There has been steady improvement in depressive symptoms and insomnia since uptitration of quetiapine.  Will continue current dose of quetiapine to target bipolar disorder and insomnia.  Will continue lamotrigine for bipolar disorder.  Will continue Trintellix for bipolar depression.      3. Fatigue, unspecified type 4. Iron deficiency She has been rechecked ferritin level, which was higher than normal range.  She also has low TSH.  She was advised to follow up with her primary care provider for further guidance.    Plan:   Continue lamotrigine 50 mg daily  (dizziness from 100 mg ) - she declined a refill Continue quetiapine 175 mg at night  EKG. QTc 389 msec, HR 64 10/2022  Continue Trintellix 10 mg at night  Next appointment: 1/3  at 10 am for 30 mins, video   - thyroid is within wnl per patient 10/2022 - [facility] - vitamin D was within limits 2023    Past trials of medication: Sertraline, fluoxetine (drowsiness), Lexapro,  Duloxetine, venlafaxine (initially worked well, stopped working, hair  loss), bupropion (insomnia),  Paxil (urinary retention), Latuda, Abilify (flu like symptoms), Haldol (EPS), Thorazine (EPS), Stelazine,  Lithium, Lunesta (sedation), Ambien (sleepwalking, driving, eating), Trazodone (excessive sedation), Xanax    The patient demonstrates the following  risk factors for suicide: Chronic risk factors for suicide include psychiatric disorder /bipolar disorder, history of substance use disorder, family history of suicide attempt. Acute risk factors for suicide include none. Protective factors for this patient include positive social support, responsibility to others (family), coping skills, hope for the future. The overall suicide risk at this point appears to be low, and she is appropriate for outpatient follow up.     Collaboration of Care: Collaboration of Care: {BH OP Collaboration of Care:21014065}  Patient/Guardian was advised Release of Information must be obtained prior to any record release in order to collaborate their care with an outside provider. Patient/Guardian was advised if they have not already done so to contact the registration department to sign all necessary forms in order for [place] to release information regarding their care.   Consent: Patient/Guardian gives verbal consent for treatment and assignment of benefits for services provided during this visit. Patient/Guardian expressed understanding and agreed to proceed.    Neysa Hotter, MD 09/18/2023, 4:44 PM

## 2023-09-29 ENCOUNTER — Telehealth: Payer: Medicare Other | Admitting: Psychiatry

## 2023-10-04 LAB — LAB REPORT - SCANNED
EGFR: 57
Free T4: 1.77
TSH: 0.15 — AB (ref 0.41–5.90)

## 2023-10-10 ENCOUNTER — Encounter: Payer: Self-pay | Admitting: Psychiatry

## 2023-10-10 ENCOUNTER — Telehealth: Payer: Self-pay | Admitting: Psychiatry

## 2023-10-10 ENCOUNTER — Telehealth (INDEPENDENT_AMBULATORY_CARE_PROVIDER_SITE_OTHER): Payer: Medicare Other | Admitting: Psychiatry

## 2023-10-10 DIAGNOSIS — F319 Bipolar disorder, unspecified: Secondary | ICD-10-CM

## 2023-10-10 DIAGNOSIS — G4700 Insomnia, unspecified: Secondary | ICD-10-CM

## 2023-10-10 MED ORDER — QUETIAPINE FUMARATE 25 MG PO TABS: 25.0000 mg | ORAL_TABLET | Freq: Every day | ORAL | 0 refills | Status: DC

## 2023-10-10 MED ORDER — ESCITALOPRAM OXALATE 10 MG PO TABS: ORAL_TABLET | ORAL | 1 refills | Status: DC

## 2023-10-10 NOTE — Patient Instructions (Signed)
 Continue lamotrigine 50 mg daily   Continue quetiapine 175 mg at night  Decrease Trintellix 5 mg at night for one week, then discontinue Start lexparo 5 mg daily for one week, then 10 mg daily  Next appointment: 2/21 at 11 AM

## 2023-10-10 NOTE — Progress Notes (Signed)
 Virtual Visit via Video Note  I connected with Laraine Samet Rabon on 10/10/23 at  1:00 PM EST by a video enabled telemedicine application and verified that I am speaking with the correct person using two identifiers.  Location: Patient: home Provider: office Persons participated in the visit- patient, provider    I discussed the limitations of evaluation and management by telemedicine and the availability of in person appointments. The patient expressed understanding and agreed to proceed.    I discussed the assessment and treatment plan with the patient. The patient was provided an opportunity to ask questions and all were answered. The patient agreed with the plan and demonstrated an understanding of the instructions.   The patient was advised to call back or seek an in-person evaluation if the symptoms worsen or if the condition fails to improve as anticipated.  I provided 30 minutes of non-face-to-face time during this encounter.   [name], MD    [facility] MD/PA/NP OP Progress Note  10/10/2023 1:40 PM Cassy Sprowl  MRN:  [id number]  Chief Complaint:  Chief Complaint  Patient presents with   Follow-up   HPI:  This is a follow-up appointment for bipolar 1 disorder and insomnia.  She states that her husband has been fired from work after working over 10 years.  He accidentally broke an equipment.  He has found a new job, and there is 30% cut in the income.  She is also concerned that insurance coverage might not be good.  Trintellix /brand will not be covered.  She does not think higher dose of lamotrigine  will be helpful, and she is very concerned if she were to be off antidepressant.  She agrees to try Lexapro  at this time, which was the medication she is to be taken with ltihium, stelazine  when she was under the care by Dr. Curry. She has applied for job to supplement income.   She has been able to maintain daily routine, including household chores, working out in the  morning.  She denies insomnia.  Although she feels down due to this, she notes that the situation will be getting better. She thinks she would feel better when she is around with other. She denies SI.    Substance use   Tobacco Alcohol Other substances/  Current   Denies- cannot drink denies  Past   Used to drink a good amount when she was in college Pot, cocaine, occasionally when she was in college, high school  Past Treatment           Visit Diagnosis:    ICD-10-CM   1. Bipolar 1 disorder (HCC)  F31.9     2. Insomnia, unspecified type  G47.00       Past Psychiatric History: Please see initial evaluation for full details. I have reviewed the history. No updates at this time.     Past Medical History:  Past Medical History:  Diagnosis Date   Bipolar disorder (HCC)    Blood transfusion without reported diagnosis    Chronic kidney disease    Depression    Elevated serum creatinine    related to lithium  use   Helicobacter pylori (H. pylori) infection    pt denies   Hypothyroidism    Perirectal fistula    Thyroid  disease    Trochanteric bursitis of right hip     Past Surgical History:  Procedure Laterality Date   COLONOSCOPY  01/06/2016   at [facility]   EVALUATION UNDER ANESTHESIA WITH HEMORRHOIDECTOMY N/A 06/28/2017  Procedure: EXAM UNDER ANESTHESIA WITH EXTERNAL HEMORRHOIDECTOMY AND REPAIR OF PERIRECTAL FISTULA;  Surgeon: Sheldon Standing, MD;  Location: [facility];  Service: General;  Laterality: N/A;   HERNIA REPAIR  8-10 yrs ago   umbilical hernia   INCISION AND DRAINAGE PERIRECTAL ABSCESS N/A 04/18/2017   Procedure: IRRIGATION AND DEBRIDEMENT PERIRECTAL ABSCESS, placement of seton, sigmoidoscopy;  Surgeon: Mikell Katz, MD;  Location: [facility];  Service: General;  Laterality: N/A;   LAPAROSCOPIC APPENDECTOMY     POLYPECTOMY     VAGINAL HYSTERECTOMY Bilateral    complete   WISDOM TOOTH EXTRACTION      Family Psychiatric History: Please see initial  evaluation for full details. I have reviewed the history. No updates at this time.     Family History:  Family History  Problem Relation Age of Onset   Anxiety disorder Brother    Breast cancer Neg Hx    Colon cancer Neg Hx    Colon polyps Neg Hx    Esophageal cancer Neg Hx    Rectal cancer Neg Hx    Stomach cancer Neg Hx     Social History:  Social History   Socioeconomic History   Marital status: Married    Spouse name: Not on file   Number of children: Not on file   Years of education: Not on file   Highest education level: Not on file  Occupational History   Not on file  Tobacco Use   Smoking status: Former    Current packs/day: 1.00    Average packs/day: 1 pack/day for 30.0 years (30.0 ttl pk-yrs)    Types: Cigarettes   Smokeless tobacco: Never   Tobacco comments:    quit 2008  Vaping Use   Vaping status: Never Used  Substance and Sexual Activity   Alcohol use: Yes    Comment: once a month per pt   Drug use: No   Sexual activity: Yes    Partners: Male    Birth control/protection: Surgical  Other Topics Concern   Not on file  Social History Narrative   Not on file   Social Drivers of Health   Financial Resource Strain: Low Risk  (10/04/2023)   Received from [facility]   Overall Financial Resource Strain (CARDIA)    Difficulty of Paying Living Expenses: Not hard at all  Food Insecurity: No Food Insecurity (10/04/2023)   [facility] from [facility]   Hunger Vital Sign    Worried About Running Out of Food in the Last Year: Never true    Ran Out of Food in the Last Year: Never true  Transportation Needs: No Transportation Needs (10/04/2023)   Received from [facility] - Transportation    Lack of Transportation (Medical): No    Lack of Transportation (Non-Medical): No  Physical Activity: Not on file  Stress: Not on file  Social Connections: Unknown (01/27/2022)   Received from [facility]   Social Network    Social Network:  Not on file    Allergies:  Allergies  Allergen Reactions   Metrogel  [Metronidazole ] Other (See Comments)    Burn face   Tetracyclines & Related Rash    Metabolic Disorder Labs: Lab Results  Component Value Date   HGBA1C 5.5 09/04/2020   MPG 108 06/10/2015   No results found for: PROLACTIN Lab Results  Component Value Date   CHOL 205 (H) 12/25/2020   TRIG 76.0 12/25/2020   HDL 73.30 12/25/2020   CHOLHDL 3  12/25/2020   VLDL 15.2 12/25/2020   LDLCALC 116 (H) 12/25/2020   LDLCALC 122 (H) 09/04/2020   Lab Results  Component Value Date   TSH 0.05 (L) 05/28/2021   TSH 1.32 03/08/2021    Therapeutic Level Labs: Lab Results  Component Value Date   LITHIUM  1.2 11/08/2018   LITHIUM  0.80 06/10/2015   No results found for: VALPROATE No results found for: CBMZ  Current Medications: Current Outpatient Medications  Medication Sig Dispense Refill   escitalopram  (LEXAPRO ) 10 MG tablet 5 mg daily for one week, then 10 mg daily 30 tablet 1   Calcium Carbonate-Vitamin D  (CALTRATE 600+D PO) Take 2 tablets by mouth daily.     lamoTRIgine  (LAMICTAL ) 25 MG tablet Take 2 tablets (50 mg total) by mouth daily. 180 tablet 1   levothyroxine  (SYNTHROID ) 150 MCG tablet Take 1 tablet (150 mcg total) by mouth daily. 90 tablet 1   levothyroxine  (SYNTHROID ) 175 MCG tablet TAKE 1 TABLET BY MOUTH EVERY DAY (Patient not taking: Reported on 10/10/2023) 90 tablet 0   Multiple Vitamin (MULTIVITAMIN WITH MINERALS) TABS tablet Take 1 tablet by mouth daily.     Omega-3 Fatty Acids (FISH OIL) 1000 MG CAPS Take 2,000 mg by mouth daily.     QUEtiapine  (SEROQUEL ) 100 MG tablet Take 1 tablet (100 mg total) by mouth at bedtime. Total of 175 mg at night. Take along with 50 mg and 25 mg tab 90 tablet 1   [START ON 10/25/2023] QUEtiapine  (SEROQUEL ) 25 MG tablet Take 1 tablet (25 mg total) by mouth at bedtime. Total of 175 mg at night. Take along with 100 mg, 50 mg tab 90 tablet 0   QUEtiapine  (SEROQUEL ) 50 MG  tablet Take 1 tablet (50 mg total) by mouth at bedtime. Total of 175 mg at night. Take along with 100 mg and 50 mg tab 90 tablet 1   Vitamin D , Ergocalciferol , (DRISDOL ) 1.25 MG (50000 UNIT) CAPS capsule Take 1 capsule (50,000 Units total) by mouth every 7 (seven) days. 12 capsule 0   No current facility-administered medications for this visit.     Musculoskeletal: Strength & Muscle Tone:  N/A Gait & Station:  N/A Patient leans: N/A  Psychiatric Specialty Exam: Review of Systems  Psychiatric/Behavioral:  Positive for dysphoric mood. Negative for agitation, behavioral problems, confusion, decreased concentration, hallucinations, self-injury, sleep disturbance and suicidal ideas. The patient is not nervous/anxious and is not hyperactive.   All other systems reviewed and are negative.   There were no vitals taken for this visit.There is no height or weight on file to calculate BMI.  General Appearance: Well Groomed  Eye Contact:  Good  Speech:  Clear and Coherent  Volume:  Normal  Mood:   down  Affect:  Appropriate, Congruent, and slightly down  Thought Process:  Coherent  Orientation:  Full (Time, Place, and Person)  Thought Content: Logical   Suicidal Thoughts:  No  Homicidal Thoughts:  No  Memory:  Immediate;   Good  Judgement:  Good  Insight:  Good  Psychomotor Activity:  Normal  Concentration:  Concentration: Good and Attention Span: Good  Recall:  Good  Fund of Knowledge: Good  Language: Good  Akathisia:  No  Handed:  Right  AIMS (if indicated): not done  Assets:  Communication Skills Desire for Improvement  ADL's:  Intact  Cognition: WNL  Sleep:  Good   Screenings: PHQ2-9    Flowsheet Row Video Visit from 10/12/2021 in [facility]  Visit from 07/12/2021 in [facility] Video Visit from 02/11/2021 in [facility] Video Visit from 11/12/2020 in  [facility] Office Visit from 12/13/2019 in [facility] Total Score 0 4 0 0 0  PHQ-9 Total Score -- 11 -- -- 6      Flowsheet Row Video Visit from 10/12/2021 in [facility] Video Visit from 07/21/2021 in [facility] Office Visit from 07/12/2021 in [facility]  C-SSRS RISK CATEGORY No Risk Error: Q3, 4, or 5 should not be populated when Q2 is No High Risk        Assessment and Plan:  Sieara Bremer is a 63 y.o. 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    1. Bipolar 1 disorder (HCC) 2. Insomnia, unspecified type Acute stressors include: declining in the condition of her dog,  Other stressors include:    History: admitted at age 4 due to mania, psychosis, depression, another episode at age 48 due to non adherence. Originally on lithium  300 mg QID, trifluoperazine  2 mg qhs, lexapro  20 mg daily, xanax  0.5 mg qhsprn      She reports slight worsening down mood,  attributed to her husband being laid off from work and her need to resume job searching to supplement their income.  There is a concern of insurance coverage over Trintellix .  She was struggling with depressive symptoms prior to starting Trintellix ; will cross taper to Lexapro  to see if it is effective for her mood.  Will continue lamotrigine  for bipolar disorder, and quetiapine  for bipolar disorder and insomnia.   # hypothyroidism She was seen by her PCP, and levothyroxine  level was tapered down.    Plan:   Continue lamotrigine  50 mg daily  (dizziness from 100 mg ) - she declined a refill Continue quetiapine  175 mg at night  EKG. QTc 389 msec, HR 64 10/2022   Decrease Trintellix  5 mg at night for one week, then discontinue Start lexparo 5 mg daily for one week, then 10 mg daily  Next appointment: 2/21 at 11 AM, video - vitamin D  was within limits 2023    Past trials of medication: Sertraline, fluoxetine  (drowsiness), Lexapro ,  Duloxetine, venlafaxine  (initially worked well, stopped working, hair loss), bupropion (insomnia),  Paxil  (urinary retention), Latuda , Abilify (flu like symptoms), Haldol (EPS), Thorazine (EPS), Stelazine ,  Lithium , Lunesta (sedation), Ambien (sleepwalking, driving, eating), Trazodone  (excessive sedation), Xanax     The patient demonstrates the following  risk factors for suicide: Chronic risk factors for suicide include psychiatric disorder /bipolar disorder, history of substance use disorder, family history of suicide attempt. Acute risk factors for suicide include none. Protective factors for this patient include positive social support, responsibility to others (family), coping skills, hope for the future. The overall suicide risk at this point appears to be low, and she is appropriate for outpatient follow up.     Collaboration of Care: Collaboration of Care: Other reviewed notes in Epic  Patient/Guardian was advised Release of Information must be obtained prior to any record release in order to collaborate their care with an outside provider. Patient/Guardian was advised if they have not already done so to contact the registration department to sign all necessary forms in order  for us  to release information regarding their care.   Consent: Patient/Guardian gives verbal consent for treatment and assignment of benefits for services provided during this visit. Patient/Guardian expressed understanding and agreed to proceed.    [name], MD 10/10/2023, 1:40 PM

## 2023-10-10 NOTE — Telephone Encounter (Signed)
 Reviewed blood test done on 10/04/2023.  CMP- BUN 29, Cre 1.09, GFR 57, otherwise wnl THS 0.149, free T4 1.77 [place] 121

## 2023-10-20 ENCOUNTER — Telehealth: Payer: Medicare Other | Admitting: Psychiatry

## 2023-10-26 ENCOUNTER — Telehealth: Payer: Self-pay | Admitting: Psychiatry

## 2023-10-26 NOTE — Telephone Encounter (Signed)
Please advise her to discontinue Lexapro. If she still has Trintellix 10 mg, recommend that she restart that medication for now until the next visit.

## 2023-10-27 ENCOUNTER — Other Ambulatory Visit: Payer: Self-pay | Admitting: Psychiatry

## 2023-10-27 MED ORDER — VORTIOXETINE HBR 10 MG PO TABS: 10.0000 mg | ORAL_TABLET | Freq: Every day | ORAL | 0 refills | Status: DC

## 2023-10-27 NOTE — Telephone Encounter (Signed)
 Pt.notified

## 2023-10-27 NOTE — Telephone Encounter (Signed)
Called patient advise her to discontinue the Lexapro she voiced understanding and she stated that she only has 10 tablets of the Trintellix 10 mg left and would like for you to send a refill to the following pharmacy please advise  [id number] Lorenza Evangelist, [address] - [address] AT [facility] [address]  [address] [place] [address]  Phone: [phone] Fax: [phone]

## 2023-10-27 NOTE — Telephone Encounter (Signed)
Ordered. Please contact [facility], and cancel lexapro order.

## 2023-10-27 NOTE — Telephone Encounter (Signed)
Patient called back to mention that the Trintellix only needs to be a 30 day supply due to insurance it will not pay for a 90 day supply

## 2023-10-27 NOTE — Telephone Encounter (Signed)
 Pharmacy notified.

## 2023-11-01 ENCOUNTER — Telehealth: Payer: Medicare Other | Admitting: Psychiatry

## 2023-11-08 NOTE — Progress Notes (Signed)
Virtual Visit via Video Note  I connected with Miranda Harris on 11/14/23 at  3:30 PM EST by a video enabled telemedicine application and verified that I am speaking with the correct person using two identifiers.  Location: Patient: home Provider: office Persons participated in the visit- patient, provider    I discussed the limitations of evaluation and management by telemedicine and the availability of in person appointments. The patient expressed understanding and agreed to proceed.    I discussed the assessment and treatment plan with the patient. The patient was provided an opportunity to ask questions and all were answered. The patient agreed with the plan and demonstrated an understanding of the instructions.   The patient was advised to call back or seek an in-person evaluation if the symptoms worsen or if the condition fails to improve as anticipated.   Neysa Hotter, MD    [facility] MD/PA/NP OP Progress Note  11/14/2023 4:10 PM Miranda Harris  MRN:  [id number]  Chief Complaint:  Chief Complaint  Patient presents with   Follow-up   HPI:  This is a follow-up appointment for bipolar disorder and insomnia.  She states that she is not doing well.  She had brain fog, and nausea from Lexapro.  She has been taking Trintellix every other day since then, and she ran out.  She is not motivated to do things.  There were some days he was heard to get out of the bed.  She did wake up feeling SI, although she adamantly denies any plan or intent due to its impact on her family.  She agrees to come reported that any worsening.  She sleeps 8 hours, and reports good benefit from quetiapine.  She denies change in appetite.  She denies decreased need for sleep or euphoria.  She agrees with the plans as outlined below.   Substance use   Tobacco Alcohol Other substances/  Current   Denies- cannot drink denies  Past   Used to drink a good amount when she was in college Pot, cocaine,  occasionally when she was in college, high school  Past Treatment            Visit Diagnosis:    ICD-10-CM   1. Bipolar 1 disorder (HCC)  F31.9     2. Insomnia, unspecified type  G47.00       Past Psychiatric History: Please see initial evaluation for full details. I have reviewed the history. No updates at this time.     Past Medical History:  Past Medical History:  Diagnosis Date   Bipolar disorder (HCC)    Blood transfusion without reported diagnosis    Chronic kidney disease    Depression    Elevated serum creatinine    related to lithium use   Helicobacter pylori (H. pylori) infection    pt denies   Hypothyroidism    Perirectal fistula    Thyroid disease    Trochanteric bursitis of right hip     Past Surgical History:  Procedure Laterality Date   [facility]   EVALUATION UNDER ANESTHESIA WITH HEMORRHOIDECTOMY N/A 06/28/2017   Procedure: EXAM UNDER ANESTHESIA WITH EXTERNAL HEMORRHOIDECTOMY AND REPAIR OF PERIRECTAL FISTULA;  Surgeon: Karie Soda, MD;  Location: [facility];  Service: General;  Laterality: N/A;   HERNIA REPAIR  8-10 yrs ago   umbilical hernia   INCISION AND DRAINAGE PERIRECTAL ABSCESS N/A 04/18/2017   Procedure: IRRIGATION AND DEBRIDEMENT PERIRECTAL ABSCESS, placement of  seton, sigmoidoscopy;  Surgeon: Glenna Fellows, MD;  Location: [facility];  Service: General;  Laterality: N/A;   LAPAROSCOPIC APPENDECTOMY     POLYPECTOMY     VAGINAL HYSTERECTOMY Bilateral    complete   WISDOM TOOTH EXTRACTION      Family Psychiatric History: Please see initial evaluation for full details. I have reviewed the history. No updates at this time.     Family History:  Family History  Problem Relation Age of Onset   Anxiety disorder Brother    Breast cancer Neg Hx    Colon cancer Neg Hx    Colon polyps Neg Hx    Esophageal cancer Neg Hx    Rectal cancer Neg Hx    Stomach cancer Neg Hx     Social History:  Social  History   Socioeconomic History   Marital status: Married    Spouse name: Not on file   Number of children: Not on file   Years of education: Not on file   Highest education level: Not on file  Occupational History   Not on file  Tobacco Use   Smoking status: Former    Current packs/day: 1.00    Average packs/day: 1 pack/day for 30.0 years (30.0 ttl pk-yrs)    Types: Cigarettes   Smokeless tobacco: Never   Tobacco comments:    quit 2008  Vaping Use   Vaping status: Never Used  Substance and Sexual Activity   Alcohol use: Yes    Comment: once a month per pt   Drug use: No   Sexual activity: Yes    Partners: Male    Birth control/protection: Surgical  Other Topics Concern   Not on file  Social History Narrative   Not on file   Social Drivers of Health   Financial Resource Strain: Low Risk  (10/04/2023)   Received from [facility]   Overall Financial Resource Strain (CARDIA)    Difficulty of Paying Living Expenses: Not hard at all  Food Insecurity: No Food Insecurity (10/04/2023)   [facility]   Hunger Vital Sign    Worried About Running Out of Food in the Last Year: Never true    Ran Out of Food in the Last Year: Never true  Transportation Needs: No Transportation Needs (10/04/2023)   Received from [facility] - [facility]    Lack of Transportation (Medical): No    Lack of Transportation (Non-Medical): No  Physical Activity: Not on file  Stress: Not on file  Social Connections: Unknown (01/27/2022)   [facility]   Social Network    Social Network: Not on file    Allergies:  Allergies  Allergen Reactions   Metrogel [Metronidazole] Other (See Comments)    Burn face   Tetracyclines & Related Rash    Metabolic Disorder Labs: Lab Results  Component Value Date   HGBA1C 5.5 09/04/2020   MPG 108 06/10/2015   No results found for: "PROLACTIN" Lab Results  Component Value Date   CHOL 205 (H)  12/25/2020   TRIG 76.0 12/25/2020   HDL 73.30 12/25/2020   CHOLHDL 3 12/25/2020   VLDL 15.2 12/25/2020   LDLCALC 116 (H) 12/25/2020   LDLCALC 122 (H) 09/04/2020   Lab Results  Component Value Date   TSH 0.05 (L) 05/28/2021   TSH 1.32 03/08/2021    Therapeutic Level Labs: Lab Results  Component Value Date   LITHIUM 1.2 11/08/2018   LITHIUM 0.80 06/10/2015  No results found for: "VALPROATE" No results found for: "CBMZ"  Current Medications: Current Outpatient Medications  Medication Sig Dispense Refill   Vilazodone HCl (VIIBRYD) 10 MG TABS Take 1 tablet (10 mg total) by mouth daily. 30 tablet 1   Calcium Carbonate-Vitamin D (CALTRATE 600+D PO) Take 2 tablets by mouth daily.     [START ON 11/24/2023] lamoTRIgine (LAMICTAL) 25 MG tablet Take 2 tablets (50 mg total) by mouth daily. 180 tablet 1   levothyroxine (SYNTHROID) 150 MCG tablet Take 1 tablet (150 mcg total) by mouth daily. 90 tablet 1   levothyroxine (SYNTHROID) 175 MCG tablet TAKE 1 TABLET BY MOUTH EVERY DAY (Patient not taking: Reported on 10/10/2023) 90 tablet 0   Multiple Vitamin (MULTIVITAMIN WITH MINERALS) TABS tablet Take 1 tablet by mouth daily.     Omega-3 Fatty Acids (FISH OIL) 1000 MG CAPS Take 2,000 mg by mouth daily.     QUEtiapine (SEROQUEL) 100 MG tablet Take 1 tablet (100 mg total) by mouth at bedtime. Total of 175 mg at night. Take along with 50 mg and 25 mg tab 90 tablet 1   [START ON 01/23/2024] QUEtiapine (SEROQUEL) 25 MG tablet Take 1 tablet (25 mg total) by mouth at bedtime. Total of 175 mg at night. Take along with 100 mg, 50 mg tab 90 tablet 0   QUEtiapine (SEROQUEL) 50 MG tablet Take 1 tablet (50 mg total) by mouth at bedtime. Total of 175 mg at night. Take along with 100 mg and 50 mg tab 90 tablet 1   Vitamin D, Ergocalciferol, (DRISDOL) 1.25 MG (50000 UNIT) CAPS capsule Take 1 capsule (50,000 Units total) by mouth every 7 (seven) days. 12 capsule 0   No current facility-administered medications for  this visit.     Musculoskeletal: Strength & Muscle Tone:  N/A Gait & Station:  N/A Patient leans: N/A  Psychiatric Specialty Exam: Review of Systems  Psychiatric/Behavioral:  Positive for dysphoric mood. Negative for agitation, behavioral problems, confusion, decreased concentration, hallucinations, self-injury, sleep disturbance and suicidal ideas. The patient is not nervous/anxious and is not hyperactive.   All other systems reviewed and are negative.   There were no vitals taken for this visit.There is no height or weight on file to calculate BMI.  General Appearance: Well Groomed  Eye Contact:  Good  Speech:  Clear and Coherent  Volume:  Normal  Mood:   not good  Affect:  Appropriate, Congruent, and Full Range  Thought Process:  Coherent  Orientation:  Full (Time, Place, and Person)  Thought Content: Logical   Suicidal Thoughts:  No  Homicidal Thoughts:  No  Memory:  Immediate;   Good  Judgement:  Good  Insight:  Good  Psychomotor Activity:  Normal  Concentration:  Concentration: Good and Attention Span: Good  Recall:  Good  Fund of Knowledge: Good  Language: Good  Akathisia:  No  Handed:  Right  AIMS (if indicated): not done  Assets:  Communication Skills Desire for Improvement  ADL's:  Intact  Cognition: WNL  Sleep:  Good   Screenings: PHQ2-9    Flowsheet Row Video Visit from 10/12/2021 in [facility] Office Visit from 07/12/2021 in [facility] Video Visit from 02/11/2021 in [facility] Video Visit from 11/12/2020 in [facility] Office Visit from 12/13/2019 in [facility] Total Score 0 4 0 0 0  PHQ-9 Total Score --  11 -- -- 6      Flowsheet Row Video Visit from 10/12/2021 in [facility] Video Visit from 07/21/2021 in [facility] Office Visit from 07/12/2021 in [facility]  C-SSRS RISK CATEGORY No Risk Error: Q3, 4, or 5 should not be populated when Q2 is No High Risk        Assessment and Plan:  Miranda Harris is a 63 y.o. 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   1. Bipolar 1 disorder (HCC) 2. Insomnia, unspecified type Acute stressors include: declining in the condition of her dog,  Other stressors include:    History: admitted at age 66 due to mania, psychosis, depression, another episode at age 32 due to non adherence. Originally on lithium 300 mg QID, trifluoperazine 2 mg qhs, lexapro 20 mg daily, xanax 0.5 mg qhsprn    Who has been worsening in depressive symptoms in the context of running out of Trintellix due to issues with insurance.  She had an adverse reaction from Lexapro.  Will try Vilazodone to target bipolar depression, off label.  Discussed potential risk of nausea.  Will continue lamotrigine for bipolar disorder.  Will continue quetiapine for bipolar disorder and insomnia.    # hypothyroidism She was seen by her PCP, and levothyroxine level was tapered down.   # high risk medication use   Last checked  EKG HR 64, QTc 389 msec 10/2022  Lipid panels LDL 117  05/2023  HbA1c Glu 88 09/2023      Plan:   Continue lamotrigine 50 mg daily  (dizziness from 100 mg ) - she declined a refill Continue quetiapine 175 mg at night   Start Vilazodone 10 mg daily  Next appointment: 4/9 at 2 PM. IP - vitamin D was within limits 2023    Past trials of medication: Sertraline, fluoxetine (drowsiness), Lexapro (nausea, brain fog),  Duloxetine, venlafaxine (initially worked well, stopped working, hair loss), bupropion (insomnia),  Paxil (urinary  retention), Latuda, Abilify (flu like symptoms), Haldol (EPS), Thorazine (EPS), Stelazine,  Lithium, Lunesta (sedation), Ambien (sleepwalking, driving, eating), Trazodone (excessive sedation), Xanax    The patient demonstrates the following  risk factors for suicide: Chronic risk factors for suicide include psychiatric disorder /bipolar disorder, history of substance use disorder, family history of suicide attempt. Acute risk factors for suicide include none. Protective factors for this patient include positive social support, responsibility to others (family), coping skills, hope for the future. The overall suicide risk at this point appears to be low, and she is appropriate for outpatient follow up.     Collaboration of Care: Collaboration of Care: Other reviewed notes in Epic  Patient/Guardian was advised Release of Information must be obtained prior to any record release in order to collaborate their care with an outside provider. Patient/Guardian was advised if they have not already done so to contact the registration department to sign all necessary forms in order for [place] to release information regarding their care.   Consent: Patient/Guardian gives verbal consent for treatment and assignment of benefits for services provided during this visit. Patient/Guardian expressed understanding and agreed to proceed.    Neysa Hotter, MD 11/14/2023, 4:10 PM

## 2023-11-14 ENCOUNTER — Telehealth (INDEPENDENT_AMBULATORY_CARE_PROVIDER_SITE_OTHER): Payer: Medicare Other | Admitting: Psychiatry

## 2023-11-14 ENCOUNTER — Encounter: Payer: Self-pay | Admitting: Psychiatry

## 2023-11-14 DIAGNOSIS — F319 Bipolar disorder, unspecified: Secondary | ICD-10-CM | POA: Diagnosis not present

## 2023-11-14 DIAGNOSIS — G4700 Insomnia, unspecified: Secondary | ICD-10-CM | POA: Diagnosis not present

## 2023-11-14 MED ORDER — LAMOTRIGINE 25 MG PO TABS: 50.0000 mg | ORAL_TABLET | Freq: Every day | ORAL | 1 refills | Status: DC

## 2023-11-14 MED ORDER — VILAZODONE HCL 10 MG PO TABS: 10.0000 mg | ORAL_TABLET | Freq: Every day | ORAL | 1 refills | Status: DC

## 2023-11-14 MED ORDER — QUETIAPINE FUMARATE 25 MG PO TABS: 25.0000 mg | ORAL_TABLET | Freq: Every day | ORAL | 0 refills | Status: DC

## 2023-11-14 NOTE — Patient Instructions (Signed)
Continue lamotrigine 50 mg daily Continue quetiapine 175 mg at night   Start vilazodone 10 mg daily  Next appointment: 4/9 at 2 PM

## 2023-11-22 ENCOUNTER — Other Ambulatory Visit: Payer: Self-pay | Admitting: Cardiovascular Disease

## 2023-11-22 DIAGNOSIS — Z1231 Encounter for screening mammogram for malignant neoplasm of breast: Secondary | ICD-10-CM

## 2023-11-26 ENCOUNTER — Other Ambulatory Visit: Payer: Self-pay | Admitting: Psychiatry

## 2023-11-30 ENCOUNTER — Ambulatory Visit
Admission: RE | Admit: 2023-11-30 | Discharge: 2023-11-30 | Disposition: A | Discharge status: Home / Self Care | Payer: Medicare Other | Source: Ambulatory Visit | Attending: Cardiovascular Disease | Admitting: Cardiovascular Disease

## 2023-11-30 DIAGNOSIS — Z1231 Encounter for screening mammogram for malignant neoplasm of breast: Secondary | ICD-10-CM

## 2023-12-30 NOTE — Progress Notes (Unsigned)
 Virtual Visit via Video Note  I connected with Miranda Harris on 01/03/24 at  2:00 PM EDT by a video enabled telemedicine application and verified that I am speaking with the correct person using two identifiers.  Location: Patient: home Provider: office Persons participated in the visit- patient, provider    I discussed the limitations of evaluation and management by telemedicine and the availability of in person appointments. The patient expressed understanding and agreed to proceed.    I discussed the assessment and treatment plan with the patient. The patient was provided an opportunity to ask questions and all were answered. The patient agreed with the plan and demonstrated an understanding of the instructions.   The patient was advised to call back or seek an in-person evaluation if the symptoms worsen or if the condition fails to improve as anticipated.   Neysa Hotter, MD      [facility] MD/PA/NP OP Progress Note  01/03/2024 2:31 PM Miranda Harris  MRN:  [id number]  Chief Complaint:  Chief Complaint  Patient presents with   Follow-up   HPI:  This is a follow-up appointment for bipolar disorder and insomnia.  She states that she has been doing good.  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  She started to have a dog, who is one year old, 15 pounds. Although it has been an adjustment, this is like her having grandchild.  It is nice to have someone around.  Her husband will work for [employer].  She is not consenting to work anymore.  She spends more time with her friends.  She believes her energy is up, and her concentration is good.  Although she has less appetite, she denies concern about this.  She denies SI.  She denies decreased need for sleep or euphoria.  Although she feels concerned about the current politics, there is nothing she can do, and she denies any significant distress.  She has been taking lamotrigine twice a day.  She takes  vilazodone when she wakes up in the middle of the night.  In this way, she feels less drowsy during the day.  She acknowledges that vilazodone works better when taking along with meals.  She agrees with the plan as outlined below.    Weight: 195 lb 8 oz (88.7 kg)  09-2023 Wt Readings from Last 3 Encounters:  07/12/21 192 lb 12.8 oz (87.5 kg)  06/10/21 186 lb 14.4 oz (84.8 kg)  05/28/21 188 lb 14.4 oz (85.7 kg)     Substance use   Tobacco Alcohol Other substances/  Current   Denies- cannot drink denies  Past   Used to drink a good amount when she was in [employer], cocaine, occasionally when she was in college, high school  Past Treatment            Visit Diagnosis:    ICD-10-CM   1. Bipolar 1 disorder (HCC)  F31.9     2. Insomnia, unspecified type  G47.00       Past Psychiatric History: Please see initial evaluation for full details. I have reviewed the history. No updates at this time.     Past Medical History:  Past Medical History:  Diagnosis Date   Bipolar disorder (HCC)    Blood transfusion without reported diagnosis    Chronic kidney disease    Depression    Elevated serum creatinine    related to lithium use   Helicobacter pylori (H. pylori) infection  pt denies   Hypothyroidism    Perirectal fistula    Thyroid disease    Trochanteric bursitis of right hip     Past Surgical History:  Procedure Laterality Date   COLONOSCOPY  01/06/2016   at [facility]   EVALUATION UNDER ANESTHESIA WITH HEMORRHOIDECTOMY N/A 06/28/2017   Procedure: EXAM UNDER ANESTHESIA WITH EXTERNAL HEMORRHOIDECTOMY AND REPAIR OF PERIRECTAL FISTULA;  Surgeon: Karie Soda, MD;  Location: [facility];  Service: General;  Laterality: N/A;   HERNIA REPAIR  8-10 yrs ago   umbilical hernia   INCISION AND DRAINAGE PERIRECTAL ABSCESS N/A 04/18/2017   Procedure: IRRIGATION AND DEBRIDEMENT PERIRECTAL ABSCESS, placement of seton, sigmoidoscopy;  Surgeon: Glenna Fellows, MD;  Location:  [facility];  Service: General;  Laterality: N/A;   LAPAROSCOPIC APPENDECTOMY     POLYPECTOMY     VAGINAL HYSTERECTOMY Bilateral    complete   WISDOM TOOTH EXTRACTION      Family Psychiatric History: Please see initial evaluation for full details. I have reviewed the history. No updates at this time.     Family History:  Family History  Problem Relation Age of Onset   Anxiety disorder Brother    Breast cancer Neg Hx    Colon cancer Neg Hx    Colon polyps Neg Hx    Esophageal cancer Neg Hx    Rectal cancer Neg Hx    Stomach cancer Neg Hx     Social History:  Social History   Socioeconomic History   Marital status: Married    Spouse name: Not on file   Number of children: Not on file   Years of education: Not on file   Highest education level: Not on file  Occupational History   Not on file  Tobacco Use   Smoking status: Former    Current packs/day: 1.00    Average packs/day: 1 pack/day for 30.0 years (30.0 ttl pk-yrs)    Types: Cigarettes   Smokeless tobacco: Never   Tobacco comments:    quit 2008  Vaping Use   Vaping status: Never Used  Substance and Sexual Activity   Alcohol use: Yes    Comment: once a month per pt   Drug use: No   Sexual activity: Yes    Partners: Male    Birth control/protection: Surgical  Other Topics Concern   Not on file  Social History Narrative   Not on file   Social Drivers of Health   Financial Resource Strain: Low Risk  (10/04/2023)   Received from [facility]   Overall Financial Resource Strain (CARDIA)    Difficulty of Paying Living Expenses: Not hard at all  Food Insecurity: No Food Insecurity (10/04/2023)   Received from [facility]   Hunger Vital Sign    Worried About Running Out of Food in the Last Year: Never true    Ran Out of Food in the Last Year: Never true  Transportation Needs: No Transportation Needs (10/04/2023)   Received from [facility] - Transportation    Lack of Transportation (Medical): No     Lack of Transportation (Non-Medical): No  Physical Activity: Not on file  Stress: Not on file  Social Connections: Unknown (01/27/2022)   Received from [facility]   Social Network    Social Network: Not on file    Allergies:  Allergies  Allergen Reactions   Metrogel [Metronidazole] Other (See Comments)    Burn face   Tetracyclines & Related Rash  Metabolic Disorder Labs: Lab Results  Component Value Date   HGBA1C 5.5 09/04/2020   MPG 108 06/10/2015   No results found for: "PROLACTIN" Lab Results  Component Value Date   CHOL 205 (H) 12/25/2020   TRIG 76.0 12/25/2020   HDL 73.30 12/25/2020   CHOLHDL 3 12/25/2020   VLDL 15.2 12/25/2020   LDLCALC 116 (H) 12/25/2020   LDLCALC 122 (H) 09/04/2020   Lab Results  Component Value Date   TSH 0.05 (L) 05/28/2021   TSH 1.32 03/08/2021    Therapeutic Level Labs: Lab Results  Component Value Date   LITHIUM 1.2 11/08/2018   LITHIUM 0.80 06/10/2015   No results found for: "VALPROATE" No results found for: "CBMZ"  Current Medications: Current Outpatient Medications  Medication Sig Dispense Refill   Calcium Carbonate-Vitamin D (CALTRATE 600+D PO) Take 2 tablets by mouth daily.     lamoTRIgine (LAMICTAL) 25 MG tablet Take 2 tablets (50 mg total) by mouth daily. 180 tablet 1   levothyroxine (SYNTHROID) 150 MCG tablet Take 1 tablet (150 mcg total) by mouth daily. 90 tablet 1   levothyroxine (SYNTHROID) 175 MCG tablet TAKE 1 TABLET BY MOUTH EVERY DAY (Patient not taking: Reported on 10/10/2023) 90 tablet 0   Multiple Vitamin (MULTIVITAMIN WITH MINERALS) TABS tablet Take 1 tablet by mouth daily.     Omega-3 Fatty Acids (FISH OIL) 1000 MG CAPS Take 2,000 mg by mouth daily.     QUEtiapine (SEROQUEL) 100 MG tablet Take 1 tablet (100 mg total) by mouth at bedtime. Total of 175 mg at night. Take along with 50 mg and 25 mg tab 90 tablet 1   [START ON 01/23/2024] QUEtiapine (SEROQUEL) 25 MG tablet Take 1 tablet (25 mg  total) by mouth at bedtime. Total of 175 mg at night. Take along with 100 mg, 50 mg tab 90 tablet 0   QUEtiapine (SEROQUEL) 50 MG tablet Take 1 tablet (50 mg total) by mouth at bedtime. Total of 175 mg at night. Take along with 100 mg and 50 mg tab 90 tablet 1   [START ON 01/13/2024] Vilazodone HCl (VIIBRYD) 10 MG TABS Take 1 tablet (10 mg total) by mouth daily. 90 tablet 0   Vitamin D, Ergocalciferol, (DRISDOL) 1.25 MG (50000 UNIT) CAPS capsule Take 1 capsule (50,000 Units total) by mouth every 7 (seven) days. 12 capsule 0   No current facility-administered medications for this visit.     Musculoskeletal: Strength & Muscle Tone:  N/A Gait & Station:  N/A Patient leans: N/A  Psychiatric Specialty Exam: Review of Systems  Psychiatric/Behavioral: Negative.    All other systems reviewed and are negative.   There were no vitals taken for this visit.There is no height or weight on file to calculate BMI.  General Appearance: Well Groomed  Eye Contact:  Good  Speech:  Clear and Coherent  Volume:  Normal  Mood:   good  Affect:  Appropriate, Congruent, and Full Range  Thought Process:  Coherent  Orientation:  Full (Time, Place, and Person)  Thought Content: Logical   Suicidal Thoughts:  No  Homicidal Thoughts:  No  Memory:  Immediate;   Good  Judgement:  Good  Insight:  Good  Psychomotor Activity:  Normal  Concentration:  Concentration: Good and Attention Span: Good  Recall:  Good  Fund of Knowledge: Good  Language: Good  Akathisia:  No  Handed:  Right  AIMS (if indicated): not done  Assets:  Communication Skills Desire for Improvement  ADL's:  Intact  Cognition: WNL  Sleep:  Good   Screenings: PHQ2-9    Flowsheet Row Video Visit from 10/12/2021 in [facility] Office Visit from 07/12/2021 in [facility] Video Visit from 02/11/2021 in [facility] Video Visit  from 11/12/2020 in [facility] Office Visit from 12/13/2019 in [facility] Total Score 0 4 0 0 0  PHQ-9 Total Score -- 11 -- -- 6      Flowsheet Row Video Visit from 10/12/2021 in [facility] Video Visit from 07/21/2021 in [facility] Office Visit from 07/12/2021 in [facility]  C-SSRS RISK CATEGORY No Risk Error: Q3, 4, or 5 should not be populated when Q2 is No High Risk        Assessment and Plan:  Miranda Harris is a 63 y.o. 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   1. Bipolar 1 disorder (HCC) 2. Insomnia, unspecified type Acute stressors include: declining in the condition of her dog,  Other stressors include:    History: admitted at age 12 due to mania, psychosis, depression, another episode at age 42 due to non adherence. Originally on lithium 300 mg QID, trifluoperazine 2 mg qhs, lexapro 20 mg daily, xanax 0.5 mg qhsprn     Has been significant improvement in depressive symptoms and insomnia since starting Viibryd.  Will continue current dose to target bipolar depression, off label.  Will continue lamotrigine for bipolar disorder.  Will continue quetiapine for bipolar disorder and insomnia.    # high risk medication use     Last checked  EKG HR 64, QTc 389 msec 10/2022  Lipid panels LDL 117  05/2023  HbA1c Glu 88 09/2023      Plan:   Continue lamotrigine 25 mg twice a day  (dizziness from 100 mg ) - she declined a refill Continue quetiapine 175 mg at night   Continue Vilazodone 10 mg daily  Next appointment: 5/23 at 11 am, video - vitamin D was within limits 2023    Past trials of medication:  Sertraline, fluoxetine (drowsiness), Lexapro (nausea, brain fog),  Duloxetine, venlafaxine (initially worked well, stopped working, hair loss), bupropion (insomnia),  Paxil (urinary retention), Latuda, Abilify (flu like symptoms), Haldol (EPS), Thorazine (EPS), Stelazine,  Lithium, Lunesta (sedation), Ambien (sleepwalking, driving, eating), Trazodone (excessive sedation), Xanax    The patient demonstrates the following  risk factors for suicide: Chronic risk factors for suicide include psychiatric disorder /bipolar disorder, history of substance use disorder, family history of suicide attempt. Acute risk factors for suicide include none. Protective factors for this patient include positive social support, responsibility to others (family), coping skills, hope for the future. The overall suicide risk at this point appears to be low, and she is appropriate for outpatient follow up.   Collaboration of Care: Collaboration of Care: Other reviewed notes in Epic  Patient/Guardian was advised Release of Information must be obtained prior to any record release in order to collaborate their care with an outside provider. Patient/Guardian was advised if they have not already done so to contact the registration department to sign all necessary forms in order for [place] to release information regarding their care.   Consent: Patient/Guardian gives verbal  consent for treatment and assignment of benefits for services provided during this visit. Patient/Guardian expressed understanding and agreed to proceed.    Neysa Hotter, MD 01/03/2024, 2:31 PM

## 2024-01-03 ENCOUNTER — Telehealth (INDEPENDENT_AMBULATORY_CARE_PROVIDER_SITE_OTHER): Payer: Medicare Other | Admitting: Psychiatry

## 2024-01-03 ENCOUNTER — Encounter: Payer: Self-pay | Admitting: Psychiatry

## 2024-01-03 DIAGNOSIS — F319 Bipolar disorder, unspecified: Secondary | ICD-10-CM | POA: Diagnosis not present

## 2024-01-03 DIAGNOSIS — G4700 Insomnia, unspecified: Secondary | ICD-10-CM | POA: Diagnosis not present

## 2024-01-03 MED ORDER — QUETIAPINE FUMARATE 25 MG PO TABS: 25.0000 mg | ORAL_TABLET | Freq: Every day | ORAL | 0 refills | Status: DC

## 2024-01-03 MED ORDER — VILAZODONE HCL 10 MG PO TABS: 10.0000 mg | ORAL_TABLET | Freq: Every day | ORAL | 0 refills | Status: DC

## 2024-01-03 NOTE — Patient Instructions (Signed)
 Continue lamotrigine 25 mg twice a day Continue quetiapine 175 mg at night   Continue Vilazodone 10 mg daily  Next appointment: 5/23 at 11 am

## 2024-02-11 NOTE — Progress Notes (Signed)
 Virtual Visit via Video Note  I connected with Miranda Harris on 02/16/24 at 11:00 AM EDT by a video enabled telemedicine application and verified that I am speaking with the correct person using two identifiers.  Location: Patient: home Provider: home office Persons participated in the visit- patient, provider    I discussed the limitations of evaluation and management by telemedicine and the availability of in person appointments. The patient expressed understanding and agreed to proceed.    I discussed the assessment and treatment plan with the patient. The patient was provided an opportunity to ask questions and all were answered. The patient agreed with the plan and demonstrated an understanding of the instructions.   The patient was advised to call back or seek an in-person evaluation if the symptoms worsen or if the condition fails to improve as anticipated.    Todd Fossa, MD    [facility] MD/PA/NP OP Progress Note  02/16/2024 12:28 PM Miranda Harris  MRN:  [id number]  Chief Complaint:  Chief Complaint  Patient presents with   Follow-up   HPI:  This is a follow-up appointment for bipolar disorder, insomnia.  She apologized for being late for the appointment as she was doing the yard work. She states that she has been feeling depressed, feeling exhausted until a few days ago.  She talks about the recent stress of taking care of her dog.  It has been too much.  Although she brought her dog to the [employer], he had to bring her dog back.  Although she always adapt to the new environment, she is now concerning increase in up lamotrigine .  She has been snapping at the dog and her husband.  Her depression has been better over the past few days.  She has been involved in the church, and had a good picnic last weekend.  She enjoys being social.  She denies hallucinations.  She has occasional insomnia related to anxiety, although these are more manageable during the day.  She  denies SI.  She agrees with the plans as outlined below.   Substance use   Tobacco Alcohol Other substances/  Current   Denies- cannot drink denies  Past   Used to drink a good amount when she was in college Pot, cocaine, occasionally when she was in college, high school  Past Treatment           Visit Diagnosis: No diagnosis found.  Past Psychiatric History: Please see initial evaluation for full details. I have reviewed the history. No updates at this time.     Past Medical History:  Past Medical History:  Diagnosis Date   Bipolar disorder (HCC)    Blood transfusion without reported diagnosis    Chronic kidney disease    Depression    Elevated serum creatinine    related to lithium  use   Helicobacter pylori (H. pylori) infection    pt denies   Hypothyroidism    Perirectal fistula    Thyroid  disease    Trochanteric bursitis of right hip     Past Surgical History:  Procedure Laterality Date   COLONOSCOPY  01/06/2016   at [facility]   EVALUATION UNDER ANESTHESIA WITH HEMORRHOIDECTOMY N/A 06/28/2017   Procedure: EXAM UNDER ANESTHESIA WITH EXTERNAL HEMORRHOIDECTOMY AND REPAIR OF PERIRECTAL FISTULA;  Surgeon: Candyce Champagne, MD;  Location: [facility];  Service: General;  Laterality: N/A;   HERNIA REPAIR  8-10 yrs ago   umbilical hernia   INCISION AND DRAINAGE PERIRECTAL ABSCESS  N/A 04/18/2017   Procedure: IRRIGATION AND DEBRIDEMENT PERIRECTAL ABSCESS, placement of seton, sigmoidoscopy;  Surgeon: Ayesha Lente, MD;  Location: [facility];  Service: General;  Laterality: N/A;   LAPAROSCOPIC APPENDECTOMY     POLYPECTOMY     VAGINAL HYSTERECTOMY Bilateral    complete   WISDOM TOOTH EXTRACTION      Family Psychiatric History: Please see initial evaluation for full details. I have reviewed the history. No updates at this time.     Family History:  Family History  Problem Relation Age of Onset   Anxiety disorder Brother    Breast cancer Neg Hx    Colon cancer Neg  Hx    Colon polyps Neg Hx    Esophageal cancer Neg Hx    Rectal cancer Neg Hx    Stomach cancer Neg Hx     Social History:  Social History   Socioeconomic History   Marital status: Married    Spouse name: Not on file   Number of children: Not on file   Years of education: Not on file   Highest education level: Not on file  Occupational History   Not on file  Tobacco Use   Smoking status: Former    Current packs/day: 1.00    Average packs/day: 1 pack/day for 30.0 years (30.0 ttl pk-yrs)    Types: Cigarettes   Smokeless tobacco: Never   Tobacco comments:    quit 2008  Vaping Use   Vaping status: Never Used  Substance and Sexual Activity   Alcohol use: Yes    Comment: once a month per pt   Drug use: No   Sexual activity: Yes    Partners: Male    Birth control/protection: Surgical  Other Topics Concern   Not on file  Social History Narrative   Not on file   Social Drivers of Health   Financial Resource Strain: Low Risk  (10/04/2023)   Received from [facility]   Overall Financial Resource Strain (CARDIA)    Difficulty of Paying Living Expenses: Not hard at all  Food Insecurity: No Food Insecurity (10/04/2023)   Received from [facility]   Hunger Vital Sign    Worried About Running Out of Food in the Last Year: Never true    Ran Out of Food in the Last Year: Never true  Transportation Needs: No Transportation Needs (10/04/2023)   Received from [facility]    Lack of Transportation (Medical): No    Lack of Transportation (Non-Medical): No  Physical Activity: Not on file  Stress: Not on file  Social Connections: Unknown (01/27/2022)   Received from [facility]   Social Network    Social Network: Not on file    Allergies:  Allergies  Allergen Reactions   Metrogel  [Metronidazole ] Other (See Comments)    Burn face   Tetracyclines & Related Rash    Metabolic Disorder Labs: Lab Results  Component Value Date    HGBA1C 5.5 09/04/2020   MPG 108 06/10/2015   No results found for: "PROLACTIN" Lab Results  Component Value Date   CHOL 205 (H) 12/25/2020   TRIG 76.0 12/25/2020   HDL 73.30 12/25/2020   CHOLHDL 3 12/25/2020   VLDL 15.2 12/25/2020   LDLCALC 116 (H) 12/25/2020   LDLCALC 122 (H) 09/04/2020   Lab Results  Component Value Date   TSH 0.05 (L) 05/28/2021   TSH 1.32 03/08/2021    Therapeutic Level Labs: Lab Results  Component Value  Date   LITHIUM  1.2 11/08/2018   LITHIUM  0.80 06/10/2015   No results found for: "VALPROATE" No results found for: "CBMZ"  Current Medications: Current Outpatient Medications  Medication Sig Dispense Refill   Calcium Carbonate-Vitamin D  (CALTRATE 600+D PO) Take 2 tablets by mouth daily.     lamoTRIgine  (LAMICTAL ) 25 MG tablet Take 2 tablets (50 mg total) by mouth daily. (Patient taking differently: Take 50 mg by mouth daily. 50 mg in AM. 25 mg in PM) 180 tablet 1   levothyroxine  (SYNTHROID ) 150 MCG tablet Take 1 tablet (150 mcg total) by mouth daily. 90 tablet 1   levothyroxine  (SYNTHROID ) 175 MCG tablet TAKE 1 TABLET BY MOUTH EVERY DAY (Patient not taking: Reported on 10/10/2023) 90 tablet 0   Multiple Vitamin (MULTIVITAMIN WITH MINERALS) TABS tablet Take 1 tablet by mouth daily.     Omega-3 Fatty Acids (FISH OIL) 1000 MG CAPS Take 2,000 mg by mouth daily.     [START ON 03/02/2024] QUEtiapine  (SEROQUEL ) 100 MG tablet Take 1 tablet (100 mg total) by mouth at bedtime. Total of 175 mg at night. Take along with 50 mg and 25 mg tab 90 tablet 1   QUEtiapine  (SEROQUEL ) 25 MG tablet Take 1 tablet (25 mg total) by mouth at bedtime. Total of 175 mg at night. Take along with 100 mg, 50 mg tab 90 tablet 0   [START ON 03/02/2024] QUEtiapine  (SEROQUEL ) 50 MG tablet Take 1 tablet (50 mg total) by mouth at bedtime. Total of 175 mg at night. Take along with 100 mg and 50 mg tab 90 tablet 1   Vilazodone  HCl (VIIBRYD ) 10 MG TABS Take 1 tablet (10 mg total) by mouth daily. 90  tablet 0   Vitamin D , Ergocalciferol , (DRISDOL ) 1.25 MG (50000 UNIT) CAPS capsule Take 1 capsule (50,000 Units total) by mouth every 7 (seven) days. 12 capsule 0   No current facility-administered medications for this visit.     Musculoskeletal: Strength & Muscle Tone: N/A Gait & Station: N/A Patient leans: N/A  Psychiatric Specialty Exam: Review of Systems  Psychiatric/Behavioral:  Positive for dysphoric mood and sleep disturbance. Negative for agitation, behavioral problems, confusion, decreased concentration, hallucinations, self-injury and suicidal ideas. The patient is not nervous/anxious and is not hyperactive.   All other systems reviewed and are negative.   There were no vitals taken for this visit.There is no height or weight on file to calculate BMI.  General Appearance: Well Groomed  Eye Contact:  Good  Speech:  Clear and Coherent  Volume:  Normal  Mood:  Depressed  Affect:  Appropriate, Congruent, and Full Range  Thought Process:  Coherent  Orientation:  Full (Time, Place, and Person)  Thought Content: Logical   Suicidal Thoughts:  No  Homicidal Thoughts:  No  Memory:  Immediate;   Good  Judgement:  Good  Insight:  Good  Psychomotor Activity:  Normal  Concentration:  Concentration: Good and Attention Span: Good  Recall:  Good  Fund of Knowledge: Good  Language: Good  Akathisia:  No  Handed:  Right  AIMS (if indicated): not done  Assets:  Communication Skills Desire for Improvement  ADL's:  Intact  Cognition: WNL  Sleep:  Fair   Screenings: PHQ2-9    Flowsheet Row Video Visit from 10/12/2021 in [facility] Office Visit from 07/12/2021 in [facility] Video Visit from 02/11/2021 in [facility] Video Visit from 11/12/2020 in [facility] Office Visit from 12/13/2019 in [facility] Total Score 0 4 0 0 0  PHQ-9 Total Score -- 11 -- -- 6      Flowsheet Row Video Visit from 10[facility]12/2021 in [facility] Video Visit from 07/21/2021 in [facility] Office Visit from 07/12/2021 in [facility]  C-SSRS RISK CATEGORY No Risk Error: Q3, 4, or 5 should not be populated when Q2 is No High Risk        Assessment and Plan:  Miranda Harris is a 63 y.o. 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   1. Bipolar 1 disorder (HCC) 2. Insomnia, unspecified type Acute stressors include: declining in the condition of her dog,  Other stressors include:    History: admitted at age 6 due to mania, psychosis, depression, another episode at age 68 due to non adherence. Originally on lithium  300 mg QID, trifluoperazine  2 mg qhs, lexapro  20 mg daily, xanax  0.5 mg qhsprn      There has been worsening in depressive symptoms, exhaustion, irritability since the previous visit.  We uptitrate lamotrigine  to optimize treatment for bipolar depression.  Discussed potential risk of Stevens-Johnson syndrome.  Will continue quetiapine  to target bipolar disorder, insomnia.  Will continue Viibryd  for bipolar depression, off label.  She agrees to contact the office if any worsening in her mood symptoms.    # high risk medication use     Last checked  EKG HR 64, QTc 389 msec 10/2022  Lipid panels LDL 117  05/2023  HbA1c Glu 88 09/2023      Plan:   Increase lamotrigine  50  mg daily, 25 mg at night (previously reported dizziness from 100 mg, although she now attributes it to other medication ) - uptitrated 01/2024 Continue quetiapine  175 mg at night   Continue Vilazodone  10 mg daily  Next appointment:  6/24 at 1 30, video - vitamin D  was within limits 2023    Past trials of medication: Sertraline, fluoxetine  (drowsiness), Lexapro  (nausea, brain fog),  Duloxetine, venlafaxine  (initially worked well, stopped working, hair loss), bupropion (insomnia),  Paxil  (urinary retention), Latuda , Abilify (flu like symptoms), Haldol (EPS), Thorazine (EPS), Stelazine ,  Lithium , Lunesta (sedation), Ambien (sleepwalking, driving, eating), Trazodone  (excessive sedation), Xanax     The patient demonstrates the following  risk factors for suicide: Chronic risk factors for suicide include psychiatric disorder /bipolar disorder, history of substance use disorder, family history of suicide attempt. Acute risk factors for suicide include none. Protective factors for this patient include positive social support, responsibility to others (family), coping skills, hope for the future. The overall suicide risk at this point appears to be low, and she is appropriate for outpatient follow up.     Collaboration of Care: Collaboration of Care: Other reviewed notes in epic  Patient/Guardian was advised Release of Information must be obtained prior to any record release in order to collaborate their care with an outside provider. Patient/Guardian was advised if they have not already done so to contact the registration department to sign all necessary forms in order for us  to release information regarding their care.   Consent: Patient/Guardian gives verbal consent for treatment and assignment of benefits for services provided during this visit. Patient/Guardian expressed understanding and agreed to proceed.  Todd Fossa, MD 02/16/2024, 12:28 PM

## 2024-02-16 ENCOUNTER — Telehealth (INDEPENDENT_AMBULATORY_CARE_PROVIDER_SITE_OTHER): Admitting: Psychiatry

## 2024-02-16 ENCOUNTER — Encounter: Payer: Self-pay | Admitting: Psychiatry

## 2024-02-16 DIAGNOSIS — G4700 Insomnia, unspecified: Secondary | ICD-10-CM | POA: Diagnosis not present

## 2024-02-16 DIAGNOSIS — F319 Bipolar disorder, unspecified: Secondary | ICD-10-CM

## 2024-02-16 MED ORDER — QUETIAPINE FUMARATE 100 MG PO TABS: 100.0000 mg | ORAL_TABLET | Freq: Every day | ORAL | 1 refills | Status: DC

## 2024-02-16 MED ORDER — QUETIAPINE FUMARATE 50 MG PO TABS: 50.0000 mg | ORAL_TABLET | Freq: Every day | ORAL | 1 refills | Status: DC

## 2024-02-16 NOTE — Patient Instructions (Signed)
 Increase lamotrigine  50  mg daily, 25 mg at night  Continue quetiapine  175 mg at night   Continue Vilazodone  10 mg daily  Next appointment: 6/24 at 1 30

## 2024-02-27 ENCOUNTER — Other Ambulatory Visit: Payer: Self-pay | Admitting: Psychiatry

## 2024-02-27 ENCOUNTER — Telehealth: Payer: Self-pay

## 2024-02-27 MED ORDER — LAMOTRIGINE 25 MG PO TABS: ORAL_TABLET | ORAL | 0 refills | Status: DC

## 2024-02-27 NOTE — Telephone Encounter (Signed)
 I assume you are referring to lamotrigine  order. Ordered. Please contact them to cancel the previous order of lamotrigine  25 mg, 180 tabs.

## 2024-02-27 NOTE — Telephone Encounter (Signed)
 received fax that states that they need a new rx for 3 tablets a day. states that the rx has two different instructions.

## 2024-02-29 NOTE — Telephone Encounter (Signed)
 previous order canceled

## 2024-03-14 NOTE — Progress Notes (Unsigned)
 [facility] MD/PA/NP OP Progress Note  03/14/2024 9:14 AM Miranda Harris  MRN:  [id number]  Chief Complaint: No chief complaint on file.  HPI: ***  Blood Pressure 120/74 02/23/2024 9:24 AM EDT    Pulse 77 02/23/2024 9:24 AM EDT    Temperature 36.1 C (97 F) 02/23/2024 9:24 AM EDT    Respiratory Rate - -    Oxygen Saturation 99% 02/23/2024 9:24 AM EDT    Inhaled Oxygen Concentration - -    Weight 87.6 kg (193 lb 3.2 oz) 02/23/2024 9:24 AM EDT      Substance use   Tobacco Alcohol Other substances/  Current   Denies- cannot drink denies  Past   Used to drink a good amount when she was in college Pot, cocaine, occasionally when she was in college, high school  Past Treatment            Visit Diagnosis: No diagnosis found.  Past Psychiatric History: Please see initial evaluation for full details. I have reviewed the history. No updates at this time.     Past Medical History:  Past Medical History:  Diagnosis Date   Bipolar disorder (HCC)    Blood transfusion without reported diagnosis    Chronic kidney disease    Depression    Elevated serum creatinine    related to lithium  use   Helicobacter pylori (H. pylori) infection    pt denies   Hypothyroidism    Perirectal fistula    Thyroid  disease    Trochanteric bursitis of right hip     Past Surgical History:  Procedure Laterality Date   COLONOSCOPY  01/06/2016   at [facility]   EVALUATION UNDER ANESTHESIA WITH HEMORRHOIDECTOMY N/A 06/28/2017   Procedure: EXAM UNDER ANESTHESIA WITH EXTERNAL HEMORRHOIDECTOMY AND REPAIR OF PERIRECTAL FISTULA;  Surgeon: Candyce Champagne, MD;  Location: [facility];  Service: General;  Laterality: N/A;   HERNIA REPAIR  8-10 yrs ago   umbilical hernia   INCISION AND DRAINAGE PERIRECTAL ABSCESS N/A 04/18/2017   Procedure: IRRIGATION AND DEBRIDEMENT PERIRECTAL ABSCESS, placement of seton, sigmoidoscopy;  Surgeon: Ayesha Lente, MD;  Location: [facility];  Service: General;  Laterality: N/A;    LAPAROSCOPIC APPENDECTOMY     POLYPECTOMY     VAGINAL HYSTERECTOMY Bilateral    complete   WISDOM TOOTH EXTRACTION      Family Psychiatric History: Please see initial evaluation for full details. I have reviewed the history. No updates at this time.     Family History:  Family History  Problem Relation Age of Onset   Anxiety disorder Brother    Breast cancer Neg Hx    Colon cancer Neg Hx    Colon polyps Neg Hx    Esophageal cancer Neg Hx    Rectal cancer Neg Hx    Stomach cancer Neg Hx     Social History:  Social History   Socioeconomic History   Marital status: Married    Spouse name: Not on file   Number of children: Not on file   Years of education: Not on file   Highest education level: Not on file  Occupational History   Not on file  Tobacco Use   Smoking status: Former    Current packs/day: 1.00    Average packs/day: 1 pack/day for 30.0 years (30.0 ttl pk-yrs)    Types: Cigarettes   Smokeless tobacco: Never   Tobacco comments:    quit 2008  Vaping Use   Vaping status: Never Used  Substance  and Sexual Activity   Alcohol use: Yes    Comment: once a month per pt   Drug use: No   Sexual activity: Yes    Partners: Male    Birth control/protection: Surgical  Other Topics Concern   Not on file  Social History Narrative   Not on file   Social Drivers of Health   Financial Resource Strain: Low Risk  (02/23/2024)   Received from [facility]   Overall Financial Resource Strain (CARDIA)    Difficulty of Paying Living Expenses: Not hard at all  Food Insecurity: No Food Insecurity (02/23/2024)   Received from [facility]   Hunger Vital Sign    Worried About Running Out of Food in the Last Year: Never true    Ran Out of Food in the Last Year: Never true  Transportation Needs: No Transportation Needs (02/23/2024)   Received from [facility] - Transportation    Lack of Transportation (Medical): No    Lack of Transportation (Non-Medical): No   Physical Activity: Sufficiently Active (02/23/2024)   Received from [facility]   Exercise Vital Sign    Days of Exercise per Week: 4 days    Minutes of Exercise per Session: 120 min  Stress: No Stress Concern Present (02/23/2024)   Received from [facility] - Occupational Stress Questionnaire    Feeling of Stress : Not at all  Social Connections: Socially Integrated (02/23/2024)   Received from [facility]   Social Network    How would you rate your social network (family, work, friends)?: Good participation with social networks    Allergies:  Allergies  Allergen Reactions   Metrogel  [Metronidazole ] Other (See Comments)    Burn face   Tetracyclines & Related Rash    Metabolic Disorder Labs: Lab Results  Component Value Date   HGBA1C 5.5 09/04/2020   MPG 108 06/10/2015   No results found for: PROLACTIN Lab Results  Component Value Date   CHOL 205 (H) 12/25/2020   TRIG 76.0 12/25/2020   HDL 73.30 12/25/2020   CHOLHDL 3 12/25/2020   VLDL 15.2 12/25/2020   LDLCALC 116 (H) 12/25/2020   LDLCALC 122 (H) 09/04/2020   Lab Results  Component Value Date   TSH 0.15 (A) 10/04/2023   TSH 0.05 (L) 05/28/2021    Therapeutic Level Labs: Lab Results  Component Value Date   LITHIUM  1.2 11/08/2018   LITHIUM  0.80 06/10/2015   No results found for: VALPROATE No results found for: CBMZ  Current Medications: Current Outpatient Medications  Medication Sig Dispense Refill   Calcium Carbonate-Vitamin D  (CALTRATE 600+D PO) Take 2 tablets by mouth daily.     lamoTRIgine  (LAMICTAL ) 25 MG tablet Take 2 tablets (50 mg total) by mouth daily AND 1 tablet (25 mg total) every evening. 270 tablet 0   levothyroxine  (SYNTHROID ) 150 MCG tablet Take 1 tablet (150 mcg total) by mouth daily. 90 tablet 1   levothyroxine  (SYNTHROID ) 175 MCG tablet TAKE 1 TABLET BY MOUTH EVERY DAY (Patient not taking: Reported on 10/10/2023) 90 tablet 0   Multiple  Vitamin (MULTIVITAMIN WITH MINERALS) TABS tablet Take 1 tablet by mouth daily.     Omega-3 Fatty Acids (FISH OIL) 1000 MG CAPS Take 2,000 mg by mouth daily.     QUEtiapine  (SEROQUEL ) 100 MG tablet Take 1 tablet (100 mg total) by mouth at bedtime. Total of 175 mg at night. Take along with 50 mg and 25 mg tab  90 tablet 1   QUEtiapine  (SEROQUEL ) 25 MG tablet Take 1 tablet (25 mg total) by mouth at bedtime. Total of 175 mg at night. Take along with 100 mg, 50 mg tab 90 tablet 0   QUEtiapine  (SEROQUEL ) 50 MG tablet Take 1 tablet (50 mg total) by mouth at bedtime. Total of 175 mg at night. Take along with 100 mg and 50 mg tab 90 tablet 1   Vilazodone  HCl (VIIBRYD ) 10 MG TABS Take 1 tablet (10 mg total) by mouth daily. 90 tablet 0   Vitamin D , Ergocalciferol , (DRISDOL ) 1.25 MG (50000 UNIT) CAPS capsule Take 1 capsule (50,000 Units total) by mouth every 7 (seven) days. 12 capsule 0   No current facility-administered medications for this visit.     Musculoskeletal: Strength & Muscle Tone: N/A Gait & Station: N/A Patient leans: N/A  Psychiatric Specialty Exam: Review of Systems  There were no vitals taken for this visit.There is no height or weight on file to calculate BMI.  General Appearance: {Appearance:22683}  Eye Contact:  {B[facility] EYE CONTACT:22684}  Speech:  Clear and Coherent  Volume:  Normal  Mood:  {BHH MOOD:22306}  Affect:  {Affect (PAA):22687}  Thought Process:  Coherent  Orientation:  Full (Time, Place, and Person)  Thought Content: Logical   Suicidal Thoughts:  {ST/HT (PAA):22692}  Homicidal Thoughts:  {ST/HT (PAA):22692}  Memory:  Immediate;   Good  Judgement:  {Judgement (PAA):22694}  Insight:  {Insight (PAA):22695}  Psychomotor Activity:  Normal  Concentration:  Concentration: Good and Attention Span: Good  Recall:  Good  Fund of Knowledge: Good  Language: Good  Akathisia:  No  Handed:  Right  AIMS (if indicated): not done  Assets:  Communication Skills Desire for  Improvement  ADL's:  Intact  Cognition: WNL  Sleep:  {B[facility] GOOD/FAIR/POOR:22877}   Screenings: PHQ2-9    Flowsheet Row Video Visit from 10/12/2021 in [facility] Office Visit from 07/12/2021 in [facility] Video Visit from 02/11/2021 in [facility] Video Visit from 11/12/2020 in [facility] Office Visit from 12/13/2019 in [facility] [place]  PHQ-2 Total Score 0 4 0 0 0  PHQ-9 Total Score -- 11 -- -- 6   Flowsheet Row Video Visit from 10/12/2021 in [facility] Video Visit from 07/21/2021 in [facility] Office Visit from 07/12/2021 in [facility]  C-SSRS RISK CATEGORY No Risk Error: Q3, 4, or 5 should not be populated when Q2 is No High Risk     Assessment and Plan:  Mckinzi Eriksen is a 63 y.o. 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    1. Bipolar 1 disorder (HCC) 2. Insomnia, unspecified type Acute stressors include: declining in the condition of her dog,  Other stressors include:    History: admitted at age 60 due to mania, psychosis, depression, another episode at age 63 due to non adherence. Originally on lithium  300 mg QID, trifluoperazine  2 mg qhs, lexapro  20 mg daily, xanax  0.5 mg qhsprn      There has been worsening in depressive symptoms, exhaustion, irritability since the previous visit.  We uptitrate lamotrigine  to optimize treatment for bipolar depression.  Discussed potential risk of Stevens-Johnson syndrome.  Will continue  quetiapine  to target bipolar disorder, insomnia.  Will continue Viibryd  for  bipolar depression, off label.  She agrees to contact the office if any worsening in her mood symptoms.    # high risk medication use     Last checked  EKG HR 64, QTc 389 msec 10/2022  Lipid panels LDL 117  05/2023  HbA1c Glu 88 09/2023      Plan:   Increase lamotrigine  50  mg daily, 25 mg at night (previously reported dizziness from 100 mg, although she now attributes it to other medication ) - uptitrated 01/2024 Continue quetiapine  175 mg at night   Continue Vilazodone  10 mg daily  Next appointment: 6/24 at 1 30, video - vitamin D  was within limits 2023    Past trials of medication: Sertraline, fluoxetine  (drowsiness), Lexapro  (nausea, brain fog),  Duloxetine, venlafaxine  (initially worked well, stopped working, hair loss), bupropion (insomnia),  Paxil  (urinary retention), Latuda , Abilify (flu like symptoms), Haldol (EPS), Thorazine (EPS), Stelazine ,  Lithium , Lunesta (sedation), Ambien (sleepwalking, driving, eating), Trazodone  (excessive sedation), Xanax     The patient demonstrates the following  risk factors for suicide: Chronic risk factors for suicide include psychiatric disorder /bipolar disorder, history of substance use disorder, family history of suicide attempt. Acute risk factors for suicide include none. Protective factors for this patient include positive social support, responsibility to others (family), coping skills, hope for the future. The overall suicide risk at this point appears to be low, and she is appropriate for outpatient follow up.     Collaboration of Care: Collaboration of Care: {BH OP Collaboration of Care:21014065}  Patient/Guardian was advised Release of Information must be obtained prior to any record release in order to collaborate their care with an outside provider. Patient/Guardian was advised if they have not already done so to contact the registration department to sign all necessary forms in order for us  to release information regarding their care.    Consent: Patient/Guardian gives verbal consent for treatment and assignment of benefits for services provided during this visit. Patient/Guardian expressed understanding and agreed to proceed.    Todd Fossa, MD 03/14/2024, 9:14 AM

## 2024-03-19 ENCOUNTER — Encounter: Payer: Self-pay | Admitting: Psychiatry

## 2024-03-19 ENCOUNTER — Telehealth (INDEPENDENT_AMBULATORY_CARE_PROVIDER_SITE_OTHER): Admitting: Psychiatry

## 2024-03-19 DIAGNOSIS — G4700 Insomnia, unspecified: Secondary | ICD-10-CM

## 2024-03-19 DIAGNOSIS — F319 Bipolar disorder, unspecified: Secondary | ICD-10-CM

## 2024-03-19 MED ORDER — VILAZODONE HCL 10 MG PO TABS: 10.0000 mg | ORAL_TABLET | Freq: Every day | ORAL | 0 refills | Status: DC

## 2024-03-19 NOTE — Patient Instructions (Signed)
 Continue lamotrigine  25 mg three times a day  Continue quetiapine  175 mg at night   Continue Vilazodone  10 mg daily  Next appointment: 8/8 at 9 30

## 2024-04-06 ENCOUNTER — Other Ambulatory Visit: Payer: Self-pay | Admitting: Psychiatry

## 2024-04-10 ENCOUNTER — Telehealth (INDEPENDENT_AMBULATORY_CARE_PROVIDER_SITE_OTHER): Admitting: Psychiatry

## 2024-04-10 ENCOUNTER — Encounter: Payer: Self-pay | Admitting: Psychiatry

## 2024-04-10 ENCOUNTER — Telehealth: Payer: Self-pay

## 2024-04-10 DIAGNOSIS — F319 Bipolar disorder, unspecified: Secondary | ICD-10-CM

## 2024-04-10 DIAGNOSIS — G4700 Insomnia, unspecified: Secondary | ICD-10-CM | POA: Diagnosis not present

## 2024-04-10 MED ORDER — VORTIOXETINE HBR 10 MG PO TABS: 10.0000 mg | ORAL_TABLET | Freq: Every day | ORAL | 0 refills | Status: DC

## 2024-04-10 NOTE — Telephone Encounter (Signed)
went online to [email or web address] and submitted the PA- pending ?

## 2024-04-10 NOTE — Progress Notes (Signed)
 Virtual Visit via Video Note  I connected with Miranda Harris on 04/10/24 at  9:00 AM EDT by a video enabled telemedicine application and verified that I am speaking with the correct person using two identifiers.  Location: Patient: home Provider: home office Persons participated in the visit- patient, provider     I discussed the limitations of evaluation and management by telemedicine and the availability of in person appointments. The patient expressed understanding and agreed to proceed.   I discussed the assessment and treatment plan with the patient. The patient was provided an opportunity to ask questions and all were answered. The patient agreed with the plan and demonstrated an understanding of the instructions.   The patient was advised to call back or seek an in-person evaluation if the symptoms worsen or if the condition fails to improve as anticipated.   Miranda Sleet, MD      [facility] MD/PA/NP OP Progress Note  04/10/2024 9:33 AM Miranda Harris  MRN:  [id number]  Chief Complaint:  Chief Complaint  Patient presents with   Follow-up   HPI:  This is a follow-up appointment for bipolar 1 disorder and insomnia.  The appointment is made sooner due to patient concern.  She states that phenacetin is not working anymore.  Her thoughts are scattered.  She had difficulty making dinner, although she was told she is a good cook.  She tends to think about the worst case.  She feels depressed.  She had a few nights of not wanting to do this, feeling passive SI.  She denies any intent or plan.  She sleeps up to 8 hours.  She denies hallucinations.  She denies decreased need for sleep or euphoria.  She denies alcohol use or drug use.  She would like to get back on Trintellix  as she now has another insurance.  She agrees with the plans as outlined below.   Visit Diagnosis:    ICD-10-CM   1. Bipolar 1 disorder (HCC)  F31.9     2. Insomnia, unspecified type  G47.00       Past  Psychiatric History: Please see initial evaluation for full details. I have reviewed the history. No updates at this time.     Past Medical History:  Past Medical History:  Diagnosis Date   Bipolar disorder (HCC)    Blood transfusion without reported diagnosis    Chronic kidney disease    Depression    Elevated serum creatinine    related to lithium  use   Helicobacter pylori (H. pylori) infection    pt denies   Hypothyroidism    Perirectal fistula    Thyroid  disease    Trochanteric bursitis of right hip     Past Surgical History:  Procedure Laterality Date   COLONOSCOPY  01/06/2016   at [facility]   EVALUATION UNDER ANESTHESIA WITH HEMORRHOIDECTOMY N/A 06/28/2017   Procedure: EXAM UNDER ANESTHESIA WITH EXTERNAL HEMORRHOIDECTOMY AND REPAIR OF PERIRECTAL FISTULA;  Surgeon: Sheldon Standing, MD;  Location: [facility];  Service: General;  Laterality: N/A;   HERNIA REPAIR  8-10 yrs ago   umbilical hernia   INCISION AND DRAINAGE PERIRECTAL ABSCESS N/A 04/18/2017   Procedure: IRRIGATION AND DEBRIDEMENT PERIRECTAL ABSCESS, placement of seton, sigmoidoscopy;  Surgeon: Mikell Katz, MD;  Location: [facility];  Service: General;  Laterality: N/A;   LAPAROSCOPIC APPENDECTOMY     POLYPECTOMY     VAGINAL HYSTERECTOMY Bilateral    complete   WISDOM TOOTH EXTRACTION  Family Psychiatric History: Please see initial evaluation for full details. I have reviewed the history. No updates at this time.     Family History:  Family History  Problem Relation Age of Onset   Anxiety disorder Brother    Breast cancer Neg Hx    Colon cancer Neg Hx    Colon polyps Neg Hx    Esophageal cancer Neg Hx    Rectal cancer Neg Hx    Stomach cancer Neg Hx     Social History:  Social History   Socioeconomic History   Marital status: Married    Spouse name: Not on file   Number of children: Not on file   Years of education: Not on file   Highest education level: Not on file  Occupational  History   Not on file  Tobacco Use   Smoking status: Former    Current packs/day: 1.00    Average packs/day: 1 pack/day for 30.0 years (30.0 ttl pk-yrs)    Types: Cigarettes   Smokeless tobacco: Never   Tobacco comments:    quit 2008  Vaping Use   Vaping status: Never Used  Substance and Sexual Activity   Alcohol use: Yes    Comment: once a month per pt   Drug use: No   Sexual activity: Yes    Partners: Male    Birth control/protection: Surgical  Other Topics Concern   Not on file  Social History Narrative   Not on file   Social Drivers of Health   Financial Resource Strain: Low Risk  (02/23/2024)   Received from [facility]   Overall Financial Resource Strain (CARDIA)    Difficulty of Paying Living Expenses: Not hard at all  Food Insecurity: No Food Insecurity (02/23/2024)   Received from [facility]   Hunger Vital Sign    Within the past 12 months, you worried that your food would run out before you got the money to buy more.: Never true    Within the past 12 months, the food you bought just didn't last and you didn't have money to get more.: Never true  Transportation Needs: No Transportation Needs (02/23/2024)   Received from [facility] - Transportation    Lack of Transportation (Medical): No    Lack of Transportation (Non-Medical): No  Physical Activity: Sufficiently Active (02/23/2024)   Received from [facility]   Exercise Vital Sign    On average, how many days per week do you engage in moderate to strenuous exercise (like a brisk walk)?: 4 days    On average, how many minutes do you engage in exercise at this level?: 120 min  Stress: No Stress Concern Present (02/23/2024)   Received from [facility] - Occupational Stress Questionnaire    Feeling of Stress : Not at all  Social Connections: Socially Integrated (02/23/2024)   Received from [facility]   Social Network    How would you rate your  social network (family, work, friends)?: Good participation with social networks    Allergies:  Allergies  Allergen Reactions   Metrogel  [Metronidazole ] Other (See Comments)    Burn face   Tetracyclines & Related Rash    Metabolic Disorder Labs: Lab Results  Component Value Date   HGBA1C 5.5 09/04/2020   MPG 108 06/10/2015   No results found for: PROLACTIN Lab Results  Component Value Date   CHOL 205 (H) 12/25/2020   TRIG 76.0 12/25/2020  HDL 73.30 12/25/2020   CHOLHDL 3 12/25/2020   VLDL 15.2 12/25/2020   LDLCALC 116 (H) 12/25/2020   LDLCALC 122 (H) 09/04/2020   Lab Results  Component Value Date   TSH 0.15 (A) 10/04/2023   TSH 0.05 (L) 05/28/2021    Therapeutic Level Labs: Lab Results  Component Value Date   LITHIUM  1.2 11/08/2018   LITHIUM  0.80 06/10/2015   No results found for: VALPROATE No results found for: CBMZ  Current Medications: Current Outpatient Medications  Medication Sig Dispense Refill   vortioxetine  HBr (TRINTELLIX ) 10 MG TABS tablet Take 1 tablet (10 mg total) by mouth daily. 30 tablet 0   Calcium Carbonate-Vitamin D  (CALTRATE 600+D PO) Take 2 tablets by mouth daily.     celecoxib  (CELEBREX ) 200 MG capsule Take 200 mg by mouth 2 (two) times daily.     lamoTRIgine  (LAMICTAL ) 25 MG tablet Take 2 tablets (50 mg total) by mouth daily AND 1 tablet (25 mg total) every evening. 270 tablet 0   levothyroxine  (SYNTHROID ) 150 MCG tablet Take 1 tablet (150 mcg total) by mouth daily. 90 tablet 1   levothyroxine  (SYNTHROID ) 175 MCG tablet TAKE 1 TABLET BY MOUTH EVERY DAY (Patient not taking: Reported on 10/10/2023) 90 tablet 0   Multiple Vitamin (MULTIVITAMIN WITH MINERALS) TABS tablet Take 1 tablet by mouth daily.     Omega-3 Fatty Acids (FISH OIL) 1000 MG CAPS Take 2,000 mg by mouth daily.     QUEtiapine  (SEROQUEL ) 100 MG tablet Take 1 tablet (100 mg total) by mouth at bedtime. Total of 175 mg at night. Take along with 50 mg and 25 mg tab 90 tablet 1    QUEtiapine  (SEROQUEL ) 25 MG tablet Take 1 tablet (25 mg total) by mouth at bedtime. Total of 175 mg at night. Take along with 100 mg, 50 mg tab 90 tablet 0   QUEtiapine  (SEROQUEL ) 50 MG tablet Take 1 tablet (50 mg total) by mouth at bedtime. Total of 175 mg at night. Take along with 100 mg and 50 mg tab 90 tablet 1   [START ON 04/12/2024] Vilazodone  HCl (VIIBRYD ) 10 MG TABS Take 1 tablet (10 mg total) by mouth daily. 90 tablet 0   Vitamin D , Ergocalciferol , (DRISDOL ) 1.25 MG (50000 UNIT) CAPS capsule Take 1 capsule (50,000 Units total) by mouth every 7 (seven) days. 12 capsule 0   No current facility-administered medications for this visit.     Musculoskeletal: Strength & Muscle Tone: N/A Gait & Station: N/A Patient leans: N/A  Psychiatric Specialty Exam: Review of Systems  Psychiatric/Behavioral:  Positive for decreased concentration, dysphoric mood and suicidal ideas. Negative for agitation, behavioral problems, confusion, hallucinations, self-injury and sleep disturbance. The patient is not nervous/anxious and is not hyperactive.   All other systems reviewed and are negative.   There were no vitals taken for this visit.There is no height or weight on file to calculate BMI.  General Appearance: Well Groomed  Eye Contact:  Good  Speech:  Clear and Coherent  Volume:  Normal  Mood:  Depressed  Affect:  Appropriate, Congruent, Restricted, and down  Thought Process:  Coherent  Orientation:  Full (Time, Place, and Person)  Thought Content: Logical   Suicidal Thoughts:  No  Homicidal Thoughts:  No  Memory:  Immediate;   Good  Judgement:  Good  Insight:  Good  Psychomotor Activity:  Normal  Concentration:  Concentration: Good and Attention Span: Good  Recall:  Good  Fund of Knowledge: Good  Language: Good  Akathisia:  No  Handed:  Right  AIMS (if indicated): not done  Assets:  Communication Skills Desire for Improvement  ADL's:  Intact  Cognition: WNL  Sleep:  Good    Screenings: PHQ2-9    Flowsheet Row Video Visit from 10/12/2021 in [facility] Office Visit from 07/12/2021 in [facility] Video Visit from 02/11/2021 in [facility] Video Visit from 11/12/2020 in [facility] Office Visit from 12/13/2019 in [facility] [place]  PHQ-2 Total Score 0 4 0 0 0  PHQ-9 Total Score -- 11 -- -- 6   Flowsheet Row Video Visit from 10/12/2021 in [facility] Video Visit from 07/21/2021 in [facility] Office Visit from 07/12/2021 in [facility]  C-SSRS RISK CATEGORY No Risk Error: Q3, 4, or 5 should not be populated when Q2 is No High Risk     Assessment and Plan:  Dory Demont is a 63 y.o. 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   1. Bipolar 1 disorder (HCC) Acute stressors include: declining in the condition of her dog,  Other stressors include:    History: admitted at age 41 due to mania, psychosis, depression, another episode at age 80 due to non adherence. Originally on lithium  300 mg QID, trifluoperazine  2 mg qhs, lexapro  20 mg daily, xanax  0.5 mg qhsprn      There has been significant worsening in depressive symptoms since the last visit without any triggers.  She is now interested in switching back to Trintellix  now that she has another insurance.  Will make this switch to target bipolar depression, off label given it was effective in the past.  We will plan to try higher dose of feeling also done if she is unable to get this medication.  Discussed potential risk of medication-induced  mania.  Will continue lamotrigine  and quetiapine  to target bipolar depression. Although she may benefit from starting metformin, she prefers not to try this medication due to adverse reaction other people experienced.   2. Insomnia, unspecified type Stable.  Will continue current dose of quetiapine  to target insomnia, bipolar depression.     # high risk medication use     Last checked  EKG HR 64, QTc 389 msec 10/2022  Lipid panels LDL 120 H 01/2024  HbA1c Glu 71 01/2024      Plan:   Continue lamotrigine  25 mg three times a day- 50 mg caused drowsiness.  uptitrated 01/2024 Continue quetiapine  175 mg at night   Start Trintellix  10 mg daily  Hold viibryd  Continue Vilazodone  10 mg at night   Next appointment: 8/8 at 9 30, video - vitamin D  was within limits 2023    Past trials of medication: Sertraline, fluoxetine  (drowsiness), Lexapro  (nausea, brain fog),  Duloxetine, venlafaxine  (initially worked well, stopped working, hair loss), bupropion (insomnia),  Paxil  (urinary retention), Latuda , Abilify (flu like symptoms), Haldol (EPS), Thorazine (EPS), Stelazine ,  Lithium , Lunesta (sedation), Ambien (sleepwalking, driving, eating), Trazodone  (excessive sedation), Xanax     The patient demonstrates the following  risk factors for suicide: Chronic risk factors for suicide include psychiatric disorder /bipolar disorder, history of substance use disorder, family history of suicide attempt. Acute risk factors for suicide include none.  Protective factors for this patient include positive social support, responsibility to others (family), coping skills, hope for the future. The overall suicide risk at this point appears to be low, and she is appropriate for outpatient follow up.       Collaboration of Care: Collaboration of Care: Other reviewed notes in Epic  Patient/Guardian was advised Release of Information must be obtained prior to any record release in order to collaborate their care with an outside  provider. Patient/Guardian was advised if they have not already done so to contact the registration department to sign all necessary forms in order for us  to release information regarding their care.   Consent: Patient/Guardian gives verbal consent for treatment and assignment of benefits for services provided during this visit. Patient/Guardian expressed understanding and agreed to proceed.    Miranda Sleet, MD 04/10/2024, 9:33 AM

## 2024-04-10 NOTE — Telephone Encounter (Signed)
 received notice that the prior auth for thetrintellix was approved from 04-10-24 to 04-10-26

## 2024-04-10 NOTE — Telephone Encounter (Signed)
 received notice that a prior auth was needed for the trintellix 

## 2024-04-10 NOTE — Telephone Encounter (Signed)
 faxed and confirmed the Approval notice for medication trintellix 

## 2024-04-11 ENCOUNTER — Other Ambulatory Visit: Payer: Self-pay | Admitting: Psychiatry

## 2024-04-11 MED ORDER — VILAZODONE HCL 20 MG PO TABS: 20.0000 mg | ORAL_TABLET | Freq: Every day | ORAL | 0 refills | Status: DC

## 2024-04-11 MED ORDER — VORTIOXETINE HBR 20 MG PO TABS: 20.0000 mg | ORAL_TABLET | Freq: Every day | ORAL | 0 refills | Status: DC

## 2024-04-24 NOTE — Progress Notes (Signed)
 Pt presents today with possible sinus infection. Last weekend. Was seen at her primary care and states that the medication she was prescribed is not working for her. Symptoms. Pain behind eyes, face pressure, possible fluid in ear. Neck pain.

## 2024-04-24 NOTE — Progress Notes (Addendum)
 Subjective Patient ID: Miranda Harris is a 63 y.o. female.    Patient presents today with going on 2 weeks of maxillary sinus pressure, pressure behind the eyes, left ear pain and congestion, mild rhinitis.  On 723 patient was prescribed Augmentin  for acute sinusitis, patient just finished last dose yesterday and is still feeling bad.  Her primary care states that she needs something stronger prescribed but he is out of town for 3 weeks.  Patient admits to having a sinusitis last full but no recurrent condition that she knows of.  Patient states that she has been taking Benadryl every night to help with the fluid in the left ear that the doctor found at the last visit.  Left ear pain seems to be radiating down into the neck now.  Denies fevers, chills, cough, shortness of breath, chest tightness, rashes, neck stiffness or pain, visual changes, blurry vision, floaters in visual field.   Language interpreter used: No     Review of Systems  Constitutional:  Negative for chills and fever.  HENT:  Positive for congestion, ear pain, sinus pressure and sinus pain. Negative for ear discharge.   Respiratory:  Negative for cough, chest tightness and shortness of breath.   Gastrointestinal:  Negative for diarrhea and vomiting.  Musculoskeletal:  Negative for arthralgias and back pain.  Skin:  Negative for rash and wound.  Neurological:  Positive for headaches. Negative for dizziness, seizures, syncope, weakness, light-headedness and numbness.    Patient History  Allergies: Allergies  Allergen Reactions  . Metronidazole  Rash    Other Reaction(s): Other (See Comments)  Burn face    Past Medical History:  Diagnosis Date  . Disease of thyroid  gland   . Kidney disease    Past Surgical History:  Procedure Laterality Date  . ANAL FISTULOTOMY    . HERNIA REPAIR    . HYSTERECTOMY     Social History   Socioeconomic History  . Marital status: Married    Spouse name: Not on file  . Number of  children: Not on file  . Years of education: Not on file  . Highest education level: Not on file  Occupational History  . Not on file  Tobacco Use  . Smoking status: Never  . Smokeless tobacco: Never  Vaping Use  . Vaping status: Never Used  Substance and Sexual Activity  . Alcohol use: Not Currently  . Drug use: Never  . Sexual activity: Defer  Other Topics Concern  . Not on file  Social History Narrative  . Not on file   History reviewed. No pertinent family history. Current Outpatient Medications on File Prior to Visit  Medication Sig Dispense Refill  . celecoxib  (CeleBREX ) 200 MG capsule Take 200 mg by mouth in the morning and 200 mg in the evening.    . lamoTRIgine  (LaMICtal ) 25 MG tablet Take 50 mg by mouth 1 (one) time each day.    . levothyroxine  (Synthroid , Levoxyl ) 150 MCG tablet Take 1 tablet by mouth 1 (one) time each day.    . QUEtiapine  (SEROquel ) 100 MG tablet Take 150 mg by mouth 1 (one) time each day.    . QUEtiapine  (SEROquel ) 25 MG tablet Take 25 mg by mouth.    . QUEtiapine  (SEROquel ) 50 MG tablet Take 50 mg by mouth.    . acetaminophen  (Tylenol ) 500 MG tablet Take 500 mg by mouth.    . lamoTRIgine  (LaMICtal ) 25 MG tablet Take 50 mg by mouth.    . levothyroxine  (Synthroid ,  Levoxyl ) 150 MCG tablet Take 150 mcg by mouth.    . lithium  300 MG capsule Take 300 mg by mouth.     No current facility-administered medications on file prior to visit.    Objective  Vitals:   04/24/24 1302  BP: 118/86  BP Location: Right arm  Patient Position: Sitting  BP Cuff Size: Adult  Pulse: 73  Resp: 18  Temp: 36.8 C (98.3 F)  TempSrc: Oral  SpO2: 97%  Weight: 87.5 kg  Height: 5' 9  PainSc:   8        OBGYN/Pregnancy Status: Hysterectomy      No results found.  Physical Exam Constitutional:      Appearance: She is normal weight.  HENT:     Head: Normocephalic.     Right Ear: Tympanic membrane, ear canal and external ear normal.     Left Ear: Ear canal  and external ear normal. Tympanic membrane is bulging.     Nose: Congestion present.     Right Sinus: Maxillary sinus tenderness present. No frontal sinus tenderness.     Left Sinus: Maxillary sinus tenderness present. No frontal sinus tenderness.  Eyes:     Conjunctiva/sclera: Conjunctivae normal.  Cardiovascular:     Pulses: Normal pulses.  Pulmonary:     Effort: Pulmonary effort is normal. No respiratory distress.     Breath sounds: No stridor. No wheezing or rhonchi.  Neurological:     Mental Status: She is alert.     No results found for this visit on 04/24/24.     Procedures MDM:     1 Acute complicated illness or injury     Explanation of Medical Decision Making and variances from expected care:  Patient failed outpatient Augmentin  treatment, will treat with doxycycline .  I have discussed probiotic use for patient for recurrent antibiotic use patient understands and is on a probiotic already.  Patient will follow-up with primary care as soon as she is able.  Worsening symptoms, visual changes, weakness, facial droop, slurring words, aphasia and other stroke signs discussed with patient and need for higher level of care.   Assessment requiring historian other than patient: No     Independent visualization of image, tracing, or test: No     Discussion of management with another provider: No     Risk:: Moderate            Assessment/Plan Diagnoses and all orders for this visit:  Acute recurrent maxillary sinusitis -     doxycycline  (Adoxa) 100 MG tablet; Take 1 tablet (100 mg total) by mouth in the morning and 1 tablet (100 mg total) in the evening. Do all this for 7 days. Take with a full glass of water and do not lie down for at least 30 minutes after. -     cetirizine (ZyrTEC) 10 MG tablet; Take 1 tablet (10 mg total) by mouth 1 (one) time each day.        There are no Patient Instructions on file for this visit.  Progress note signed by Ludivina Evangelist, PA on  04/24/24 at  2:38 PM

## 2024-04-27 NOTE — Progress Notes (Unsigned)
 Virtual Visit via Video Note  I connected with Miranda Harris on 05/03/24 at  9:30 AM EDT by a video enabled telemedicine application and verified that I am speaking with the correct person using two identifiers.  Location: Patient: home Provider: home office Persons participated in the visit- patient, provider    I discussed the limitations of evaluation and management by telemedicine and the availability of in person appointments. The patient expressed understanding and agreed to proceed.      I discussed the assessment and treatment plan with the patient. The patient was provided an opportunity to ask questions and all were answered. The patient agreed with the plan and demonstrated an understanding of the instructions.   The patient was advised to call back or seek an in-person evaluation if the symptoms worsen or if the condition fails to improve as anticipated.    [name], MD    [facility] MD/PA/NP OP Progress Note  05/03/2024 9:57 AM Miranda Harris  MRN:  [id number]  Chief Complaint:  Chief Complaint  Patient presents with   Follow-up   HPI:  - she was treated for sinusisit since last visit This is a follow-up appointment for bipolar disorder, insomnia.  She states that she has been doing good.  It made a lot of difference, and she has been feeling better since uptitration of Viibryd .  She was able to get through things during the day.  She sees her neighbors.  She has been getting involved more in church community project.  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  She states that she is always social, and enjoys interaction with others.  She denies any irritability, and she feels lamotrigine  has been very helpful.  She sleeps well.  She denies feeling depressed.  She denies change in appetite.  She has been working on exercise.  She denies SI, HI, hallucinations.  She denies decreased need for sleep or  euphoria.  Although Viibryd  makes her feel drowsy after taking the medication, she denies concern about drowsiness in the morning.  She feels comfortable to stay on the current medication regimen.   Visit Diagnosis:    ICD-10-CM   1. Bipolar 1 disorder (HCC)  F31.9     2. Insomnia, unspecified type  G47.00       Past Psychiatric History: Please see initial evaluation for full details. I have reviewed the history. No updates at this time.     Past Medical History:  Past Medical History:  Diagnosis Date   Bipolar disorder (HCC)    Blood transfusion without reported diagnosis    Chronic kidney disease    Depression    Elevated serum creatinine    related to lithium  use   Helicobacter pylori (H. pylori) infection    pt denies   Hypothyroidism    Perirectal fistula    Thyroid  disease    Trochanteric bursitis of right hip     Past Surgical History:  Procedure Laterality Date   COLONOSCOPY  01/06/2016   at [facility]   EVALUATION UNDER ANESTHESIA WITH HEMORRHOIDECTOMY N/A 06/28/2017   Procedure: EXAM UNDER ANESTHESIA WITH EXTERNAL HEMORRHOIDECTOMY AND REPAIR OF PERIRECTAL FISTULA;  Surgeon: Sheldon Standing, MD;  Location: [facility];  Service: General;  Laterality: N/A;   HERNIA REPAIR  8-10 yrs ago   umbilical hernia   INCISION AND DRAINAGE PERIRECTAL ABSCESS N/A 04/18/2017   Procedure: IRRIGATION AND DEBRIDEMENT PERIRECTAL ABSCESS, placement of seton,  sigmoidoscopy;  Surgeon: Mikell Katz, MD;  Location: [facility];  Service: General;  Laterality: N/A;   LAPAROSCOPIC APPENDECTOMY     POLYPECTOMY     VAGINAL HYSTERECTOMY Bilateral    complete   WISDOM TOOTH EXTRACTION      Family Psychiatric History: Please see initial evaluation for full details. I have reviewed the history. No updates at this time.     Family History:  Family History  Problem Relation Age of Onset   Anxiety disorder Brother    Breast cancer Neg Hx    Colon cancer Neg Hx    Colon polyps Neg  Hx    Esophageal cancer Neg Hx    Rectal cancer Neg Hx    Stomach cancer Neg Hx     Social History:  Social History   Socioeconomic History   Marital status: Married    Spouse name: Not on file   Number of children: Not on file   Years of education: Not on file   Highest education level: Not on file  Occupational History   Not on file  Tobacco Use   Smoking status: Former    Current packs/day: 1.00    Average packs/day: 1 pack/day for 30.0 years (30.0 ttl pk-yrs)    Types: Cigarettes   Smokeless tobacco: Never   Tobacco comments:    quit 2008  Vaping Use   Vaping status: Never Used  Substance and Sexual Activity   Alcohol use: Yes    Comment: once a month per pt   Drug use: No   Sexual activity: Yes    Partners: Male    Birth control/protection: Surgical  Other Topics Concern   Not on file  Social History Narrative   Not on file   Social Drivers of Health   Financial Resource Strain: Low Risk  (02/23/2024)   Received from [facility]   Overall Financial Resource Strain (CARDIA)    Difficulty of Paying Living Expenses: Not hard at all  Food Insecurity: No Food Insecurity (02/23/2024)   Received from [facility]   Hunger Vital Sign    Within the past 12 months, you worried that your food would run out before you got the money to buy more.: Never true    Within the past 12 months, the food you bought just didn't last and you didn't have money to get more.: Never true  Transportation Needs: No Transportation Needs (02/23/2024)   Received from [facility] - Transportation    Lack of Transportation (Medical): No    Lack of Transportation (Non-Medical): No  Physical Activity: Sufficiently Active (02/23/2024)   Received from [facility]   Exercise Vital Sign    On average, how many days per week do you engage in moderate to strenuous exercise (like a brisk walk)?: 4 days    On average, how many minutes do you engage in exercise at this level?: 120 min   Stress: No Stress Concern Present (02/23/2024)   Received from [facility] - Occupational Stress Questionnaire    Feeling of Stress : Not at all  Social Connections: Socially Integrated (02/23/2024)   Received from [facility]   Social Network    How would you rate your social network (family, work, friends)?: Good participation with social networks    Allergies:  Allergies  Allergen Reactions   Metrogel  [Metronidazole ] Other (See Comments)    Burn face   Tetracyclines & Related Rash  Metabolic Disorder Labs: Lab Results  Component Value Date   HGBA1C 5.5 09/04/2020   MPG 108 06/10/2015   No results found for: PROLACTIN Lab Results  Component Value Date   CHOL 205 (H) 12/25/2020   TRIG 76.0 12/25/2020   HDL 73.30 12/25/2020   CHOLHDL 3 12/25/2020   VLDL 15.2 12/25/2020   LDLCALC 116 (H) 12/25/2020   LDLCALC 122 (H) 09/04/2020   Lab Results  Component Value Date   TSH 0.15 (A) 10/04/2023   TSH 0.05 (L) 05/28/2021    Therapeutic Level Labs: Lab Results  Component Value Date   LITHIUM  1.2 11/08/2018   LITHIUM  0.80 06/10/2015   No results found for: VALPROATE No results found for: CBMZ  Current Medications: Current Outpatient Medications  Medication Sig Dispense Refill   Calcium Carbonate-Vitamin D  (CALTRATE 600+D PO) Take 2 tablets by mouth daily.     celecoxib  (CELEBREX ) 200 MG capsule Take 200 mg by mouth 2 (two) times daily.     [START ON 05/27/2024] lamoTRIgine  (LAMICTAL ) 25 MG tablet Take 2 tablets (50 mg total) by mouth daily AND 1 tablet (25 mg total) every evening. 270 tablet 0   levothyroxine  (SYNTHROID ) 150 MCG tablet Take 1 tablet (150 mcg total) by mouth daily. 90 tablet 1   levothyroxine  (SYNTHROID ) 175 MCG tablet TAKE 1 TABLET BY MOUTH EVERY DAY (Patient not taking: Reported on 10/10/2023) 90 tablet 0   Multiple Vitamin (MULTIVITAMIN WITH MINERALS) TABS tablet Take 1 tablet by mouth daily.      Omega-3 Fatty Acids (FISH OIL) 1000 MG CAPS Take 2,000 mg by mouth daily.     QUEtiapine  (SEROQUEL ) 100 MG tablet Take 1 tablet (100 mg total) by mouth at bedtime. Total of 175 mg at night. Take along with 50 mg and 25 mg tab 90 tablet 1   QUEtiapine  (SEROQUEL ) 25 MG tablet Take 1 tablet (25 mg total) by mouth at bedtime. Total of 175 mg at night. Take along with 100 mg, 50 mg tab 90 tablet 0   QUEtiapine  (SEROQUEL ) 50 MG tablet Take 1 tablet (50 mg total) by mouth at bedtime. Total of 175 mg at night. Take along with 100 mg and 50 mg tab 90 tablet 1   Vilazodone  HCl (VIIBRYD ) 10 MG TABS Take 1 tablet (10 mg total) by mouth daily. 90 tablet 0   [START ON 05/11/2024] Vilazodone  HCl 20 MG TABS Take 1 tablet (20 mg total) by mouth daily. 90 tablet 0   Vitamin D , Ergocalciferol , (DRISDOL ) 1.25 MG (50000 UNIT) CAPS capsule Take 1 capsule (50,000 Units total) by mouth every 7 (seven) days. 12 capsule 0   No current facility-administered medications for this visit.     Musculoskeletal: Strength & Muscle Tone: N/A Gait & Station: N/A Patient leans: N/A  Psychiatric Specialty Exam: Review of Systems  Psychiatric/Behavioral:  Negative for agitation, behavioral problems, confusion, decreased concentration, dysphoric mood, hallucinations, self-injury, sleep disturbance and suicidal ideas. The patient is not nervous/anxious and is not hyperactive.   All other systems reviewed and are negative.   There were no vitals taken for this visit.There is no height or weight on file to calculate BMI.  General Appearance: Well Groomed  Eye Contact:  Good  Speech:  Clear and Coherent  Volume:  Normal  Mood:  better  Affect:  Appropriate, Congruent, and brighter  Thought Process:  Coherent  Orientation:  Full (Time, Place, and Person)  Thought Content: Logical   Suicidal Thoughts:  No  Homicidal Thoughts:  No  Memory:  Immediate;   Good  Judgement:  Good  Insight:  Good  Psychomotor Activity:  Normal   Concentration:  Concentration: Good and Attention Span: Good  Recall:  Good  Fund of Knowledge: Good  Language: Good  Akathisia:  No  Handed:  Right  AIMS (if indicated): not done  Assets:  Communication Skills Desire for Improvement  ADL's:  Intact  Cognition: WNL  Sleep:  Good   Screenings: PHQ2-9    Flowsheet Row Video Visit from 10/12/2021 in [facility] Office Visit from 07/12/2021 in [facility] Video Visit from 02/11/2021 in [facility] Video Visit from 11/12/2020 in [facility] Office Visit from 12/13/2019 in [facility]  PHQ-2 Total Score 0 4 0 0 0  PHQ-9 Total Score -- 11 -- -- 6   Flowsheet Row Video Visit from 10/12/2021 in [facility] Video Visit from 07/21/2021 in [facility] Office Visit from 07/12/2021 in [facility]  C-SSRS RISK CATEGORY No Risk Error: Q3, 4, or 5 should not be populated when Q2 is No High Risk     Assessment and Plan:  Miranda Harris is a 63 y.o. 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   1. Bipolar 1 disorder (HCC) History: admitted at age 61 due to mania, psychosis, depression, another episode at age 50 due to non adherence. Originally on lithium  300 mg QID, trifluoperazine  2 mg qhs, lexapro  20 mg daily, xanax  0.5 mg qhsprn      The exam is notable for brighter affect, and she reports significant improvement in depressive symptoms since uptitration of Viibryd .  Will continue current dose of quetiapine , lamotrigine  to target bipolar disorder, along with Viibryd ,  off label use for bipolar depression.  Noted although she may benefit from starting metformin, she prefers not to try this medication due to adverse reaction other people experienced.  2. Insomnia, unspecified type Stable.  Will continue current dose of quetiapine  to target insomnia, bipolar depression.    # high risk medication use     Last checked  EKG HR 64, QTc 389 msec 10/2022  Lipid panels LDL 120 H 01/2024  HbA1c Glu 71 01/2024      Plan:   Continue lamotrigine  25 mg three times a day- 50 mg caused drowsiness.  uptitrated 01/2024 Continue quetiapine  175 mg at night   Continue vilazodone  20 mg  Next appointment: 9/26 at 10 am, video - vitamin D  was within limits 2023    Past trials of medication: Sertraline, fluoxetine  (drowsiness), Lexapro  (nausea, brain fog),  Duloxetine, venlafaxine  (initially worked well, stopped working, hair loss), bupropion (insomnia),  Paxil  (urinary retention), Latuda , Abilify (flu like symptoms), Haldol (EPS), Thorazine (EPS), Stelazine ,  Lithium , Lunesta (sedation), Ambien (sleepwalking, driving, eating), Trazodone  (excessive sedation), Xanax     The patient demonstrates the following  risk factors for suicide: Chronic risk factors for suicide include psychiatric disorder /bipolar disorder, history of substance use disorder, family history of suicide attempt. Acute risk factors for suicide include none. Protective factors for this patient include positive social support, responsibility to others (family), coping skills, hope for the future. The overall suicide risk at this point appears to be low, and she is  appropriate for outpatient follow up.       Collaboration of Care: Collaboration of Care: Other reviewed notes in Epic  Patient/Guardian was advised Release of Information must be obtained prior to any record release in order to collaborate their care with an outside provider. Patient/Guardian was advised if they have not already done so to contact the  registration department to sign all necessary forms in order for us  to release information regarding their care.   Consent: Patient/Guardian gives verbal consent for treatment and assignment of benefits for services provided during this visit. Patient/Guardian expressed understanding and agreed to proceed.    [name], MD 05/03/2024, 9:57 AM

## 2024-05-03 ENCOUNTER — Encounter: Payer: Self-pay | Admitting: Psychiatry

## 2024-05-03 ENCOUNTER — Telehealth: Admitting: Psychiatry

## 2024-05-03 DIAGNOSIS — G4700 Insomnia, unspecified: Secondary | ICD-10-CM

## 2024-05-03 DIAGNOSIS — F319 Bipolar disorder, unspecified: Secondary | ICD-10-CM | POA: Diagnosis not present

## 2024-05-03 MED ORDER — VILAZODONE HCL 20 MG PO TABS: 20.0000 mg | ORAL_TABLET | Freq: Every day | ORAL | 0 refills | Status: DC

## 2024-05-03 MED ORDER — QUETIAPINE FUMARATE 25 MG PO TABS: 25.0000 mg | ORAL_TABLET | Freq: Every day | ORAL | 0 refills | Status: DC

## 2024-05-03 MED ORDER — LAMOTRIGINE 25 MG PO TABS: ORAL_TABLET | ORAL | 0 refills | Status: DC

## 2024-05-03 NOTE — Patient Instructions (Signed)
 Continue lamotrigine  25 mg three times a day Continue quetiapine  175 mg at night   Continue vilazodone  20 mg  Next appointment: 9/26 at 10 am, video

## 2024-05-17 ENCOUNTER — Ambulatory Visit
Admission: RE | Admit: 2024-05-17 | Discharge: 2024-05-17 | Disposition: A | Discharge status: Home / Self Care | Attending: Family Medicine | Admitting: Family Medicine

## 2024-05-17 VITALS — BP 167/99 | HR 71 | Temp 99.0°F | Resp 18 | Ht 68.0 in | Wt 189.0 lb

## 2024-05-17 DIAGNOSIS — F458 Other somatoform disorders: Secondary | ICD-10-CM | POA: Diagnosis not present

## 2024-05-17 MED ORDER — PREDNISONE 10 MG (21) PO TBPK: ORAL_TABLET | Freq: Every day | ORAL | 0 refills | Status: AC

## 2024-05-17 NOTE — ED Provider Notes (Signed)
 [name]    CSN: [id number] Arrival date & time: 05/17/24  1029      History   Chief Complaint Chief Complaint  Patient presents with   Facial Pain    Entered by patient    HPI Miranda Harris is a 63 y.o. female.   HPI Very pleasant 63 year old female presents with TMJ flareup.  Went to see her PCP yesterday and was prescribed prednisone ; however, her pharmacist advised that prednisone  dosage/taper was incorrect. PMH significant for CKD, depression, and bipolar disorder  Past Medical History:  Diagnosis Date   Bipolar disorder (HCC)    Blood transfusion without reported diagnosis    Chronic kidney disease    Depression    Elevated serum creatinine    related to lithium  use   Helicobacter pylori (H. pylori) infection    pt denies   Hypothyroidism    Perirectal fistula    Thyroid  disease    Trochanteric bursitis of right hip     Patient Active Problem List   Diagnosis Date Noted   Vitamin D  deficiency 09/04/2020   Left carpal tunnel syndrome 11/22/2018   CKD (chronic kidney disease) stage 3, GFR 30-59 ml/min (HCC) 11/09/2018   Daytime somnolence 06/20/2018   Intersphincteric fistula s/p LIFT repair 06/28/2017 06/28/2017   Bipolar I disorder, most recent episode depressed (HCC) 06/22/2016   Trochanteric bursitis of right hip 05/04/2016   Bursitis, trochanteric 04/19/2015   H/O bone density study 12/10/2014   History of Helicobacter pylori infection 12/10/2014   Elevated serum creatinine 08/26/2014   Bipolar 1 disorder (HCC) 06/25/2014   Arthralgia of hip 06/25/2014   Adult hypothyroidism 06/25/2014   Fatigue 06/25/2014    Past Surgical History:  Procedure Laterality Date   COLONOSCOPY  01/06/2016   at [facility]   EVALUATION UNDER ANESTHESIA WITH HEMORRHOIDECTOMY N/A 06/28/2017   Procedure: EXAM UNDER ANESTHESIA WITH EXTERNAL HEMORRHOIDECTOMY AND REPAIR OF PERIRECTAL FISTULA;  Surgeon: Sheldon Standing, MD;  Location: [facility];   Service: General;  Laterality: N/A;   HERNIA REPAIR  8-10 yrs ago   umbilical hernia   INCISION AND DRAINAGE PERIRECTAL ABSCESS N/A 04/18/2017   Procedure: IRRIGATION AND DEBRIDEMENT PERIRECTAL ABSCESS, placement of seton, sigmoidoscopy;  Surgeon: Mikell Katz, MD;  Location: [facility];  Service: General;  Laterality: N/A;   LAPAROSCOPIC APPENDECTOMY     POLYPECTOMY     VAGINAL HYSTERECTOMY Bilateral    complete   WISDOM TOOTH EXTRACTION      OB History   No obstetric history on file.      Home Medications    Prior to Admission medications   Medication Sig Start Date End Date Taking? Authorizing Provider  Calcium Carbonate-Vitamin D  (CALTRATE 600+D PO) Take 2 tablets by mouth daily.   Yes [provider]  celecoxib  (CELEBREX ) 200 MG capsule Take 200 mg by mouth 2 (two) times daily.   Yes [provider]  lamoTRIgine  (LAMICTAL ) 25 MG tablet Take 2 tablets (50 mg total) by mouth daily AND 1 tablet (25 mg total) every evening. 05/27/24 08/25/24 Yes Hisada, Katheren, MD  levothyroxine  (SYNTHROID ) 150 MCG tablet Take 1 tablet (150 mcg total) by mouth daily. 05/28/21  Yes Theophilus Andrews, Tully GRADE, MD  Multiple Vitamin (MULTIVITAMIN WITH MINERALS) TABS tablet Take 1 tablet by mouth daily.   Yes [provider]  Omega-3 Fatty Acids (FISH OIL) 1000 MG CAPS Take 2,000 mg by mouth daily.   Yes [provider]  predniSONE  (STERAPRED UNI-PAK 21 TAB) 10  MG (21) TBPK tablet Take by mouth daily. Take 6 tabs by mouth daily  for 2 days, then 5 tabs for 2 days, then 4 tabs for 2 days, then 3 tabs for 2 days, 2 tabs for 2 days, then 1 tab by mouth daily for 2 days 05/17/24  Yes Teddy Sharper, FNP  QUEtiapine  (SEROQUEL ) 100 MG tablet Take 1 tablet (100 mg total) by mouth at bedtime. Total of 175 mg at night. Take along with 50 mg and 25 mg tab 03/02/24 08/29/24 Yes Hisada, Katheren, MD  QUEtiapine  (SEROQUEL ) 25 MG tablet Take 1 tablet (25 mg total) by mouth at bedtime. Total of  175 mg at night. Take along with 100 mg, 50 mg tab 05/03/24 08/01/24 Yes Hisada, Katheren, MD  QUEtiapine  (SEROQUEL ) 50 MG tablet Take 1 tablet (50 mg total) by mouth at bedtime. Total of 175 mg at night. Take along with 100 mg and 50 mg tab 03/02/24 08/29/24 Yes Hisada, Katheren, MD  Vilazodone  HCl 20 MG TABS Take 1 tablet (20 mg total) by mouth daily. 05/11/24 08/09/24 Yes Hisada, Katheren, MD  Vitamin D , Ergocalciferol , (DRISDOL ) 1.25 MG (50000 UNIT) CAPS capsule Take 1 capsule (50,000 Units total) by mouth every 7 (seven) days. 06/03/21  Yes Theophilus Andrews, Tully GRADE, MD  levothyroxine  (SYNTHROID ) 175 MCG tablet TAKE 1 TABLET BY MOUTH EVERY DAY Patient not taking: Reported on 10/10/2023 08/18/21   Theophilus Andrews, Tully GRADE, MD  Vilazodone  HCl (VIIBRYD ) 10 MG TABS Take 1 tablet (10 mg total) by mouth daily. 04/12/24 07/11/24  Vickey Katheren, MD    Family History Family History  Problem Relation Age of Onset   Anxiety disorder Brother    Breast cancer Neg Hx    Colon cancer Neg Hx    Colon polyps Neg Hx    Esophageal cancer Neg Hx    Rectal cancer Neg Hx    Stomach cancer Neg Hx     Social History Social History   Tobacco Use   Smoking status: Former    Current packs/day: 1.00    Average packs/day: 1 pack/day for 30.0 years (30.0 ttl pk-yrs)    Types: Cigarettes   Smokeless tobacco: Never   Tobacco comments:    quit 2008  Vaping Use   Vaping status: Never Used  Substance Use Topics   Alcohol use: Yes    Comment: once a month per pt   Drug use: No     Allergies   Metrogel  [metronidazole ] and Tetracyclines & related   Review of Systems Review of Systems  HENT:         TMJ from teeth grinding     Physical Exam Triage Vital Signs ED Triage Vitals  Encounter Vitals Group     BP 05/17/24 1057 (!) 167/99     Girls Systolic BP Percentile --      Girls Diastolic BP Percentile --      Boys Systolic BP Percentile --      Boys Diastolic BP Percentile --      Pulse Rate 05/17/24 1057  71     Resp 05/17/24 1057 18     Temp 05/17/24 1057 99 F (37.2 C)     Temp Source 05/17/24 1057 Oral     SpO2 05/17/24 1057 98 %     Weight 05/17/24 1059 189 lb (85.7 kg)     Height 05/17/24 1059 5' 8 (1.727 m)     Head Circumference --      Peak Flow --  Pain Score 05/17/24 1059 8     Pain Loc --      Pain Education --      Exclude from Growth Chart --    No data found.  Updated Vital Signs BP (!) 167/99 (BP Location: Right Arm)   Pulse 71   Temp 99 F (37.2 C) (Oral)   Resp 18   Ht 5' 8 (1.727 m)   Wt 189 lb (85.7 kg)   SpO2 98%   BMI 28.74 kg/m    Physical Exam Vitals and nursing note reviewed.  Constitutional:      Appearance: Normal appearance. She is normal weight.  HENT:     Head: Normocephalic and atraumatic.     Right Ear: Tympanic membrane, ear canal and external ear normal.     Left Ear: Tympanic membrane, ear canal and external ear normal.     Mouth/Throat:     Mouth: Mucous membranes are moist.     Pharynx: Oropharynx is clear.  Eyes:     Extraocular Movements: Extraocular movements intact.     Pupils: Pupils are equal, round, and reactive to light.  Cardiovascular:     Rate and Rhythm: Normal rate and regular rhythm.     Pulses: Normal pulses.     Heart sounds: Normal heart sounds.  Pulmonary:     Effort: Pulmonary effort is normal.     Breath sounds: Normal breath sounds. No wheezing, rhonchi or rales.  Musculoskeletal:        General: Normal range of motion.  Skin:    General: Skin is warm and dry.  Neurological:     General: No focal deficit present.     Mental Status: She is alert and oriented to person, place, and time. Mental status is at baseline.  Psychiatric:        Mood and Affect: Mood normal.        Behavior: Behavior normal.      UC Treatments / Results  Labs (all labs ordered are listed, but only abnormal results are displayed) Labs Reviewed - No data to display  EKG   Radiology No results  found.  Procedures Procedures (including critical care time)  Medications Ordered in UC Medications - No data to display  Initial Impression / Assessment and Plan / UC Course  I have reviewed the triage vital signs and the nursing notes.  Pertinent labs & imaging results that were available during my care of the patient were reviewed by me and considered in my medical decision making (see chart for details).     MDM: 1.  Bruxism-Rx'd Sterapred Unipak (42 tab 10 mg taper). Advised patient take medication as directed with food to completion.  Encouraged to increase daily water intake to 64 ounces per day while taking this medication.  Advised if symptoms worsen and/or unresolved please follow-up with your PCP or here for further evaluation.  Patient discharged home, hemodynamically stable. Final Clinical Impressions(s) / UC Diagnoses   Final diagnoses:  Bruxism     Discharge Instructions      Advised patient take medication as directed with food to completion.  Encouraged to increase daily water intake to 64 ounces per day while taking this medication.  Advised if symptoms worsen and/or unresolved please follow-up with your PCP or here for further evaluation.     ED Prescriptions     Medication Sig Dispense Auth. Provider   predniSONE  (STERAPRED UNI-PAK 21 TAB) 10 MG (21) TBPK tablet Take by mouth daily. Take  6 tabs by mouth daily  for 2 days, then 5 tabs for 2 days, then 4 tabs for 2 days, then 3 tabs for 2 days, 2 tabs for 2 days, then 1 tab by mouth daily for 2 days 42 tablet Teddy Sharper, FNP      PDMP not reviewed this encounter.   Teddy Sharper, FNP 05/17/24 1225

## 2024-05-17 NOTE — ED Triage Notes (Signed)
 Patient states that she has a history of TMJ, flare up within the past week.  Went to see her PCP yesterday and prescribed prednisone .  The way it was prescribed, pharmacists advised patient that it was incorrect.  Facial pain.

## 2024-05-17 NOTE — Discharge Instructions (Addendum)
 Advised patient take medication as directed with food to completion.  Encouraged to increase daily water intake to 64 ounces per day while taking this medication.  Advised if symptoms worsen and/or unresolved please follow-up with your PCP or here for further evaluation.

## 2024-06-06 ENCOUNTER — Ambulatory Visit
Admission: RE | Admit: 2024-06-06 | Discharge: 2024-06-06 | Disposition: A | Discharge status: Home / Self Care | Source: Ambulatory Visit | Attending: Cardiovascular Disease | Admitting: Cardiovascular Disease

## 2024-06-06 VITALS — BP 151/91 | HR 83 | Temp 98.2°F | Resp 16

## 2024-06-06 DIAGNOSIS — H938X3 Other specified disorders of ear, bilateral: Secondary | ICD-10-CM

## 2024-06-06 NOTE — Discharge Instructions (Addendum)
 Reassured/advised patient bilateral ears were clear and not infected this morning.  Advised ears were clear and free of earwax as well.  Advised if symptoms worsen and/or unresolved please follow-up with your PCP or ENT.  Contact information provided with this AVS today.

## 2024-06-06 NOTE — ED Triage Notes (Signed)
 Bil ear pressure and feeling blocked since before last visit on 8/22. Reports ears are getting bigger. No otc meds.

## 2024-06-06 NOTE — ED Provider Notes (Signed)
 [facility]    CSN: [id number] Arrival date & time: 06/06/24  0951      History   Chief Complaint Chief Complaint  Patient presents with   Ear Fullness    Entered by patient    HPI Miranda Harris is a 63 y.o. female.   HPI 63 year old female presents with bilateral ear fullness.  PMH significant for CKD and bipolar disorder.  Past Medical History:  Diagnosis Date   Bipolar disorder (HCC)    Blood transfusion without reported diagnosis    Chronic kidney disease    Depression    Elevated serum creatinine    related to lithium  use   Helicobacter pylori (H. pylori) infection    pt denies   Hypothyroidism    Perirectal fistula    Thyroid  disease    Trochanteric bursitis of right hip     Patient Active Problem List   Diagnosis Date Noted   Vitamin D  deficiency 09/04/2020   Left carpal tunnel syndrome 11/22/2018   CKD (chronic kidney disease) stage 3, GFR 30-59 ml/min (HCC) 11/09/2018   Daytime somnolence 06/20/2018   Intersphincteric fistula s/p LIFT repair 06/28/2017 06/28/2017   Bipolar I disorder, most recent episode depressed (HCC) 06/22/2016   Trochanteric bursitis of right hip 05/04/2016   Bursitis, trochanteric 04/19/2015   H/O bone density study 12/10/2014   History of Helicobacter pylori infection 12/10/2014   Elevated serum creatinine 08/26/2014   Bipolar 1 disorder (HCC) 06/25/2014   Arthralgia of hip 06/25/2014   Adult hypothyroidism 06/25/2014   Fatigue 06/25/2014    Past Surgical History:  Procedure Laterality Date   COLONOSCOPY  01/06/2016   at [facility]   EVALUATION UNDER ANESTHESIA WITH HEMORRHOIDECTOMY N/A 06/28/2017   Procedure: EXAM UNDER ANESTHESIA WITH EXTERNAL HEMORRHOIDECTOMY AND REPAIR OF PERIRECTAL FISTULA;  Surgeon: Sheldon Standing, MD;  Location: [facility] [place];  Service: General;  Laterality: N/A;   HERNIA REPAIR  8-10 yrs ago   umbilical hernia   INCISION AND DRAINAGE PERIRECTAL ABSCESS N/A 04/18/2017    Procedure: IRRIGATION AND DEBRIDEMENT PERIRECTAL ABSCESS, placement of seton, sigmoidoscopy;  Surgeon: Mikell Katz, MD;  Location: [facility];  Service: General;  Laterality: N/A;   LAPAROSCOPIC APPENDECTOMY     POLYPECTOMY     VAGINAL HYSTERECTOMY Bilateral    complete   WISDOM TOOTH EXTRACTION      OB History   No obstetric history on file.      Home Medications    Prior to Admission medications   Medication Sig Start Date End Date Taking? Authorizing Provider  Calcium Carbonate-Vitamin D  (CALTRATE 600+D PO) Take 2 tablets by mouth daily.    [provider]  celecoxib  (CELEBREX ) 200 MG capsule Take 200 mg by mouth 2 (two) times daily.    [provider]  lamoTRIgine  (LAMICTAL ) 25 MG tablet Take 2 tablets (50 mg total) by mouth daily AND 1 tablet (25 mg total) every evening. 05/27/24 08/25/24  Vickey Mettle, MD  levothyroxine  (SYNTHROID ) 150 MCG tablet Take 1 tablet (150 mcg total) by mouth daily. 05/28/21   Theophilus Andrews, Tully GRADE, MD  levothyroxine  (SYNTHROID ) 175 MCG tablet TAKE 1 TABLET BY MOUTH EVERY DAY Patient not taking: Reported on 10/10/2023 08/18/21   Theophilus Andrews, Tully GRADE, MD  Multiple Vitamin (MULTIVITAMIN WITH MINERALS) TABS tablet Take 1 tablet by mouth daily.    [provider]  Omega-3 Fatty Acids (FISH OIL) 1000 MG CAPS Take 2,000 mg by mouth daily.    [provider]  predniSONE  (STERAPRED UNI-PAK 21 TAB) 10 MG (21) TBPK tablet Take by mouth daily. Take 6 tabs by mouth daily  for 2 days, then 5 tabs for 2 days, then 4 tabs for 2 days, then 3 tabs for 2 days, 2 tabs for 2 days, then 1 tab by mouth daily for 2 days 05/17/24   Teddy Sharper, FNP  QUEtiapine  (SEROQUEL ) 100 MG tablet Take 1 tablet (100 mg total) by mouth at bedtime. Total of 175 mg at night. Take along with 50 mg and 25 mg tab 03/02/24 08/29/24  Vickey Mettle, MD  QUEtiapine  (SEROQUEL ) 25 MG tablet Take 1 tablet (25 mg total) by mouth at bedtime. Total of 175 mg at  night. Take along with 100 mg, 50 mg tab 05/03/24 08/01/24  Vickey Mettle, MD  QUEtiapine  (SEROQUEL ) 50 MG tablet Take 1 tablet (50 mg total) by mouth at bedtime. Total of 175 mg at night. Take along with 100 mg and 50 mg tab 03/02/24 08/29/24  Vickey Mettle, MD  Vilazodone  HCl (VIIBRYD ) 10 MG TABS Take 1 tablet (10 mg total) by mouth daily. 04/12/24 07/11/24  Vickey Mettle, MD  Vilazodone  HCl 20 MG TABS Take 1 tablet (20 mg total) by mouth daily. 05/11/24 08/09/24  Vickey Mettle, MD  Vitamin D , Ergocalciferol , (DRISDOL ) 1.25 MG (50000 UNIT) CAPS capsule Take 1 capsule (50,000 Units total) by mouth every 7 (seven) days. 06/03/21   Theophilus Andrews, Tully GRADE, MD    Family History Family History  Problem Relation Age of Onset   Anxiety disorder Brother    Breast cancer Neg Hx    Colon cancer Neg Hx    Colon polyps Neg Hx    Esophageal cancer Neg Hx    Rectal cancer Neg Hx    Stomach cancer Neg Hx     Social History Social History   Tobacco Use   Smoking status: Former    Current packs/day: 1.00    Average packs/day: 1 pack/day for 30.0 years (30.0 ttl pk-yrs)    Types: Cigarettes   Smokeless tobacco: Never   Tobacco comments:    quit 2008  Vaping Use   Vaping status: Never Used  Substance Use Topics   Alcohol use: Yes    Comment: once a month per pt   Drug use: No     Allergies   Metrogel  [metronidazole ] and Tetracyclines & related   Review of Systems Review of Systems  HENT:         Bilateral ear fullness for 1 week  All other systems reviewed and are negative.    Physical Exam Triage Vital Signs ED Triage Vitals  Encounter Vitals Group     BP      Girls Systolic BP Percentile      Girls Diastolic BP Percentile      Boys Systolic BP Percentile      Boys Diastolic BP Percentile      Pulse      Resp      Temp      Temp src      SpO2      Weight      Height      Head Circumference      Peak Flow      Pain Score      Pain Loc      Pain Education      Exclude  from Growth Chart    No data found.  Updated Vital Signs BP (!) 151/91   Pulse 83  Temp 98.2 F (36.8 C)   Resp 16   SpO2 98%   Physical Exam Vitals and nursing note reviewed.  Constitutional:      General: She is not in acute distress.    Appearance: Normal appearance. She is normal weight. She is not ill-appearing.  HENT:     Head: Normocephalic and atraumatic.     Right Ear: Tympanic membrane, ear canal and external ear normal.     Left Ear: Tympanic membrane, ear canal and external ear normal.     Mouth/Throat:     Mouth: Mucous membranes are moist.     Pharynx: Oropharynx is clear.  Eyes:     Extraocular Movements: Extraocular movements intact.     Pupils: Pupils are equal, round, and reactive to light.  Cardiovascular:     Rate and Rhythm: Normal rate and regular rhythm.     Pulses: Normal pulses.     Heart sounds: Normal heart sounds.  Pulmonary:     Effort: Pulmonary effort is normal.     Breath sounds: Normal breath sounds. No wheezing, rhonchi or rales.  Musculoskeletal:        General: Normal range of motion.  Skin:    General: Skin is warm and dry.  Neurological:     General: No focal deficit present.     Mental Status: She is alert and oriented to person, place, and time. Mental status is at baseline.  Psychiatric:        Mood and Affect: Mood normal.        Behavior: Behavior normal.      UC Treatments / Results  Labs (all labs ordered are listed, but only abnormal results are displayed) Labs Reviewed - No data to display  EKG   Radiology No results found.  Procedures Procedures (including critical care time)  Medications Ordered in UC Medications - No data to display  Initial Impression / Assessment and Plan / UC Course  I have reviewed the triage vital signs and the nursing notes.  Pertinent labs & imaging results that were available during my care of the patient were reviewed by me and considered in my medical decision making (see  chart for details).     MDM: 1.  Ear fullness, bilateral-Reassured/advised patient bilateral ears were clear and not infected this morning.  Advised ears were clear and free of earwax as well.  Advised if symptoms worsen and/or unresolved please follow-up with your PCP or ENT.  Contact information provided with this AVS today.  Patient discharged home, hemodynamically stable. Final Clinical Impressions(s) / UC Diagnoses   Final diagnoses:  Ear fullness, bilateral     Discharge Instructions      Reassured/advised patient bilateral ears were clear and not infected this morning.  Advised ears were clear and free of earwax as well.  Advised if symptoms worsen and/or unresolved please follow-up with your PCP or ENT.  Contact information provided with this AVS today.     ED Prescriptions   None    PDMP not reviewed this encounter.   Teddy Sharper, FNP 06/06/24 1115

## 2024-06-18 NOTE — Progress Notes (Unsigned)
 Virtual Visit via Video Note  I connected with Miranda Harris on 06/21/24 at 10:00 AM EDT by a video enabled telemedicine application and verified that I am speaking with the correct person using two identifiers.  Location: Patient: home Provider: home office Persons participated in the visit- patient, provider    I discussed the limitations of evaluation and management by telemedicine and the availability of in person appointments. The patient expressed understanding and agreed to proceed.   I discussed the assessment and treatment plan with the patient. The patient was provided an opportunity to ask questions and all were answered. The patient agreed with the plan and demonstrated an understanding of the instructions.   The patient was advised to call back or seek an in-person evaluation if the symptoms worsen or if the condition fails to improve as anticipated.   [name], MD    [facility] MD/PA/NP OP Progress Note  06/21/2024 10:24 AM Miranda Harris  MRN:  [id number]  Chief Complaint:  Chief Complaint  Patient presents with   Follow-up   HPI:  This is a follow-up appointment for bipolar 1 disorder, insomnia. She states that there has been no change.  She keeps herself busy.  She is working on the front and is hoping to make beds for the plants.  She has been more involved in the church activities.  She is in charge of making chili.  Although she may have some stress, she does not feel well overwhelmed or scared anymore.  She denies feeling depressed.  She has been able to sleep several hours.  She finds lamotrigine  to be very helpful for irritability and she does not feel that way later in the day anymore.  She has good appetite, and feels good about the recent weight loss.  She denies SI, HI, hallucinations.  She denies decreased need for sleep or euphoria.  She feels comfortable to stay on the current medication regimen.   186 lbs Wt Readings from Last 3 Encounters:   05/17/24 189 lb (85.7 kg)  07/12/21 192 lb 12.8 oz (87.5 kg)  06/10/21 186 lb 14.4 oz (84.8 kg)     Visit Diagnosis:    ICD-10-CM   1. Bipolar 1 disorder (HCC)  F31.9     2. Insomnia, unspecified type  G47.00       Past Psychiatric History: Please see initial evaluation for full details. I have reviewed the history. No updates at this time.     Past Medical History:  Past Medical History:  Diagnosis Date   Bipolar disorder (HCC)    Blood transfusion without reported diagnosis    Chronic kidney disease    Depression    Elevated serum creatinine    related to lithium  use   Helicobacter pylori (H. pylori) infection    pt denies   Hypothyroidism    Perirectal fistula    Thyroid  disease    Trochanteric bursitis of right hip     Past Surgical History:  Procedure Laterality Date   COLONOSCOPY  01/06/2016   at [facility]   EVALUATION UNDER ANESTHESIA WITH HEMORRHOIDECTOMY N/A 06/28/2017   Procedure: EXAM UNDER ANESTHESIA WITH EXTERNAL HEMORRHOIDECTOMY AND REPAIR OF PERIRECTAL FISTULA;  Surgeon: Sheldon Standing, MD;  Location: [facility] [place];  Service: General;  Laterality: N/A;   HERNIA REPAIR  8-10 yrs ago   umbilical hernia   INCISION AND DRAINAGE PERIRECTAL ABSCESS N/A 04/18/2017   Procedure: IRRIGATION AND DEBRIDEMENT PERIRECTAL ABSCESS, placement of seton, sigmoidoscopy;  Surgeon: Mikell,  Morene, MD;  Location: [facility];  Service: General;  Laterality: N/A;   LAPAROSCOPIC APPENDECTOMY     POLYPECTOMY     VAGINAL HYSTERECTOMY Bilateral    complete   WISDOM TOOTH EXTRACTION      Family Psychiatric History: Please see initial evaluation for full details. I have reviewed the history. No updates at this time.     Family History:  Family History  Problem Relation Age of Onset   Anxiety disorder Brother    Breast cancer Neg Hx    Colon cancer Neg Hx    Colon polyps Neg Hx    Esophageal cancer Neg Hx    Rectal cancer Neg Hx    Stomach cancer Neg Hx      Social History:  Social History   Socioeconomic History   Marital status: Married    Spouse name: Not on file   Number of children: Not on file   Years of education: Not on file   Highest education level: Not on file  Occupational History   Not on file  Tobacco Use   Smoking status: Former    Current packs/day: 1.00    Average packs/day: 1 pack/day for 30.0 years (30.0 ttl pk-yrs)    Types: Cigarettes   Smokeless tobacco: Never   Tobacco comments:    quit 2008  Vaping Use   Vaping status: Never Used  Substance and Sexual Activity   Alcohol use: Yes    Comment: once a month per pt   Drug use: No   Sexual activity: Yes    Partners: Male    Birth control/protection: Surgical  Other Topics Concern   Not on file  Social History Narrative   Not on file   Social Drivers of Health   Financial Resource Strain: Low Risk  (05/14/2024)   Received from [facility]   Overall Financial Resource Strain (CARDIA)    How hard is it for you to pay for the very basics like food, housing, medical care, and heating?: Not hard at all  Food Insecurity: No Food Insecurity (05/14/2024)   Received from [facility]   Hunger Vital Sign    Within the past 12 months, you worried that your food would run out before you got the money to buy more.: Never true    Within the past 12 months, the food you bought just didn't last and you didn't have money to get more.: Never true  Transportation Needs: No Transportation Needs (05/14/2024)   Received from [facility] - Transportation    In the past 12 months, has lack of transportation kept you from medical appointments or from getting medications?: No    In the past 12 months, has lack of transportation kept you from meetings, work, or from getting things needed for daily living?: No  Physical Activity: Insufficiently Active (05/14/2024)   Received from [facility]   Exercise Vital Sign    On average, how many days per week do you  engage in moderate to strenuous exercise (like a brisk walk)?: 4 days    On average, how many minutes do you engage in exercise at this level?: 30 min  Stress: Stress Concern Present (05/14/2024)   Received from [facility] - Occupational Stress Questionnaire    Do you feel stress - tense, restless, nervous, or anxious, or unable to sleep at night because your mind is troubled all the time - these days?: To some  extent  Social Connections: Socially Integrated (05/14/2024)   Received from [facility]   Social Network    How would you rate your social network (family, work, friends)?: Good participation with social networks    Allergies:  Allergies  Allergen Reactions   Metrogel  [Metronidazole ] Other (See Comments)    Burn face   Tetracyclines & Related Rash    Metabolic Disorder Labs: Lab Results  Component Value Date   HGBA1C 5.5 09/04/2020   MPG 108 06/10/2015   No results found for: PROLACTIN Lab Results  Component Value Date   CHOL 205 (H) 12/25/2020   TRIG 76.0 12/25/2020   HDL 73.30 12/25/2020   CHOLHDL 3 12/25/2020   VLDL 15.2 12/25/2020   LDLCALC 116 (H) 12/25/2020   LDLCALC 122 (H) 09/04/2020   Lab Results  Component Value Date   TSH 0.15 (A) 10/04/2023   TSH 0.05 (L) 05/28/2021    Therapeutic Level Labs: Lab Results  Component Value Date   LITHIUM  1.2 11/08/2018   LITHIUM  0.80 06/10/2015   No results found for: VALPROATE No results found for: CBMZ  Current Medications: Current Outpatient Medications  Medication Sig Dispense Refill   Calcium Carbonate-Vitamin D  (CALTRATE 600+D PO) Take 2 tablets by mouth daily.     celecoxib  (CELEBREX ) 200 MG capsule Take 200 mg by mouth 2 (two) times daily.     [START ON 08/25/2024] lamoTRIgine  (LAMICTAL ) 25 MG tablet Take 2 tablets (50 mg total) by mouth in the morning, at noon, and at bedtime. 540 tablet 0   levothyroxine  (SYNTHROID ) 150 MCG tablet Take 1 tablet  (150 mcg total) by mouth daily. 90 tablet 1   levothyroxine  (SYNTHROID ) 175 MCG tablet TAKE 1 TABLET BY MOUTH EVERY DAY (Patient not taking: Reported on 10/10/2023) 90 tablet 0   Multiple Vitamin (MULTIVITAMIN WITH MINERALS) TABS tablet Take 1 tablet by mouth daily.     Omega-3 Fatty Acids (FISH OIL) 1000 MG CAPS Take 2,000 mg by mouth daily.     predniSONE  (STERAPRED UNI-PAK 21 TAB) 10 MG (21) TBPK tablet Take by mouth daily. Take 6 tabs by mouth daily  for 2 days, then 5 tabs for 2 days, then 4 tabs for 2 days, then 3 tabs for 2 days, 2 tabs for 2 days, then 1 tab by mouth daily for 2 days 42 tablet 0   QUEtiapine  (SEROQUEL ) 100 MG tablet Take 1 tablet (100 mg total) by mouth at bedtime. Total of 175 mg at night. Take along with 50 mg and 25 mg tab 90 tablet 1   [START ON 08/01/2024] QUEtiapine  (SEROQUEL ) 25 MG tablet Take 1 tablet (25 mg total) by mouth at bedtime. Total of 175 mg at night. Take along with 100 mg, 50 mg tab 90 tablet 1   QUEtiapine  (SEROQUEL ) 50 MG tablet Take 1 tablet (50 mg total) by mouth at bedtime. Total of 175 mg at night. Take along with 100 mg and 50 mg tab 90 tablet 1   [START ON 08/09/2024] Vilazodone  HCl 20 MG TABS Take 1 tablet (20 mg total) by mouth daily. 90 tablet 0   Vitamin D , Ergocalciferol , (DRISDOL ) 1.25 MG (50000 UNIT) CAPS capsule Take 1 capsule (50,000 Units total) by mouth every 7 (seven) days. 12 capsule 0   No current facility-administered medications for this visit.     Musculoskeletal: Strength & Muscle Tone: N/A Gait & Station: N/A Patient leans: N/A  Psychiatric Specialty Exam: Review of Systems  Psychiatric/Behavioral:  Negative for agitation, behavioral problems, confusion,  decreased concentration, dysphoric mood, hallucinations, self-injury, sleep disturbance and suicidal ideas. The patient is not nervous/anxious and is not hyperactive.   All other systems reviewed and are negative.   There were no vitals taken for this visit.There is no  height or weight on file to calculate BMI.  General Appearance: Well Groomed  Eye Contact:  Good  Speech:  Clear and Coherent  Volume:  Normal  Mood:  good  Affect:  Appropriate, Congruent, and Full Range  Thought Process:  Coherent  Orientation:  Full (Time, Place, and Person)  Thought Content: Logical   Suicidal Thoughts:  No  Homicidal Thoughts:  No  Memory:  Immediate;   Good  Judgement:  Good  Insight:  Good  Psychomotor Activity:  Normal  Concentration:  Concentration: Good and Attention Span: Good  Recall:  Good  Fund of Knowledge: Good  Language: Good  Akathisia:  No  Handed:  Right  AIMS (if indicated): not done  Assets:  Communication Skills  ADL's:  Intact  Cognition: WNL  Sleep:  Good   Screenings: PHQ2-9    Flowsheet Row Video Visit from 10/12/2021 in [facility] Office Visit from 07/12/2021 in [facility] Video Visit from 02/11/2021 in [facility] Video Visit from 11/12/2020 in [facility] Office Visit from 12/13/2019 in [facility] [place]  PHQ-2 Total Score 0 4 0 0 0  PHQ-9 Total Score -- 11 -- -- 6   Flowsheet Row UC from 06/06/2024 in [facility] [place] UC from 05/17/2024 in [facility] at [facility] Video Visit from 10/12/2021 in [facility]  C-SSRS RISK CATEGORY No Risk No Risk No Risk     Assessment and Plan:  Miranda Harris is a 63 y.o. 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    1. Bipolar 1 disorder (HCC) History: admitted at age 86 due to mania, psychosis, depression, another episode at age 69 due to non adherence.  Originally on lithium  300 mg QID, trifluoperazine  2 mg qhs, lexapro  20 mg daily, xanax  0.5 mg qhsprn    The exam is notable for calm affect, and she reports steady improvement in depressive symptoms since uptitration of Viibryd .  Will continue current dose along with quetiapine  and lamotrigine  to target bipolar disorder.  Noted that she is not interested in metformin due to adverse reaction other people experienced.   2. Insomnia, unspecified type Stable.  Will continue current dose of quetiapine  to target insomnia and bipolar depression.    # high risk medication use     Last checked  EKG HR 64, QTc 389 msec 10/2022  Lipid panels LDL 120 H 01/2024  HbA1c Glu 71 01/2024      Plan:   Continue lamotrigine  25 mg three times a day- 50 mg caused drowsiness.  uptitrated 01/2024 Continue quetiapine  175 mg at night   Continue vilazodone  20 mg  Next appointment: 12/3 at 11 30, video - vitamin D  was within limits 2023    Past trials of medication: Sertraline, fluoxetine  (drowsiness), Lexapro  (nausea, brain fog),  Duloxetine, venlafaxine  (initially worked well, stopped working, hair loss), bupropion (insomnia),  Paxil  (urinary retention), Latuda , Abilify (flu like symptoms), Haldol (EPS), Thorazine (EPS), Stelazine ,  Lithium , Lunesta (sedation),  Ambien (sleepwalking, driving, eating), Trazodone  (excessive sedation), Xanax     The patient demonstrates the following  risk factors for suicide: Chronic risk factors for suicide include psychiatric disorder /bipolar disorder, history of substance use disorder, family history of suicide attempt. Acute risk factors for suicide include none. Protective factors for this patient include positive social support, responsibility to others (family), coping skills, hope for the future. The overall suicide risk at this point appears to be low, and she is appropriate for outpatient follow up.       Collaboration of Care: Collaboration of Care: Other reviewed notes in  Epic  Patient/Guardian was advised Release of Information must be obtained prior to any record release in order to collaborate their care with an outside provider. Patient/Guardian was advised if they have not already done so to contact the registration department to sign all necessary forms in order for us  to release information regarding their care.   Consent: Patient/Guardian gives verbal consent for treatment and assignment of benefits for services provided during this visit. Patient/Guardian expressed understanding and agreed to proceed.    [name], MD 06/21/2024, 10:24 AM

## 2024-06-21 ENCOUNTER — Telehealth (INDEPENDENT_AMBULATORY_CARE_PROVIDER_SITE_OTHER): Admitting: Psychiatry

## 2024-06-21 ENCOUNTER — Encounter: Payer: Self-pay | Admitting: Psychiatry

## 2024-06-21 DIAGNOSIS — F319 Bipolar disorder, unspecified: Secondary | ICD-10-CM

## 2024-06-21 DIAGNOSIS — G4700 Insomnia, unspecified: Secondary | ICD-10-CM

## 2024-06-21 MED ORDER — LAMOTRIGINE 25 MG PO TABS: 50.0000 mg | ORAL_TABLET | Freq: Three times a day (TID) | ORAL | 0 refills | Status: AC

## 2024-06-21 MED ORDER — QUETIAPINE FUMARATE 25 MG PO TABS: 25.0000 mg | ORAL_TABLET | Freq: Every day | ORAL | 1 refills | Status: AC

## 2024-06-21 MED ORDER — VILAZODONE HCL 20 MG PO TABS: 20.0000 mg | ORAL_TABLET | Freq: Every day | ORAL | 0 refills | Status: DC

## 2024-06-21 NOTE — Patient Instructions (Signed)
 Continue lamotrigine  25 mg three times a day Continue quetiapine  175 mg at night   Continue vilazodone  20 mg  Next appointment: 12/3 at 11 30

## 2024-06-28 ENCOUNTER — Telehealth: Payer: Self-pay | Admitting: Psychiatry

## 2024-06-28 NOTE — Telephone Encounter (Signed)
 Lamotrigine  05-25-24, 02-27-24, 08-26-22 Vilazodone  05-10-24, 04-11-24, 10-01-22   Spoke to [place]

## 2024-06-28 NOTE — Telephone Encounter (Signed)
 Left a voice message to inquire about her medication concerns. Advised her to contact the office if she needs refills. Based on the chart and pharmacy information, she should currently have enough medication available.

## 2024-06-28 NOTE — Telephone Encounter (Signed)
 Are those for 90 days (I have sent in 90 days each time)? If so, please ensure with her that she is taking the correct dose.

## 2024-07-04 NOTE — Telephone Encounter (Signed)
 Have made several attempt to reach patient no answer left voicemail for patient return call to office no return call

## 2024-07-15 ENCOUNTER — Ambulatory Visit (INDEPENDENT_AMBULATORY_CARE_PROVIDER_SITE_OTHER)

## 2024-07-15 ENCOUNTER — Ambulatory Visit: Admitting: Family Medicine

## 2024-07-15 ENCOUNTER — Encounter: Payer: Self-pay | Admitting: Family Medicine

## 2024-07-15 VITALS — BP 122/78 | HR 84 | Temp 99.1°F | Ht 68.0 in | Wt 192.0 lb

## 2024-07-15 DIAGNOSIS — M546 Pain in thoracic spine: Secondary | ICD-10-CM

## 2024-07-15 DIAGNOSIS — G8929 Other chronic pain: Secondary | ICD-10-CM

## 2024-07-15 DIAGNOSIS — E039 Hypothyroidism, unspecified: Secondary | ICD-10-CM

## 2024-07-15 DIAGNOSIS — M25552 Pain in left hip: Secondary | ICD-10-CM

## 2024-07-15 DIAGNOSIS — M25551 Pain in right hip: Secondary | ICD-10-CM | POA: Diagnosis not present

## 2024-07-15 DIAGNOSIS — N1832 Chronic kidney disease, stage 3b: Secondary | ICD-10-CM | POA: Diagnosis not present

## 2024-07-15 DIAGNOSIS — Z7689 Persons encountering health services in other specified circumstances: Secondary | ICD-10-CM | POA: Insufficient documentation

## 2024-07-15 NOTE — Assessment & Plan Note (Signed)
 Known arthritis in right hip. Left hip/low back pain for past 6 weeks. X-rays ordered. Referral placed to ortho.

## 2024-07-15 NOTE — Progress Notes (Signed)
 New Patient Office Visit  Subjective    Patient ID: Miranda Harris, female    DOB: December 14, 1960  Age: 63 y.o. MRN: [id number]  CC:  Chief Complaint  Patient presents with   Establish Care    Arthritis    HPI Miranda Harris [facility] presents to establish care with this practice. She is new to me. Has been getting primary care with [facility] and is transferring care to [facility].   Arthritis: Neck 3 years ago. PT made it worse, Right hip pain. Celebrex  did not help.  Right back pain is chronic.  6 weeks ago, left hip pain started. Pain is radiating down leg.  Feels achy. Worse with walking on it.  Sleep is affected. Sleeping on her back causing her to snore. Awakens and can't get back to sleep.  X-rays in the past showed arthritis.  Ibuprofen does not help too much.   Hypothyroid: Levothyroxine  150 mcg daily. Needs thyroid  function today.  Stage 3 kidney disease: unsure if 3a or 3b Will check BMP today  Bipolar:  Managed by [employer] [facility].      Outpatient Encounter Medications as of 07/15/2024  Medication Sig   celecoxib  (CELEBREX ) 200 MG capsule Take 200 mg by mouth 2 (two) times daily.   [START ON 08/25/2024] lamoTRIgine  (LAMICTAL ) 25 MG tablet Take 2 tablets (50 mg total) by mouth in the morning, at noon, and at bedtime.   Multiple Vitamin (MULTIVITAMIN WITH MINERALS) TABS tablet Take 1 tablet by mouth daily.   Omega-3 Fatty Acids (FISH OIL) 1000 MG CAPS Take 2,000 mg by mouth daily.   predniSONE  (STERAPRED UNI-PAK 21 TAB) 10 MG (21) TBPK tablet Take by mouth daily. Take 6 tabs by mouth daily  for 2 days, then 5 tabs for 2 days, then 4 tabs for 2 days, then 3 tabs for 2 days, 2 tabs for 2 days, then 1 tab by mouth daily for 2 days   QUEtiapine  (SEROQUEL ) 100 MG tablet Take 1 tablet (100 mg total) by mouth at bedtime. Total of 175 mg at night. Take along with 50 mg and 25 mg tab   [START ON 08/01/2024] QUEtiapine  (SEROQUEL ) 25 MG tablet Take 1 tablet  (25 mg total) by mouth at bedtime. Total of 175 mg at night. Take along with 100 mg, 50 mg tab   QUEtiapine  (SEROQUEL ) 50 MG tablet Take 1 tablet (50 mg total) by mouth at bedtime. Total of 175 mg at night. Take along with 100 mg and 50 mg tab   [START ON 08/09/2024] Vilazodone  HCl 20 MG TABS Take 1 tablet (20 mg total) by mouth daily.   Vitamin D , Ergocalciferol , (DRISDOL ) 1.25 MG (50000 UNIT) CAPS capsule Take 1 capsule (50,000 Units total) by mouth every 7 (seven) days.   Calcium Carbonate-Vitamin D  (CALTRATE 600+D PO) Take 2 tablets by mouth daily. (Patient not taking: Reported on 07/15/2024)   levothyroxine  (SYNTHROID ) 150 MCG tablet Take 1 tablet (150 mcg total) by mouth daily. (Patient not taking: Reported on 07/15/2024)   levothyroxine  (SYNTHROID ) 175 MCG tablet TAKE 1 TABLET BY MOUTH EVERY DAY (Patient not taking: Reported on 07/15/2024)   No facility-administered encounter medications on file as of 07/15/2024.    Past Medical History:  Diagnosis Date   Bipolar disorder (HCC)    Blood transfusion without reported diagnosis    Chronic kidney disease    Depression    Elevated serum creatinine    related to lithium  use   Helicobacter pylori (H. pylori) infection  pt denies   Hypothyroidism    Perirectal fistula    Thyroid  disease    Trochanteric bursitis of right hip     Past Surgical History:  Procedure Laterality Date   COLONOSCOPY  01/06/2016   at [facility]   EVALUATION UNDER ANESTHESIA WITH HEMORRHOIDECTOMY N/A 06/28/2017   Procedure: EXAM UNDER ANESTHESIA WITH EXTERNAL HEMORRHOIDECTOMY AND REPAIR OF PERIRECTAL FISTULA;  Surgeon: Sheldon Standing, MD;  Location: [facility];  Service: General;  Laterality: N/A;   HERNIA REPAIR  8-10 yrs ago   umbilical hernia   INCISION AND DRAINAGE PERIRECTAL ABSCESS N/A 04/18/2017   Procedure: IRRIGATION AND DEBRIDEMENT PERIRECTAL ABSCESS, placement of seton, sigmoidoscopy;  Surgeon: Mikell Katz, MD;  Location: [facility];   Service: General;  Laterality: N/A;   LAPAROSCOPIC APPENDECTOMY     POLYPECTOMY     VAGINAL HYSTERECTOMY Bilateral    complete   WISDOM TOOTH EXTRACTION      Family History  Problem Relation Age of Onset   Anxiety disorder Brother    Breast cancer Neg Hx    Colon cancer Neg Hx    Colon polyps Neg Hx    Esophageal cancer Neg Hx    Rectal cancer Neg Hx    Stomach cancer Neg Hx     Social History   Socioeconomic History   Marital status: Married    Spouse name: Not on file   Number of children: Not on file   Years of education: Not on file   Highest education level: Not on file  Occupational History   Not on file  Tobacco Use   Smoking status: Former    Current packs/day: 1.00    Average packs/day: 1 pack/day for 30.0 years (30.0 ttl pk-yrs)    Types: Cigarettes   Smokeless tobacco: Never   Tobacco comments:    quit 2008  Vaping Use   Vaping status: Never Used  Substance and Sexual Activity   Alcohol use: Yes    Comment: once a month per pt   Drug use: No   Sexual activity: Yes    Partners: Male    Birth control/protection: Surgical  Other Topics Concern   Not on file  Social History Narrative   Not on file   Social Drivers of Health   Financial Resource Strain: Low Risk  (05/14/2024)   Received from [facility]   Overall Financial Resource Strain (CARDIA)    How hard is it for you to pay for the very basics like food, housing, medical care, and heating?: Not hard at all  Food Insecurity: No Food Insecurity (05/14/2024)   Received from [facility]   Hunger Vital Sign    Within the past 12 months, you worried that your food would run out before you got the money to buy more.: Never true    Within the past 12 months, the food you bought just didn't last and you didn't have money to get more.: Never true  Transportation Needs: No Transportation Needs (05/14/2024)   Received from [facility] - Transportation    In the past 12 months, has lack of  transportation kept you from medical appointments or from getting medications?: No    In the past 12 months, has lack of transportation kept you from meetings, work, or from getting things needed for daily living?: No  Physical Activity: Insufficiently Active (05/14/2024)   Received from [facility]   Exercise Vital Sign    On average, how many days  per week do you engage in moderate to strenuous exercise (like a brisk walk)?: 4 days    On average, how many minutes do you engage in exercise at this level?: 30 min  Stress: Stress Concern Present (05/14/2024)   Received from [facility] - Occupational Stress Questionnaire    Do you feel stress - tense, restless, nervous, or anxious, or unable to sleep at night because your mind is troubled all the time - these days?: To some extent  Social Connections: Socially Integrated (05/14/2024)   Received from [facility]   Social Network    How would you rate your social network (family, work, friends)?: Good participation with social networks  Intimate Partner Violence: Not At Risk (05/14/2024)   Received from [facility]   HITS    Over the last 12 months how often did your partner physically hurt you?: Never    Over the last 12 months how often did your partner insult you or talk down to you?: Never    Over the last 12 months how often did your partner threaten you with physical harm?: Never    Over the last 12 months how often did your partner scream or curse at you?: Never    ROS      Objective    BP 122/78 (BP Location: Left Arm, Patient Position: Sitting)   Pulse 84   Temp 99.1 F (37.3 C) (Oral)   Ht 5' 8 (1.727 m)   Wt 192 lb (87.1 kg)   SpO2 97%   BMI 29.19 kg/m   Physical Exam Vitals and nursing note reviewed.  Constitutional:      General: She is not in acute distress.    Appearance: Normal appearance.  Cardiovascular:     Rate and Rhythm: Normal rate and regular rhythm.      Heart sounds: Normal heart sounds.  Pulmonary:     Effort: Pulmonary effort is normal.     Breath sounds: Normal breath sounds.  Musculoskeletal:     Comments: Normal gait. Slow and guarded.  Normal coordination. ROM normal. 5/5 upper extremity strength. 4/4 lower extremity strength.  Negative straight leg raise. Positive bony spinous process tenderness in thoracic area.  No loss of sensation. No rash.    Skin:    General: Skin is warm and dry.  Neurological:     General: No focal deficit present.     Mental Status: She is alert. Mental status is at baseline.  Psychiatric:        Mood and Affect: Mood normal.        Behavior: Behavior normal.        Thought Content: Thought content normal.        Judgment: Judgment normal.         Assessment & Plan:   Problem List Items Addressed This Visit     Bilateral hip pain   Known arthritis in right hip. Left hip/low back pain for past 6 weeks. X-rays ordered. Referral placed to ortho.       Relevant Orders   DG Hip Unilat W OR W/O Pelvis 2-3 Views Right   DG Hip Unilat W OR W/O Pelvis Min 4 Views Left   Ambulatory referral to Orthopedics   Adult hypothyroidism   Taking  levothyroxine  150 mcg daily as prescribed. Will get thyroid  function labs today.  Med adjustment as needed. No refills needed today.       Relevant Orders  TSH+T4F+T3Free   CKD (chronic kidney disease) stage 3, GFR 30-59 ml/min (HCC)   CMP today to stage. Recommend avoiding NSAIDs. Stay hydrated.       Relevant Orders   Comprehensive metabolic panel with GFR   Establishing care with new doctor, encounter for - Primary   Chronic midline thoracic back pain   No trauma. Some bony tenderness to palpation in thoracic area. Endorses neck with arthritis, no cervical tenderness today. X-ray ordered. Referral placed to ortho for evaluation.         Relevant Orders   DG Thoracic Spine 4V   Ambulatory referral to Orthopedics  Agrees  with plan of care discussed.  Questions answered.   Return if symptoms worsen or fail to improve.   Darice JONELLE Brownie, FNP

## 2024-07-15 NOTE — Assessment & Plan Note (Signed)
 CMP today to stage. Recommend avoiding NSAIDs. Stay hydrated.

## 2024-07-15 NOTE — Patient Instructions (Signed)
 [facility]  [address]  The radiology department is on the first floor which is best accessed by going around to the back of the building. No appointment necessary. You can go at your convenience.

## 2024-07-15 NOTE — Assessment & Plan Note (Signed)
 No trauma. Some bony tenderness to palpation in thoracic area. Endorses neck with arthritis, no cervical tenderness today. X-ray ordered. Referral placed to ortho for evaluation.

## 2024-07-15 NOTE — Assessment & Plan Note (Signed)
 Taking  levothyroxine  150 mcg daily as prescribed. Will get thyroid  function labs today.  Med adjustment as needed. No refills needed today.

## 2024-07-16 ENCOUNTER — Ambulatory Visit: Payer: Self-pay | Admitting: Family Medicine

## 2024-07-16 DIAGNOSIS — E039 Hypothyroidism, unspecified: Secondary | ICD-10-CM

## 2024-07-16 LAB — TSH+T4F+T3FREE
Free T4: 1.72 ng/dL (ref 0.82–1.77)
T3, Free: 2.1 pg/mL (ref 2.0–4.4)
TSH: 0.191 u[IU]/mL — ABNORMAL LOW (ref 0.450–4.500)

## 2024-07-16 LAB — COMPREHENSIVE METABOLIC PANEL WITH GFR
ALT: 18 IU/L (ref 0–32)
AST: 23 IU/L (ref 0–40)
Albumin: 4.4 g/dL (ref 3.9–4.9)
Alkaline Phosphatase: 86 IU/L (ref 49–135)
BUN/Creatinine Ratio: 20 (ref 12–28)
BUN: 30 mg/dL — ABNORMAL HIGH (ref 8–27)
Bilirubin Total: 0.2 mg/dL (ref 0.0–1.2)
CO2: 22 mmol/L (ref 20–29)
Calcium: 9.8 mg/dL (ref 8.7–10.3)
Chloride: 104 mmol/L (ref 96–106)
Creatinine, Ser: 1.51 mg/dL — ABNORMAL HIGH (ref 0.57–1.00)
Globulin, Total: 2 g/dL (ref 1.5–4.5)
Glucose: 92 mg/dL (ref 70–99)
Potassium: 5.2 mmol/L (ref 3.5–5.2)
Sodium: 138 mmol/L (ref 134–144)
Total Protein: 6.4 g/dL (ref 6.0–8.5)
eGFR: 38 mL/min/1.73 — ABNORMAL LOW (ref >59)

## 2024-07-16 MED ORDER — LEVOTHYROXINE SODIUM 125 MCG PO TABS: 125.0000 ug | ORAL_TABLET | Freq: Every day | ORAL | 1 refills | Status: DC

## 2024-07-23 ENCOUNTER — Ambulatory Visit

## 2024-07-23 ENCOUNTER — Ambulatory Visit (HOSPITAL_BASED_OUTPATIENT_CLINIC_OR_DEPARTMENT_OTHER)

## 2024-07-23 DIAGNOSIS — Z23 Encounter for immunization: Secondary | ICD-10-CM | POA: Diagnosis not present

## 2024-07-26 ENCOUNTER — Other Ambulatory Visit: Payer: Self-pay

## 2024-07-26 ENCOUNTER — Ambulatory Visit (INDEPENDENT_AMBULATORY_CARE_PROVIDER_SITE_OTHER): Admitting: Physician Assistant

## 2024-07-26 ENCOUNTER — Encounter: Payer: Self-pay | Admitting: Physician Assistant

## 2024-07-26 DIAGNOSIS — M25552 Pain in left hip: Secondary | ICD-10-CM

## 2024-07-26 MED ORDER — HYDROCODONE-ACETAMINOPHEN 5-325 MG PO TABS: 1.0000 | ORAL_TABLET | Freq: Four times a day (QID) | ORAL | 0 refills | Status: DC | PRN

## 2024-07-26 NOTE — Progress Notes (Signed)
 Office Visit Note   Patient: Miranda Harris           Date of Birth: [birth year]           MRN: [id number] Visit Date: 07/26/2024              Requested by: Booker Darice SAUNDERS, FNP [address] [place],  [place] [address] PCP: Booker Darice SAUNDERS, FNP   Assessment & Plan: Visit Diagnoses:  1. Pain in left hip     Plan: Patient is a 64 year old woman who comes in today with a chief complaint of bilateral left greater than right hip pain.  Also has low back pain.  Denies any weakness loss of bowel or bladder control.  She has been seen by other providers and had Celebrex  as she cannot take anti-inflammatories.  She said the Celebrex  really did not help her at all.  She is having difficulty sleeping on either side.  And she has significant pain in the left groin with movement.  She has had multiple x-rays that demonstrate degenerative changes within both hips.  She also has degenerative changes in her back.  I do think the hip and groin pain on the left is most significant to her.  I talked to her about physical therapy but she has declined to this.  Her medication options are limited.  I talked about trying an intra-articular injection into her left hip or and or a trochanteric bursa inject.  She said she has had those in the past but they really did not help her.  I offered her a steroid taper to try to get her pain under control she declined taking this.  She did request something for pain so she can at least sleep at night.  I will give her 1 prescription for hydrocodone with Tylenol .  She understands we can give her a referral to chronic pain management if she would like to go forward with that we will get an MRI of her left hip just because of development of her pain it was a fairly quick onset want to rule out anything like avascular necrosis.  Will contact her once I review the MRI  Follow-Up Instructions: No follow-ups on file.   Orders:  No orders of the defined types were placed in this  encounter.  No orders of the defined types were placed in this encounter.     Procedures: No procedures performed   Clinical Data: No additional findings.   Subjective: Chief Complaint  Patient presents with   Left Hip - Pain   Right Hip - Pain    HPI patient is a 63 year old woman comes in today with bilateral lateral hip pain left greater than right as well as groin pain significant on the left.  She has some lower back pain as well although no loss of bowel or bladder control no weakness.  Review of Systems  All other systems reviewed and are negative.    Objective: Vital Signs: There were no vitals taken for this visit.  Physical Exam Constitutional:      Appearance: Normal appearance.  Pulmonary:     Effort: Pulmonary effort is normal.  Skin:    General: Skin is warm and dry.  Neurological:     Mental Status: She is alert.  Psychiatric:        Mood and Affect: Mood normal.        Behavior: Behavior normal.     Ortho Exam Examination of her  lower back she has no pain with forward flexion some pain over the lower back on the right with extension.  She has no step-offs no deformity.  With regards to her hip on the left hip she does have some stiffness and pain with internal and external rotation logrolling.  She has good extension flexion negative straight leg raise.  Strength is intact.  Right hip close much more fluidly.  Without any motor deficits.  She does have some tenderness left greater than right on the trochanteric bursa. Specialty Comments:  No specialty comments available.  Imaging: No results found.   PMFS History: Patient Active Problem List   Diagnosis Date Noted   Establishing care with new doctor, encounter for 07/15/2024   Chronic midline thoracic back pain 07/15/2024   Vitamin D  deficiency 09/04/2020   Left carpal tunnel syndrome 11/22/2018   CKD (chronic kidney disease) stage 3, GFR 30-59 ml/min (HCC) 11/09/2018   Daytime somnolence  06/20/2018   Intersphincteric fistula s/p LIFT repair 06/28/2017 06/28/2017   Bipolar I disorder, most recent episode depressed (HCC) 06/22/2016   Trochanteric bursitis of right hip 05/04/2016   Bursitis, trochanteric 04/19/2015   H/O bone density study 12/10/2014   History of Helicobacter pylori infection 12/10/2014   Elevated serum creatinine 08/26/2014   Bipolar 1 disorder (HCC) 06/25/2014   Bilateral hip pain 06/25/2014   Adult hypothyroidism 06/25/2014   Fatigue 06/25/2014   Past Medical History:  Diagnosis Date   Bipolar disorder (HCC)    Blood transfusion without reported diagnosis    Chronic kidney disease    Depression    Elevated serum creatinine    related to lithium  use   Helicobacter pylori (H. pylori) infection    pt denies   Hypothyroidism    Perirectal fistula    Thyroid  disease    Trochanteric bursitis of right hip     Family History  Problem Relation Age of Onset   Anxiety disorder Brother    Breast cancer Neg Hx    Colon cancer Neg Hx    Colon polyps Neg Hx    Esophageal cancer Neg Hx    Rectal cancer Neg Hx    Stomach cancer Neg Hx     Past Surgical History:  Procedure Laterality Date   COLONOSCOPY  01/06/2016   at [facility]   EVALUATION UNDER ANESTHESIA WITH HEMORRHOIDECTOMY N/A 06/28/2017   Procedure: EXAM UNDER ANESTHESIA WITH EXTERNAL HEMORRHOIDECTOMY AND REPAIR OF PERIRECTAL FISTULA;  Surgeon: Sheldon Standing, MD;  Location: [facility];  Service: General;  Laterality: N/A;   HERNIA REPAIR  8-10 yrs ago   umbilical hernia   INCISION AND DRAINAGE PERIRECTAL ABSCESS N/A 04/18/2017   Procedure: IRRIGATION AND DEBRIDEMENT PERIRECTAL ABSCESS, placement of seton, sigmoidoscopy;  Surgeon: Mikell Katz, MD;  Location: [facility];  Service: General;  Laterality: N/A;   LAPAROSCOPIC APPENDECTOMY     POLYPECTOMY     VAGINAL HYSTERECTOMY Bilateral    complete   WISDOM TOOTH EXTRACTION     Social History   Occupational History   Not on file   Tobacco Use   Smoking status: Former    Current packs/day: 1.00    Average packs/day: 1 pack/day for 30.0 years (30.0 ttl pk-yrs)    Types: Cigarettes   Smokeless tobacco: Never   Tobacco comments:    quit 2008  Vaping Use   Vaping status: Never Used  Substance and Sexual Activity   Alcohol use: Yes    Comment: once a month per pt  Drug use: No   Sexual activity: Yes    Partners: Male    Birth control/protection: Surgical

## 2024-07-29 ENCOUNTER — Other Ambulatory Visit: Payer: Self-pay | Admitting: Physician Assistant

## 2024-07-29 ENCOUNTER — Encounter: Payer: Self-pay | Admitting: Radiology

## 2024-07-29 MED ORDER — TRAMADOL HCL 50 MG PO TABS: 50.0000 mg | ORAL_TABLET | Freq: Four times a day (QID) | ORAL | 0 refills | Status: DC | PRN

## 2024-07-30 ENCOUNTER — Telehealth: Payer: Self-pay

## 2024-07-30 NOTE — Telephone Encounter (Signed)
 Medication management - Call with patient, after she left a message she would need the Quetiapine  25 mg order to start this week. Informed Dr. Vickey had already [facility] to be able to fill on 08/01/24. Informed this order should be on file there as Dr. Hisada e-scribed it on 06/24/24 and was confirmed received.  Patient to call back if any issues filling the medication as ordered.

## 2024-08-10 ENCOUNTER — Other Ambulatory Visit: Payer: Self-pay | Admitting: Psychiatry

## 2024-08-11 ENCOUNTER — Other Ambulatory Visit: Payer: Self-pay | Admitting: Family Medicine

## 2024-08-11 DIAGNOSIS — E039 Hypothyroidism, unspecified: Secondary | ICD-10-CM

## 2024-08-15 ENCOUNTER — Inpatient Hospital Stay: Admission: RE | Admit: 2024-08-15 | Source: Ambulatory Visit

## 2024-08-18 ENCOUNTER — Ambulatory Visit
Admission: RE | Admit: 2024-08-18 | Discharge: 2024-08-18 | Disposition: A | Discharge status: Home / Self Care | Source: Ambulatory Visit | Attending: Physician Assistant | Admitting: Physician Assistant

## 2024-08-18 DIAGNOSIS — M25552 Pain in left hip: Secondary | ICD-10-CM

## 2024-08-21 ENCOUNTER — Encounter: Payer: Self-pay | Admitting: Physical Medicine and Rehabilitation

## 2024-08-21 ENCOUNTER — Other Ambulatory Visit: Payer: Self-pay | Admitting: Physician Assistant

## 2024-08-21 MED ORDER — TRAMADOL HCL 50 MG PO TABS: 50.0000 mg | ORAL_TABLET | Freq: Two times a day (BID) | ORAL | 0 refills | Status: DC | PRN

## 2024-08-24 NOTE — Progress Notes (Unsigned)
 Virtual Visit via Video Note  I connected with Miranda Harris on 08/28/24 at 11:30 AM EST by a video enabled telemedicine application and verified that I am speaking with the correct person using two identifiers.  Location: Patient: home Provider: home office Persons participated in the visit- patient, provider    I discussed the limitations of evaluation and management by telemedicine and the availability of in person appointments. The patient expressed understanding and agreed to proceed.    I discussed the assessment and treatment plan with the patient. The patient was provided an opportunity to ask questions and all were answered. The patient agreed with the plan and demonstrated an understanding of the instructions.   The patient was advised to call back or seek an in-person evaluation if the symptoms worsen or if the condition fails to improve as anticipated.   [name], MD    [facility] MD/PA/NP OP Progress Note  08/28/2024 11:59 AM Miranda Harris  MRN:  [id number]  Chief Complaint:  Chief Complaint  Patient presents with   Follow-up   HPI:  - since the last visit, levothyroxine  was reduced by her PCP This is a follow-up appointment for bipolar 1 disorder, insomnia.  She states that she has been doing mentally okay, that is stressed due to physical problems.  She has hip and back pain and was found to have osteoporosis.  She has occasional insomnia due to pain, although she has been able to function enough.  She was found to have worsening in creatinine.  She has form her urine.  She has an upcoming appointment with nephrologist.  She expressed understanding to also discussed with the possible treatment related to osteoporosis.  She tends to feel down in the morning.  She has noticed that since retirement.  However, she has been trying to stay positive.  She had a good Thanksgiving with her husband.  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  She goes out  with a lady through the church a few times per month.  She has lost some weight, and has some decrease in appetite, which she attributes to her age.  She agrees to monitor it over time.  She denies SI, HI, hallucinations.  She denies decreased need for sleep or euphoria.  She agrees with the plans as outlined below.   183 lbs Wt Readings from Last 3 Encounters:  07/15/24 192 lb (87.1 kg)  05/17/24 189 lb (85.7 kg)  07/12/21 192 lb 12.8 oz (87.5 kg)     Substance use   Tobacco Alcohol Other substances/  Current   Denies- cannot drink denies  Past   Used to drink a good amount when she was in [employer], cocaine, occasionally when she was in college, high school  Past Treatment           Visit Diagnosis:    ICD-10-CM   1. Bipolar 1 disorder (HCC)  F31.9     2. Insomnia, unspecified type  G47.00       Past Psychiatric History: Please see initial evaluation for full details. I have reviewed the history. No updates at this time.     Past Medical History:  Past Medical History:  Diagnosis Date   Bipolar disorder (HCC)    Blood transfusion without reported diagnosis    Chronic kidney disease    Depression    Elevated serum creatinine    related to lithium  use   Helicobacter pylori (H. pylori) infection    pt  denies   Hypothyroidism    Perirectal fistula    Thyroid  disease    Trochanteric bursitis of right hip     Past Surgical History:  Procedure Laterality Date   COLONOSCOPY  01/06/2016   at [facility]   EVALUATION UNDER ANESTHESIA WITH HEMORRHOIDECTOMY N/A 06/28/2017   Procedure: EXAM UNDER ANESTHESIA WITH EXTERNAL HEMORRHOIDECTOMY AND REPAIR OF PERIRECTAL FISTULA;  Surgeon: Sheldon Standing, MD;  Location: [facility];  Service: General;  Laterality: N/A;   HERNIA REPAIR  8-10 yrs ago   umbilical hernia   INCISION AND DRAINAGE PERIRECTAL ABSCESS N/A 04/18/2017   Procedure: IRRIGATION AND DEBRIDEMENT PERIRECTAL ABSCESS, placement of seton, sigmoidoscopy;   Surgeon: Mikell Katz, MD;  Location: [facility];  Service: General;  Laterality: N/A;   LAPAROSCOPIC APPENDECTOMY     POLYPECTOMY     VAGINAL HYSTERECTOMY Bilateral    complete   WISDOM TOOTH EXTRACTION      Family Psychiatric History: Please see initial evaluation for full details. I have reviewed the history. No updates at this time.     Family History:  Family History  Problem Relation Age of Onset   Anxiety disorder Brother    Breast cancer Neg Hx    Colon cancer Neg Hx    Colon polyps Neg Hx    Esophageal cancer Neg Hx    Rectal cancer Neg Hx    Stomach cancer Neg Hx     Social History:  Social History   Socioeconomic History   Marital status: Married    Spouse name: Not on file   Number of children: Not on file   Years of education: Not on file   Highest education level: Not on file  Occupational History   Not on file  Tobacco Use   Smoking status: Former    Current packs/day: 1.00    Average packs/day: 1 pack/day for 30.0 years (30.0 ttl pk-yrs)    Types: Cigarettes   Smokeless tobacco: Never   Tobacco comments:    quit 2008  Vaping Use   Vaping status: Never Used  Substance and Sexual Activity   Alcohol use: Yes    Comment: once a month per pt   Drug use: No   Sexual activity: Yes    Partners: Male    Birth control/protection: Surgical  Other Topics Concern   Not on file  Social History Narrative   Not on file   Social Drivers of Health   Financial Resource Strain: Low Risk  (05/14/2024)   Received from [facility]   Overall Financial Resource Strain (CARDIA)    How hard is it for you to pay for the very basics like food, housing, medical care, and heating?: Not hard at all  Food Insecurity: No Food Insecurity (05/14/2024)   Received from [facility]   Hunger Vital Sign    Within the past 12 months, you worried that your food would run out before you got the money to buy more.: Never true    Within the past 12 months, the food you bought  just didn't last and you didn't have money to get more.: Never true  Transportation Needs: No Transportation Needs (05/14/2024)   Received from [facility] - Transportation    In the past 12 months, has lack of transportation kept you from medical appointments or from getting medications?: No    In the past 12 months, has lack of transportation kept you from meetings, work, or from getting things needed  for daily living?: No  Physical Activity: Insufficiently Active (05/14/2024)   Received from [facility]   Exercise Vital Sign    On average, how many days per week do you engage in moderate to strenuous exercise (like a brisk walk)?: 4 days    On average, how many minutes do you engage in exercise at this level?: 30 min  Stress: Stress Concern Present (05/14/2024)   Received from [facility] - Occupational Stress Questionnaire    Do you feel stress - tense, restless, nervous, or anxious, or unable to sleep at night because your mind is troubled all the time - these days?: To some extent  Social Connections: Socially Integrated (05/14/2024)   Received from [facility]   Social Network    How would you rate your social network (family, work, friends)?: Good participation with social networks    Allergies:  Allergies  Allergen Reactions   Metrogel  [Metronidazole ] Other (See Comments)    Burn face   Tetracyclines & Related Rash    Metabolic Disorder Labs: Lab Results  Component Value Date   HGBA1C 5.5 09/04/2020   MPG 108 06/10/2015   No results found for: PROLACTIN Lab Results  Component Value Date   CHOL 205 (H) 12/25/2020   TRIG 76.0 12/25/2020   HDL 73.30 12/25/2020   CHOLHDL 3 12/25/2020   VLDL 15.2 12/25/2020   LDLCALC 116 (H) 12/25/2020   LDLCALC 122 (H) 09/04/2020   Lab Results  Component Value Date   TSH 0.191 (L) 07/15/2024   TSH 0.15 (A) 10/04/2023    Therapeutic Level Labs: Lab Results  Component  Value Date   LITHIUM  1.2 11/08/2018   LITHIUM  0.80 06/10/2015   No results found for: VALPROATE No results found for: CBMZ  Current Medications: Current Outpatient Medications  Medication Sig Dispense Refill   Calcium Carbonate-Vitamin D  (CALTRATE 600+D PO) Take 2 tablets by mouth daily. (Patient not taking: Reported on 07/15/2024)     celecoxib  (CELEBREX ) 200 MG capsule Take 200 mg by mouth 2 (two) times daily.     lamoTRIgine  (LAMICTAL ) 25 MG tablet Take 2 tablets (50 mg total) by mouth in the morning, at noon, and at bedtime. 540 tablet 0   levothyroxine  (SYNTHROID ) 125 MCG tablet Take 1 tablet (125 mcg total) by mouth daily. 30 tablet 1   Multiple Vitamin (MULTIVITAMIN WITH MINERALS) TABS tablet Take 1 tablet by mouth daily.     Omega-3 Fatty Acids (FISH OIL) 1000 MG CAPS Take 2,000 mg by mouth daily.     predniSONE  (STERAPRED UNI-PAK 21 TAB) 10 MG (21) TBPK tablet Take by mouth daily. Take 6 tabs by mouth daily  for 2 days, then 5 tabs for 2 days, then 4 tabs for 2 days, then 3 tabs for 2 days, 2 tabs for 2 days, then 1 tab by mouth daily for 2 days 42 tablet 0   QUEtiapine  (SEROQUEL ) 100 MG tablet Take 1 tablet (100 mg total) by mouth at bedtime. Total of 175 mg at night. Take along with 50 mg and 25 mg tab 90 tablet 1   QUEtiapine  (SEROQUEL ) 25 MG tablet Take 1 tablet (25 mg total) by mouth at bedtime. Total of 175 mg at night. Take along with 100 mg, 50 mg tab 90 tablet 1   QUEtiapine  (SEROQUEL ) 50 MG tablet Take 1 tablet (50 mg total) by mouth at bedtime. Total of 175 mg at night. Take along with 100 mg and 50 mg  tab 90 tablet 1   traMADol  (ULTRAM ) 50 MG tablet Take 1 tablet (50 mg total) by mouth every 12 (twelve) hours as needed. 30 tablet 0   Vilazodone  HCl 20 MG TABS Take 1 tablet (20 mg total) by mouth daily. 90 tablet 0   Vitamin D , Ergocalciferol , (DRISDOL ) 1.25 MG (50000 UNIT) CAPS capsule Take 1 capsule (50,000 Units total) by mouth every 7 (seven) days. 12 capsule 0    No current facility-administered medications for this visit.     Musculoskeletal: Strength & Muscle Tone: N/A Gait & Station: N/A Patient leans: N/A  Psychiatric Specialty Exam: Review of Systems  Psychiatric/Behavioral:  Positive for sleep disturbance. Negative for agitation, behavioral problems, confusion, decreased concentration, dysphoric mood, hallucinations, self-injury and suicidal ideas. The patient is not nervous/anxious and is not hyperactive.   All other systems reviewed and are negative.   There were no vitals taken for this visit.There is no height or weight on file to calculate BMI.  General Appearance: Well Groomed  Eye Contact:  Good  Speech:  Clear and Coherent  Volume:  Normal  Mood:  good  Affect:  Appropriate, Congruent, and Full Range  Thought Process:  Coherent  Orientation:  Full (Time, Place, and Person)  Thought Content: Logical   Suicidal Thoughts:  No  Homicidal Thoughts:  No  Memory:  Immediate;   Good  Judgement:  Good  Insight:  Good  Psychomotor Activity:  Normal  Concentration:  Concentration: Good and Attention Span: Good  Recall:  Good  Fund of Knowledge: Good  Language: Good  Akathisia:  No  Handed:  Right  AIMS (if indicated): not done  Assets:  Communication Skills Desire for Improvement  ADL's:  Intact  Cognition: WNL  Sleep:  Fair   Screenings: PHQ2-9    Flowsheet Row Video Visit from 10/12/2021 in [facility] Office Visit from 07/12/2021 in [facility] Video Visit from 02/11/2021 in [facility] Video Visit from 11/12/2020 in [facility] Office Visit from 12/13/2019 in [facility] [place]  PHQ-2 Total Score 0 4 0 0 0  PHQ-9 Total Score -- 11 -- -- 6   Flowsheet Row UC from 06/06/2024 in [facility] [place] UC from 05/17/2024  in [facility] Video Visit from 10/12/2021 in [facility]  C-SSRS RISK CATEGORY No Risk No Risk No Risk     Assessment and Plan:  Miranda Harris is a 63 y.o. 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   1. Bipolar 1 disorder (HCC) History: admitted at age 39 due to mania, psychosis, depression, another episode at age 19 due to non adherence. Originally on lithium  300 mg QID, trifluoperazine  2 mg qhs, lexapro  20 mg daily, xanax  0.5 mg qhsprn    Although she reports stress related to her medical condition, her mood has been stable since the previous visit.  While she reports some pattern of feeling down in the morning, it has been manageable.  She is actively engaged in church activities, and has good connection with her friend.  Will continue current dose of lamotrigine  and quetiapine  for bipolar disorder.   Noted that she is not interested in metformin due to adverse reaction other people  experienced.  Will continue Viibryd  to target bipolar depression, off label.   2. Insomnia, unspecified type Slightly worsening related to pain.  Will continue current dose of quetiapine  to target insomnia and bipolar depression.      # high risk medication use     Last checked  EKG HR 64, QTc 389 msec 10/2022  Lipid panels LDL 120 H 01/2024  HbA1c Glu 71 01/2024      Plan:   Continue lamotrigine  25 mg three times a day- 50 mg caused drowsiness.  uptitrated 01/2024 Continue quetiapine  175 mg at night   Continue vilazodone  20 mg  Next appointment: 1/23 at 11 30, video - vitamin D  was within limits 2023    Past trials of medication: Sertraline, fluoxetine  (drowsiness), Lexapro  (nausea, brain fog),  Duloxetine, venlafaxine  (initially worked well, stopped working,  hair loss), bupropion (insomnia),  Paxil  (urinary retention), Latuda , Abilify (flu like symptoms), Haldol (EPS), Thorazine (EPS), Stelazine ,  Lithium , Lunesta (sedation), Ambien (sleepwalking, driving, eating), Trazodone  (excessive sedation), Xanax     The patient demonstrates the following  risk factors for suicide: Chronic risk factors for suicide include psychiatric disorder /bipolar disorder, history of substance use disorder, family history of suicide attempt. Acute risk factors for suicide include none. Protective factors for this patient include positive social support, responsibility to others (family), coping skills, hope for the future. The overall suicide risk at this point appears to be low, and she is appropriate for outpatient follow up.       Collaboration of Care: Collaboration of Care: Other reviewed notes in Epic  Patient/Guardian was advised Release of Information must be obtained prior to any record release in order to collaborate their care with an outside provider. Patient/Guardian was advised if they have not already done so to contact the registration department to sign all necessary forms in order for us  to release information regarding their care.   Consent: Patient/Guardian gives verbal consent for treatment and assignment of benefits for services provided during this visit. Patient/Guardian expressed understanding and agreed to proceed.    [name], MD 08/28/2024, 11:59 AM

## 2024-08-28 ENCOUNTER — Other Ambulatory Visit: Payer: Self-pay

## 2024-08-28 ENCOUNTER — Encounter: Payer: Self-pay | Admitting: Psychiatry

## 2024-08-28 ENCOUNTER — Telehealth: Admitting: Psychiatry

## 2024-08-28 DIAGNOSIS — F319 Bipolar disorder, unspecified: Secondary | ICD-10-CM | POA: Diagnosis not present

## 2024-08-28 DIAGNOSIS — G4700 Insomnia, unspecified: Secondary | ICD-10-CM | POA: Diagnosis not present

## 2024-08-28 DIAGNOSIS — M545 Low back pain, unspecified: Secondary | ICD-10-CM

## 2024-08-28 NOTE — Patient Instructions (Signed)
 Continue lamotrigine  25 mg three times a day Continue quetiapine  175 mg at night   Continue vilazodone  20 mg  Next appointment: 12/3 at 11 30

## 2024-08-30 ENCOUNTER — Encounter: Payer: Self-pay | Admitting: Radiology

## 2024-08-30 ENCOUNTER — Encounter (HOSPITAL_BASED_OUTPATIENT_CLINIC_OR_DEPARTMENT_OTHER): Payer: Self-pay

## 2024-09-03 ENCOUNTER — Ambulatory Visit (HOSPITAL_BASED_OUTPATIENT_CLINIC_OR_DEPARTMENT_OTHER): Admitting: Physician Assistant

## 2024-09-03 ENCOUNTER — Ambulatory Visit (HOSPITAL_BASED_OUTPATIENT_CLINIC_OR_DEPARTMENT_OTHER): Admitting: Student

## 2024-09-05 ENCOUNTER — Other Ambulatory Visit: Payer: Self-pay | Admitting: Family Medicine

## 2024-09-05 DIAGNOSIS — E039 Hypothyroidism, unspecified: Secondary | ICD-10-CM

## 2024-09-11 ENCOUNTER — Encounter: Payer: Self-pay | Admitting: Physician Assistant

## 2024-09-11 ENCOUNTER — Ambulatory Visit: Admitting: Physician Assistant

## 2024-09-11 DIAGNOSIS — M545 Low back pain, unspecified: Secondary | ICD-10-CM

## 2024-09-11 DIAGNOSIS — M7062 Trochanteric bursitis, left hip: Secondary | ICD-10-CM

## 2024-09-11 MED ORDER — LIDOCAINE HCL 1 % IJ SOLN
2.0000 mL | INTRAMUSCULAR | Status: AC | PRN
  Administered 2024-09-11: 15:00:00 2 mL

## 2024-09-11 MED ORDER — METHYLPREDNISOLONE ACETATE 40 MG/ML IJ SUSP
40.0000 mg | INTRAMUSCULAR | Status: AC | PRN
  Administered 2024-09-11: 15:00:00 40 mg via INTRA_ARTICULAR

## 2024-09-11 MED ORDER — BUPIVACAINE HCL 0.25 % IJ SOLN
2.0000 mL | INTRAMUSCULAR | Status: AC | PRN
  Administered 2024-09-11: 15:00:00 2 mL via INTRA_ARTICULAR

## 2024-09-11 NOTE — Progress Notes (Signed)
 Office Visit Note   Patient: Miranda Harris           Date of Birth: [birth year]           MRN: [id number] Visit Date: 09/11/2024              Requested by: Booker Darice SAUNDERS, FNP [address] [place],  [place] [address] PCP: Booker Darice SAUNDERS, FNP  Chief Complaint  Patient presents with   Left Hip - Pain      HPI: Patient is a pleasant 63 year old woman comes in today for a left hip trochanteric bursa injection.  Assessment & Plan: Visit Diagnoses:  1. Low back pain, unspecified back pain laterality, unspecified chronicity, unspecified whether sciatica present     Plan: Will see how she does with this.  Also she has requested referral to be seen for her low back I had mentioned to Duwaine Pouch at one of our earlier appointments.  We will refer her to Megan for evaluation.  She also has an appointment with chronic pain management in February.  Follow-Up Instructions: Return if symptoms worsen or fail to improve.   Ortho Exam  Patient is alert, oriented, no adenopathy, well-dressed, normal affect, normal respiratory effort. Examination of the left hip she is neurovascular intact no redness good range of motion she is acutely tender to palpation over the trochanteric bursa    Imaging: No results found. No images are attached to the encounter.  Labs: Lab Results  Component Value Date   HGBA1C 5.5 09/04/2020   HGBA1C 5.4 06/10/2015   ESRSEDRATE 3 04/08/2020   ESRSEDRATE 13 11/29/2019   CRP 1 04/08/2020   CRP <1.0 11/29/2019   REPTSTATUS 04/24/2017 FINAL 04/18/2017   GRAMSTAIN  04/18/2017    RARE WBC PRESENT, PREDOMINANTLY PMN NO ORGANISMS SEEN Performed at [facility], [address], [place], [place] [address]    CULT  04/18/2017    RARE NORMAL SKIN FLORA AEROBICALLY MIXED ANAEROBIC FLORA PRESENT.  CALL LAB IF FURTHER IID REQUIRED.      Lab Results  Component Value Date   ALBUMIN 4.4 07/15/2024   ALBUMIN 4.3 09/04/2020   ALBUMIN 4.3 04/08/2020     No results found for: MG Lab Results  Component Value Date   VD25OH 37.34 05/28/2021   VD25OH 39.14 03/08/2021   VD25OH 35.06 12/25/2020    No results found for: PREALBUMIN    Latest Ref Rng & Units 09/04/2020    7:44 AM 11/29/2019    9:06 AM 11/30/2018   10:47 AM  CBC EXTENDED  WBC 4.0 - 10.5 K/uL 5.3  5.6  5.4   RBC 3.87 - 5.11 Mil/uL 4.32  4.18  4.20   Hemoglobin 12.0 - 15.0 g/dL 86.8  86.9  87.3   HCT 36.0 - 46.0 % 39.9  38.7  38.5   Platelets 150.0 - 400.0 K/uL 261.0  261.0  257.0   NEUT# 1.4 - 7.7 K/uL 2.7  3.4  2.6   Lymph# 0.7 - 4.0 K/uL 1.9  1.7  1.9      There is no height or weight on file to calculate BMI.  Orders:  Orders Placed This Encounter  Procedures   Ambulatory referral to Physical Medicine Rehab   No orders of the defined types were placed in this encounter.    Procedures: Large Joint Inj: L greater trochanter on 09/11/2024 3:19 PM Indications: pain and diagnostic evaluation Details: 22 G 1.5 in and 3.5 in needle, lateral approach  Arthrogram: No  Medications: 2 mL lidocaine  1 %; 40 mg methylPREDNISolone  acetate 40 MG/ML; 2 mL bupivacaine  0.25 % Outcome: tolerated well, no immediate complications Procedure, treatment alternatives, risks and benefits explained, specific risks discussed. Consent was given by the patient.     Clinical Data: No additional findings.  ROS:  All other systems negative, except as noted in the HPI. Review of Systems  Objective: Vital Signs: There were no vitals taken for this visit.  Specialty Comments:  No specialty comments available.  PMFS History: Patient Active Problem List   Diagnosis Date Noted   Establishing care with new doctor, encounter for 07/15/2024   Chronic midline thoracic back pain 07/15/2024   Vitamin D  deficiency 09/04/2020   Left carpal tunnel syndrome 11/22/2018   CKD (chronic kidney disease) stage 3, GFR 30-59 ml/min (HCC) 11/09/2018   Daytime somnolence 06/20/2018    Intersphincteric fistula s/p LIFT repair 06/28/2017 06/28/2017   Bipolar I disorder, most recent episode depressed (HCC) 06/22/2016   Trochanteric bursitis of right hip 05/04/2016   Bursitis, trochanteric 04/19/2015   H/O bone density study 12/10/2014   History of Helicobacter pylori infection 12/10/2014   Elevated serum creatinine 08/26/2014   Bipolar 1 disorder (HCC) 06/25/2014   Bilateral hip pain 06/25/2014   Adult hypothyroidism 06/25/2014   Fatigue 06/25/2014   Past Medical History:  Diagnosis Date   Bipolar disorder (HCC)    Blood transfusion without reported diagnosis    Chronic kidney disease    Depression    Elevated serum creatinine    related to lithium  use   Helicobacter pylori (H. pylori) infection    pt denies   Hypothyroidism    Perirectal fistula    Thyroid  disease    Trochanteric bursitis of right hip     Family History  Problem Relation Age of Onset   Anxiety disorder Brother    Breast cancer Neg Hx    Colon cancer Neg Hx    Colon polyps Neg Hx    Esophageal cancer Neg Hx    Rectal cancer Neg Hx    Stomach cancer Neg Hx     Past Surgical History:  Procedure Laterality Date   COLONOSCOPY  01/06/2016   at [facility]   EVALUATION UNDER ANESTHESIA WITH HEMORRHOIDECTOMY N/A 06/28/2017   Procedure: EXAM UNDER ANESTHESIA WITH EXTERNAL HEMORRHOIDECTOMY AND REPAIR OF PERIRECTAL FISTULA;  Surgeon: Sheldon Standing, MD;  Location: [facility];  Service: General;  Laterality: N/A;   HERNIA REPAIR  8-10 yrs ago   umbilical hernia   INCISION AND DRAINAGE PERIRECTAL ABSCESS N/A 04/18/2017   Procedure: IRRIGATION AND DEBRIDEMENT PERIRECTAL ABSCESS, placement of seton, sigmoidoscopy;  Surgeon: Mikell Katz, MD;  Location: [facility];  Service: General;  Laterality: N/A;   LAPAROSCOPIC APPENDECTOMY     POLYPECTOMY     VAGINAL HYSTERECTOMY Bilateral    complete   WISDOM TOOTH EXTRACTION     Social History    Occupational History   Not on file  Tobacco Use   Smoking status: Former    Current packs/day: 1.00    Average packs/day: 1 pack/day for 30.0 years (30.0 ttl pk-yrs)    Types: Cigarettes   Smokeless tobacco: Never   Tobacco comments:    quit 2008  Vaping Use   Vaping status: Never Used  Substance and Sexual Activity   Alcohol use: Yes    Comment: once a month per pt   Drug use: No   Sexual activity: Yes    Partners: Male  Birth control/protection: Surgical

## 2024-09-27 ENCOUNTER — Other Ambulatory Visit (INDEPENDENT_AMBULATORY_CARE_PROVIDER_SITE_OTHER): Payer: Self-pay

## 2024-09-27 ENCOUNTER — Encounter: Payer: Self-pay | Admitting: Physical Medicine and Rehabilitation

## 2024-09-27 ENCOUNTER — Ambulatory Visit: Admitting: Physical Medicine and Rehabilitation

## 2024-09-27 DIAGNOSIS — M5442 Lumbago with sciatica, left side: Secondary | ICD-10-CM

## 2024-09-27 DIAGNOSIS — M5416 Radiculopathy, lumbar region: Secondary | ICD-10-CM

## 2024-09-27 DIAGNOSIS — G8929 Other chronic pain: Secondary | ICD-10-CM

## 2024-09-27 MED ORDER — TRAMADOL HCL 50 MG PO TABS: 50.0000 mg | ORAL_TABLET | Freq: Three times a day (TID) | ORAL | 0 refills | Status: DC | PRN

## 2024-09-27 NOTE — Progress Notes (Signed)
 "  Miranda Harris - 64 y.o. female MRN [id number]  Date of birth: [birth year]  Office Visit Note: Visit Date: 09/27/2024 PCP: Booker Darice SAUNDERS, FNP Referred by: Booker Darice SAUNDERS, FNP  Subjective: Chief Complaint  Patient presents with   Lower Back - Pain   HPI: Miranda Harris is a 64 y.o. female who comes in today per the request of Ronal Dragon Persons, PA for evaluation of chronic, worsening and severe bilateral lower back pain radiating to lateral hips and down left lateral leg. Left sided symptoms are most severe. Pain ongoing for several years, started in her early 20's. No specific aggravating factors that cause her pain to become severe. She describes pain as sore and aching sensation, currently rates as 8 out of 10. States her pain is most severe in the morning. Some relief of pain with home exercise regimen, rest and use of medications. She does take Tramadol  intermittent that is prescribed by Ronal Dragon. Prior lumbar MRI imaging from 2016 shows minimal degenerative disc disease at L4-5 and L5-S1. No neural  impingement. Benign stable hemangioma in the S1 segment of the sacrum. No history of lumbar surgery/injections. She recently underwent left greater trochanter injection with Ronal Dragon on 09/11/2024. She reports some relief of pain for about 1 week, however her pain has returned. Recent MRI imaging of left hip shows low-grade partial-thickness undersurface tear of the left gluteus medius cuff insertion with moderate tendinosis. Mild tendinosis of the left gluteus minimus cuff insertion. Mild osteoarthritis to left hip. Patient denies focal weakness, numbness and tingling. No recent trauma or falls.        Review of Systems  Musculoskeletal:  Positive for back pain.  Neurological:  Negative for tingling, sensory change, focal weakness and weakness.  All other systems reviewed and are negative.  Otherwise per HPI.  Assessment & Plan: Visit Diagnoses:    ICD-10-CM   1. Chronic bilateral low  back pain with left-sided sciatica  G89.29 MR LUMBAR SPINE WO CONTRAST   M54.42     2. Lumbar radiculopathy  M54.16 XR Lumbar Spine 2-3 Views    MR LUMBAR SPINE WO CONTRAST       Plan: Findings:  Chronic, worsening and severe bilateral lower back pain radiating to lateral hips and down left lateral leg. Patient continues to have severe pain despite good conservative therapies such as home exercise regimen, rest and use of medications. Patients clinical presentation and exam are coexistent with lumbar radiculopathy, more of L5 nerve pattern. I obtained lumbar radiographs in the office today that show right sided curvature, multi level degenerative changes, no spondylolisthesis. We discussed treatment plan in detail today. Next step is to obtain lumbar MRI imaging. Depending on results of MRI imaging we discussed possibility of performing lumbar epidural steroid injection. I did refill short course of Tramadol  for her today. She has no questions at this time. Her exam today is non focal, good strength noted to bilateral lower extremities.     Meds & Orders:  Meds ordered this encounter  Medications   traMADol  (ULTRAM ) 50 MG tablet    Sig: Take 1 tablet (50 mg total) by mouth every 8 (eight) hours as needed.    Dispense:  20 tablet    Refill:  0    Orders Placed This Encounter  Procedures   XR Lumbar Spine 2-3 Views   MR LUMBAR SPINE WO CONTRAST    Follow-up: Return for Lumbar MRI review.   Procedures: No procedures performed  Clinical History: No specialty comments available.   She reports that she has quit smoking. Her smoking use included cigarettes. She has a 30 pack-year smoking history. She has never used smokeless tobacco. No results for input(s): HGBA1C, LABURIC in the last 8760 hours.  Objective:  VS:  HT:    WT:   BMI:     BP:   HR: bpm  TEMP: ( )  RESP:  Physical Exam Vitals and nursing note reviewed.  HENT:     Head: Normocephalic and atraumatic.      Right Ear: External ear normal.     Left Ear: External ear normal.     Nose: Nose normal.     Mouth/Throat:     Mouth: Mucous membranes are moist.  Eyes:     Extraocular Movements: Extraocular movements intact.  Cardiovascular:     Rate and Rhythm: Normal rate.     Pulses: Normal pulses.  Pulmonary:     Effort: Pulmonary effort is normal.  Abdominal:     General: Abdomen is flat. There is no distension.  Musculoskeletal:        General: Tenderness present.     Cervical back: Normal range of motion.     Comments: Patient rises from seated position to standing without difficulty. Good lumbar range of motion. No pain noted with facet loading. 5/5 strength noted with bilateral hip flexion, knee flexion/extension, ankle dorsiflexion/plantarflexion and EHL. No clonus noted bilaterally. Mild tenderness upon palpation of left greater trochanter region. No pain with internal/external rotation of bilateral hips. Sensation intact bilaterally. Dysesthesias noted to left L5 dermatome. Negative slump test bilaterally. Ambulates without aid, gait steady.     Skin:    General: Skin is warm and dry.     Capillary Refill: Capillary refill takes less than 2 seconds.  Neurological:     General: No focal deficit present.     Mental Status: She is alert and oriented to person, place, and time.  Psychiatric:        Mood and Affect: Mood normal.        Behavior: Behavior normal.     Ortho Exam  Imaging: No results found.   Past Medical/Family/Surgical/Social History: Medications & Allergies reviewed per EMR, new medications updated. Patient Active Problem List   Diagnosis Date Noted   Establishing care with new doctor, encounter for 07/15/2024   Chronic midline thoracic back pain 07/15/2024   Vitamin D  deficiency 09/04/2020   Left carpal tunnel syndrome 11/22/2018   CKD (chronic kidney disease) stage 3, GFR 30-59 ml/min (HCC) 11/09/2018   Daytime somnolence 06/20/2018   Intersphincteric fistula  s/p LIFT repair 06/28/2017 06/28/2017   Bipolar I disorder, most recent episode depressed (HCC) 06/22/2016   Trochanteric bursitis of right hip 05/04/2016   Bursitis, trochanteric 04/19/2015   H/O bone density study 12/10/2014   History of Helicobacter pylori infection 12/10/2014   Elevated serum creatinine 08/26/2014   Bipolar 1 disorder (HCC) 06/25/2014   Bilateral hip pain 06/25/2014   Adult hypothyroidism 06/25/2014   Fatigue 06/25/2014   Past Medical History:  Diagnosis Date   Bipolar disorder (HCC)    Blood transfusion without reported diagnosis    Chronic kidney disease    Depression    Elevated serum creatinine    related to lithium  use   Helicobacter pylori (H. pylori) infection    pt denies   Hypothyroidism    Perirectal fistula    Thyroid  disease    Trochanteric bursitis of right hip  Family History  Problem Relation Age of Onset   Anxiety disorder Brother    Breast cancer Neg Hx    Colon cancer Neg Hx    Colon polyps Neg Hx    Esophageal cancer Neg Hx    Rectal cancer Neg Hx    Stomach cancer Neg Hx    Past Surgical History:  Procedure Laterality Date   COLONOSCOPY  01/06/2016   at [facility]   EVALUATION UNDER ANESTHESIA WITH HEMORRHOIDECTOMY N/A 06/28/2017   Procedure: EXAM UNDER ANESTHESIA WITH EXTERNAL HEMORRHOIDECTOMY AND REPAIR OF PERIRECTAL FISTULA;  Surgeon: Sheldon Standing, MD;  Location: [facility];  Service: General;  Laterality: N/A;   HERNIA REPAIR  8-10 yrs ago   umbilical hernia   INCISION AND DRAINAGE PERIRECTAL ABSCESS N/A 04/18/2017   Procedure: IRRIGATION AND DEBRIDEMENT PERIRECTAL ABSCESS, placement of seton, sigmoidoscopy;  Surgeon: Mikell Katz, MD;  Location: [facility];  Service: General;  Laterality: N/A;   LAPAROSCOPIC APPENDECTOMY     POLYPECTOMY     VAGINAL HYSTERECTOMY Bilateral    complete   WISDOM TOOTH EXTRACTION     Social History   Occupational History   Not on file  Tobacco Use   Smoking status: Former     Current packs/day: 1.00    Average packs/day: 1 pack/day for 30.0 years (30.0 ttl pk-yrs)    Types: Cigarettes   Smokeless tobacco: Never   Tobacco comments:    quit 2008  Vaping Use   Vaping status: Never Used  Substance and Sexual Activity   Alcohol use: Yes    Comment: once a month per pt   Drug use: No   Sexual activity: Yes    Partners: Male    Birth control/protection: Surgical    "

## 2024-09-27 NOTE — Progress Notes (Signed)
 Pain Scale   Average Pain 4 Patient advising she has lower back pain radiating to both legs and left leg is worse.        +Driver, -BT, -Dye Allergies.

## 2024-10-01 ENCOUNTER — Other Ambulatory Visit

## 2024-10-02 ENCOUNTER — Encounter: Admitting: Physical Medicine and Rehabilitation

## 2024-10-07 ENCOUNTER — Encounter: Payer: Self-pay | Admitting: Physical Medicine and Rehabilitation

## 2024-10-12 NOTE — Progress Notes (Unsigned)
 Virtual Visit via Video Note  I connected with Miranda Harris on 10/18/24 at 11:30 AM EST by a video enabled telemedicine application and verified that I am speaking with the correct person using two identifiers.  Location: Patient: home Provider: home office Persons participated in the visit- patient, provider    I discussed the limitations of evaluation and management by telemedicine and the availability of in person appointments. The patient expressed understanding and agreed to proceed.    I discussed the assessment and treatment plan with the patient. The patient was provided an opportunity to ask questions and all were answered. The patient agreed with the plan and demonstrated an understanding of the instructions.   The patient was advised to call back or seek an in-person evaluation if the symptoms worsen or if the condition fails to improve as anticipated.   [name], MD    [facility] MD/PA/NP OP Progress Note  10/18/2024 12:00 PM Miranda Harris  MRN:  [id number]  Chief Complaint:  Chief Complaint  Patient presents with   Follow-up   HPI:  This is a follow-up appointment for bipolar disorder and insomnia.  She states that things has been going good.  She was found to have curvature in the spine and will have MRI.  She feels stiff.  Things has been stressful in the last few months.  Her husband is working second shift.  She also reports loss of her good friend, who was a teaching laboratory technician at [employer] on Christmas Day.  However, she states that it is all over and she denies much concern.  She has been doing teller work, 4 times a month, and has volunteered for things.  She likes doing this.  Her mood has been good except that she tends to feel overwhelmed and have some negative thoughts when she is by herself at night.  She tries to watch a movie and try to relax.  It is usually gone the next day, and she denies feeling depressed as she used to since taking vilazodone .  She sleeps  7 hours.  She tends to have less appetite.  She denies SI, hallucinations.  She denies decreased need for sleep or euphoria.  She agrees with the plans as outlined below.   183 lbs Wt Readings from Last 3 Encounters:  07/15/24 192 lb (87.1 kg)  05/17/24 189 lb (85.7 kg)  07/12/21 192 lb 12.8 oz (87.5 kg)     Substance use   Tobacco Alcohol Other substances/  Current   Denies- cannot drink denies  Past   Used to drink a good amount when she was in [employer], cocaine, occasionally when she was in college, high school  Past Treatment           Visit Diagnosis:    ICD-10-CM   1. Bipolar 1 disorder (HCC)  F31.9     2. Insomnia, unspecified type  G47.00       Past Psychiatric History: Please see initial evaluation for full details. I have reviewed the history. No updates at this time.     Past Medical History:  Past Medical History:  Diagnosis Date   Bipolar disorder (HCC)    Blood transfusion without reported diagnosis    Chronic kidney disease    Depression    Elevated serum creatinine    related to lithium  use   Helicobacter pylori (H. pylori) infection    pt denies   Hypothyroidism    Perirectal fistula    Thyroid  disease  Trochanteric bursitis of right hip     Past Surgical History:  Procedure Laterality Date   COLONOSCOPY  01/06/2016   at [facility]   EVALUATION UNDER ANESTHESIA WITH HEMORRHOIDECTOMY N/A 06/28/2017   Procedure: EXAM UNDER ANESTHESIA WITH EXTERNAL HEMORRHOIDECTOMY AND REPAIR OF PERIRECTAL FISTULA;  Surgeon: Sheldon Standing, MD;  Location: [facility];  Service: General;  Laterality: N/A;   HERNIA REPAIR  8-10 yrs ago   umbilical hernia   INCISION AND DRAINAGE PERIRECTAL ABSCESS N/A 04/18/2017   Procedure: IRRIGATION AND DEBRIDEMENT PERIRECTAL ABSCESS, placement of seton, sigmoidoscopy;  Surgeon: Mikell Katz, MD;  Location: [facility];  Service: General;  Laterality: N/A;   LAPAROSCOPIC APPENDECTOMY     POLYPECTOMY     VAGINAL  HYSTERECTOMY Bilateral    complete   WISDOM TOOTH EXTRACTION      Family Psychiatric History: Please see initial evaluation for full details. I have reviewed the history. No updates at this time.     Family History:  Family History  Problem Relation Age of Onset   Anxiety disorder Brother    Breast cancer Neg Hx    Colon cancer Neg Hx    Colon polyps Neg Hx    Esophageal cancer Neg Hx    Rectal cancer Neg Hx    Stomach cancer Neg Hx     Social History:  Social History   Socioeconomic History   Marital status: Married    Spouse name: Not on file   Number of children: Not on file   Years of education: Not on file   Highest education level: Not on file  Occupational History   Not on file  Tobacco Use   Smoking status: Former    Current packs/day: 1.00    Average packs/day: 1 pack/day for 30.0 years (30.0 ttl pk-yrs)    Types: Cigarettes   Smokeless tobacco: Never   Tobacco comments:    quit 2008  Vaping Use   Vaping status: Never Used  Substance and Sexual Activity   Alcohol use: Yes    Comment: once a month per pt   Drug use: No   Sexual activity: Yes    Partners: Male    Birth control/protection: Surgical  Other Topics Concern   Not on file  Social History Narrative   Not on file   Social Drivers of Health   Tobacco Use: Medium Risk (09/27/2024)   Patient History    Smoking Tobacco Use: Former    Smokeless Tobacco Use: Never    Passive Exposure: Not on file  Financial Resource Strain: Low Risk (05/14/2024)   Received from [facility]   Overall Financial Resource Strain (CARDIA)    How hard is it for you to pay for the very basics like food, housing, medical care, and heating?: Not hard at all  Food Insecurity: No Food Insecurity (05/14/2024)   Received from [facility]   Epic    Within the past 12 months, you worried that your food would run out before you got the money to buy more.: Never true    Within the past 12 months, the food you bought just  didn't last and you didn't have money to get more.: Never true  Transportation Needs: No Transportation Needs (05/14/2024)   Received from [facility]    In the past 12 months, has lack of transportation kept you from medical appointments or from getting medications?: No    In the past 12 months, has lack of transportation  kept you from meetings, work, or from getting things needed for daily living?: No  Physical Activity: Insufficiently Active (05/14/2024)   Received from [facility]   Exercise Vital Sign    On average, how many days per week do you engage in moderate to strenuous exercise (like a brisk walk)?: 4 days    On average, how many minutes do you engage in exercise at this level?: 30 min  Stress: Stress Concern Present (05/14/2024)   Received from [facility] - Occupational Stress Questionnaire    Do you feel stress - tense, restless, nervous, or anxious, or unable to sleep at night because your mind is troubled all the time - these days?: To some extent  Social Connections: Socially Integrated (05/14/2024)   Received from [facility]   Social Network    How would you rate your social network (family, work, friends)?: Good participation with social networks  Depression (PHQ2-9): Low Risk (10/12/2021)   Depression (PHQ2-9)    PHQ-2 Score: 0  Alcohol Screen: Not on file  Housing: Low Risk (05/14/2024)   Received from [facility]    In the last 12 months, was there a time when you were not able to pay the mortgage or rent on time?: No    In the past 12 months, how many times have you moved where you were living?: 0    At any time in the past 12 months, were you homeless or living in a shelter (including now)?: No  Utilities: Not At Risk (05/14/2024)   Received from [facility]    In the past 12 months has the electric, gas, oil, or water company threatened to shut off services in your home?: No  Health  Literacy: Not on file    Allergies: Allergies[1]  Metabolic Disorder Labs: Lab Results  Component Value Date   HGBA1C 5.5 09/04/2020   MPG 108 06/10/2015   No results found for: PROLACTIN Lab Results  Component Value Date   CHOL 205 (H) 12/25/2020   TRIG 76.0 12/25/2020   HDL 73.30 12/25/2020   CHOLHDL 3 12/25/2020   VLDL 15.2 12/25/2020   LDLCALC 116 (H) 12/25/2020   LDLCALC 122 (H) 09/04/2020   Lab Results  Component Value Date   TSH 0.191 (L) 07/15/2024   TSH 0.15 (A) 10/04/2023    Therapeutic Level Labs: Lab Results  Component Value Date   LITHIUM  1.2 11/08/2018   LITHIUM  0.80 06/10/2015   No results found for: VALPROATE No results found for: CBMZ  Current Medications: Current Outpatient Medications  Medication Sig Dispense Refill   Calcium Carbonate-Vitamin D  (CALTRATE 600+D PO) Take 2 tablets by mouth daily.     celecoxib  (CELEBREX ) 200 MG capsule Take 200 mg by mouth 2 (two) times daily.     lamoTRIgine  (LAMICTAL ) 25 MG tablet Take 2 tablets (50 mg total) by mouth in the morning, at noon, and at bedtime. (Patient taking differently: Take 25 mg by mouth in the morning, at noon, and at bedtime.) 540 tablet 0   levothyroxine  (SYNTHROID ) 125 MCG tablet TAKE 1 TABLET BY MOUTH EVERY DAY 90 tablet 0   Multiple Vitamin (MULTIVITAMIN WITH MINERALS) TABS tablet Take 1 tablet by mouth daily.     Omega-3 Fatty Acids (FISH OIL) 1000 MG CAPS Take 2,000 mg by mouth daily.     predniSONE  (STERAPRED UNI-PAK 21 TAB) 10 MG (21) TBPK tablet Take by mouth daily. Take 6  tabs by mouth daily  for 2 days, then 5 tabs for 2 days, then 4 tabs for 2 days, then 3 tabs for 2 days, 2 tabs for 2 days, then 1 tab by mouth daily for 2 days 42 tablet 0   QUEtiapine  (SEROQUEL ) 100 MG tablet Take 1 tablet (100 mg total) by mouth at bedtime. Total of 175 mg at night. Take along with 50 mg and 25 mg tab 90 tablet 1   QUEtiapine  (SEROQUEL ) 25 MG tablet Take 1 tablet (25 mg total) by mouth at  bedtime. Total of 175 mg at night. Take along with 100 mg, 50 mg tab 90 tablet 1   QUEtiapine  (SEROQUEL ) 50 MG tablet Take 1 tablet (50 mg total) by mouth at bedtime. Total of 175 mg at night. Take along with 100 mg and 50 mg tab 90 tablet 1   traMADol  (ULTRAM ) 50 MG tablet Take 1 tablet (50 mg total) by mouth every 8 (eight) hours as needed. 20 tablet 0   Vitamin D , Ergocalciferol , (DRISDOL ) 1.25 MG (50000 UNIT) CAPS capsule Take 1 capsule (50,000 Units total) by mouth every 7 (seven) days. 12 capsule 0   [START ON 11/07/2024] Vilazodone  HCl 20 MG TABS Take 1 tablet (20 mg total) by mouth daily. 90 tablet 0   No current facility-administered medications for this visit.     Musculoskeletal: Strength & Muscle Tone: N/A Gait & Station: N/A Patient leans: N/A  Psychiatric Specialty Exam: Review of Systems  Psychiatric/Behavioral:  Negative for agitation, behavioral problems, confusion, decreased concentration, dysphoric mood, hallucinations, self-injury, sleep disturbance and suicidal ideas. The patient is nervous/anxious. The patient is not hyperactive.   All other systems reviewed and are negative.   There were no vitals taken for this visit.There is no height or weight on file to calculate BMI.  General Appearance: Well Groomed  Eye Contact:  Good  Speech:  Clear and Coherent  Volume:  Normal  Mood:  good  Affect:  Appropriate, Congruent, and Full Range  Thought Process:  Coherent  Orientation:  Full (Time, Place, and Person)  Thought Content: Logical   Suicidal Thoughts:  No  Homicidal Thoughts:  No  Memory:  Immediate;   Good  Judgement:  Good  Insight:  Good  Psychomotor Activity:  Normal  Concentration:  Concentration: Good and Attention Span: Good  Recall:  Good  Fund of Knowledge: Good  Language: Good  Akathisia:  No  Handed:  Right  AIMS (if indicated): not done  Assets:  Communication Skills Desire for Improvement  ADL's:  Intact  Cognition: WNL  Sleep:  Fair    Screenings: PHQ2-9    Flowsheet Row Video Visit from 10/12/2021 in [facility] Office Visit from 07/12/2021 in [facility] Video Visit from 02/11/2021 in [facility] Video Visit from 11/12/2020 in [facility] Office Visit from 12/13/2019 in [facility] [place]  PHQ-2 Total Score 0 4 0 0 0  PHQ-9 Total Score -- 11 -- -- 6   Flowsheet Row UC from 06/06/2024 in [facility] [place] UC from 05/17/2024 in [facility] Video Visit from 10/12/2021 in [facility]  C-SSRS RISK CATEGORY No Risk No Risk No Risk     Assessment and Plan:  Samanatha Brammer is a 64 year old female with a history of bipolar I disorder, 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   1. Bipolar 1 disorder (HCC) History: admitted at age 22 due to mania, psychosis, depression, another episode at age 59 due to non adherence. Originally on lithium  300 mg QID, trifluoperazine  2 mg qhs, lexapro  20 mg daily, xanax  0.5 mg qhsprn    Although she reports occasional negative thoughts when she is by herself at night, her mood has been overall stable since the previous visit.  She is actively engaged in church activities, and is working to process pain.  Will continue current medication regimen.  Will continue current dose of lamotrigine  and quetiapine  for bipolar disorder.  She is not interested in metformin due to adverse reaction other people experienced.  Will continue Viibryd  to target bipolar depression, off label.  She agrees to have in person visit to monitor any adverse reaction from quetiapine .   2. Insomnia, unspecified type Overall stable.  Will  continue current dose of quetiapine  to target insomnia.    # high risk medication use     Last checked  EKG HR 64, QTc 389 msec 10/2022  Lipid panels LDL 120 H 01/2024  HbA1c Glu 71 01/2024      Plan:   Continue lamotrigine  25 mg three times a day (50 mg caused drowsiness.  up 01/2024) Continue quetiapine  175 mg at night   Continue vilazodone  20 mg at night Next appointment: 3/16 at 3 pm, IP - vitamin D  was within limits 2023    Past trials of medication: Sertraline, fluoxetine  (drowsiness), Lexapro  (nausea, brain fog),  Duloxetine, venlafaxine  (initially worked well, stopped working, hair loss), bupropion (insomnia),  Paxil  (urinary retention), Latuda , Abilify (flu like symptoms), Haldol (EPS), Thorazine (EPS), Stelazine ,  Lithium , Lunesta (sedation), Ambien (sleepwalking, driving, eating), Trazodone  (excessive sedation), Xanax     The patient demonstrates the following  risk factors for suicide: Chronic risk factors for suicide include psychiatric disorder /bipolar disorder, history of substance use disorder, family history of suicide attempt. Acute risk factors for suicide include none. Protective factors for this patient include positive social support, responsibility to others (family), coping skills, hope for the future. The overall suicide risk at this point appears to be low, and she is appropriate for outpatient follow up.       Collaboration of Care: Collaboration of Care: Other reviewed notes in Epic  Patient/Guardian was advised Release of Information must be obtained prior to any record release in order to collaborate their care with an outside provider. Patient/Guardian was advised if they have not already done so to contact the registration department to sign all necessary forms in order for us  to release information regarding their care.   Consent: Patient/Guardian gives verbal consent for treatment and assignment of benefits for services provided during this visit.  Patient/Guardian expressed understanding and agreed to proceed.    [name], MD 10/18/2024, 12:00 PM     [1]  Allergies Allergen Reactions   Metrogel  [Metronidazole ] Other (See Comments)    Burn face   Tetracyclines & Related Rash

## 2024-10-15 ENCOUNTER — Other Ambulatory Visit

## 2024-10-18 ENCOUNTER — Encounter: Payer: Self-pay | Admitting: Psychiatry

## 2024-10-18 ENCOUNTER — Telehealth: Admitting: Psychiatry

## 2024-10-18 DIAGNOSIS — F319 Bipolar disorder, unspecified: Secondary | ICD-10-CM

## 2024-10-18 DIAGNOSIS — G4700 Insomnia, unspecified: Secondary | ICD-10-CM | POA: Diagnosis not present

## 2024-10-18 MED ORDER — VILAZODONE HCL 20 MG PO TABS: 20.0000 mg | ORAL_TABLET | Freq: Every day | ORAL | 0 refills | Status: AC

## 2024-10-18 NOTE — Patient Instructions (Signed)
 Continue lamotrigine  25 mg three times a day Continue quetiapine  175 mg at night   Continue vilazodone  20 mg at night Next appointment: 3/16 at 3 pm

## 2024-10-27 ENCOUNTER — Other Ambulatory Visit

## 2024-10-29 ENCOUNTER — Other Ambulatory Visit: Payer: Self-pay | Admitting: Physical Medicine and Rehabilitation

## 2024-10-29 MED ORDER — TRAMADOL HCL 50 MG PO TABS: 50.0000 mg | ORAL_TABLET | Freq: Three times a day (TID) | ORAL | 0 refills | Status: AC | PRN

## 2024-11-07 ENCOUNTER — Other Ambulatory Visit

## 2024-12-09 ENCOUNTER — Ambulatory Visit: Admitting: Psychiatry
